# Patient Record
Sex: Female | Born: 1950 | Race: White | Hispanic: No | Smoking: Former smoker
Health system: Southern US, Community
[De-identification: ages and names within clinical notes are randomized; demographics above are authoritative.]

## PROBLEM LIST (undated history)

## (undated) DIAGNOSIS — M1990 Unspecified osteoarthritis, unspecified site: Secondary | ICD-10-CM

## (undated) DIAGNOSIS — E119 Type 2 diabetes mellitus without complications: Secondary | ICD-10-CM

## (undated) DIAGNOSIS — R011 Cardiac murmur, unspecified: Secondary | ICD-10-CM

## (undated) DIAGNOSIS — F419 Anxiety disorder, unspecified: Secondary | ICD-10-CM

## (undated) DIAGNOSIS — E079 Disorder of thyroid, unspecified: Secondary | ICD-10-CM

## (undated) DIAGNOSIS — K5900 Constipation, unspecified: Secondary | ICD-10-CM

## (undated) DIAGNOSIS — R7303 Prediabetes: Secondary | ICD-10-CM

## (undated) DIAGNOSIS — G4730 Sleep apnea, unspecified: Secondary | ICD-10-CM

## (undated) DIAGNOSIS — T63301A Toxic effect of unspecified spider venom, accidental (unintentional), initial encounter: Secondary | ICD-10-CM

## (undated) DIAGNOSIS — F32A Depression, unspecified: Secondary | ICD-10-CM

## (undated) DIAGNOSIS — I10 Essential (primary) hypertension: Secondary | ICD-10-CM

## (undated) DIAGNOSIS — M549 Dorsalgia, unspecified: Secondary | ICD-10-CM

## (undated) DIAGNOSIS — R1310 Dysphagia, unspecified: Secondary | ICD-10-CM

## (undated) DIAGNOSIS — R609 Edema, unspecified: Secondary | ICD-10-CM

## (undated) DIAGNOSIS — K829 Disease of gallbladder, unspecified: Secondary | ICD-10-CM

## (undated) DIAGNOSIS — E785 Hyperlipidemia, unspecified: Secondary | ICD-10-CM

## (undated) DIAGNOSIS — R933 Abnormal findings on diagnostic imaging of other parts of digestive tract: Secondary | ICD-10-CM

## (undated) DIAGNOSIS — F329 Major depressive disorder, single episode, unspecified: Secondary | ICD-10-CM

## (undated) HISTORY — DX: Major depressive disorder, single episode, unspecified: F32.9

## (undated) HISTORY — DX: Edema, unspecified: R60.9

## (undated) HISTORY — DX: Dorsalgia, unspecified: M54.9

## (undated) HISTORY — DX: Sleep apnea, unspecified: G47.30

## (undated) HISTORY — DX: Prediabetes: R73.03

## (undated) HISTORY — DX: Toxic effect of unspecified spider venom, accidental (unintentional), initial encounter: T63.301A

## (undated) HISTORY — PX: OTHER SURGICAL HISTORY: SHX169

## (undated) HISTORY — DX: Hyperlipidemia, unspecified: E78.5

## (undated) HISTORY — DX: Anxiety disorder, unspecified: F41.9

## (undated) HISTORY — DX: Unspecified osteoarthritis, unspecified site: M19.90

## (undated) HISTORY — DX: Type 2 diabetes mellitus without complications: E11.9

## (undated) HISTORY — DX: Disorder of thyroid, unspecified: E07.9

## (undated) HISTORY — DX: Dysphagia, unspecified: R13.10

## (undated) HISTORY — DX: Disease of gallbladder, unspecified: K82.9

## (undated) HISTORY — PX: TONSILLECTOMY: SUR1361

## (undated) HISTORY — DX: Constipation, unspecified: K59.00

## (undated) HISTORY — DX: Essential (primary) hypertension: I10

## (undated) HISTORY — DX: Depression, unspecified: F32.A

## (undated) HISTORY — DX: Cardiac murmur, unspecified: R01.1

## (undated) HISTORY — DX: Abnormal findings on diagnostic imaging of other parts of digestive tract: R93.3

## (undated) HISTORY — PX: DILATION AND CURETTAGE OF UTERUS: SHX78

## (undated) HISTORY — PX: CHOLECYSTECTOMY: SHX55

---

## 1998-04-28 ENCOUNTER — Other Ambulatory Visit: Admission: RE | Admit: 1998-04-28 | Discharge: 1998-04-28 | Payer: Self-pay | Admitting: *Deleted

## 1999-02-10 ENCOUNTER — Encounter: Admission: RE | Admit: 1999-02-10 | Discharge: 1999-02-10 | Payer: Self-pay | Admitting: *Deleted

## 1999-06-29 ENCOUNTER — Other Ambulatory Visit: Admission: RE | Admit: 1999-06-29 | Discharge: 1999-06-29 | Payer: Self-pay | Admitting: *Deleted

## 2000-07-03 ENCOUNTER — Other Ambulatory Visit: Admission: RE | Admit: 2000-07-03 | Discharge: 2000-07-03 | Payer: Self-pay | Admitting: *Deleted

## 2000-07-30 ENCOUNTER — Encounter: Admission: RE | Admit: 2000-07-30 | Discharge: 2000-07-30 | Payer: Self-pay | Admitting: *Deleted

## 2001-02-27 ENCOUNTER — Ambulatory Visit (HOSPITAL_COMMUNITY): Admission: RE | Admit: 2001-02-27 | Discharge: 2001-02-27 | Payer: Self-pay | Admitting: *Deleted

## 2001-02-27 ENCOUNTER — Encounter (INDEPENDENT_AMBULATORY_CARE_PROVIDER_SITE_OTHER): Payer: Self-pay | Admitting: Specialist

## 2001-07-18 ENCOUNTER — Other Ambulatory Visit: Admission: RE | Admit: 2001-07-18 | Discharge: 2001-07-18 | Payer: Self-pay | Admitting: *Deleted

## 2001-08-06 ENCOUNTER — Encounter: Admission: RE | Admit: 2001-08-06 | Discharge: 2001-08-06 | Payer: Self-pay | Admitting: *Deleted

## 2002-09-03 ENCOUNTER — Encounter: Admission: RE | Admit: 2002-09-03 | Discharge: 2002-09-03 | Payer: Self-pay | Admitting: Internal Medicine

## 2002-09-03 ENCOUNTER — Encounter: Payer: Self-pay | Admitting: Internal Medicine

## 2004-10-20 ENCOUNTER — Other Ambulatory Visit: Admission: RE | Admit: 2004-10-20 | Discharge: 2004-10-20 | Payer: Self-pay | Admitting: Family Medicine

## 2004-10-20 ENCOUNTER — Ambulatory Visit: Payer: Self-pay | Admitting: Family Medicine

## 2004-11-15 ENCOUNTER — Ambulatory Visit: Payer: Self-pay | Admitting: Internal Medicine

## 2005-01-23 ENCOUNTER — Ambulatory Visit: Payer: Self-pay | Admitting: Family Medicine

## 2005-07-17 ENCOUNTER — Ambulatory Visit: Payer: Self-pay | Admitting: Family Medicine

## 2005-07-23 ENCOUNTER — Ambulatory Visit: Payer: Self-pay | Admitting: Family Medicine

## 2005-10-03 ENCOUNTER — Other Ambulatory Visit: Admission: RE | Admit: 2005-10-03 | Discharge: 2005-10-03 | Payer: Self-pay | Admitting: Family Medicine

## 2005-10-03 ENCOUNTER — Encounter: Payer: Self-pay | Admitting: Family Medicine

## 2005-10-03 ENCOUNTER — Ambulatory Visit: Payer: Self-pay | Admitting: Family Medicine

## 2006-04-17 ENCOUNTER — Ambulatory Visit: Payer: Self-pay | Admitting: Family Medicine

## 2006-05-14 ENCOUNTER — Ambulatory Visit: Payer: Self-pay | Admitting: Family Medicine

## 2006-05-14 LAB — CONVERTED CEMR LAB
Basophils Absolute: 0.1 10*3/uL (ref 0.0–0.1)
Basophils Relative: 1.1 % — ABNORMAL HIGH (ref 0.0–1.0)
CO2: 32 meq/L (ref 19–32)
Chloride: 103 meq/L (ref 96–112)
Creatinine, Ser: 0.7 mg/dL (ref 0.4–1.2)
Eosinophils Relative: 4.8 % (ref 0.0–5.0)
HCT: 40.7 % (ref 36.0–46.0)
Hemoglobin: 14 g/dL (ref 12.0–15.0)
MCHC: 34.5 g/dL (ref 30.0–36.0)
Monocytes Absolute: 0.3 10*3/uL (ref 0.2–0.7)
Neutrophils Relative %: 50.8 % (ref 43.0–77.0)
Potassium: 4.4 meq/L (ref 3.5–5.1)
RBC: 4.55 M/uL (ref 3.87–5.11)
RDW: 12.5 % (ref 11.5–14.6)
Sodium: 141 meq/L (ref 135–145)
TSH: 8.84 microintl units/mL — ABNORMAL HIGH (ref 0.35–5.50)
WBC: 5.6 10*3/uL (ref 4.5–10.5)

## 2006-05-23 ENCOUNTER — Encounter: Payer: Self-pay | Admitting: Family Medicine

## 2006-05-23 ENCOUNTER — Ambulatory Visit: Payer: Self-pay | Admitting: Family Medicine

## 2006-05-23 DIAGNOSIS — R42 Dizziness and giddiness: Secondary | ICD-10-CM | POA: Insufficient documentation

## 2006-05-23 DIAGNOSIS — E039 Hypothyroidism, unspecified: Secondary | ICD-10-CM

## 2006-05-23 DIAGNOSIS — G4733 Obstructive sleep apnea (adult) (pediatric): Secondary | ICD-10-CM

## 2006-05-23 HISTORY — DX: Dizziness and giddiness: R42

## 2006-05-23 HISTORY — DX: Hypothyroidism, unspecified: E03.9

## 2006-05-23 HISTORY — DX: Obstructive sleep apnea (adult) (pediatric): G47.33

## 2006-05-29 ENCOUNTER — Encounter: Admission: RE | Admit: 2006-05-29 | Discharge: 2006-05-29 | Payer: Self-pay | Admitting: Family Medicine

## 2006-06-12 ENCOUNTER — Encounter: Admission: RE | Admit: 2006-06-12 | Discharge: 2006-07-30 | Payer: Self-pay | Admitting: Orthopedic Surgery

## 2006-09-05 ENCOUNTER — Ambulatory Visit: Payer: Self-pay | Admitting: Family Medicine

## 2006-09-05 DIAGNOSIS — F329 Major depressive disorder, single episode, unspecified: Secondary | ICD-10-CM | POA: Insufficient documentation

## 2006-09-05 DIAGNOSIS — F3289 Other specified depressive episodes: Secondary | ICD-10-CM | POA: Insufficient documentation

## 2006-09-05 HISTORY — DX: Other specified depressive episodes: F32.89

## 2006-09-30 ENCOUNTER — Ambulatory Visit: Payer: Self-pay | Admitting: Gastroenterology

## 2007-04-03 ENCOUNTER — Ambulatory Visit: Payer: Self-pay | Admitting: Family Medicine

## 2007-04-03 ENCOUNTER — Other Ambulatory Visit: Admission: RE | Admit: 2007-04-03 | Discharge: 2007-04-03 | Payer: Self-pay | Admitting: Family Medicine

## 2007-04-03 ENCOUNTER — Encounter: Payer: Self-pay | Admitting: Family Medicine

## 2007-04-04 ENCOUNTER — Encounter: Payer: Self-pay | Admitting: Family Medicine

## 2007-04-14 ENCOUNTER — Encounter (INDEPENDENT_AMBULATORY_CARE_PROVIDER_SITE_OTHER): Payer: Self-pay | Admitting: *Deleted

## 2007-05-16 ENCOUNTER — Emergency Department (HOSPITAL_COMMUNITY): Admission: EM | Admit: 2007-05-16 | Discharge: 2007-05-16 | Payer: Self-pay | Admitting: Family Medicine

## 2007-05-27 ENCOUNTER — Encounter: Payer: Self-pay | Admitting: Family Medicine

## 2007-06-02 ENCOUNTER — Telehealth (INDEPENDENT_AMBULATORY_CARE_PROVIDER_SITE_OTHER): Payer: Self-pay | Admitting: *Deleted

## 2007-07-18 ENCOUNTER — Ambulatory Visit: Payer: Self-pay | Admitting: Family Medicine

## 2007-07-27 LAB — CONVERTED CEMR LAB
ALT: 25 U/L
AST: 31 U/L
Albumin: 3.8 g/dL
Alkaline Phosphatase: 70 U/L
BUN: 11 mg/dL
Bilirubin, Direct: 0.1 mg/dL
CO2: 32 meq/L
Calcium: 9 mg/dL
Chloride: 99 meq/L
Cholesterol: 249 mg/dL
Creatinine, Ser: 0.8 mg/dL
Direct LDL: 173.9 mg/dL
GFR calc Af Amer: 95 mL/min
GFR calc non Af Amer: 79 mL/min
Glucose, Bld: 122 mg/dL — ABNORMAL HIGH
HDL: 27.4 mg/dL — ABNORMAL LOW
Hgb A1c MFr Bld: 6.3 % — ABNORMAL HIGH
Potassium: 3.3 meq/L — ABNORMAL LOW
Sodium: 138 meq/L
Total Bilirubin: 0.7 mg/dL
Total CHOL/HDL Ratio: 9.1
Total Protein: 6.9 g/dL
Triglycerides: 199 mg/dL — ABNORMAL HIGH
VLDL: 40 mg/dL

## 2007-07-28 ENCOUNTER — Encounter (INDEPENDENT_AMBULATORY_CARE_PROVIDER_SITE_OTHER): Payer: Self-pay | Admitting: *Deleted

## 2007-09-03 ENCOUNTER — Encounter: Payer: Self-pay | Admitting: Family Medicine

## 2007-10-14 ENCOUNTER — Telehealth (INDEPENDENT_AMBULATORY_CARE_PROVIDER_SITE_OTHER): Payer: Self-pay | Admitting: *Deleted

## 2008-01-05 ENCOUNTER — Telehealth (INDEPENDENT_AMBULATORY_CARE_PROVIDER_SITE_OTHER): Payer: Self-pay | Admitting: *Deleted

## 2008-03-30 ENCOUNTER — Ambulatory Visit: Payer: Self-pay | Admitting: Family Medicine

## 2008-03-30 HISTORY — DX: Morbid (severe) obesity due to excess calories: E66.01

## 2008-04-07 ENCOUNTER — Telehealth (INDEPENDENT_AMBULATORY_CARE_PROVIDER_SITE_OTHER): Payer: Self-pay | Admitting: *Deleted

## 2008-04-12 ENCOUNTER — Encounter (INDEPENDENT_AMBULATORY_CARE_PROVIDER_SITE_OTHER): Payer: Self-pay | Admitting: *Deleted

## 2008-04-27 ENCOUNTER — Other Ambulatory Visit: Admission: RE | Admit: 2008-04-27 | Discharge: 2008-04-27 | Payer: Self-pay | Admitting: Family Medicine

## 2008-04-27 ENCOUNTER — Ambulatory Visit: Payer: Self-pay | Admitting: Family Medicine

## 2008-04-27 ENCOUNTER — Encounter: Payer: Self-pay | Admitting: Family Medicine

## 2008-04-29 ENCOUNTER — Encounter (INDEPENDENT_AMBULATORY_CARE_PROVIDER_SITE_OTHER): Payer: Self-pay | Admitting: *Deleted

## 2008-05-01 ENCOUNTER — Encounter: Payer: Self-pay | Admitting: Family Medicine

## 2008-05-03 ENCOUNTER — Ambulatory Visit: Payer: Self-pay | Admitting: Gastroenterology

## 2008-05-04 ENCOUNTER — Telehealth (INDEPENDENT_AMBULATORY_CARE_PROVIDER_SITE_OTHER): Payer: Self-pay | Admitting: *Deleted

## 2008-05-04 ENCOUNTER — Encounter (INDEPENDENT_AMBULATORY_CARE_PROVIDER_SITE_OTHER): Payer: Self-pay | Admitting: *Deleted

## 2008-06-16 ENCOUNTER — Encounter: Payer: Self-pay | Admitting: Family Medicine

## 2008-06-25 ENCOUNTER — Ambulatory Visit (HOSPITAL_COMMUNITY): Admission: RE | Admit: 2008-06-25 | Discharge: 2008-06-25 | Payer: Self-pay | Admitting: Surgery

## 2008-07-07 ENCOUNTER — Encounter: Admission: RE | Admit: 2008-07-07 | Discharge: 2008-09-30 | Payer: Self-pay | Admitting: Surgery

## 2008-07-08 ENCOUNTER — Encounter: Payer: Self-pay | Admitting: Family Medicine

## 2008-07-28 ENCOUNTER — Encounter: Payer: Self-pay | Admitting: Family Medicine

## 2008-10-05 ENCOUNTER — Ambulatory Visit: Payer: Self-pay | Admitting: Family Medicine

## 2008-11-01 ENCOUNTER — Ambulatory Visit (HOSPITAL_COMMUNITY): Admission: RE | Admit: 2008-11-01 | Discharge: 2008-11-02 | Payer: Self-pay | Admitting: Surgery

## 2008-11-15 ENCOUNTER — Encounter: Admission: RE | Admit: 2008-11-15 | Discharge: 2009-02-13 | Payer: Self-pay | Admitting: Surgery

## 2009-03-08 ENCOUNTER — Encounter: Payer: Self-pay | Admitting: Family Medicine

## 2009-04-01 ENCOUNTER — Encounter: Payer: Self-pay | Admitting: Family Medicine

## 2009-04-26 ENCOUNTER — Encounter: Admission: RE | Admit: 2009-04-26 | Discharge: 2009-04-26 | Payer: Self-pay | Admitting: Surgery

## 2009-05-17 ENCOUNTER — Ambulatory Visit: Payer: Self-pay | Admitting: Family Medicine

## 2009-05-25 ENCOUNTER — Telehealth: Payer: Self-pay | Admitting: Family Medicine

## 2009-06-01 ENCOUNTER — Ambulatory Visit: Payer: Self-pay | Admitting: Family Medicine

## 2009-06-01 DIAGNOSIS — R5383 Other fatigue: Secondary | ICD-10-CM

## 2009-06-01 DIAGNOSIS — R5381 Other malaise: Secondary | ICD-10-CM

## 2009-06-01 HISTORY — DX: Other malaise: R53.81

## 2009-06-02 ENCOUNTER — Encounter: Payer: Self-pay | Admitting: Family Medicine

## 2009-06-06 LAB — CONVERTED CEMR LAB
BUN: 13 mg/dL (ref 6–23)
Basophils Absolute: 0 10*3/uL (ref 0.0–0.1)
Chloride: 103 meq/L (ref 96–112)
Creatinine, Ser: 0.6 mg/dL (ref 0.4–1.2)
Eosinophils Absolute: 0.1 10*3/uL (ref 0.0–0.7)
Eosinophils Relative: 1.8 % (ref 0.0–5.0)
Glucose, Bld: 101 mg/dL — ABNORMAL HIGH (ref 70–99)
MCV: 89.2 fL (ref 78.0–100.0)
Monocytes Absolute: 0.4 10*3/uL (ref 0.1–1.0)
Neutrophils Relative %: 72.1 % (ref 43.0–77.0)
Platelets: 240 10*3/uL (ref 150.0–400.0)
RDW: 14.1 % (ref 11.5–14.6)
T3, Free: 2.5 pg/mL (ref 2.3–4.2)
TSH: 1.29 microintl units/mL (ref 0.35–5.50)
Vitamin B-12: 839 pg/mL (ref 211–911)
WBC: 7.8 10*3/uL (ref 4.5–10.5)

## 2009-07-27 ENCOUNTER — Encounter: Admission: RE | Admit: 2009-07-27 | Discharge: 2009-07-27 | Payer: Self-pay | Admitting: Surgery

## 2009-10-26 ENCOUNTER — Encounter: Admission: RE | Admit: 2009-10-26 | Discharge: 2009-11-18 | Payer: Self-pay | Admitting: Surgery

## 2009-11-10 ENCOUNTER — Encounter: Payer: Self-pay | Admitting: Family Medicine

## 2010-01-22 ENCOUNTER — Emergency Department (HOSPITAL_BASED_OUTPATIENT_CLINIC_OR_DEPARTMENT_OTHER): Admission: EM | Admit: 2010-01-22 | Discharge: 2010-01-22 | Payer: Self-pay | Admitting: Emergency Medicine

## 2010-01-24 ENCOUNTER — Encounter
Admission: RE | Admit: 2010-01-24 | Discharge: 2010-03-21 | Payer: Self-pay | Source: Home / Self Care | Attending: Surgery | Admitting: Surgery

## 2010-03-12 ENCOUNTER — Encounter: Payer: Self-pay | Admitting: Internal Medicine

## 2010-03-17 ENCOUNTER — Telehealth (INDEPENDENT_AMBULATORY_CARE_PROVIDER_SITE_OTHER): Payer: Self-pay | Admitting: *Deleted

## 2010-03-19 LAB — CONVERTED CEMR LAB
ALT: 23 units/L (ref 0–35)
Albumin: 4.1 g/dL (ref 3.5–5.2)
Albumin: 4.1 g/dL (ref 3.5–5.2)
Alkaline Phosphatase: 75 units/L (ref 39–117)
Alkaline Phosphatase: 75 units/L (ref 39–117)
BUN: 13 mg/dL (ref 6–23)
BUN: 13 mg/dL (ref 6–23)
Basophils Relative: 0.5 % (ref 0.0–3.0)
Bilirubin Urine: NEGATIVE
Bilirubin Urine: NEGATIVE
Bilirubin, Direct: 0.1 mg/dL (ref 0.0–0.3)
Blood in Urine, dipstick: NEGATIVE
Calcium: 9 mg/dL (ref 8.4–10.5)
Calcium: 9.1 mg/dL (ref 8.4–10.5)
Chloride: 103 meq/L (ref 96–112)
Cholesterol: 250 mg/dL (ref 0–200)
Cholesterol: 280 mg/dL (ref 0–200)
Eosinophils Absolute: 0.3 10*3/uL (ref 0.0–0.6)
Eosinophils Relative: 4.5 % (ref 0.0–5.0)
GFR calc Af Amer: 83 mL/min
GFR calc Af Amer: 95 mL/min
GFR calc non Af Amer: 69 mL/min
Glucose, Bld: 117 mg/dL — ABNORMAL HIGH (ref 70–99)
HCT: 43.8 % (ref 36.0–46.0)
HDL: 33.2 mg/dL — ABNORMAL LOW (ref >39.0)
Hemoglobin: 14.9 g/dL (ref 12.0–15.0)
Ketones, urine, test strip: NEGATIVE
Lymphocytes Relative: 38.1 % (ref 12.0–46.0)
MCV: 90.9 fL (ref 78.0–100.0)
Monocytes Absolute: 0.4 10*3/uL (ref 0.1–1.0)
Monocytes Relative: 6.5 % (ref 3.0–12.0)
Monocytes Relative: 6.7 % (ref 3.0–11.0)
Neutro Abs: 3.2 10*3/uL (ref 1.4–7.7)
Neutro Abs: 3.3 10*3/uL (ref 1.4–7.7)
Nitrite: NEGATIVE
Platelets: 240 10*3/uL (ref 150–400)
RBC: 4.66 M/uL (ref 3.87–5.11)
Specific Gravity, Urine: <1.005
TSH: 4.1 microintl units/mL (ref 0.35–5.50)
Total CHOL/HDL Ratio: 8.3
Total Protein: 7.2 g/dL (ref 6.0–8.3)
Triglycerides: 204 mg/dL (ref 0–149)
Urobilinogen, UA: NEGATIVE
Urobilinogen, UA: NEGATIVE
VLDL: 41 mg/dL — ABNORMAL HIGH (ref 0–40)
WBC: 6.5 10*3/uL (ref 4.5–10.5)

## 2010-03-23 NOTE — Letter (Signed)
Summary: [facility]   Imported By: Lanelle Bal 06/27/2009 13:18:27  _____________________________________________________________________  External Attachment:    Type:   Image     Comment:   External Document

## 2010-03-23 NOTE — Assessment & Plan Note (Signed)
Summary: fu on meds/kdc   Vital Signs:  Patient profile:   60 year old female Height:      65.50 inches Weight:      242 pounds BMI:     39.80 Pulse rate:   88 / minute Pulse rhythm:   regular BP sitting:   126 / 82  (left arm) Cuff size:   large  Vitals Entered By: Army Fossa CMA (May 17, 2009 12:46 PM) CC: Pt here to follow up on meds, would like to restart effexor , Depressive symptoms   History of Present Illness: Pt here to discuss restarting effexor.  Pt weaned off of it prior to lap band surgery but is crying a lot for no reason.  No specific trigger.    Depressive symptoms      This is a 60 year old woman who presents with Depressive symptoms.  The patient reports depressed mood, but denies loss of interest/pleasure, significant weight loss, significant weight gain, insomnia, hypersomnia, psychomotor agitation, and psychomotor retardation.  The patient also reports fatigue or loss of energy.  The patient denies feelings of worthlessness, diminished concentration, indecisiveness, thoughts of death, thoughts of suicide, suicidal intent, and suicidal plans.  The patient reports the following psychosocial stressors: major life changes.  Patient's past history includes depression.  The patient denies abnormally elevated mood, abnormally irritable mood, decreased need for sleep, increased talkativeness, distractibility, flight of ideas, increased goal-directed activity, and inflated self-esteem/ grandiosity.    Current Medications (verified): 1)  Effexor Xr 37.5 Mg Xr24h-Cap (Venlafaxine Hcl) .... 2 By Mouth Once Daily For 2 Weeks Then 1 By Mouth Once Daily Until Gone 2)  Synthroid 137 Mcg  Tabs (Levothyroxine Sodium) .[name].. 1 Once Daily. Needs Labwork. 3)  Mvi .[name].. 1 Once Daily 4)  Calcium Plus D .... 500mg  Two Times A Day 5)  Crestor 10 Mg Tabs (Rosuvastatin Calcium) .... Take One Tablet Daily. 6)  Effexor Xr 37.5 Mg Xr24h-Cap (Venlafaxine Hcl) .[name].. 1 By Mouth Once Daily For 1  Week Then 2 By Mouth Once Daily For 1 Week Then 3 By Mouth Once Daily For 2 Weeks  Allergies (verified): No Known Drug Allergies  Past History:  Past medical, surgical, family and social histories (including risk factors) reviewed for relevance to current acute and chronic problems.  Past Medical History: Reviewed history from 04/03/2007 and no changes required. Hypothyroidism Depression  Past Surgical History: Reviewed history from 05/23/2006 and no changes required. Cholecystectomy Hysterectomy Tonsillectomy  Family History: Reviewed history from 04/03/2007 and no changes required. Family History Depression Family History High cholesterol Family History of Arthritis M aunt--DM FGM--Breast CA Maunt--lung CA nonsmoker Maunt--Breast CA Family History Psychiatric care----Maunt----electroshock treatments  Social History: Reviewed history from 04/03/2007 and no changes required. Occupation: Biochemist, clinical. Divorced Never Smoked Alcohol use-yes Drug use-no Regular exercise-yes  Review of Systems      See HPI  Physical Exam  General:  Well-developed,well-nourished,in no acute distress; alert,appropriate and cooperative throughout examination Psych:  Oriented X3, memory intact for recent and remote, normally interactive, and tearful.     Impression & Recommendations:  Problem # 1:  DEPRESSION (ICD-311)  The following medications were removed from the medication list:    Effexor Xr 37.5 Mg Xr24h-cap (Venlafaxine hcl) .[name]... 2 by mouth once daily for 2 weeks then 1 by mouth once daily until gone Her updated medication list for this problem includes:    Effexor Xr 37.5 Mg Xr24h-cap (Venlafaxine hcl) .[name]... 1 by mouth once  daily for 1 week then 2 by mouth once daily for 1 week then 3 by mouth once daily for 2 weeks  Discussed treatment options, including trial of antidpressant medication. Will refer to behavioral health. Follow-up call in in  24-48 hours and recheck in 2 weeks, sooner as needed. Patient agrees to call if any worsening of symptoms or thoughts of doing harm arise. Verified that the patient has no suicidal ideation at this time.   Complete Medication List: 1)  Synthroid 137 Mcg Tabs (Levothyroxine sodium) .[name].. 1 once daily. needs labwork. 2)  Mvi  .[name].. 1 once daily 3)  Calcium Plus D  .... 500mg  two times a day 4)  Crestor 10 Mg Tabs (Rosuvastatin calcium) .... Take one tablet daily. 5)  Effexor Xr 37.5 Mg Xr24h-cap (Venlafaxine hcl) .[name].. 1 by mouth once daily for 1 week then 2 by mouth once daily for 1 week then 3 by mouth once daily for 2 weeks Prescriptions: SYNTHROID 137 MCG  TABS (LEVOTHYROXINE SODIUM) 1 once daily. NEEDS LABWORK.  #90 x 3   Entered and Authorized by:   Loreen Freud DO   Signed by:   Loreen Freud DO on 05/17/2009   Method used:   Print then Give to Patient   RxID:   [id number] CRESTOR 10 MG TABS (ROSUVASTATIN CALCIUM) Take one tablet daily.  #90 x 3   Entered and Authorized by:   Loreen Freud DO   Signed by:   Loreen Freud DO on 05/17/2009   Method used:   Print then Give to Patient   RxID:   [id number] SYNTHROID 137 MCG  TABS (LEVOTHYROXINE SODIUM) 1 once daily. NEEDS LABWORK.  #90 x 3   Entered and Authorized by:   Loreen Freud DO   Signed by:   Loreen Freud DO on 05/17/2009   Method used:   [facility] to        [facility] [phone][facility] (retail)       [address]       [place], [place]  [address]       Ph: [phone]       Fax: [phone]   RxID:   [id number] EFFEXOR XR 37.5 MG XR24H-CAP (VENLAFAXINE HCL) 1 by mouth once daily for 1 week then 2 by mouth once daily for 1 week then 3 by mouth once daily for 2 weeks  #1 month x 0   Entered and Authorized by:   Loreen Freud DO   Signed by:   Loreen Freud DO on 05/17/2009   Method used:   [facility]ronically to        [facility] [phone]* (retail)       [address]        [place], [place]  [address]       Ph: [phone]       Fax: [phone]   RxID:   [id number]

## 2010-03-23 NOTE — Progress Notes (Signed)
Summary: rx for effexor  Phone Note Call from Patient Call back at [phone]   Caller: Patient- msg on voicemail Summary of Call: patient left msg on voicemail say she is tolerating medication well and would like to get prescription sent to pharmacy for medication .Marland KitchenMarland KitchenDoristine Devoid  May 25, 2009 4:50 PM   [facility]  Follow-up for Phone Call        rx sent Follow-up by: Loreen Freud DO,  May 26, 2009 10:55 AM  Additional Follow-up for Phone Call Additional follow up Details #1::        Pt is aware. Army Fossa CMA  May 26, 2009 11:07 AM     New/Updated Medications: EFFEXOR XR 37.5 MG XR24H-CAP (VENLAFAXINE HCL) 3 by mouth once daily Prescriptions: EFFEXOR XR 37.5 MG XR24H-CAP (VENLAFAXINE HCL) 3 by mouth once daily  #90 x 5   Entered and Authorized by:   Loreen Freud DO   Signed by:   Loreen Freud DO on 05/26/2009   Method used:   [facility]ronically to        [facility] [phone]* (retail)       [address]       [place], [place]  [address]       Ph: [phone]       Fax: [phone]   RxID:   [id number]

## 2010-03-23 NOTE — Progress Notes (Signed)
Summary: refill  Phone Note Refill Request Call back at Home Phone [phone] Message from:  Patient  Refills Requested: Medication #1:  SYNTHROID 137 MCG  TABS 1 once daily. NEEDS LABWORK.   Supply Requested: 1 month Initial call taken by: Jeremy Johann CMA,  March 17, 2010 3:27 PM    Prescriptions: SYNTHROID 137 MCG  TABS (LEVOTHYROXINE SODIUM) 1 once daily. NEEDS LABWORK.  #30 x 0   Entered by:   Jeremy Johann CMA   Authorized by:   Loreen Freud DO   Signed by:   Jeremy Johann CMA on 03/17/2010   Method used:   Faxed to ...       [facility] [phone]* (retail)       [address]       [place], [place]  [address]       Ph: [phone]       Fax: [phone]   RxID:   [id number]

## 2010-03-23 NOTE — Letter (Signed)
Summary: [facility]   Imported By: Lanelle Bal 12/01/2009 07:57:48  _____________________________________________________________________  External Attachment:    Type:   Image     Comment:   External Document

## 2010-03-23 NOTE — Assessment & Plan Note (Signed)
Summary: fatigue, loss appetite//fd   Vital Signs:  Patient profile:   60 year old female Height:      65.5 inches Weight:      231.6 pounds BMI:     38.09 Temp:     98 degrees F BP sitting:   130 / 80  Vitals Entered By: R.Peeler CC: fatigue/ loss appetite, Depressive symptoms Comments since started effexor last wk-no appetite, tired   History of Present Illness:  Depressive symptoms      This is a 60 year old woman who presents with Depressive symptoms.  The symptoms began 3 weeks ago.  Pt started back on effexor 2 weeks ago.  The patient reports depressed mood, loss of interest/pleasure, and hypersomnia.  The patient also reports fatigue or loss of energy and diminished concentration.  The patient denies feelings of worthlessness, indecisiveness, thoughts of death, thoughts of suicide, suicidal intent, and suicidal plans.  The patient reports the following psychosocial stressors: major life changes.  Patient's past history includes depression.  The patient denies abnormally elevated mood, abnormally irritable mood, decreased need for sleep, increased talkativeness, distractibility, flight of ideas, increased goal-directed activity, and inflated self-esteem/ grandiosity.    Allergies: No Known Drug Allergies  Past History:  Past Medical History: Last updated: 04/03/2007 Hypothyroidism Depression  Past Surgical History: Last updated: 05/23/2006 Cholecystectomy Hysterectomy Tonsillectomy  Family History: Last updated: 04/03/2007 Family History Depression Family History High cholesterol Family History of Arthritis M aunt--DM FGM--Breast CA Maunt--lung CA nonsmoker Maunt--Breast CA Family History Psychiatric care----Maunt----electroshock treatments  Social History: Last updated: 04/03/2007 Occupation: Employee benefits--underwriting and acc exect. Divorced Never Smoked Alcohol use-yes Drug use-no Regular exercise-yes  Risk Factors: Alcohol Use: <1  (04/27/2008) >5 drinks/d w/in last 3 months: no (04/27/2008) Caffeine Use: 2 (04/27/2008) Diet: pt dieting by eating inc protein and low carb (04/27/2008) Exercise: yes (04/27/2008)  Risk Factors: Smoking Status: never (04/27/2008) Passive Smoke Exposure: no (04/27/2008)  Physical Exam  General:  Well-developed,well-nourished,in no acute distress; alert,appropriate and cooperative throughout examination Psych:  Oriented X3, depressed affect, subdued, and tearful.     Impression & Recommendations:  Problem # 1:  DEPRESSION (ICD-311)  Her updated medication list for this problem includes:    Effexor Xr 150 Mg Xr24h-cap (Venlafaxine hcl) .[name]... 1 by mouth once daily    abilify 2 mg once daily   Orders: Venipuncture (04540) TLB-B12 + Folate Pnl (98119_14782-N56/OZH) TLB-BMP (Basic Metabolic Panel-BMET) (80048-METABOL) TLB-CBC Platelet - w/Differential (85025-CBCD) TLB-TSH (Thyroid Stimulating Hormone) (84443-TSH) TLB-T3, Free (Triiodothyronine) (84481-T3FREE) TLB-T4 (Thyrox), Free [phone])  Complete Medication List: 1)  Synthroid 137 Mcg Tabs (Levothyroxine sodium) .[name].. 1 once daily. needs labwork. 2)  Mvi  .[name].. 1 once daily 3)  Calcium Plus D  .... 500mg  two times a day 4)  Crestor 10 Mg Tabs (Rosuvastatin calcium) .... Take one tablet daily. 5)  Effexor Xr 150 Mg Xr24h-cap (Venlafaxine hcl) .[name].. 1 by mouth once daily 6)  Abilify 2 Mg Tabs (Aripiprazole) .[name].. 1 by mouth once daily  Patient Instructions: 1)  rto 3-4 weeks Prescriptions: EFFEXOR XR 150 MG XR24H-CAP (VENLAFAXINE HCL) 1 by mouth once daily  #30 x 5   Entered and Authorized by:   Loreen Freud DO   Signed by:   Loreen Freud DO on 06/01/2009   Method used:   Print then Give to Patient   RxID:   [id number] ABILIFY 2 MG TABS (ARIPIPRAZOLE) 1 by mouth once daily  #30 x 0   Entered and Authorized by:   Myrene Buddy  Lowne DO   Signed by:   Loreen Freud DO on 06/01/2009   Method used:   Print then Give to  Patient   RxID:   [id number]

## 2010-05-26 LAB — CBC
HCT: 35.9 % — ABNORMAL LOW (ref 36.0–46.0)
Hemoglobin: 12.3 g/dL (ref 12.0–15.0)
MCHC: 33.9 g/dL (ref 30.0–36.0)
MCHC: 34.4 g/dL (ref 30.0–36.0)
MCV: 91.2 fL (ref 78.0–100.0)
Platelets: 205 10*3/uL (ref 150–400)
RBC: 3.93 MIL/uL (ref 3.87–5.11)
RBC: 4.53 MIL/uL (ref 3.87–5.11)
RDW: 13.1 % (ref 11.5–15.5)
RDW: 13.2 % (ref 11.5–15.5)

## 2010-05-26 LAB — DIFFERENTIAL
Basophils Absolute: 0 10*3/uL (ref 0.0–0.1)
Eosinophils Absolute: 0.2 10*3/uL (ref 0.0–0.7)
Eosinophils Relative: 1 % (ref 0–5)
Eosinophils Relative: 4 % (ref 0–5)
Lymphocytes Relative: 26 % (ref 12–46)
Lymphocytes Relative: 32 % (ref 12–46)
Lymphs Abs: 1.8 10*3/uL (ref 0.7–4.0)
Monocytes Absolute: 0.5 10*3/uL (ref 0.1–1.0)
Monocytes Relative: 6 % (ref 3–12)
Monocytes Relative: 6 % (ref 3–12)
Neutro Abs: 5.4 10*3/uL (ref 1.7–7.7)

## 2010-05-26 LAB — COMPREHENSIVE METABOLIC PANEL
ALT: 26 U/L (ref 0–35)
AST: 29 U/L (ref 0–37)
Albumin: 4 g/dL (ref 3.5–5.2)
Calcium: 9.5 mg/dL (ref 8.4–10.5)
Creatinine, Ser: 0.75 mg/dL (ref 0.4–1.2)
GFR calc Af Amer: >60 mL/min (ref >60)
Sodium: 139 mEq/L (ref 135–145)
Total Protein: 6.7 g/dL (ref 6.0–8.3)

## 2010-05-30 ENCOUNTER — Other Ambulatory Visit: Payer: Self-pay | Admitting: *Deleted

## 2010-05-30 NOTE — Telephone Encounter (Signed)
Pt is changing job so insurance coverage is changing and she needs a 3 month supply to mail in to her new mail order. Call Pt when Rx ready for pick-up,

## 2010-06-01 NOTE — Telephone Encounter (Signed)
Pt has pending lab appt 06-05-10 will await result before refilling med.

## 2010-06-05 ENCOUNTER — Other Ambulatory Visit (INDEPENDENT_AMBULATORY_CARE_PROVIDER_SITE_OTHER): Payer: BC Managed Care – PPO

## 2010-06-05 DIAGNOSIS — E039 Hypothyroidism, unspecified: Secondary | ICD-10-CM

## 2010-06-05 LAB — T4, FREE: Free T4: 1.4 ng/dL (ref 0.60–1.60)

## 2010-06-05 MED ORDER — LEVOTHYROXINE SODIUM 125 MCG PO TABS: 125.0000 ug | ORAL_TABLET | Freq: Every day | ORAL | Status: DC

## 2010-06-05 NOTE — Telephone Encounter (Signed)
Spoke with patient and made her aware of her results, Adv we will decrease Synthroid to 125 mcg she voiced understanding and would [facility] and also a [facility] supply [facility], she is aware to repeat labs in 2 months      KP

## 2010-06-09 ENCOUNTER — Other Ambulatory Visit: Payer: Self-pay | Admitting: Family Medicine

## 2010-07-04 NOTE — Letter (Signed)
May 03, 2008    Molli Hazard B. Daphine Deutscher, MD  [address], [address]  [place], [place] [address]   RE:  Amy Mahoney, Amy Mahoney  MRN:  [id number]  /  DOB:  [birth year]   Amy Mahoney is a 60 year old white female who presented to my office,  interested in Lap-Band surgery.  She has struggled with weight most of  her life and has researched both Lap-Band and gastric bypass surgery.  She understands the risks involved with the surgery as well as dietary  and lifestyle changes that will be necessary both immediately before and  after surgery.  In 2006, the patient weighed 298.5 pounds with BMI of  47.78.  In 2007, she was 263.4 pounds at a BMI of 43.32.  In 2008, she  was 256.6 pounds with BMI of 43.85.  In 2009, she was 289 pounds with  BMI 47.53.  In 2010, her weight was 285.56 with a BMI of 47.27.  The  patient struggles with depression and hypothyroidism in addition to  morbid obesity.  She has tried Weight Watchers 5 or 6 times over the  last 15 years and has lost 40 pounds but regained all of it back.  She  has tried Sugar Busters in the fall of 2009 and lost 2 pounds, regained  over 4 pounds back.  She has tried low-cal and low-fat diets over the  past several years and has lost 20 to 30 pounds, regaining all this  weight back.  I believe Amy Mahoney is the right candidate for the Lap-  Band surgery.  Please feel free to call to our office with any further  questions.    Sincerely,      Lelon Perla, DO  Electronically Signed    Shawnie Dapper  DD: 05/03/2008  DT: 05/04/2008  Job #: [id number]

## 2010-07-07 NOTE — Op Note (Signed)
[facility]  Patient:    Amy Mahoney, Amy Mahoney Visit Number: [id number] MRN: [id number]          Service Type: END Location: ENDO Attending Physician:  Sabino Gasser Dictated by:   Sabino Gasser, M.D. Proc. Date: 02/27/01 Admit Date:  02/27/2001                             Operative Report  PROCEDURE:  Colonoscopy.  INDICATIONS:  Colon polyp.  ANESTHESIA:  Demerol 70 mg, Versed 5 mg.  DESCRIPTION OF PROCEDURE:  With the patient mildly sedated in the left lateral decubitus position, the Olympus videoscopic colonoscope was inserted in the rectum and passed under direct vision to the cecum, identified by the ileocecal valve and appendiceal orifice. From this point, the colonoscope was slowly withdrawn taking circumferential views of the entire colonic mucosa stopping at approximately 15 cm from the anal verge at which point a small polyp was seen, photographed, and removed using hot biopsy forceps technique. We then withdrew to the rectum, which appeared normal in direct view and showed hemorrhoids in retroflex view. The endoscope was straightened and withdrawn. The patients vital signs and pulse oximeter remained stable. The patient tolerated the procedure well without apparent complications.  FINDINGS:  Polyp at 15 cm, internal hemorrhoids; otherwise unremarkable exam.  PLAN:   The patient will call Dr. Virginia Rochester for results of biopsy and follow up with Dr. Virginia Rochester as an outpatient as needed. Dictated by:   Sabino Gasser, M.D. Attending Physician:  Sabino Gasser DD:  02/27/01 TD:  02/27/01 Job: [id number]

## 2010-07-07 NOTE — Procedures (Signed)
[facility]  Patient:    Amy Mahoney, Amy Mahoney Visit Number: [id number] MRN: [id number]          Service Type: END Location: ENDO Attending Physician:  Sabino Gasser Dictated by:   Sabino Gasser, M.D. Admit Date:  02/27/2001                             Procedure Report  PROCEDURE:  Colonoscopy.  SURGEON:  Sabino Gasser, M.D.  INDICATIONS:  Colon polyp.  ANESTHESIA:  Demerol 70 and Versed 5 mg.  DESCRIPTION OF PROCEDURE:  With the patient mildly sedated in the left lateral decubitus position, the Olympus videoscopic colonoscope was inserted in the rectum, and passed under direct vision to the cecum, identified by the ileocecal valve and appendiceal orifice.  From this point, the colonoscope was slowly withdrawn, taking circumferential views of the entire colonic mucosa, stopping to approximately 15 cm from the anal verge, at which point, a small polyp was seen, photographed, and removed using hot biopsy forceps technique. We then withdrew to the rectum which appeared normal on direct view and showed hemorrhoids on retroflexed view.  The endoscope was straightened and withdrawn.  The patients vital signs and pulse oximeter remained stable.  The patient tolerated the procedure well and without apparent complication.  FINDINGS:  Polyp at 15 cm and internal hemorrhoids, otherwise unremarkable examination.  PLAN:  The patient will call me for results of biopsy and follow up with me as an outpatient as needed. Dictated by:   Sabino Gasser, M.D. Attending Physician:  Sabino Gasser DD:  02/27/01 TD:  02/27/01 Job: [id number]

## 2010-07-10 ENCOUNTER — Other Ambulatory Visit: Payer: Self-pay

## 2010-07-10 MED ORDER — VENLAFAXINE HCL ER 150 MG PO CP24: 150.0000 mg | ORAL_CAPSULE | Freq: Every day | ORAL | Status: DC

## 2010-07-10 NOTE — Telephone Encounter (Signed)
Pt overdue for appointment

## 2010-07-10 NOTE — Telephone Encounter (Signed)
Fill 1 month--pt needs ov

## 2010-07-10 NOTE — Telephone Encounter (Signed)
Last filled 06/09/10 and seen 06/01/09 please advise     KP

## 2010-07-29 ENCOUNTER — Encounter: Payer: Self-pay | Admitting: Family Medicine

## 2010-08-01 ENCOUNTER — Ambulatory Visit (INDEPENDENT_AMBULATORY_CARE_PROVIDER_SITE_OTHER): Payer: BC Managed Care – PPO | Admitting: Family Medicine

## 2010-08-01 ENCOUNTER — Encounter: Payer: Self-pay | Admitting: Family Medicine

## 2010-08-01 ENCOUNTER — Other Ambulatory Visit (HOSPITAL_COMMUNITY)
Admission: RE | Admit: 2010-08-01 | Discharge: 2010-08-01 | Disposition: A | Discharge status: Home / Self Care | Payer: BC Managed Care – PPO | Source: Ambulatory Visit | Attending: Family Medicine | Admitting: Family Medicine

## 2010-08-01 VITALS — BP 120/72 | HR 79 | Temp 98.7°F | Ht 65.5 in | Wt 239.0 lb

## 2010-08-01 DIAGNOSIS — Z01419 Encounter for gynecological examination (general) (routine) without abnormal findings: Secondary | ICD-10-CM | POA: Insufficient documentation

## 2010-08-01 DIAGNOSIS — R319 Hematuria, unspecified: Secondary | ICD-10-CM

## 2010-08-01 DIAGNOSIS — F329 Major depressive disorder, single episode, unspecified: Secondary | ICD-10-CM

## 2010-08-01 DIAGNOSIS — Z0000 Encounter for general adult medical examination without abnormal findings: Secondary | ICD-10-CM

## 2010-08-01 LAB — HEPATIC FUNCTION PANEL
ALT: 21 U/L (ref 0–35)
AST: 24 U/L (ref 0–37)
Albumin: 4.2 g/dL (ref 3.5–5.2)
Alkaline Phosphatase: 77 U/L (ref 39–117)
Bilirubin, Direct: 0.1 mg/dL (ref 0.0–0.3)
Total Bilirubin: 0.5 mg/dL (ref 0.3–1.2)
Total Protein: 6.8 g/dL (ref 6.0–8.3)

## 2010-08-01 LAB — CBC WITH DIFFERENTIAL/PLATELET
Basophils Absolute: 0 10*3/uL (ref 0.0–0.1)
Eosinophils Relative: 7.3 % — ABNORMAL HIGH (ref 0.0–5.0)
Lymphocytes Relative: 33.7 % (ref 12.0–46.0)
Monocytes Relative: 7.4 % (ref 3.0–12.0)
Platelets: 213 10*3/uL (ref 150.0–400.0)
RDW: 13.4 % (ref 11.5–14.6)
WBC: 4.6 10*3/uL (ref 4.5–10.5)

## 2010-08-01 LAB — BASIC METABOLIC PANEL
BUN: 15 mg/dL (ref 6–23)
CO2: 28 mEq/L (ref 19–32)
Calcium: 9 mg/dL (ref 8.4–10.5)
Chloride: 106 mEq/L (ref 96–112)
Creatinine, Ser: 0.7 mg/dL (ref 0.4–1.2)
GFR: 95.17 mL/min (ref >60.00)
Glucose, Bld: 105 mg/dL — ABNORMAL HIGH (ref 70–99)
Potassium: 4.7 mEq/L (ref 3.5–5.1)
Sodium: 139 mEq/L (ref 135–145)

## 2010-08-01 LAB — POCT URINALYSIS DIPSTICK
Bilirubin, UA: NEGATIVE
Glucose, UA: NEGATIVE
Nitrite, UA: NEGATIVE
Urobilinogen, UA: 0.2

## 2010-08-01 LAB — TSH: TSH: 0.05 u[IU]/mL — ABNORMAL LOW (ref 0.35–5.50)

## 2010-08-01 LAB — LIPID PANEL
Cholesterol: 279 mg/dL — ABNORMAL HIGH (ref 0–200)
HDL: 41.1 mg/dL (ref >39.00)
Total CHOL/HDL Ratio: 7
Triglycerides: 178 mg/dL — ABNORMAL HIGH (ref 0.0–149.0)
VLDL: 35.6 mg/dL (ref 0.0–40.0)

## 2010-08-01 MED ORDER — VENLAFAXINE HCL ER 150 MG PO CP24: 150.0000 mg | ORAL_CAPSULE | Freq: Every day | ORAL | Status: DC

## 2010-08-01 MED ORDER — TETANUS-DIPHTH-ACELL PERTUSSIS 5-2.5-18.5 LF-MCG/0.5 IM SUSP: 0.5000 mL | Freq: Once | INTRAMUSCULAR | Status: DC

## 2010-08-01 NOTE — Progress Notes (Signed)
Subjective:     Amy Mahoney is a 60 y.o. female and is here for a comprehensive physical exam. The patient reports no problems.  History   Social History  . Marital Status: Single    Spouse Name: N/A    Number of Children: N/A  . Years of Education: N/A   Occupational History  . [employer]    Social History Main Topics  . Smoking status: Never Smoker   . Smokeless tobacco: Never Used  . Alcohol Use: Yes  . Drug Use: No  . Sexually Active: Not Currently -- Female partner(s)   Other Topics Concern  . Not on file   Social History Narrative  . No narrative on file   Health Maintenance  Topic Date Due  . Pap Smear  10/21/1967  . Tetanus/tdap  10/20/1968  . Mammogram  10/21/1999  . Colonoscopy  10/21/1999  . Zostavax  10/20/2009  . Influenza Vaccine  11/20/2010    The following portions of the patient's history were reviewed and updated as appropriate: allergies, current medications, past family history, past medical history, past social history, past surgical history and problem list.  Review of Systems Review of Systems  Constitutional: Negative for activity change, appetite change and fatigue.  HENT: Negative for hearing loss, congestion, tinnitus and ear discharge.  dentist q18m Eyes: Negative for visual disturbance (see optho q1y -- vision corrected to 20/20 with glasses).  Respiratory: Negative for cough, chest tightness and shortness of breath.   Cardiovascular: Negative for chest pain, palpitations and leg swelling.  Gastrointestinal: Negative for abdominal pain, diarrhea, constipation and abdominal distention.  Genitourinary: Negative for urgency, frequency, decreased urine volume and difficulty urinating.  Musculoskeletal: Negative for back pain, arthralgias and gait problem.  Skin: Negative for color change, pallor and rash.  Neurological: Negative for dizziness, light-headedness, numbness and headaches.  Hematological: Negative for adenopathy. Does not bruise/bleed  easily.  Psychiatric/Behavioral: Negative for suicidal ideas, confusion, sleep disturbance, self-injury, dysphoric mood, decreased concentration and agitation.      Objective:    BP 120/72  Pulse 79  Temp(Src) 98.7 F (37.1 C) (Oral)  Ht 5' 5.5" (1.664 m)  Wt 239 lb (108.41 kg)  BMI 39.17 kg/m2  SpO2 97% General appearance: alert, cooperative, appears stated age, no distress and morbidly obese Head: Normocephalic, without obvious abnormality, atraumatic Eyes: conjunctivae/corneas clear. PERRL, EOM's intact. Fundi benign. Ears: normal TM's and external ear canals both ears Nose: Nares normal. Septum midline. Mucosa normal. No drainage or sinus tenderness. Throat: lips, mucosa, and tongue normal; teeth and gums normal Neck: no adenopathy, no carotid bruit, no JVD, supple, symmetrical, trachea midline and thyroid not enlarged, symmetric, no tenderness/mass/nodules Lungs: clear to auscultation bilaterally Breasts: normal appearance, no masses or tenderness Heart: regular rate and rhythm, S1, S2 normal, no murmur, click, rub or gallop Abdomen: soft, non-tender; bowel sounds normal; no masses,  no organomegaly Pelvic: cervix normal in appearance, external genitalia normal, no adnexal masses or tenderness, no cervical motion tenderness, rectovaginal septum normal, uterus normal size, shape, and consistency and vagina normal without discharge Extremities: extremities normal, atraumatic, no cyanosis or edema Pulses: 2+ and symmetric Skin: Skin color, texture, turgor normal. No rashes or lesions Lymph nodes: Cervical, supraclavicular, and axillary nodes normal. Neurologic: Grossly normal psych--AAOx3,  not suicidal, not depressed    Assessment:    Healthy female exam.   hypothyroidism Sleep apnea depression Plan:  con't meds Check labs Tetanus and zostavax ghm utd    See After Visit  Summary for Counseling Recommendations

## 2010-08-01 NOTE — Patient Instructions (Signed)

## 2010-08-05 ENCOUNTER — Encounter: Payer: Self-pay | Admitting: *Deleted

## 2010-08-07 ENCOUNTER — Telehealth: Payer: Self-pay

## 2010-08-07 MED ORDER — EZETIMIBE 10 MG PO TABS: 10.0000 mg | ORAL_TABLET | Freq: Every day | ORAL | Status: DC

## 2010-08-07 NOTE — Telephone Encounter (Signed)
Discussed results with patient---She stated she was recently decreased to the 125 mcg of synthroid 3 weeks ago, per Dr. Laury Axon  Continue that dose and we will recheck labs in 2 months.... Rx Zetia samples left at check in and Rx [facility].    KP

## 2010-08-07 NOTE — Telephone Encounter (Signed)
Message copied by [name] Norris on Mon Aug 07, 2010  9:08 AM ------      Message from: Lelon Perla      Created: Sat Aug 05, 2010  3:32 PM       Hyperthyroid----dec synthroid to  1 po qd  #30  2 refills---recheck 3 months---244.9  TSH      Cholesterol--- LDL goal < 100,  HDL >40,  TG < 150.  Diet and exercise will increase HDL and decrease LDL and TG.  Fish,  Fish Oil, Flaxseed oil will also help increase the HDL and decrease Triglycerides.   Recheck labs in 3 months.   con't Crestor 20 mg  And add zetia 10 mg  #30  1 po qd ,  2 refills--------272.4  [facility]. Bmp, tsh, lipid, hep----ov 1 month after labs drawn      Pap normal      Urine culture negative

## 2010-08-10 ENCOUNTER — Encounter: Payer: Self-pay | Admitting: Internal Medicine

## 2010-08-10 ENCOUNTER — Ambulatory Visit (INDEPENDENT_AMBULATORY_CARE_PROVIDER_SITE_OTHER): Payer: BC Managed Care – PPO | Admitting: Internal Medicine

## 2010-08-10 VITALS — BP 118/66 | HR 78 | Ht 66.0 in | Wt 240.2 lb

## 2010-08-10 DIAGNOSIS — G4730 Sleep apnea, unspecified: Secondary | ICD-10-CM

## 2010-08-10 NOTE — Patient Instructions (Signed)
Script to carry for replacement CPAP machine, 7 cwp, heated humidifier     Dx OSA

## 2010-08-10 NOTE — Progress Notes (Signed)
  Subjective:    Patient ID: Amy Mahoney, female    DOB: [birth year], 60 y.o.   MRN: [id number]  HPI 08/10/10- 51 yoF former smoker last here in 2006 and returning now to re-establish for sleep apnea. Diagnosed 08/03/94 with NPSG RDI/ AHI 49/hr. In the last year she has lost 50 lbs with diet and exercise. CPAP at 11( [facility]) is working very well, using nasal mask. With this she denies snore. Not sleepy in daytime, except habit of naps on Sunday.  Slee questionnaire reviewed. No sleep meds.  Tonsils out at age 67. Treated hypothyroid.   Review of Systems Constitutional:   No weight loss, night sweats,  Fevers, chills, fatigue, lassitude. HEENT:   No headaches,  Difficulty swallowing,  Tooth/dental problems,  Sore throat,                No sneezing, itching, ear ache, nasal congestion, post nasal drip,   CV:  No chest pain,  Orthopnea, PND, swelling in lower extremities, anasarca, dizziness, palpitations  GI  No heartburn, indigestion, abdominal pain, nausea, vomiting, diarrhea, change in bowel habits, loss of appetite  Resp: No shortness of breath with exertion or at rest.  No excess mucus, no productive cough,  No non-productive cough,  No coughing up of blood.  No change in color of mucus.  No wheezing.   Skin: no rash or lesions.  GU: no dysuria, change in color of urine, no urgency or frequency.  No flank pain.  MS:  No joint pain or swelling.  No decreased range of motion.  No back pain.  Psych:  No change in mood or affect. No depression or anxiety.  No memory loss.      Objective:   Physical Exam General- Alert, Oriented, Affect-appropriate, Distress- none acute. Overweight Skin- rash-none, lesions- none, excoriation- none  Lymphadenopathy- none  Head- atraumatic  Eyes- Gross vision intact, PERRLA, conjunctivae clear secretions  Ears- Hearing, canals, Tm- normal  Nose- Clear, No-Septal dev, mucus, polyps, erosion, perforation   Throat- Mallampati III , mucosa clear ,  drainage- none, tonsils- atrophic  Neck- flexible , trachea midline, no stridor , thyroid nl, carotid no bruit  Chest - symmetrical excursion , unlabored     Heart/CV- RRR , no murmur , no gallop  , no rub, nl s1 s2                     - JVD- none , edema- none, stasis changes- none, varices- none     Lung- clear to P&A, wheeze- none, cough- none , dullness-none, rub- none     Chest wall-   Abd- tender-no, distended-no, bowel sounds-present, HSM- no  Br/ Gen/ Rectal- Not done, not indicated  Extrem- cyanosis- none, clubbing, none, atrophy- none, strength- nl  Neuro- grossly intact to observation         Assessment & Plan:

## 2010-08-10 NOTE — Assessment & Plan Note (Signed)
Excellent compliance and control with CPAP 11 through [facility]. Her machine is quite old and it is time to replace without need for pressure change identified. We discussed CPAP pressure and machine issues.

## 2010-08-16 NOTE — Assessment & Plan Note (Signed)
Actively working down her weight at this time.

## 2010-08-28 ENCOUNTER — Encounter: Payer: Self-pay | Admitting: Internal Medicine

## 2010-09-29 ENCOUNTER — Other Ambulatory Visit: Payer: Self-pay | Admitting: Family Medicine

## 2010-09-29 DIAGNOSIS — E039 Hypothyroidism, unspecified: Secondary | ICD-10-CM

## 2010-09-29 DIAGNOSIS — E785 Hyperlipidemia, unspecified: Secondary | ICD-10-CM

## 2010-10-02 ENCOUNTER — Other Ambulatory Visit: Payer: BC Managed Care – PPO

## 2010-10-02 DIAGNOSIS — Z0289 Encounter for other administrative examinations: Secondary | ICD-10-CM

## 2010-10-02 NOTE — Progress Notes (Signed)
Labs only

## 2010-11-13 ENCOUNTER — Other Ambulatory Visit: Payer: Self-pay | Admitting: Family Medicine

## 2010-11-14 ENCOUNTER — Other Ambulatory Visit: Payer: Self-pay | Admitting: Family Medicine

## 2010-11-14 DIAGNOSIS — E039 Hypothyroidism, unspecified: Secondary | ICD-10-CM

## 2010-11-14 DIAGNOSIS — E785 Hyperlipidemia, unspecified: Secondary | ICD-10-CM

## 2010-11-14 MED ORDER — LEVOTHYROXINE SODIUM 125 MCG PO TABS: 125.0000 ug | ORAL_TABLET | Freq: Every day | ORAL | Status: DC

## 2010-11-14 NOTE — Telephone Encounter (Signed)
Scheduled appt for tomorrow     KP

## 2010-11-14 NOTE — Progress Notes (Signed)
[facility]

## 2010-11-15 ENCOUNTER — Other Ambulatory Visit (INDEPENDENT_AMBULATORY_CARE_PROVIDER_SITE_OTHER): Payer: BC Managed Care – PPO

## 2010-11-15 DIAGNOSIS — E039 Hypothyroidism, unspecified: Secondary | ICD-10-CM

## 2010-11-15 DIAGNOSIS — E785 Hyperlipidemia, unspecified: Secondary | ICD-10-CM

## 2010-11-15 LAB — BASIC METABOLIC PANEL
BUN: 16 mg/dL (ref 6–23)
CO2: 31 mEq/L (ref 19–32)
Glucose, Bld: 119 mg/dL — ABNORMAL HIGH (ref 70–99)
Potassium: 4.7 mEq/L (ref 3.5–5.1)
Sodium: 143 mEq/L (ref 135–145)

## 2010-11-15 LAB — HEPATIC FUNCTION PANEL
ALT: 20 U/L (ref 0–35)
Albumin: 3.7 g/dL (ref 3.5–5.2)
Alkaline Phosphatase: 64 U/L (ref 39–117)
Bilirubin, Direct: 0 mg/dL (ref 0.0–0.3)
Total Protein: 6.4 g/dL (ref 6.0–8.3)

## 2010-11-15 NOTE — Progress Notes (Signed)
Labs only

## 2010-11-28 ENCOUNTER — Telehealth: Payer: Self-pay

## 2010-11-28 MED ORDER — LEVOTHYROXINE SODIUM 112 MCG PO TABS: 112.0000 ug | ORAL_TABLET | Freq: Every day | ORAL | Status: DC

## 2010-11-28 NOTE — Telephone Encounter (Signed)
After several attempts to contact patient a mailed a copy of labs with an Rx      KP

## 2010-11-28 NOTE — Telephone Encounter (Signed)
Message copied by [name] Norris on Tue Nov 28, 2010 11:38 AM ------      Message from: Lelon Perla      Created: Wed Nov 15, 2010  2:09 PM       Hyperthyroid---- decrease synthroid to 112 mcg qd  #30  2 refills---recheck 2 months---244.9  TSH      Glucose elevated----watch simple sugars and starches----790.6  Bmp, hgba1c---- recheck 2 months

## 2010-12-11 ENCOUNTER — Encounter: Payer: Self-pay | Admitting: Family Medicine

## 2010-12-13 ENCOUNTER — Encounter: Payer: Self-pay | Admitting: Family Medicine

## 2010-12-13 ENCOUNTER — Ambulatory Visit (INDEPENDENT_AMBULATORY_CARE_PROVIDER_SITE_OTHER): Payer: BC Managed Care – PPO | Admitting: Family Medicine

## 2010-12-13 VITALS — BP 118/72 | HR 80 | Temp 98.6°F | Wt 204.0 lb

## 2010-12-13 DIAGNOSIS — E785 Hyperlipidemia, unspecified: Secondary | ICD-10-CM

## 2010-12-13 DIAGNOSIS — E039 Hypothyroidism, unspecified: Secondary | ICD-10-CM

## 2010-12-13 MED ORDER — ROSUVASTATIN CALCIUM 20 MG PO TABS: 20.0000 mg | ORAL_TABLET | Freq: Every day | ORAL | Status: DC

## 2010-12-13 NOTE — Patient Instructions (Signed)

## 2010-12-13 NOTE — Progress Notes (Signed)
  Subjective:    Patient ID: Amy Mahoney, female    DOB: 10-26-1950, 60 y.o.   MRN: [id number]  HPI  Pt here to review [facility].  No complaints.  Review of Systems    as above Objective:   Physical Exam  Constitutional: She is oriented to person, place, and time. She appears well-developed and well-nourished.  Neurological: She is alert and oriented to person, place, and time.  Psychiatric: She has a normal mood and affect. Her behavior is normal.          Assessment & Plan:  Hyperlipidemia---  Inc crestor to 20 mg , con't zetia and repeat labs 2 months

## 2010-12-18 ENCOUNTER — Encounter: Payer: Self-pay | Admitting: Family Medicine

## 2011-02-08 ENCOUNTER — Other Ambulatory Visit: Payer: Self-pay | Admitting: Family Medicine

## 2011-02-08 DIAGNOSIS — E785 Hyperlipidemia, unspecified: Secondary | ICD-10-CM

## 2011-02-09 ENCOUNTER — Other Ambulatory Visit (INDEPENDENT_AMBULATORY_CARE_PROVIDER_SITE_OTHER): Payer: BC Managed Care – PPO

## 2011-02-09 DIAGNOSIS — E785 Hyperlipidemia, unspecified: Secondary | ICD-10-CM

## 2011-02-09 LAB — BASIC METABOLIC PANEL
Chloride: 102 mEq/L (ref 96–112)
GFR: 80.92 mL/min (ref >60.00)
Glucose, Bld: 120 mg/dL — ABNORMAL HIGH (ref 70–99)
Potassium: 4 mEq/L (ref 3.5–5.1)
Sodium: 141 mEq/L (ref 135–145)

## 2011-02-09 LAB — HEPATIC FUNCTION PANEL
ALT: 27 U/L (ref 0–35)
AST: 32 U/L (ref 0–37)
Albumin: 4.2 g/dL (ref 3.5–5.2)
Alkaline Phosphatase: 65 U/L (ref 39–117)

## 2011-02-09 LAB — LIPID PANEL
LDL Cholesterol: 41 mg/dL (ref 0–99)
VLDL: 17.6 mg/dL (ref 0.0–40.0)

## 2011-02-12 ENCOUNTER — Other Ambulatory Visit: Payer: Self-pay | Admitting: Family Medicine

## 2011-03-04 ENCOUNTER — Other Ambulatory Visit: Payer: Self-pay | Admitting: Family Medicine

## 2011-03-16 ENCOUNTER — Other Ambulatory Visit: Payer: Self-pay | Admitting: Family Medicine

## 2011-03-16 DIAGNOSIS — E785 Hyperlipidemia, unspecified: Secondary | ICD-10-CM

## 2011-03-16 MED ORDER — ROSUVASTATIN CALCIUM 20 MG PO TABS: 20.0000 mg | ORAL_TABLET | Freq: Every day | ORAL | Status: DC

## 2011-03-16 NOTE — Telephone Encounter (Signed)
Faxed.   KP 

## 2011-04-03 ENCOUNTER — Telehealth: Payer: Self-pay | Admitting: Family Medicine

## 2011-04-03 MED ORDER — LEVOTHYROXINE SODIUM 112 MCG PO TABS: 112.0000 ug | ORAL_TABLET | Freq: Every day | ORAL | Status: DC

## 2011-04-03 NOTE — Telephone Encounter (Signed)
Refill: Levothyroxine 112 mcg tablet. Take 1 tablet by mouth daily. Qty 30. Last fill 03-05-11

## 2011-05-22 ENCOUNTER — Other Ambulatory Visit: Payer: Self-pay | Admitting: Family Medicine

## 2011-06-03 ENCOUNTER — Other Ambulatory Visit: Payer: Self-pay | Admitting: Family Medicine

## 2011-06-04 NOTE — Telephone Encounter (Signed)
Refill done.  

## 2011-06-26 ENCOUNTER — Other Ambulatory Visit: Payer: Self-pay | Admitting: Family Medicine

## 2011-06-26 DIAGNOSIS — F329 Major depressive disorder, single episode, unspecified: Secondary | ICD-10-CM

## 2011-06-26 MED ORDER — VENLAFAXINE HCL ER 150 MG PO CP24: 150.0000 mg | ORAL_CAPSULE | Freq: Every day | ORAL | Status: DC

## 2011-06-26 NOTE — Telephone Encounter (Signed)
refill for effexor xr cap 150mg  Take one capsule daily Qty 90  Last written 6.12.12 (notes: ov due now) Last OV 10.24.12

## 2011-07-24 ENCOUNTER — Other Ambulatory Visit: Payer: Self-pay | Admitting: Family Medicine

## 2011-08-04 ENCOUNTER — Other Ambulatory Visit: Payer: Self-pay | Admitting: Family Medicine

## 2011-08-21 ENCOUNTER — Other Ambulatory Visit: Payer: Self-pay | Admitting: Family Medicine

## 2011-08-24 ENCOUNTER — Other Ambulatory Visit: Payer: Self-pay | Admitting: Family Medicine

## 2011-08-24 NOTE — Telephone Encounter (Signed)
Rx not on med list. Left msg for pt to return call the office.

## 2011-08-27 NOTE — Telephone Encounter (Signed)
Rx last filled in 2009 and removed from med list 04/03/07 due to gout. Please advise      KP

## 2011-08-27 NOTE — Telephone Encounter (Signed)
Wait for pt to call back-- we haven't filled since 2009

## 2011-08-29 ENCOUNTER — Other Ambulatory Visit (INDEPENDENT_AMBULATORY_CARE_PROVIDER_SITE_OTHER): Payer: BC Managed Care – PPO

## 2011-08-29 DIAGNOSIS — R7989 Other specified abnormal findings of blood chemistry: Secondary | ICD-10-CM

## 2011-08-29 DIAGNOSIS — E785 Hyperlipidemia, unspecified: Secondary | ICD-10-CM

## 2011-08-29 LAB — LIPID PANEL
HDL: 44 mg/dL (ref >39.00)
LDL Cholesterol: 42 mg/dL (ref 0–99)
Total CHOL/HDL Ratio: 2
VLDL: 16.6 mg/dL (ref 0.0–40.0)

## 2011-08-29 LAB — HEPATIC FUNCTION PANEL
AST: 27 U/L (ref 0–37)
Total Bilirubin: 0.4 mg/dL (ref 0.3–1.2)

## 2011-08-29 LAB — BASIC METABOLIC PANEL
BUN: 15 mg/dL (ref 6–23)
Calcium: 8.7 mg/dL (ref 8.4–10.5)
Creatinine, Ser: 0.8 mg/dL (ref 0.4–1.2)
GFR: 78.42 mL/min (ref >60.00)

## 2011-08-29 LAB — HEMOGLOBIN A1C: Hgb A1c MFr Bld: 6 % (ref 4.6–6.5)

## 2011-08-29 NOTE — Progress Notes (Signed)
Lab only 

## 2011-09-11 ENCOUNTER — Telehealth: Payer: Self-pay | Admitting: Family Medicine

## 2011-09-11 MED ORDER — LEVOTHYROXINE SODIUM 112 MCG PO TABS: 112.0000 ug | ORAL_TABLET | Freq: Every day | ORAL | Status: DC

## 2011-09-11 NOTE — Telephone Encounter (Signed)
Refill: Levothyroxine tablet. Take 1 tablet by mouth daily. Qty 30. Last fill 08-06-11

## 2011-09-11 NOTE — Telephone Encounter (Signed)
The patient's TSH was not complete with the rest of the labs.  Please advise if you want me to put refills on this script, she is due for labs in 6 mos. Please advise   KP

## 2011-09-11 NOTE — Telephone Encounter (Signed)
It was abnormal the last time it was checked and was supposed to be rechecked in 2 months-----244.9  TSH--- if we have sample of the dose she has we can give that to her or call in 1 month

## 2011-09-13 ENCOUNTER — Telehealth: Payer: Self-pay | Admitting: Family Medicine

## 2011-09-13 DIAGNOSIS — F329 Major depressive disorder, single episode, unspecified: Secondary | ICD-10-CM

## 2011-09-13 MED ORDER — VENLAFAXINE HCL ER 150 MG PO CP24: 150.0000 mg | ORAL_CAPSULE | Freq: Every day | ORAL | Status: DC

## 2011-09-13 NOTE — Telephone Encounter (Signed)
Refill: Effexor xr cap 150mg . Take 1 capsule daily, Office visit due now.

## 2011-09-18 ENCOUNTER — Other Ambulatory Visit: Payer: Self-pay

## 2011-09-18 DIAGNOSIS — F329 Major depressive disorder, single episode, unspecified: Secondary | ICD-10-CM

## 2011-09-18 MED ORDER — VENLAFAXINE HCL ER 150 MG PO CP24: 150.0000 mg | ORAL_CAPSULE | Freq: Every day | ORAL | Status: DC

## 2011-10-13 ENCOUNTER — Other Ambulatory Visit: Payer: Self-pay | Admitting: Family Medicine

## 2011-10-15 MED ORDER — LEVOTHYROXINE SODIUM 112 MCG PO TABS: ORAL_TABLET | ORAL | Status: DC

## 2011-10-15 NOTE — Telephone Encounter (Signed)
Refill: Levothyroxine 112 mcg tablet. Take 1 tablet by mouth daily. Qty 30. Last fill 09-11-11

## 2011-10-16 ENCOUNTER — Telehealth: Payer: Self-pay | Admitting: Internal Medicine

## 2011-10-16 DIAGNOSIS — G4730 Sleep apnea, unspecified: Secondary | ICD-10-CM

## 2011-10-16 NOTE — Telephone Encounter (Signed)
Order has been placed for replacement CPAP.  However, pt has not been seen in the office since 07/2010 and was to follow up in 1 year.  Have left message with pt's husband to have her call to schedule this.

## 2011-10-18 NOTE — Telephone Encounter (Signed)
I spoke with pt husband again and he states the pt is at work. He states he advised her yesterday she needed to call and set an appt. Will await pt call. Carron Curie, CMA

## 2011-11-09 ENCOUNTER — Other Ambulatory Visit: Payer: Self-pay | Admitting: Family Medicine

## 2011-11-09 NOTE — Telephone Encounter (Signed)
Must have lab/ov prior to further refills.

## 2011-12-10 ENCOUNTER — Other Ambulatory Visit: Payer: Self-pay

## 2011-12-10 MED ORDER — LEVOTHYROXINE SODIUM 112 MCG PO TABS: 112.0000 ug | ORAL_TABLET | Freq: Every day | ORAL | Status: DC

## 2011-12-10 NOTE — Telephone Encounter (Signed)
Pt called concerning med refill synthroid. I scheduled her for lab TSH 12/11/11 9:00am so she can get her refill. Rx sent to pharmacy.      MW

## 2011-12-11 ENCOUNTER — Other Ambulatory Visit (INDEPENDENT_AMBULATORY_CARE_PROVIDER_SITE_OTHER): Payer: BC Managed Care – PPO

## 2011-12-11 DIAGNOSIS — E0590 Thyrotoxicosis, unspecified without thyrotoxic crisis or storm: Secondary | ICD-10-CM

## 2011-12-13 ENCOUNTER — Telehealth: Payer: Self-pay

## 2011-12-13 LAB — TSH: TSH: 0.29 u[IU]/mL — ABNORMAL LOW (ref 0.35–5.50)

## 2011-12-13 MED ORDER — LEVOTHYROXINE SODIUM 100 MCG PO TABS: 100.0000 ug | ORAL_TABLET | Freq: Every day | ORAL | Status: DC

## 2011-12-13 NOTE — Telephone Encounter (Signed)
Msg left with Romelle Starcher) advising Rx sent to the [facility] in [place]

## 2011-12-13 NOTE — Telephone Encounter (Signed)
Message copied by [name] Norris on Thu Dec 13, 2011  5:07 PM ------      Message from: Sheliah Hatch      Created: Thu Dec 13, 2011  4:27 PM       TSH remains low.  Would decrease synthroid to daily

## 2012-01-05 ENCOUNTER — Other Ambulatory Visit: Payer: Self-pay | Admitting: Family Medicine

## 2012-03-17 ENCOUNTER — Telehealth: Payer: Self-pay | Admitting: Family Medicine

## 2012-03-17 MED ORDER — LEVOTHYROXINE SODIUM 100 MCG PO TABS: ORAL_TABLET | ORAL | Status: DC

## 2012-03-17 NOTE — Telephone Encounter (Signed)
Refill: Levothyroxine 100 mcg tablet. Take 1 tablet by mouth daily. Qty 30. Last fill 02-09-12

## 2012-04-20 ENCOUNTER — Other Ambulatory Visit: Payer: Self-pay | Admitting: Family Medicine

## 2012-05-14 ENCOUNTER — Other Ambulatory Visit (INDEPENDENT_AMBULATORY_CARE_PROVIDER_SITE_OTHER): Payer: Commercial Managed Care - PPO

## 2012-05-14 DIAGNOSIS — E785 Hyperlipidemia, unspecified: Secondary | ICD-10-CM

## 2012-05-14 DIAGNOSIS — E0590 Thyrotoxicosis, unspecified without thyrotoxic crisis or storm: Secondary | ICD-10-CM

## 2012-05-14 DIAGNOSIS — R7989 Other specified abnormal findings of blood chemistry: Secondary | ICD-10-CM

## 2012-05-14 LAB — HEPATIC FUNCTION PANEL
ALT: 24 U/L (ref 0–35)
AST: 27 U/L (ref 0–37)
Alkaline Phosphatase: 64 U/L (ref 39–117)
Bilirubin, Direct: 0 mg/dL (ref 0.0–0.3)
Total Bilirubin: 0.4 mg/dL (ref 0.3–1.2)

## 2012-05-14 LAB — LIPID PANEL
Cholesterol: 158 mg/dL (ref 0–200)
HDL: 36.4 mg/dL — ABNORMAL LOW (ref >39.00)
VLDL: 22.6 mg/dL (ref 0.0–40.0)

## 2012-05-14 LAB — BASIC METABOLIC PANEL
BUN: 14 mg/dL (ref 6–23)
CO2: 27 mEq/L (ref 19–32)
Calcium: 8.9 mg/dL (ref 8.4–10.5)
Creatinine, Ser: 0.7 mg/dL (ref 0.4–1.2)
GFR: 88.49 mL/min (ref >60.00)
Glucose, Bld: 139 mg/dL — ABNORMAL HIGH (ref 70–99)

## 2012-05-19 ENCOUNTER — Encounter: Payer: Self-pay | Admitting: *Deleted

## 2012-05-24 ENCOUNTER — Other Ambulatory Visit: Payer: Self-pay | Admitting: Family Medicine

## 2012-05-30 ENCOUNTER — Telehealth (INDEPENDENT_AMBULATORY_CARE_PROVIDER_SITE_OTHER): Payer: Self-pay | Admitting: Surgery

## 2012-05-30 NOTE — Telephone Encounter (Signed)
05/30/12 lm and mailed recall letter for pt to schedule a bariatric follow-up appt. Dr. Daphine Deutscher did lap band surgery 11/01/08. (lss)

## 2012-07-07 ENCOUNTER — Telehealth: Payer: Self-pay | Admitting: General Practice

## 2012-07-07 NOTE — Telephone Encounter (Signed)
Refill request for Venlafaxine received. Pt has not been seen in office since 12/13/2010. Pt had labs completed in 04/2012. Pt needs an OV for med refill.

## 2012-07-08 MED ORDER — VENLAFAXINE HCL ER 150 MG PO CP24: ORAL_CAPSULE | ORAL | Status: DC

## 2012-07-08 NOTE — Telephone Encounter (Signed)
Med filled. Please schedule pt.

## 2012-07-10 NOTE — Telephone Encounter (Signed)
Spoke w/pts mother. Pt will call to schdule appt.

## 2012-07-21 ENCOUNTER — Encounter: Payer: Self-pay | Admitting: Family Medicine

## 2012-09-29 ENCOUNTER — Encounter: Payer: Commercial Managed Care - PPO | Admitting: Family Medicine

## 2012-10-31 ENCOUNTER — Other Ambulatory Visit: Payer: Self-pay | Admitting: Family Medicine

## 2012-11-03 NOTE — Telephone Encounter (Signed)
Patient has not been in the last encounter she stated she would call and schedule. No apt scheduled. Rx denied per protocol.     KP

## 2012-11-06 ENCOUNTER — Ambulatory Visit (INDEPENDENT_AMBULATORY_CARE_PROVIDER_SITE_OTHER): Payer: Commercial Managed Care - PPO | Admitting: Family Medicine

## 2012-11-06 ENCOUNTER — Encounter: Payer: Self-pay | Admitting: Family Medicine

## 2012-11-06 VITALS — BP 122/78 | HR 87 | Temp 98.7°F | Resp 12 | Ht 65.0 in | Wt 267.0 lb

## 2012-11-06 DIAGNOSIS — F3289 Other specified depressive episodes: Secondary | ICD-10-CM

## 2012-11-06 DIAGNOSIS — N390 Urinary tract infection, site not specified: Secondary | ICD-10-CM

## 2012-11-06 DIAGNOSIS — E039 Hypothyroidism, unspecified: Secondary | ICD-10-CM

## 2012-11-06 DIAGNOSIS — E669 Obesity, unspecified: Secondary | ICD-10-CM | POA: Insufficient documentation

## 2012-11-06 DIAGNOSIS — F329 Major depressive disorder, single episode, unspecified: Secondary | ICD-10-CM

## 2012-11-06 DIAGNOSIS — E785 Hyperlipidemia, unspecified: Secondary | ICD-10-CM | POA: Insufficient documentation

## 2012-11-06 DIAGNOSIS — Z0000 Encounter for general adult medical examination without abnormal findings: Secondary | ICD-10-CM

## 2012-11-06 HISTORY — DX: Hyperlipidemia, unspecified: E78.5

## 2012-11-06 LAB — HEPATIC FUNCTION PANEL
ALT: 20 U/L (ref 0–35)
AST: 28 U/L (ref 0–37)
Albumin: 4.1 g/dL (ref 3.5–5.2)

## 2012-11-06 LAB — CBC WITH DIFFERENTIAL/PLATELET
Eosinophils Relative: 4 % (ref 0.0–5.0)
HCT: 38.2 % (ref 36.0–46.0)
Hemoglobin: 12.9 g/dL (ref 12.0–15.0)
Lymphs Abs: 1.8 10*3/uL (ref 0.7–4.0)
MCV: 88.6 fl (ref 78.0–100.0)
Monocytes Absolute: 0.4 10*3/uL (ref 0.1–1.0)
Monocytes Relative: 6.9 % (ref 3.0–12.0)
Neutro Abs: 3.3 10*3/uL (ref 1.4–7.7)
RDW: 13.9 % (ref 11.5–14.6)

## 2012-11-06 LAB — BASIC METABOLIC PANEL
Chloride: 107 mEq/L (ref 96–112)
GFR: 68.97 mL/min (ref >60.00)
Glucose, Bld: 110 mg/dL — ABNORMAL HIGH (ref 70–99)
Potassium: 4.2 mEq/L (ref 3.5–5.1)
Sodium: 138 mEq/L (ref 135–145)

## 2012-11-06 LAB — LIPID PANEL: Cholesterol: 261 mg/dL — ABNORMAL HIGH (ref 0–200)

## 2012-11-06 LAB — LDL CHOLESTEROL, DIRECT: Direct LDL: 193.3 mg/dL

## 2012-11-06 MED ORDER — EZETIMIBE 10 MG PO TABS: ORAL_TABLET | ORAL | Status: DC

## 2012-11-06 MED ORDER — ATORVASTATIN CALCIUM 20 MG PO TABS: 20.0000 mg | ORAL_TABLET | Freq: Every day | ORAL | Status: DC

## 2012-11-06 MED ORDER — VENLAFAXINE HCL ER 150 MG PO CP24: 150.0000 mg | ORAL_CAPSULE | Freq: Every day | ORAL | Status: DC

## 2012-11-06 NOTE — Patient Instructions (Addendum)
Preventive Care for Adults, Female A healthy lifestyle and preventive care can promote health and wellness. Preventive health guidelines for women include the following key practices.  A routine yearly physical is a good way to check with your caregiver about your health and preventive screening. It is a chance to share any concerns and updates on your health, and to receive a thorough exam.  Visit your dentist for a routine exam and preventive care every 6 months. Brush your teeth twice a day and floss once a day. Good oral hygiene prevents tooth decay and gum disease.  The frequency of eye exams is based on your age, health, family medical history, use of contact lenses, and other factors. Follow your caregiver's recommendations for frequency of eye exams.  Eat a healthy diet. Foods like vegetables, fruits, whole grains, low-fat dairy products, and lean protein foods contain the nutrients you need without too many calories. Decrease your intake of foods high in solid fats, added sugars, and salt. Eat the right amount of calories for you.Get information about a proper diet from your caregiver, if necessary.  Regular physical exercise is one of the most important things you can do for your health. Most adults should get at least 150 minutes of moderate-intensity exercise (any activity that increases your heart rate and causes you to sweat) each week. In addition, most adults need muscle-strengthening exercises on 2 or more days a week.  Maintain a healthy weight. The body mass index (BMI) is a screening tool to identify possible weight problems. It provides an estimate of body fat based on height and weight. Your caregiver can help determine your BMI, and can help you achieve or maintain a healthy weight.For adults 20 years and older:  A BMI below 18.5 is considered underweight.  A BMI of 18.5 to 24.9 is normal.  A BMI of 25 to 29.9 is considered overweight.  A BMI of 30 and above is  considered obese.  Maintain normal blood lipids and cholesterol levels by exercising and minimizing your intake of saturated fat. Eat a balanced diet with plenty of fruit and vegetables. Blood tests for lipids and cholesterol should begin at age 20 and be repeated every 5 years. If your lipid or cholesterol levels are high, you are over 50, or you are at high risk for heart disease, you may need your cholesterol levels checked more frequently.Ongoing high lipid and cholesterol levels should be treated with medicines if diet and exercise are not effective.  If you smoke, find out from your caregiver how to quit. If you do not use tobacco, do not start.  If you are pregnant, do not drink alcohol. If you are breastfeeding, be very cautious about drinking alcohol. If you are not pregnant and choose to drink alcohol, do not exceed 1 drink per day. One drink is considered to be 12 ounces (355 mL) of beer, 5 ounces (148 mL) of wine, or 1.5 ounces (44 mL) of liquor.  Avoid use of street drugs. Do not share needles with anyone. Ask for help if you need support or instructions about stopping the use of drugs.  High blood pressure causes heart disease and increases the risk of stroke. Your blood pressure should be checked at least every 1 to 2 years. Ongoing high blood pressure should be treated with medicines if weight loss and exercise are not effective.  If you are 55 to 63 years old, ask your caregiver if you should take aspirin to prevent strokes.  Diabetes   screening involves taking a blood sample to check your fasting blood sugar level. This should be done once every 3 years, after age 45, if you are within normal weight and without risk factors for diabetes. Testing should be considered at a younger age or be carried out more frequently if you are overweight and have at least 1 risk factor for diabetes.  Breast cancer screening is essential preventive care for women. You should practice "breast  self-awareness." This means understanding the normal appearance and feel of your breasts and may include breast self-examination. Any changes detected, no matter how small, should be reported to a caregiver. Women in their 20s and 30s should have a clinical breast exam (CBE) by a caregiver as part of a regular health exam every 1 to 3 years. After age 40, women should have a CBE every year. Starting at age 40, women should consider having a mammography (breast X-ray test) every year. Women who have a family history of breast cancer should talk to their caregiver about genetic screening. Women at a high risk of breast cancer should talk to their caregivers about having magnetic resonance imaging (MRI) and a mammography every year.  The Pap test is a screening test for cervical cancer. A Pap test can show cell changes on the cervix that might become cervical cancer if left untreated. A Pap test is a procedure in which cells are obtained and examined from the lower end of the uterus (cervix).  Women should have a Pap test starting at age 21.  Between ages 21 and 29, Pap tests should be repeated every 2 years.  Beginning at age 30, you should have a Pap test every 3 years as long as the past 3 Pap tests have been normal.  Some women have medical problems that increase the chance of getting cervical cancer. Talk to your caregiver about these problems. It is especially important to talk to your caregiver if a new problem develops soon after your last Pap test. In these cases, your caregiver may recommend more frequent screening and Pap tests.  The above recommendations are the same for women who have or have not gotten the vaccine for human papillomavirus (HPV).  If you had a hysterectomy for a problem that was not cancer or a condition that could lead to cancer, then you no longer need Pap tests. Even if you no longer need a Pap test, a regular exam is a good idea to make sure no other problems are  starting.  If you are between ages 65 and 70, and you have had normal Pap tests going back 10 years, you no longer need Pap tests. Even if you no longer need a Pap test, a regular exam is a good idea to make sure no other problems are starting.  If you have had past treatment for cervical cancer or a condition that could lead to cancer, you need Pap tests and screening for cancer for at least 20 years after your treatment.  If Pap tests have been discontinued, risk factors (such as a new sexual partner) need to be reassessed to determine if screening should be resumed.  The HPV test is an additional test that may be used for cervical cancer screening. The HPV test looks for the virus that can cause the cell changes on the cervix. The cells collected during the Pap test can be tested for HPV. The HPV test could be used to screen women aged 30 years and older, and should   be used in women of any age who have unclear Pap test results. After the age of 30, women should have HPV testing at the same frequency as a Pap test.  Colorectal cancer can be detected and often prevented. Most routine colorectal cancer screening begins at the age of 50 and continues through age 75. However, your caregiver may recommend screening at an earlier age if you have risk factors for colon cancer. On a yearly basis, your caregiver may provide home test kits to check for hidden blood in the stool. Use of a small camera at the end of a tube, to directly examine the colon (sigmoidoscopy or colonoscopy), can detect the earliest forms of colorectal cancer. Talk to your caregiver about this at age 50, when routine screening begins. Direct examination of the colon should be repeated every 5 to 10 years through age 75, unless early forms of pre-cancerous polyps or small growths are found.  Hepatitis C blood testing is recommended for all people born from 1945 through 1965 and any individual with known risks for hepatitis C.  Practice  safe sex. Use condoms and avoid high-risk sexual practices to reduce the spread of sexually transmitted infections (STIs). STIs include gonorrhea, chlamydia, syphilis, trichomonas, herpes, HPV, and human immunodeficiency virus (HIV). Herpes, HIV, and HPV are viral illnesses that have no cure. They can result in disability, cancer, and death. Sexually active women aged 25 and younger should be checked for chlamydia. Older women with new or multiple partners should also be tested for chlamydia. Testing for other STIs is recommended if you are sexually active and at increased risk.  Osteoporosis is a disease in which the bones lose minerals and strength with aging. This can result in serious bone fractures. The risk of osteoporosis can be identified using a bone density scan. Women ages 65 and over and women at risk for fractures or osteoporosis should discuss screening with their caregivers. Ask your caregiver whether you should take a calcium supplement or vitamin D to reduce the rate of osteoporosis.  Menopause can be associated with physical symptoms and risks. Hormone replacement therapy is available to decrease symptoms and risks. You should talk to your caregiver about whether hormone replacement therapy is right for you.  Use sunscreen with sun protection factor (SPF) of 30 or more. Apply sunscreen liberally and repeatedly throughout the day. You should seek shade when your shadow is shorter than you. Protect yourself by wearing long sleeves, pants, a wide-brimmed hat, and sunglasses year round, whenever you are outdoors.  Once a month, do a whole body skin exam, using a mirror to look at the skin on your back. Notify your caregiver of new moles, moles that have irregular borders, moles that are larger than a pencil eraser, or moles that have changed in shape or color.  Stay current with required immunizations.  Influenza. You need a dose every fall (or winter). The composition of the flu vaccine  changes each year, so being vaccinated once is not enough.  Pneumococcal polysaccharide. You need 1 to 2 doses if you smoke cigarettes or if you have certain chronic medical conditions. You need 1 dose at age 65 (or older) if you have never been vaccinated.  Tetanus, diphtheria, pertussis (Tdap, Td). Get 1 dose of Tdap vaccine if you are younger than age 65, are over 65 and have contact with an infant, are a healthcare worker, are pregnant, or simply want to be protected from whooping cough. After that, you need a Td   booster dose every 10 years. Consult your caregiver if you have not had at least 3 tetanus and diphtheria-containing shots sometime in your life or have a deep or dirty wound.  HPV. You need this vaccine if you are a woman age 26 or younger. The vaccine is given in 3 doses over 6 months.  Measles, mumps, rubella (MMR). You need at least 1 dose of MMR if you were born in 1957 or later. You may also need a second dose.  Meningococcal. If you are age 19 to 21 and a first-year college student living in a residence hall, or have one of several medical conditions, you need to get vaccinated against meningococcal disease. You may also need additional booster doses.  Zoster (shingles). If you are age 60 or older, you should get this vaccine.  Varicella (chickenpox). If you have never had chickenpox or you were vaccinated but received only 1 dose, talk to your caregiver to find out if you need this vaccine.  Hepatitis A. You need this vaccine if you have a specific risk factor for hepatitis A virus infection or you simply wish to be protected from this disease. The vaccine is usually given as 2 doses, 6 to 18 months apart.  Hepatitis B. You need this vaccine if you have a specific risk factor for hepatitis B virus infection or you simply wish to be protected from this disease. The vaccine is given in 3 doses, usually over 6 months. Preventive Services / Frequency Ages 19 to 39  Blood  pressure check.** / Every 1 to 2 years.  Lipid and cholesterol check.** / Every 5 years beginning at age 20.  Clinical breast exam.** / Every 3 years for women in their 20s and 30s.  Pap test.** / Every 2 years from ages 21 through 29. Every 3 years starting at age 30 through age 65 or 70 with a history of 3 consecutive normal Pap tests.  HPV screening.** / Every 3 years from ages 30 through ages 65 to 70 with a history of 3 consecutive normal Pap tests.  Hepatitis C blood test.** / For any individual with known risks for hepatitis C.  Skin self-exam. / Monthly.  Influenza immunization.** / Every year.  Pneumococcal polysaccharide immunization.** / 1 to 2 doses if you smoke cigarettes or if you have certain chronic medical conditions.  Tetanus, diphtheria, pertussis (Tdap, Td) immunization. / A one-time dose of Tdap vaccine. After that, you need a Td booster dose every 10 years.  HPV immunization. / 3 doses over 6 months, if you are 26 and younger.  Measles, mumps, rubella (MMR) immunization. / You need at least 1 dose of MMR if you were born in 1957 or later. You may also need a second dose.  Meningococcal immunization. / 1 dose if you are age 19 to 21 and a first-year college student living in a residence hall, or have one of several medical conditions, you need to get vaccinated against meningococcal disease. You may also need additional booster doses.  Varicella immunization.** / Consult your caregiver.  Hepatitis A immunization.** / Consult your caregiver. 2 doses, 6 to 18 months apart.  Hepatitis B immunization.** / Consult your caregiver. 3 doses usually over 6 months. Ages 40 to 64  Blood pressure check.** / Every 1 to 2 years.  Lipid and cholesterol check.** / Every 5 years beginning at age 20.  Clinical breast exam.** / Every year after age 40.  Mammogram.** / Every year beginning at age 40   and continuing for as long as you are in good health. Consult with your  caregiver.  Pap test.** / Every 3 years starting at age 30 through age 65 or 70 with a history of 3 consecutive normal Pap tests.  HPV screening.** / Every 3 years from ages 30 through ages 65 to 70 with a history of 3 consecutive normal Pap tests.  Fecal occult blood test (FOBT) of stool. / Every year beginning at age 50 and continuing until age 75. You may not need to do this test if you get a colonoscopy every 10 years.  Flexible sigmoidoscopy or colonoscopy.** / Every 5 years for a flexible sigmoidoscopy or every 10 years for a colonoscopy beginning at age 50 and continuing until age 75.  Hepatitis C blood test.** / For all people born from 1945 through 1965 and any individual with known risks for hepatitis C.  Skin self-exam. / Monthly.  Influenza immunization.** / Every year.  Pneumococcal polysaccharide immunization.** / 1 to 2 doses if you smoke cigarettes or if you have certain chronic medical conditions.  Tetanus, diphtheria, pertussis (Tdap, Td) immunization.** / A one-time dose of Tdap vaccine. After that, you need a Td booster dose every 10 years.  Measles, mumps, rubella (MMR) immunization. / You need at least 1 dose of MMR if you were born in 1957 or later. You may also need a second dose.  Varicella immunization.** / Consult your caregiver.  Meningococcal immunization.** / Consult your caregiver.  Hepatitis A immunization.** / Consult your caregiver. 2 doses, 6 to 18 months apart.  Hepatitis B immunization.** / Consult your caregiver. 3 doses, usually over 6 months. Ages 65 and over  Blood pressure check.** / Every 1 to 2 years.  Lipid and cholesterol check.** / Every 5 years beginning at age 20.  Clinical breast exam.** / Every year after age 40.  Mammogram.** / Every year beginning at age 40 and continuing for as long as you are in good health. Consult with your caregiver.  Pap test.** / Every 3 years starting at age 30 through age 65 or 70 with a 3  consecutive normal Pap tests. Testing can be stopped between 65 and 70 with 3 consecutive normal Pap tests and no abnormal Pap or HPV tests in the past 10 years.  HPV screening.** / Every 3 years from ages 30 through ages 65 or 70 with a history of 3 consecutive normal Pap tests. Testing can be stopped between 65 and 70 with 3 consecutive normal Pap tests and no abnormal Pap or HPV tests in the past 10 years.  Fecal occult blood test (FOBT) of stool. / Every year beginning at age 50 and continuing until age 75. You may not need to do this test if you get a colonoscopy every 10 years.  Flexible sigmoidoscopy or colonoscopy.** / Every 5 years for a flexible sigmoidoscopy or every 10 years for a colonoscopy beginning at age 50 and continuing until age 75.  Hepatitis C blood test.** / For all people born from 1945 through 1965 and any individual with known risks for hepatitis C.  Osteoporosis screening.** / A one-time screening for women ages 65 and over and women at risk for fractures or osteoporosis.  Skin self-exam. / Monthly.  Influenza immunization.** / Every year.  Pneumococcal polysaccharide immunization.** / 1 dose at age 65 (or older) if you have never been vaccinated.  Tetanus, diphtheria, pertussis (Tdap, Td) immunization. / A one-time dose of Tdap vaccine if you are over   65 and have contact with an infant, are a healthcare worker, or simply want to be protected from whooping cough. After that, you need a Td booster dose every 10 years.  Varicella immunization.** / Consult your caregiver.  Meningococcal immunization.** / Consult your caregiver.  Hepatitis A immunization.** / Consult your caregiver. 2 doses, 6 to 18 months apart.  Hepatitis B immunization.** / Check with your caregiver. 3 doses, usually over 6 months. ** Family history and personal history of risk and conditions may change your caregiver's recommendations. Document Released: 04/03/2001 Document Revised: 04/30/2011  Document Reviewed: 07/03/2010 [employer] Patient Information 2014 [employer].  

## 2012-11-06 NOTE — Assessment & Plan Note (Signed)
Check labs Cont' meds 

## 2012-11-06 NOTE — Progress Notes (Signed)
Subjective:     Amy Mahoney is a 62 y.o. female and is here for a comprehensive physical exam. The patient reports no problems.  History   Social History  . Marital Status: Single    Spouse Name: N/A    Number of Children: N/A  . 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     Social History Main Topics  . Smoking status: Former Smoker -- 0.50 packs/day for 3 years    Types: Cigarettes    Quit date: 08/10/1990  . Smokeless tobacco: Never Used  . Alcohol Use: Yes  . Drug Use: No  . Sexual Activity: Not Currently    Partners: Male   Other Topics Concern  . Not on file   Social History Narrative   Regular exercise - yes   Caffeine use: 2 cups of coffee daily   Health Maintenance  Topic Date Due  . Colonoscopy  10/21/1999  . Influenza Vaccine  09/19/2012  . Mammogram  06/13/2014  . Pap Smear  05/01/2015  . Tetanus/tdap  07/31/2020  . Zostavax  Completed    The following portions of the patient's history were reviewed and updated as appropriate:  She  has a past medical history of Thyroid disease and Depression. She  does not have any pertinent problems on file. She  has past surgical history that includes Cholecystectomy; Abdominal hysterectomy; and Tonsillectomy. Her family history includes Arthritis in an other family member; Breast cancer in her maternal aunt and paternal grandmother; Cancer in her maternal aunt and maternal aunt; Depression in an other family member; Diabetes in her maternal aunt; Hyperlipidemia in an other family member; Lung cancer in her maternal aunt. She  reports that she quit smoking about 22 years ago. Her smoking use included Cigarettes. She has a 1.5 pack-year smoking history. She has never used smokeless tobacco. She reports that  drinks alcohol. She reports that she does not use illicit drugs. She has a current medication list which includes the following prescription(s): calcium carbonate-vitamin d,  ezetimibe, levothyroxine, multivitamin, venlafaxine xr, and atorvastatin, and the following Facility-Administered Medications: tdap. Current Outpatient Prescriptions on File Prior to Visit  Medication Sig Dispense Refill  . Calcium Carbonate-Vitamin D (CALCIUM 500 + D PO) Take 1 tablet by mouth 2 (two) times daily.        [name] levothyroxine (SYNTHROID, LEVOTHROID) 100 MCG tablet Take 1 tablet (100 mcg total) by mouth daily before breakfast.  30 tablet  11  . Multiple Vitamin (MULTIVITAMIN) tablet Take 1 tablet by mouth daily.         Current Facility-Administered Medications on File Prior to Visit  Medication Dose Route Frequency Provider Last Rate Last Dose  . TDaP (BOOSTRIX) injection 0.5 mL  0.5 mL Intramuscular Once Lelon Perla, DO       She has No Known Allergies..  Review of Systems Review of Systems  Constitutional: Negative for activity change, appetite change and fatigue.  HENT: Negative for hearing loss, congestion, tinnitus and ear discharge.  dentist q37m Eyes: Negative for visual disturbance (see optho q1y -- vision corrected to 20/20 with glasses).  Respiratory: Negative for cough, chest tightness and shortness of breath.   Cardiovascular: Negative for chest pain, palpitations and leg swelling.  Gastrointestinal: Negative for abdominal pain, diarrhea, constipation and abdominal distention.  Genitourinary: Negative for urgency, frequency, decreased urine volume and difficulty urinating.  Musculoskeletal: Negative for back pain, arthralgias and gait problem.  Skin: Negative  for color change, pallor and rash.  Neurological: Negative for dizziness, light-headedness, numbness and headaches.  Hematological: Negative for adenopathy. Does not bruise/bleed easily.  Psychiatric/Behavioral: Negative for suicidal ideas, confusion, sleep disturbance, self-injury, dysphoric mood, decreased concentration and agitation.       Objective:    BP 122/78  Pulse 87  Temp(Src) 98.7 F (37.1  C) (Oral)  Resp 12  Ht 5\' 5"  (1.651 m)  Wt 267 lb (121.11 kg)  BMI 44.43 kg/m2  SpO2 96% General appearance: alert, cooperative, appears stated age and no distress Head: Normocephalic, without obvious abnormality, atraumatic Eyes: conjunctivae/corneas clear. PERRL, EOM's intact. Fundi benign. Ears: normal TM's and external ear canals both ears Nose: Nares normal. Septum midline. Mucosa normal. No drainage or sinus tenderness. Throat: lips, mucosa, and tongue normal; teeth and gums normal Neck: no adenopathy, no carotid bruit, no JVD, supple, symmetrical, trachea midline and thyroid not enlarged, symmetric, no tenderness/mass/nodules Back: symmetric, no curvature. ROM normal. No CVA tenderness. Lungs: clear to auscultation bilaterally Breasts: gyn Heart: regular rate and rhythm, S1, S2 normal, no murmur, click, rub or gallop Abdomen: soft, non-tender; bowel sounds normal; no masses,  no organomegaly Pelvic: deferred--gyn Extremities: extremities normal, atraumatic, no cyanosis or edema Pulses: 2+ and symmetric Skin: Skin color, texture, turgor normal. No rashes or lesions Lymph nodes: Cervical, supraclavicular, and axillary nodes normal. Neurologic: Alert and oriented X 3, normal strength and tone. Normal symmetric reflexes. Normal coordination and gait Psych--no depression, no anxiety      Assessment:    Healthy female exam.      Plan:    check fasting labs Ghm utd See After Visit Summary for Counseling Recommendations

## 2012-11-06 NOTE — Assessment & Plan Note (Signed)
Check labs con't meds 

## 2012-11-07 LAB — POCT URINALYSIS DIPSTICK
Bilirubin, UA: NEGATIVE
Blood, UA: NEGATIVE
Glucose, UA: NEGATIVE
Ketones, UA: NEGATIVE
Spec Grav, UA: 1.01

## 2012-11-07 NOTE — Addendum Note (Signed)
Addended by: Silvio Pate D on: 11/07/2012 02:04 PM   Modules accepted: Orders

## 2012-11-10 LAB — URINE CULTURE: Colony Count: >=100000

## 2012-11-13 ENCOUNTER — Other Ambulatory Visit: Payer: Self-pay

## 2012-11-13 MED ORDER — CIPROFLOXACIN HCL 500 MG PO TABS: 500.0000 mg | ORAL_TABLET | Freq: Two times a day (BID) | ORAL | Status: DC

## 2012-11-13 MED ORDER — ANTARA 90 MG PO CAPS: 1.0000 | ORAL_CAPSULE | Freq: Every day | ORAL | Status: DC

## 2012-12-01 ENCOUNTER — Encounter: Payer: Self-pay | Admitting: Internal Medicine

## 2012-12-22 ENCOUNTER — Ambulatory Visit (AMBULATORY_SURGERY_CENTER): Payer: Self-pay

## 2012-12-22 VITALS — Ht 66.0 in | Wt 266.0 lb

## 2012-12-22 DIAGNOSIS — Z1211 Encounter for screening for malignant neoplasm of colon: Secondary | ICD-10-CM

## 2012-12-22 MED ORDER — MOVIPREP 100 G PO SOLR: 1.0000 | Freq: Once | ORAL | Status: DC

## 2012-12-25 ENCOUNTER — Other Ambulatory Visit: Payer: Self-pay

## 2012-12-25 ENCOUNTER — Encounter: Payer: Self-pay | Admitting: Family Medicine

## 2012-12-26 ENCOUNTER — Encounter: Payer: Self-pay | Admitting: Internal Medicine

## 2013-01-01 ENCOUNTER — Encounter: Payer: Self-pay | Admitting: Internal Medicine

## 2013-01-01 ENCOUNTER — Ambulatory Visit (AMBULATORY_SURGERY_CENTER): Payer: Commercial Managed Care - PPO | Admitting: Internal Medicine

## 2013-01-01 VITALS — BP 123/70 | HR 62 | Temp 97.0°F | Resp 13 | Ht 66.0 in | Wt 266.0 lb

## 2013-01-01 DIAGNOSIS — D126 Benign neoplasm of colon, unspecified: Secondary | ICD-10-CM

## 2013-01-01 DIAGNOSIS — Z1211 Encounter for screening for malignant neoplasm of colon: Secondary | ICD-10-CM

## 2013-01-01 MED ORDER — SODIUM CHLORIDE 0.9 % IV SOLN: 500.0000 mL | INTRAVENOUS | Status: DC

## 2013-01-01 NOTE — Progress Notes (Signed)
Lidocaine-40mg IV prior to Propofol InductionPropofol given over incremental dosages 

## 2013-01-01 NOTE — Progress Notes (Signed)
Called to room to assist during endoscopic procedure.  Patient ID and intended procedure confirmed with present staff. Received instructions for my participation in the procedure from the performing physician.  

## 2013-01-01 NOTE — Op Note (Signed)
[facility] [address] [place], [address]   COLONOSCOPY PROCEDURE REPORT  PATIENT: Amy, Mahoney  MR#: [id number] BIRTHDATE: 16-Sep-1950 , 62  yrs. old GENDER: Female ENDOSCOPIST: Beverley Fiedler, MD REFERRED VO:[id number] Lowne, DO PROCEDURE DATE:  01/01/2013 PROCEDURE:   Colonoscopy with snare polypectomy First Screening Colonoscopy - Avg.  risk and is 50 yrs.  old or older - No.  Prior Negative Screening - Now for repeat screening. 10 or more years since last screening  History of Adenoma - Now for follow-up colonoscopy & has been > or = to 3 yrs.  N/A  Polyps Removed Today? Yes. ASA CLASS:   Class II INDICATIONS:average risk screening and Last colonoscopy performed 10 years ago. MEDICATIONS: MAC sedation, administered by CRNA and propofol (Diprivan) 350mg  IV  DESCRIPTION OF PROCEDURE:   After the risks benefits and alternatives of the procedure were thoroughly explained, informed consent was obtained.  A digital rectal exam revealed no rectal mass.   The LB [id number]  endoscope was introduced through the anus and advanced to the cecum, which was identified by both the appendix and ileocecal valve. No adverse events experienced. The quality of the prep was good, using MoviPrep  The instrument was then slowly withdrawn as the colon was fully examined.   COLON FINDINGS: Two sessile polyps measuring 5 mm in size were found in the ascending colon.  Polypectomy was performed using cold snare.  All resections were complete and all polyp tissue was completely retrieved.   The colon mucosa was otherwise normal. Retroflexed views revealed external hemorrhoids. The time to cecum=5 minutes 54 seconds.  Withdrawal time=9 minutes 49 seconds. The scope was withdrawn and the procedure completed. COMPLICATIONS: There were no complications.  ENDOSCOPIC IMPRESSION: 1.   Two sessile polyps measuring 5 mm in size were found in the ascending colon; Polypectomy was  performed using cold snare 2.   The colon mucosa was otherwise normal  RECOMMENDATIONS: 1.  Await pathology results 2.  If the polyps removed today are proven to be adenomatous (pre-cancerous) polyps, you will need a repeat colonoscopy in 5 years.  Otherwise you should continue to follow colorectal cancer screening guidelines for "routine risk" patients with colonoscopy in 10 years.  You will receive a letter within 1-2 weeks with the results of your biopsy as well as final recommendations.  Please call my office if you have not received a letter after 3 weeks.   eSigned:  Beverley Fiedler, MD 01/01/2013 2:06 PM cc: The Patient and Lelon Perla, DO

## 2013-01-01 NOTE — Patient Instructions (Signed)
Discharge instructions given with verbal understanding. Handout on polyps. Resume previous medications. YOU HAD AN ENDOSCOPIC PROCEDURE TODAY AT THE [facility]: Refer to the procedure report that was given to you for any specific questions about what was found during the examination.  If the procedure report does not answer your questions, please call your gastroenterologist to clarify.  If you requested that your care partner not be given the details of your procedure findings, then the procedure report has been included in a sealed envelope for you to review at your convenience later.  YOU SHOULD EXPECT: Some feelings of bloating in the abdomen. Passage of more gas than usual.  Walking can help get rid of the air that was put into your GI tract during the procedure and reduce the bloating. If you had a lower endoscopy (such as a colonoscopy or flexible sigmoidoscopy) you may notice spotting of blood in your stool or on the toilet paper. If you underwent a bowel prep for your procedure, then you may not have a normal bowel movement for a few days.  DIET: Your first meal following the procedure should be a light meal and then it is ok to progress to your normal diet.  A half-sandwich or bowl of soup is an example of a good first meal.  Heavy or fried foods are harder to digest and may make you feel nauseous or bloated.  Likewise meals heavy in dairy and vegetables can cause extra gas to form and this can also increase the bloating.  Drink plenty of fluids but you should avoid alcoholic beverages for 24 hours.  ACTIVITY: Your care partner should take you home directly after the procedure.  You should plan to take it easy, moving slowly for the rest of the day.  You can resume normal activity the day after the procedure however you should NOT DRIVE or use heavy machinery for 24 hours (because of the sedation medicines used during the test).    SYMPTOMS TO REPORT IMMEDIATELY: A  gastroenterologist can be reached at any hour.  During normal business hours, 8:30 AM to 5:00 PM Monday through Friday, call [phone].  After hours and on weekends, please call the GI answering service at [phone] who will take a message and have the physician on call contact you.   Following lower endoscopy (colonoscopy or flexible sigmoidoscopy):  Excessive amounts of blood in the stool  Significant tenderness or worsening of abdominal pains  Swelling of the abdomen that is new, acute  Fever of 100F or higher  FOLLOW UP: If any biopsies were taken you will be contacted by phone or by letter within the next 1-3 weeks.  Call your gastroenterologist if you have not heard about the biopsies in 3 weeks.  Our staff will call the home number listed on your records the next business day following your procedure to check on you and address any questions or concerns that you may have at that time regarding the information given to you following your procedure. This is a courtesy call and so if there is no answer at the home number and we have not heard from you through the emergency physician on call, we will assume that you have returned to your regular daily activities without incident.  SIGNATURES/CONFIDENTIALITY: You and/or your care partner have signed paperwork which will be entered into your electronic medical record.  These signatures attest to the fact that that the information above on your After Visit Summary has been   reviewed and is understood.  Full responsibility of the confidentiality of this discharge information lies with you and/or your care-partner. 

## 2013-01-01 NOTE — Progress Notes (Signed)
Patient did not experience any of the following events: a burn prior to discharge; a fall within the facility; wrong site/side/patient/procedure/implant event; or a hospital transfer or hospital admission upon discharge from the facility. (G8907) Patient did not have preoperative order for IV antibiotic SSI prophylaxis. (G8918)  

## 2013-01-02 ENCOUNTER — Encounter: Payer: Self-pay | Admitting: *Deleted

## 2013-01-02 NOTE — Telephone Encounter (Deleted)
  Follow up Call-  Call back number 01/01/2013  Post procedure Call Back phone  # [phone]  Permission to leave phone message Yes     Patient questions:  Do you have a fever, pain , or abdominal swelling? {yes no:314532} Pain Score  {NUMBERS; 0-10:5044} *  Have you tolerated food without any problems? {yes no:314532}  Have you been able to return to your normal activities? {yes no:314532}  Do you have any questions about your discharge instructions: Diet   {yes no:314532} Medications  {yes no:314532} Follow up visit  {yes no:314532}  Do you have questions or concerns about your Care? {yes no:314532}  Actions: * If pain score is 4 or above: {ACTION; [facility] ENDO PAIN >4:21563::"No action needed, pain <4."}

## 2013-01-02 NOTE — Telephone Encounter (Signed)
error 

## 2013-01-06 ENCOUNTER — Encounter: Payer: Self-pay | Admitting: Internal Medicine

## 2013-01-30 ENCOUNTER — Encounter: Payer: Self-pay | Admitting: Family Medicine

## 2013-02-06 ENCOUNTER — Other Ambulatory Visit (INDEPENDENT_AMBULATORY_CARE_PROVIDER_SITE_OTHER): Payer: Commercial Managed Care - PPO

## 2013-02-06 DIAGNOSIS — E785 Hyperlipidemia, unspecified: Secondary | ICD-10-CM

## 2013-02-06 LAB — HEPATIC FUNCTION PANEL
ALT: 69 U/L — ABNORMAL HIGH (ref 0–35)
Alkaline Phosphatase: 53 U/L (ref 39–117)
Bilirubin, Direct: 0 mg/dL (ref 0.0–0.3)
Total Protein: 6.7 g/dL (ref 6.0–8.3)

## 2013-02-06 LAB — LIPID PANEL
Cholesterol: 145 mg/dL (ref 0–200)
HDL: 38.5 mg/dL — ABNORMAL LOW (ref >39.00)
LDL Cholesterol: 90 mg/dL (ref 0–99)
Total CHOL/HDL Ratio: 4
VLDL: 17 mg/dL (ref 0.0–40.0)

## 2013-03-28 ENCOUNTER — Encounter (HOSPITAL_BASED_OUTPATIENT_CLINIC_OR_DEPARTMENT_OTHER): Payer: Self-pay | Admitting: Emergency Medicine

## 2013-03-28 ENCOUNTER — Emergency Department (HOSPITAL_BASED_OUTPATIENT_CLINIC_OR_DEPARTMENT_OTHER)
Admission: EM | Admit: 2013-03-28 | Discharge: 2013-03-28 | Disposition: A | Discharge status: Home / Self Care | Payer: Commercial Managed Care - PPO | Attending: Emergency Medicine | Admitting: Emergency Medicine

## 2013-03-28 ENCOUNTER — Emergency Department (HOSPITAL_BASED_OUTPATIENT_CLINIC_OR_DEPARTMENT_OTHER): Payer: Commercial Managed Care - PPO

## 2013-03-28 DIAGNOSIS — F329 Major depressive disorder, single episode, unspecified: Secondary | ICD-10-CM | POA: Insufficient documentation

## 2013-03-28 DIAGNOSIS — F3289 Other specified depressive episodes: Secondary | ICD-10-CM | POA: Insufficient documentation

## 2013-03-28 DIAGNOSIS — J069 Acute upper respiratory infection, unspecified: Secondary | ICD-10-CM | POA: Insufficient documentation

## 2013-03-28 DIAGNOSIS — E039 Hypothyroidism, unspecified: Secondary | ICD-10-CM | POA: Insufficient documentation

## 2013-03-28 DIAGNOSIS — Z79899 Other long term (current) drug therapy: Secondary | ICD-10-CM | POA: Insufficient documentation

## 2013-03-28 DIAGNOSIS — Z792 Long term (current) use of antibiotics: Secondary | ICD-10-CM | POA: Insufficient documentation

## 2013-03-28 DIAGNOSIS — Z87891 Personal history of nicotine dependence: Secondary | ICD-10-CM | POA: Insufficient documentation

## 2013-03-28 DIAGNOSIS — R5381 Other malaise: Secondary | ICD-10-CM | POA: Insufficient documentation

## 2013-03-28 DIAGNOSIS — R5383 Other fatigue: Secondary | ICD-10-CM

## 2013-03-28 MED ORDER — GUAIFENESIN-CODEINE 100-10 MG/5ML PO SOLN: 10.0000 mL | Freq: Four times a day (QID) | ORAL | Status: DC | PRN

## 2013-03-28 NOTE — ED Provider Notes (Signed)
CSN: [id number]     Arrival date & time 03/28/13  0740 History   First MD Initiated Contact with Patient 03/28/13 [phone]     Chief Complaint  Patient presents with  . URI   (Consider location/radiation/quality/duration/timing/severity/associated sxs/prior Treatment) Patient is a 63 y.o. female presenting with URI. The history is provided by the patient.  URI Presenting symptoms: congestion, cough and fatigue   Severity:  Moderate Onset quality:  Gradual Duration:  3 weeks Timing:  Constant Progression:  Unchanged Chronicity:  New Relieved by:  Nothing Worsened by:  Nothing tried Ineffective treatments: Augmentin and an inhaler. Associated symptoms: no arthralgias and no neck pain     Past Medical History  Diagnosis Date  . Thyroid disease     hypothyroidism  . Depression    Past Surgical History  Procedure Laterality Date  . Cholecystectomy    . Dilation and curettage of uterus    . Tonsillectomy    . Fibroids scrapping     Family History  Problem Relation Age of Onset  . Depression    . Hyperlipidemia    . Arthritis    . Diabetes Maternal Aunt   . Breast cancer Paternal Grandmother   . Lung cancer Maternal Aunt     Nonsmoker  . Cancer Maternal Aunt     breast  . Breast cancer Maternal Aunt   . Cancer Maternal Aunt     lung(nonsmoker)  . Colon cancer Neg Hx   . Pancreatic cancer Neg Hx   . Stomach cancer Neg Hx    History  Substance Use Topics  . Smoking status: Former Smoker -- 0.50 packs/day for 3 years    Types: Cigarettes    Quit date: 08/10/1990  . Smokeless tobacco: Never Used  . Alcohol Use: Yes   OB History   Grav Para Term Preterm Abortions TAB SAB Ect Mult Living                 Review of Systems  Constitutional: Positive for fatigue.  HENT: Positive for congestion.   Respiratory: Positive for cough.   Musculoskeletal: Negative for arthralgias and neck pain.  All other systems reviewed and are negative.    Allergies  Review of  patient's allergies indicates no known allergies.  Home Medications   Current Outpatient Rx  Name  Route  Sig  Dispense  Refill  . amoxicillin-clavulanate (AUGMENTIN) 875-125 MG per tablet   Oral   Take 1 tablet by mouth 2 (two) times daily.         Wendi Maya 90 MG CAPS   Oral   Take 1 capsule by mouth daily.   30 capsule   5     Dispense as written.    Discount code: [id number] Group: [id number] ID:[id number] ...   . atorvastatin (LIPITOR) 20 MG tablet   Oral   Take 1 tablet (20 mg total) by mouth daily.   90 tablet   3   . Calcium Carbonate-Vitamin D (CALCIUM 500 + D PO)   Oral   Take 1 tablet by mouth 2 (two) times daily.           [name] levothyroxine (SYNTHROID, LEVOTHROID) 100 MCG tablet   Oral   Take 1 tablet (100 mcg total) by mouth daily before breakfast.   30 tablet   11   . Multiple Vitamin (MULTIVITAMIN) tablet   Oral   Take 1 tablet by mouth daily.           [name]  venlafaxine XR (EFFEXOR-XR) 150 MG 24 hr capsule   Oral   Take 1 capsule (150 mg total) by mouth daily.   90 capsule   3    BP 103/51  Pulse 70  Temp(Src) 97.5 F (36.4 C) (Oral)  Resp 18  Ht 5\' 6"  (1.676 m)  SpO2 93% Physical Exam  Nursing note and vitals reviewed. Constitutional: She is oriented to person, place, and time. She appears well-developed and well-nourished. No distress.  HENT:  Head: Normocephalic and atraumatic.  Mouth/Throat: Oropharynx is clear and moist.  TMs are clear bilaterally.  Neck: Normal range of motion. Neck supple.  Cardiovascular: Normal rate and regular rhythm.  Exam reveals no gallop and no friction rub.   No murmur heard. Pulmonary/Chest: Effort normal and breath sounds normal. No respiratory distress. She has no wheezes. She has no rales. She exhibits no tenderness.  Abdominal: Soft. Bowel sounds are normal. She exhibits no distension. There is no tenderness.  Musculoskeletal: Normal range of motion. She exhibits no edema.  Neurological: She is alert and  oriented to person, place, and time.  Skin: Skin is warm and dry. She is not diaphoretic.    ED Course  Procedures (including critical care time) Labs Review Labs Reviewed - No data to display Imaging Review No results found.    MDM  No diagnosis found. Patient is a 63 year old female with history of hypothyroidism. She presents with a three-week history of chest congestion and cough. She was given an antibiotic earlier this week however is not improving. She denies any fevers or chills. She denies any shortness of breath. Chest x-ray is clear and lungs are without wheezes or rales. I suspect a viral etiology as she has had no improvement with antibiotic. I will prescribe a cough syrup and recommend she followup if not improving in the next several days.    Veryl Speak, MD 03/28/13 [phone]

## 2013-03-28 NOTE — Discharge Instructions (Signed)
Continue your Augmentin as prescribed.  Robitussin with codeine as prescribed as needed for cough.  Return to the emergency department if you develop chest pain, difficulty breathing, or other new or concerning symptoms.   Upper Respiratory Infection, Adult An upper respiratory infection (URI) is also sometimes known as the common cold. The upper respiratory tract includes the nose, sinuses, throat, trachea, and bronchi. Bronchi are the airways leading to the lungs. Most people improve within 1 week, but symptoms can last up to 2 weeks. A residual cough may last even longer.  CAUSES Many different viruses can infect the tissues lining the upper respiratory tract. The tissues become irritated and inflamed and often become very moist. Mucus production is also common. A cold is contagious. You can easily spread the virus to others by oral contact. This includes kissing, sharing a glass, coughing, or sneezing. Touching your mouth or nose and then touching a surface, which is then touched by another person, can also spread the virus. SYMPTOMS  Symptoms typically develop 1 to 3 days after you come in contact with a cold virus. Symptoms vary from person to person. They may include:  Runny nose.  Sneezing.  Nasal congestion.  Sinus irritation.  Sore throat.  Loss of voice (laryngitis).  Cough.  Fatigue.  Muscle aches.  Loss of appetite.  Headache.  Low-grade fever. DIAGNOSIS  You might diagnose your own cold based on familiar symptoms, since most people get a cold 2 to 3 times a year. Your caregiver can confirm this based on your exam. Most importantly, your caregiver can check that your symptoms are not due to another disease such as strep throat, sinusitis, pneumonia, asthma, or epiglottitis. Blood tests, throat tests, and X-rays are not necessary to diagnose a common cold, but they may sometimes be helpful in excluding other more serious diseases. Your caregiver will decide if any  further tests are required. RISKS AND COMPLICATIONS  You may be at risk for a more severe case of the common cold if you smoke cigarettes, have chronic heart disease (such as heart failure) or lung disease (such as asthma), or if you have a weakened immune system. The very young and very old are also at risk for more serious infections. Bacterial sinusitis, middle ear infections, and bacterial pneumonia can complicate the common cold. The common cold can worsen asthma and chronic obstructive pulmonary disease (COPD). Sometimes, these complications can require emergency medical care and may be life-threatening. PREVENTION  The best way to protect against getting a cold is to practice good hygiene. Avoid oral or hand contact with people with cold symptoms. Wash your hands often if contact occurs. There is no clear evidence that vitamin C, vitamin E, echinacea, or exercise reduces the chance of developing a cold. However, it is always recommended to get plenty of rest and practice good nutrition. TREATMENT  Treatment is directed at relieving symptoms. There is no cure. Antibiotics are not effective, because the infection is caused by a virus, not by bacteria. Treatment may include:  Increased fluid intake. Sports drinks offer valuable electrolytes, sugars, and fluids.  Breathing heated mist or steam (vaporizer or shower).  Eating chicken soup or other clear broths, and maintaining good nutrition.  Getting plenty of rest.  Using gargles or lozenges for comfort.  Controlling fevers with ibuprofen or acetaminophen as directed by your caregiver.  Increasing usage of your inhaler if you have asthma. Zinc gel and zinc lozenges, taken in the first 24 hours of the common cold,  can shorten the duration and lessen the severity of symptoms. Pain medicines may help with fever, muscle aches, and throat pain. A variety of non-prescription medicines are available to treat congestion and runny nose. Your caregiver  can make recommendations and may suggest nasal or lung inhalers for other symptoms.  HOME CARE INSTRUCTIONS   Only take over-the-counter or prescription medicines for pain, discomfort, or fever as directed by your caregiver.  Use a warm mist humidifier or inhale steam from a shower to increase air moisture. This may keep secretions moist and make it easier to breathe.  Drink enough water and fluids to keep your urine clear or pale yellow.  Rest as needed.  Return to work when your temperature has returned to normal or as your caregiver advises. You may need to stay home longer to avoid infecting others. You can also use a face mask and careful hand washing to prevent spread of the virus. SEEK MEDICAL CARE IF:   After the first few days, you feel you are getting worse rather than better.  You need your caregiver's advice about medicines to control symptoms.  You develop chills, worsening shortness of breath, or brown or red sputum. These may be signs of pneumonia.  You develop yellow or brown nasal discharge or pain in the face, especially when you bend forward. These may be signs of sinusitis.  You develop a fever, swollen neck glands, pain with swallowing, or white areas in the back of your throat. These may be signs of strep throat. SEEK IMMEDIATE MEDICAL CARE IF:   You have a fever.  You develop severe or persistent headache, ear pain, sinus pain, or chest pain.  You develop wheezing, a prolonged cough, cough up blood, or have a change in your usual mucus (if you have chronic lung disease).  You develop sore muscles or a stiff neck. Document Released: 08/01/2000 Document Revised: 04/30/2011 Document Reviewed: 06/09/2010 [facility] Patient Information 2014 [place], [place].

## 2013-03-28 NOTE — ED Notes (Signed)
Pt reports URI x 3 weeks.  Pt dx with bronchitis.  Reports that she is not getting any better.  Denies pain, denies fever.

## 2013-04-01 ENCOUNTER — Encounter: Payer: Self-pay | Admitting: Family Medicine

## 2013-04-13 ENCOUNTER — Encounter: Payer: Self-pay | Admitting: Physician Assistant

## 2013-04-13 ENCOUNTER — Other Ambulatory Visit: Payer: Self-pay | Admitting: Physician Assistant

## 2013-04-13 ENCOUNTER — Ambulatory Visit (INDEPENDENT_AMBULATORY_CARE_PROVIDER_SITE_OTHER): Payer: Commercial Managed Care - PPO | Admitting: Physician Assistant

## 2013-04-13 ENCOUNTER — Ambulatory Visit (HOSPITAL_BASED_OUTPATIENT_CLINIC_OR_DEPARTMENT_OTHER)
Admission: RE | Admit: 2013-04-13 | Discharge: 2013-04-13 | Disposition: A | Discharge status: Home / Self Care | Payer: Commercial Managed Care - PPO | Source: Ambulatory Visit | Attending: Physician Assistant | Admitting: Physician Assistant

## 2013-04-13 VITALS — BP 139/84 | HR 99 | Temp 98.1°F | Resp 18 | Wt 267.0 lb

## 2013-04-13 DIAGNOSIS — R0989 Other specified symptoms and signs involving the circulatory and respiratory systems: Secondary | ICD-10-CM | POA: Insufficient documentation

## 2013-04-13 DIAGNOSIS — R05 Cough: Secondary | ICD-10-CM

## 2013-04-13 DIAGNOSIS — J209 Acute bronchitis, unspecified: Secondary | ICD-10-CM

## 2013-04-13 DIAGNOSIS — R059 Cough, unspecified: Secondary | ICD-10-CM | POA: Insufficient documentation

## 2013-04-13 DIAGNOSIS — M47814 Spondylosis without myelopathy or radiculopathy, thoracic region: Secondary | ICD-10-CM | POA: Insufficient documentation

## 2013-04-13 MED ORDER — METHYLPREDNISOLONE (PAK) 4 MG PO TABS: ORAL_TABLET | ORAL | Status: DC

## 2013-04-13 MED ORDER — HYDROCODONE-HOMATROPINE 5-1.5 MG/5ML PO SYRP: 5.0000 mL | ORAL_SOLUTION | Freq: Four times a day (QID) | ORAL | Status: DC | PRN

## 2013-04-13 MED ORDER — LEVOFLOXACIN 500 MG PO TABS: 500.0000 mg | ORAL_TABLET | Freq: Every day | ORAL | Status: DC

## 2013-04-13 NOTE — Progress Notes (Signed)
Pre-visit discussion using our clinic review tool. No additional management support is needed unless otherwise documented below in the visit note.  

## 2013-04-13 NOTE — Patient Instructions (Signed)
Please go to [facility] for chest x-ray.  I will call you with your results.  Please take medications as prescribed.  Increase fluid intake.  Rest.  Use saline nasal spray.  Take Mucinex for congestion.  Place a humidifier in the bedroom.  Call or return to clinic if symptoms not improving.

## 2013-04-13 NOTE — Progress Notes (Signed)
Patient presents to clinic today c/o 2 weeks of chest congestion, productive cough, sinus pressure, sinus pain, ear pain and fatigue.  Patient states her cough has acutally been present for about 6 weeks.  Patient was seen on 03/05/13 by a PA at her work and was diagnosed with bronchitis.  Patient endorses being given a prescription for augmentin and a steroid shot.  Patient states her symptoms improved slightly but never complete;ly left.  Patient was seen in the ER on 03/28/13 for similar symptoms.  CXR was obtained and found to be within normal limits.  Patient diagnosed with Viral URI and given cough syrup with codeine.  Patient states symptoms have persisted. Endorses mild wheezing.  Denies pleuritic chest pain, fever, or history of asthma or allergy.  Patient does endorse history of acid reflux and occasional heartburn symptoms.  Patient not currently on medication for acid reflux.  Past Medical History  Diagnosis Date  . Thyroid disease     hypothyroidism  . Depression     Current Outpatient Prescriptions on File Prior to Visit  Medication Sig Dispense Refill  . ANTARA 90 MG CAPS Take 1 capsule by mouth daily.  30 capsule  5  . atorvastatin (LIPITOR) 20 MG tablet Take 1 tablet (20 mg total) by mouth daily.  90 tablet  3  . Calcium Carbonate-Vitamin D (CALCIUM 500 + D PO) Take 1 tablet by mouth 2 (two) times daily.        [name] levothyroxine (SYNTHROID, LEVOTHROID) 100 MCG tablet Take 1 tablet (100 mcg total) by mouth daily before breakfast.  30 tablet  11  . Multiple Vitamin (MULTIVITAMIN) tablet Take 1 tablet by mouth daily.        [name] venlafaxine XR (EFFEXOR-XR) 150 MG 24 hr capsule Take 1 capsule (150 mg total) by mouth daily.  90 capsule  3  . guaiFENesin-codeine 100-10 MG/5ML syrup Take 10 mLs by mouth every 6 (six) hours as needed for cough.  120 mL  0   Current Facility-Administered Medications on File Prior to Visit  Medication Dose Route Frequency Provider Last Rate Last Dose  . TDaP  (BOOSTRIX) injection 0.5 mL  0.5 mL Intramuscular Once Rosalita Chessman, DO        No Known Allergies  Family History  Problem Relation Age of Onset  . Depression    . Hyperlipidemia    . Arthritis    . Diabetes Maternal Aunt   . Breast cancer Paternal Grandmother   . Lung cancer Maternal Aunt     Nonsmoker  . Cancer Maternal Aunt     breast  . Breast cancer Maternal Aunt   . Cancer Maternal Aunt     lung(nonsmoker)  . Colon cancer Neg Hx   . Pancreatic cancer Neg Hx   . Stomach cancer Neg Hx     History   Social History  . Marital Status: Single    Spouse Name: N/A    Number of Children: N/A  . 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     Social History Main Topics  . Smoking status: Former Smoker -- 0.50 packs/day for 3 years    Types: Cigarettes    Quit date: 08/10/1990  . Smokeless tobacco: Never Used  . Alcohol Use: Yes  . Drug Use: No  . Sexual Activity: Not Currently    Partners: Male   Other Topics Concern  . None   Social History Narrative  Regular exercise - yes   Caffeine use: 2 cups of coffee daily    Review of Systems - See HPI.  All other ROS are negative.  BP 139/84  Pulse 99  Temp(Src) 98.1 F (36.7 C) (Oral)  Resp 18  Wt 267 lb (121.11 kg)  SpO2 97%  Physical Exam  Vitals reviewed. Constitutional: She is oriented to person, place, and time and well-developed, well-nourished, and in no distress.  HENT:  Head: Normocephalic and atraumatic.  Right Ear: External ear normal.  Left Ear: External ear normal.  Nose: Nose normal.  Mouth/Throat: Oropharynx is clear and moist. No oropharyngeal exudate.  TM within normal limits bilaterally.  TTP of sinuses noted on examination.  Eyes: Conjunctivae are normal. Pupils are equal, round, and reactive to light.  Neck: Neck supple.  Cardiovascular: Normal rate, regular rhythm, normal heart sounds and intact distal pulses.   Pulmonary/Chest: Effort  normal. No respiratory distress. She has wheezes. She has no rales. She exhibits no tenderness.  Crackles auscultated of LUL  Lymphadenopathy:    She has no cervical adenopathy.  Neurological: She is alert and oriented to person, place, and time. No cranial nerve deficit.  Skin: Skin is warm and dry.  Psychiatric: Affect normal.    Recent Results (from the past 2160 hour(s))  LIPID PANEL     Status: Abnormal   Collection Time    02/06/13 11:19 AM      Result Value Ref Range   Cholesterol 145  0 - 200 mg/dL   Comment: ATP III Classification       Desirable:  < 200 mg/dL               Borderline High:  200 - 239 mg/dL          High:  > = 240 mg/dL   Triglycerides 85.0  0.0 - 149.0 mg/dL   Comment: Normal:  <150 mg/dLBorderline High:  150 - 199 mg/dL   HDL 38.50 (*) >39.00 mg/dL   VLDL 17.0  0.0 - 40.0 mg/dL   LDL Cholesterol 90  0 - 99 mg/dL   Total CHOL/HDL Ratio 4     Comment:                Men          Women1/2 Average Risk     3.4          3.3Average Risk          5.0          4.42X Average Risk          9.6          7.13X Average Risk          15.0          11.0                      HEPATIC FUNCTION PANEL     Status: Abnormal   Collection Time    02/06/13 11:19 AM      Result Value Ref Range   Total Bilirubin 0.4  0.3 - 1.2 mg/dL   Bilirubin, Direct 0.0  0.0 - 0.3 mg/dL   Alkaline Phosphatase 53  39 - 117 U/L   AST 85 (*) 0 - 37 U/L   ALT 69 (*) 0 - 35 U/L   Total Protein 6.7  6.0 - 8.3 g/dL   Albumin 4.1  3.5 - 5.2 g/dL    Assessment/Plan:  Bronchitis with bronchospasm Will obtain CXR due to crackles on lung exam.  Rx Levaquin, Medrol dose pack.  Patient to continue Albuterol inhaler as prescribed. Rx Hycodan.  Increase fluid intake.  Rest.  Saline nasal spray.  Plain Mucinex.  Place a humidifier in the bedroom.  Discussed possible causes of chronic cough, if cough persists despite resolution of other URI symptoms.  Encouraged patient to monitor acid reflux symptoms.  Zantac  or prevacid if needed. F/U if symptoms are not improving.   I spent 20-25 minutes with patient including history, exam and medical decision making.

## 2013-04-14 DIAGNOSIS — J209 Acute bronchitis, unspecified: Secondary | ICD-10-CM

## 2013-04-14 HISTORY — DX: Acute bronchitis, unspecified: J20.9

## 2013-04-14 NOTE — Assessment & Plan Note (Signed)
Will obtain CXR due to crackles on lung exam.  Rx Levaquin, Medrol dose pack.  Patient to continue Albuterol inhaler as prescribed. Rx Hycodan.  Increase fluid intake.  Rest.  Saline nasal spray.  Plain Mucinex.  Place a humidifier in the bedroom.  Discussed possible causes of chronic cough, if cough persists despite resolution of other URI symptoms.  Encouraged patient to monitor acid reflux symptoms.  Zantac or prevacid if needed. F/U if symptoms are not improving.

## 2013-04-22 ENCOUNTER — Encounter: Payer: Self-pay | Admitting: Family Medicine

## 2013-04-22 ENCOUNTER — Ambulatory Visit (INDEPENDENT_AMBULATORY_CARE_PROVIDER_SITE_OTHER): Payer: Commercial Managed Care - PPO | Admitting: Family Medicine

## 2013-04-22 VITALS — BP 126/68 | HR 74 | Temp 98.1°F | Wt 269.0 lb

## 2013-04-22 DIAGNOSIS — E039 Hypothyroidism, unspecified: Secondary | ICD-10-CM

## 2013-04-22 DIAGNOSIS — J209 Acute bronchitis, unspecified: Secondary | ICD-10-CM

## 2013-04-22 DIAGNOSIS — N390 Urinary tract infection, site not specified: Secondary | ICD-10-CM

## 2013-04-22 DIAGNOSIS — J019 Acute sinusitis, unspecified: Secondary | ICD-10-CM

## 2013-04-22 DIAGNOSIS — E785 Hyperlipidemia, unspecified: Secondary | ICD-10-CM

## 2013-04-22 LAB — HEPATIC FUNCTION PANEL
ALBUMIN: 3.8 g/dL (ref 3.5–5.2)
ALT: 37 U/L — ABNORMAL HIGH (ref 0–35)
AST: 46 U/L — ABNORMAL HIGH (ref 0–37)
Alkaline Phosphatase: 50 U/L (ref 39–117)
BILIRUBIN TOTAL: 0.9 mg/dL (ref 0.3–1.2)
Bilirubin, Direct: 0.1 mg/dL (ref 0.0–0.3)
Total Protein: 6.7 g/dL (ref 6.0–8.3)

## 2013-04-22 LAB — BASIC METABOLIC PANEL
BUN: 14 mg/dL (ref 6–23)
CALCIUM: 8.9 mg/dL (ref 8.4–10.5)
CO2: 28 mEq/L (ref 19–32)
CREATININE: 0.8 mg/dL (ref 0.4–1.2)
Chloride: 104 mEq/L (ref 96–112)
GFR: 73.68 mL/min (ref >60.00)
GLUCOSE: 106 mg/dL — AB (ref 70–99)
Potassium: 4 mEq/L (ref 3.5–5.1)
Sodium: 138 mEq/L (ref 135–145)

## 2013-04-22 LAB — LIPID PANEL
CHOLESTEROL: 136 mg/dL (ref 0–200)
HDL: 44.5 mg/dL (ref >39.00)
LDL CALC: 75 mg/dL (ref 0–99)
TRIGLYCERIDES: 83 mg/dL (ref 0.0–149.0)
Total CHOL/HDL Ratio: 3
VLDL: 16.6 mg/dL (ref 0.0–40.0)

## 2013-04-22 LAB — POCT URINALYSIS DIPSTICK
Bilirubin, UA: NEGATIVE
Blood, UA: NEGATIVE
Glucose, UA: NEGATIVE
KETONES UA: NEGATIVE
Nitrite, UA: NEGATIVE
PH UA: 7.5
PROTEIN UA: NEGATIVE
SPEC GRAV UA: 1.01
Urobilinogen, UA: 0.2

## 2013-04-22 LAB — T3, FREE: T3, Free: 2.2 pg/mL — ABNORMAL LOW (ref 2.3–4.2)

## 2013-04-22 LAB — T4, FREE: FREE T4: 1.27 ng/dL (ref 0.60–1.60)

## 2013-04-22 LAB — TSH: TSH: 0.99 u[IU]/mL (ref 0.35–5.50)

## 2013-04-22 MED ORDER — AMOXICILLIN-POT CLAVULANATE 875-125 MG PO TABS: 1.0000 | ORAL_TABLET | Freq: Two times a day (BID) | ORAL | Status: DC

## 2013-04-22 MED ORDER — METHYLPREDNISOLONE ACETATE 40 MG/ML IJ SUSP
80.0000 mg | Freq: Once | INTRAMUSCULAR | Status: AC
  Administered 2013-04-22: 80 mg via INTRAMUSCULAR

## 2013-04-22 MED ORDER — PREDNISONE 10 MG PO TABS: ORAL_TABLET | ORAL | Status: DC

## 2013-04-22 NOTE — Patient Instructions (Signed)

## 2013-04-22 NOTE — Progress Notes (Signed)
Subjective:     Amy Mahoney is a 63 y.o. female who presents for evaluation of symptoms of a URI. Symptoms include bilateral ear pressure/pain, congestion, cough described as productive, nasal congestion and sinus pressure. Onset of symptoms was 6 weeks ago, and has been gradually worsening since that time. Treatment to date: antibiotics, cough suppressants and decongestants.  The following portions of the patient's history were reviewed and updated as appropriate:  She  has a past medical history of Thyroid disease and Depression. She  does not have any pertinent problems on file. She  has past surgical history that includes Cholecystectomy; Dilation and curettage of uterus; Tonsillectomy; and fibroids scrapping. Her family history includes Arthritis in an other family member; Breast cancer in her maternal aunt and paternal grandmother; Cancer in her maternal aunt and maternal aunt; Depression in an other family member; Diabetes in her maternal aunt; Hyperlipidemia in an other family member; Lung cancer in her maternal aunt. There is no history of Colon cancer, Pancreatic cancer, or Stomach cancer. She  reports that she quit smoking about 22 years ago. Her smoking use included Cigarettes. She has a 1.5 pack-year smoking history. She has never used smokeless tobacco. She reports that she drinks alcohol. She reports that she does not use illicit drugs. She has a current medication list which includes the following prescription(s): antara, atorvastatin, calcium carbonate-vitamin d, guaifenesin-codeine, hydrocodone-homatropine, levofloxacin, levothyroxine, methylprednisolone, multivitamin, and venlafaxine xr, and the following Facility-Administered Medications: tdap. Current Outpatient Prescriptions on File Prior to Visit  Medication Sig Dispense Refill  . ANTARA 90 MG CAPS Take 1 capsule by mouth daily.  30 capsule  5  . atorvastatin (LIPITOR) 20 MG tablet Take 1 tablet (20 mg total) by mouth daily.   90 tablet  3  . Calcium Carbonate-Vitamin D (CALCIUM 500 + D PO) Take 1 tablet by mouth 2 (two) times daily.        [name] guaiFENesin-codeine 100-10 MG/5ML syrup Take 10 mLs by mouth every 6 (six) hours as needed for cough.  120 mL  0  . HYDROcodone-homatropine (HYCODAN) 5-1.5 MG/5ML syrup Take 5 mLs by mouth every 6 (six) hours as needed for cough.  120 mL  0  . levofloxacin (LEVAQUIN) 500 MG tablet Take 1 tablet (500 mg total) by mouth daily.  7 tablet  0  . levothyroxine (SYNTHROID, LEVOTHROID) 100 MCG tablet Take 1 tablet (100 mcg total) by mouth daily before breakfast.  30 tablet  11  . methylPREDNIsolone (MEDROL DOSPACK) 4 MG tablet follow package directions  21 tablet  0  . Multiple Vitamin (MULTIVITAMIN) tablet Take 1 tablet by mouth daily.        [name] venlafaxine XR (EFFEXOR-XR) 150 MG 24 hr capsule Take 1 capsule (150 mg total) by mouth daily.  90 capsule  3   Current Facility-Administered Medications on File Prior to Visit  Medication Dose Route Frequency Provider Last Rate Last Dose  . TDaP (BOOSTRIX) injection 0.5 mL  0.5 mL Intramuscular Once Rosalita Chessman, DO       She has No Known Allergies..  Review of Systems Pertinent items are noted in HPI.   Objective:    BP 126/68  Pulse 74  Temp(Src) 98.1 F (36.7 C) (Oral)  Wt 269 lb (122.018 kg)  SpO2 97% General appearance: alert, cooperative, appears stated age and no distress Ears: fluid b/l Nose: green discharge, moderate congestion, turbinates red, swollen, sinus tenderness bilateral Throat: abnormal findings: mild oropharyngeal erythema Neck: mild anterior cervical adenopathy,  supple, symmetrical, trachea midline and thyroid not enlarged, symmetric, no tenderness/mass/nodules Lungs: wheezes bilaterally Heart: S1, S2 normal Extremities: extremities normal, atraumatic, no cyanosis or edema   Assessment:    bronchitis and sinusitis   Plan:    Discussed the diagnosis and treatment of sinusitis. Suggested symptomatic OTC  remedies. Augmentin per orders. Nasal steroids per orders. Follow up as needed.   1. Unspecified hypothyroidism con't meds - TSH - T4, free - T3, free  2. Other and unspecified hyperlipidemia con't med - Basic metabolic panel - Hepatic function panel - Lipid panel - POCT urinalysis dipstick  3. Sinusitis, acute See above  4. Acute bronchitis See above - predniSONE (DELTASONE) 10 MG tablet; 3 po qd for 3 days then 2 po qd for 3 days the 1 po qd for 3 days  Dispense: 18 tablet; Refill: 0 - amoxicillin-clavulanate (AUGMENTIN) 875-125 MG per tablet; Take 1 tablet by mouth 2 (two) times daily.  Dispense: 20 tablet; Refill: 0

## 2013-04-22 NOTE — Progress Notes (Signed)
Pre-visit discussion using our clinic review tool. No additional management support is needed unless otherwise documented below in the visit note.  

## 2013-04-22 NOTE — Addendum Note (Signed)
Addended by: Modena Morrow D on: 04/22/2013 04:29 PM   Modules accepted: Orders

## 2013-04-23 LAB — URINE CULTURE
Colony Count: NO GROWTH
Organism ID, Bacteria: NO GROWTH

## 2013-05-06 ENCOUNTER — Encounter: Payer: Self-pay | Admitting: Family Medicine

## 2013-05-06 ENCOUNTER — Other Ambulatory Visit: Payer: Self-pay | Admitting: Family Medicine

## 2013-05-06 DIAGNOSIS — R059 Cough, unspecified: Secondary | ICD-10-CM

## 2013-05-06 DIAGNOSIS — R05 Cough: Secondary | ICD-10-CM

## 2013-05-06 MED ORDER — GUAIFENESIN-CODEINE 100-10 MG/5ML PO SYRP
ORAL_SOLUTION | ORAL | Status: DC
Start: 2013-05-06 — End: 2013-06-25

## 2013-05-11 MED ORDER — LEVOTHYROXINE SODIUM 100 MCG PO TABS: 100.0000 ug | ORAL_TABLET | Freq: Every day | ORAL | Status: DC

## 2013-05-11 NOTE — Addendum Note (Signed)
Addended by: Kris Hartmann on: 05/11/2013 10:15 AM   Modules accepted: Orders

## 2013-05-11 NOTE — Telephone Encounter (Signed)
Pt would like a 90 day supple of the antara. Please advise.

## 2013-05-14 ENCOUNTER — Other Ambulatory Visit: Payer: Self-pay | Admitting: Family Medicine

## 2013-05-14 MED ORDER — ANTARA 90 MG PO CAPS: 1.0000 | ORAL_CAPSULE | Freq: Every day | ORAL | Status: DC

## 2013-05-14 MED ORDER — LEVOTHYROXINE SODIUM 100 MCG PO TABS: 100.0000 ug | ORAL_TABLET | Freq: Every day | ORAL | Status: DC

## 2013-05-14 NOTE — Addendum Note (Signed)
Addended by: Kris Hartmann on: 05/14/2013 08:51 AM   Modules accepted: Orders

## 2013-05-14 NOTE — Telephone Encounter (Signed)
Med filled.  

## 2013-05-23 ENCOUNTER — Other Ambulatory Visit: Payer: Self-pay | Admitting: Family Medicine

## 2013-06-05 LAB — HM MAMMOGRAPHY

## 2013-06-06 ENCOUNTER — Other Ambulatory Visit: Payer: Self-pay | Admitting: Family Medicine

## 2013-06-25 ENCOUNTER — Ambulatory Visit (INDEPENDENT_AMBULATORY_CARE_PROVIDER_SITE_OTHER): Payer: Commercial Managed Care - PPO | Admitting: Family Medicine

## 2013-06-25 ENCOUNTER — Encounter: Payer: Self-pay | Admitting: Family Medicine

## 2013-06-25 VITALS — BP 120/82 | HR 90 | Temp 98.5°F | Wt 255.2 lb

## 2013-06-25 DIAGNOSIS — W57XXXA Bitten or stung by nonvenomous insect and other nonvenomous arthropods, initial encounter: Secondary | ICD-10-CM

## 2013-06-25 DIAGNOSIS — IMO0001 Reserved for inherently not codable concepts without codable children: Secondary | ICD-10-CM

## 2013-06-25 MED ORDER — CEFTRIAXONE SODIUM 1 G IJ SOLR
1.0000 g | Freq: Once | INTRAMUSCULAR | Status: AC
  Administered 2013-06-25: 1 g via INTRAMUSCULAR

## 2013-06-25 MED ORDER — CEPHALEXIN 500 MG PO CAPS: 500.0000 mg | ORAL_CAPSULE | Freq: Two times a day (BID) | ORAL | Status: DC

## 2013-06-25 NOTE — Patient Instructions (Signed)
Insect Bite  Mosquitoes, flies, fleas, bedbugs, and many other insects can bite. Insect bites are different from insect stings. A sting is when venom is injected into the skin. Some insect bites can transmit infectious diseases.  SYMPTOMS   Insect bites usually turn red, swell, and itch for 2 to 4 days. They often go away on their own.  TREATMENT   Your caregiver may prescribe antibiotic medicines if a bacterial infection develops in the bite.  HOME CARE INSTRUCTIONS   Do not scratch the bite area.   Keep the bite area clean and dry. Wash the bite area thoroughly with soap and water.   Put ice or cool compresses on the bite area.   Put ice in a plastic bag.   Place a towel between your skin and the bag.   Leave the ice on for 20 minutes, 4 times a day for the first 2 to 3 days, or as directed.   You may apply a baking soda paste, cortisone cream, or calamine lotion to the bite area as directed by your caregiver. This can help reduce itching and swelling.   Only take over-the-counter or prescription medicines as directed by your caregiver.   If you are given antibiotics, take them as directed. Finish them even if you start to feel better.  You may need a tetanus shot if:   You cannot remember when you had your last tetanus shot.   You have never had a tetanus shot.   The injury broke your skin.  If you get a tetanus shot, your arm may swell, get red, and feel warm to the touch. This is common and not a problem. If you need a tetanus shot and you choose not to have one, there is a rare chance of getting tetanus. Sickness from tetanus can be serious.  SEEK IMMEDIATE MEDICAL CARE IF:    You have increased pain, redness, or swelling in the bite area.   You see a red line on the skin coming from the bite.   You have a fever.   You have joint pain.   You have a headache or neck pain.   You have unusual weakness.   You have a rash.   You have chest pain or shortness of breath.    You have abdominal pain, nausea, or vomiting.   You feel unusually tired or sleepy.  MAKE SURE YOU:    Understand these instructions.   Will watch your condition.   Will get help right away if you are not doing well or get worse.  Document Released: 03/15/2004 Document Revised: 04/30/2011 Document Reviewed: 09/06/2010  [employer] Patient Information 2014 [employer]

## 2013-06-25 NOTE — Progress Notes (Signed)
Pre visit review using our clinic review tool, if applicable. No additional management support is needed unless otherwise documented below in the visit note. 

## 2013-06-25 NOTE — Progress Notes (Signed)
   Subjective:    Patient ID: Amy Mahoney, female    DOB: 08-16-1950, 63 y.o.   MRN: [id number]  HPI Pt here with ? Bug bite lat R low leg.  + itchy.  reddness is spreading.   Review of Systems As above    Objective:   Physical Exam BP 120/82  Pulse 90  Temp(Src) 98.5 F (36.9 C) (Oral)  Wt 255 lb 3.2 oz (115.758 kg)  SpO2 95% General appearance: alert, cooperative, appears stated age and no distress Skin: erythema - lower leg(s) right and papular - lower leg(s) right        Assessment & Plan:  1. Bug bite with infection  - cephALEXin (KEFLEX) 500 MG capsule; Take 1 capsule (500 mg total) by mouth 2 (two) times daily.  Dispense: 20 capsule; Refill: 0 - cefTRIAXone (ROCEPHIN) injection 1 g; Inject 1 g into the muscle once.

## 2013-06-29 ENCOUNTER — Telehealth: Payer: Self-pay

## 2013-06-29 NOTE — Telephone Encounter (Signed)
Medication and allergies:  Reviewed and updated  90 day supply/mail order: n/a Local pharmacy:  [id number] - [place], [place]   Immunizations due:  UTD   A/P: No changes to personal, family history or past surgical hx PAP- 04/30/12- normal per patient, no results in chart; would like a pap with this upcoming appointment [facility]- 01/01/13- adenomatous polyps- repeat in 5 years (12/2017) [facility]- 06/05/13- normal per patient Flu- did not receive Tdap- 08/01/10 Shingles-08/01/10  To Discuss with Provider: Still has a cough since February.

## 2013-06-30 ENCOUNTER — Encounter: Payer: Self-pay | Admitting: Family Medicine

## 2013-06-30 ENCOUNTER — Other Ambulatory Visit (HOSPITAL_COMMUNITY)
Admission: RE | Admit: 2013-06-30 | Discharge: 2013-06-30 | Disposition: A | Discharge status: Home / Self Care | Payer: Commercial Managed Care - PPO | Source: Ambulatory Visit | Attending: Family Medicine | Admitting: Family Medicine

## 2013-06-30 ENCOUNTER — Ambulatory Visit (INDEPENDENT_AMBULATORY_CARE_PROVIDER_SITE_OTHER): Payer: Commercial Managed Care - PPO | Admitting: Family Medicine

## 2013-06-30 VITALS — BP 120/78 | HR 70 | Temp 98.1°F | Ht 66.0 in | Wt 257.0 lb

## 2013-06-30 DIAGNOSIS — K219 Gastro-esophageal reflux disease without esophagitis: Secondary | ICD-10-CM

## 2013-06-30 DIAGNOSIS — Z1151 Encounter for screening for human papillomavirus (HPV): Secondary | ICD-10-CM | POA: Insufficient documentation

## 2013-06-30 DIAGNOSIS — R05 Cough: Secondary | ICD-10-CM

## 2013-06-30 DIAGNOSIS — Z0000 Encounter for general adult medical examination without abnormal findings: Secondary | ICD-10-CM

## 2013-06-30 DIAGNOSIS — R739 Hyperglycemia, unspecified: Secondary | ICD-10-CM

## 2013-06-30 DIAGNOSIS — R059 Cough, unspecified: Secondary | ICD-10-CM

## 2013-06-30 DIAGNOSIS — E785 Hyperlipidemia, unspecified: Secondary | ICD-10-CM

## 2013-06-30 DIAGNOSIS — Z01419 Encounter for gynecological examination (general) (routine) without abnormal findings: Secondary | ICD-10-CM | POA: Insufficient documentation

## 2013-06-30 DIAGNOSIS — R053 Chronic cough: Secondary | ICD-10-CM

## 2013-06-30 DIAGNOSIS — E039 Hypothyroidism, unspecified: Secondary | ICD-10-CM

## 2013-06-30 DIAGNOSIS — R8290 Unspecified abnormal findings in urine: Secondary | ICD-10-CM

## 2013-06-30 DIAGNOSIS — R82998 Other abnormal findings in urine: Secondary | ICD-10-CM

## 2013-06-30 DIAGNOSIS — Z124 Encounter for screening for malignant neoplasm of cervix: Secondary | ICD-10-CM

## 2013-06-30 DIAGNOSIS — R7309 Other abnormal glucose: Secondary | ICD-10-CM

## 2013-06-30 LAB — CBC WITH DIFFERENTIAL/PLATELET
BASOS ABS: 0 10*3/uL (ref 0.0–0.1)
Basophils Relative: 0.6 % (ref 0.0–3.0)
EOS ABS: 0.2 10*3/uL (ref 0.0–0.7)
Eosinophils Relative: 4.5 % (ref 0.0–5.0)
HEMATOCRIT: 41.1 % (ref 36.0–46.0)
HEMOGLOBIN: 13.8 g/dL (ref 12.0–15.0)
LYMPHS ABS: 1.7 10*3/uL (ref 0.7–4.0)
LYMPHS PCT: 32.1 % (ref 12.0–46.0)
MCHC: 33.6 g/dL (ref 30.0–36.0)
MCV: 90.7 fl (ref 78.0–100.0)
MONOS PCT: 7.4 % (ref 3.0–12.0)
Monocytes Absolute: 0.4 10*3/uL (ref 0.1–1.0)
Neutro Abs: 2.9 10*3/uL (ref 1.4–7.7)
Neutrophils Relative %: 55.4 % (ref 43.0–77.0)
PLATELETS: 276 10*3/uL (ref 150.0–400.0)
RBC: 4.53 Mil/uL (ref 3.87–5.11)
RDW: 14 % (ref 11.5–15.5)
WBC: 5.2 10*3/uL (ref 4.0–10.5)

## 2013-06-30 LAB — POCT URINALYSIS DIPSTICK
Bilirubin, UA: NEGATIVE
Glucose, UA: NEGATIVE
KETONES UA: NEGATIVE
Nitrite, UA: NEGATIVE
PROTEIN UA: NEGATIVE
RBC UA: NEGATIVE
Spec Grav, UA: <=1.005
UROBILINOGEN UA: 0.2
pH, UA: 7.5

## 2013-06-30 LAB — LIPID PANEL
Cholesterol: 145 mg/dL (ref 0–200)
HDL: 37.6 mg/dL — ABNORMAL LOW (ref >39.00)
LDL CALC: 87 mg/dL (ref 0–99)
Total CHOL/HDL Ratio: 4
Triglycerides: 102 mg/dL (ref 0.0–149.0)
VLDL: 20.4 mg/dL (ref 0.0–40.0)

## 2013-06-30 LAB — BASIC METABOLIC PANEL
BUN: 11 mg/dL (ref 6–23)
CALCIUM: 9.4 mg/dL (ref 8.4–10.5)
CO2: 28 meq/L (ref 19–32)
Chloride: 104 mEq/L (ref 96–112)
Creatinine, Ser: 0.8 mg/dL (ref 0.4–1.2)
GFR: 73.63 mL/min (ref >60.00)
GLUCOSE: 104 mg/dL — AB (ref 70–99)
Potassium: 3.9 mEq/L (ref 3.5–5.1)
Sodium: 139 mEq/L (ref 135–145)

## 2013-06-30 LAB — HEPATIC FUNCTION PANEL
ALT: 37 U/L — ABNORMAL HIGH (ref 0–35)
AST: 47 U/L — AB (ref 0–37)
Albumin: 4 g/dL (ref 3.5–5.2)
Alkaline Phosphatase: 50 U/L (ref 39–117)
BILIRUBIN TOTAL: 0.6 mg/dL (ref 0.2–1.2)
Bilirubin, Direct: 0 mg/dL (ref 0.0–0.3)
Total Protein: 6.9 g/dL (ref 6.0–8.3)

## 2013-06-30 LAB — TSH: TSH: 0.09 u[IU]/mL — ABNORMAL LOW (ref 0.35–4.50)

## 2013-06-30 LAB — HEMOGLOBIN A1C: Hgb A1c MFr Bld: 6 % (ref 4.6–6.5)

## 2013-06-30 MED ORDER — BECLOMETHASONE DIPROPIONATE 40 MCG/ACT IN AERS: 2.0000 | INHALATION_SPRAY | Freq: Two times a day (BID) | RESPIRATORY_TRACT | Status: DC

## 2013-06-30 MED ORDER — OMEPRAZOLE 20 MG PO CPDR: 20.0000 mg | DELAYED_RELEASE_CAPSULE | Freq: Every day | ORAL | Status: DC

## 2013-06-30 MED ORDER — ANTARA 90 MG PO CAPS: ORAL_CAPSULE | ORAL | Status: DC

## 2013-06-30 MED ORDER — LEVOTHYROXINE SODIUM 100 MCG PO TABS: ORAL_TABLET | ORAL | Status: DC

## 2013-06-30 NOTE — Addendum Note (Signed)
Addended by: Ewing Schlein on: 06/30/2013 10:17 AM   Modules accepted: Orders

## 2013-06-30 NOTE — Progress Notes (Signed)
Subjective:     Amy Mahoney is a 63 y.o. female and is here for a comprehensive physical exam. The patient reports problems - cough, productive.   No fever , chills.  No chest pain/ tightness.    History   Social History  . Marital Status: Single    Spouse Name: N/A    Number of Children: N/A  . 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     Social History Main Topics  . Smoking status: Former Smoker -- 0.50 packs/day for 3 years    Types: Cigarettes    Quit date: 08/10/1990  . Smokeless tobacco: Never Used  . Alcohol Use: Yes  . Drug Use: No  . Sexual Activity: Not Currently    Partners: Male   Other Topics Concern  . Not on file   Social History Narrative   Regular exercise - yes   Caffeine use: 2 cups of coffee daily   Health Maintenance  Topic Date Due  . Influenza Vaccine  09/19/2013  . Pap Smear  05/01/2015  . Mammogram  06/06/2015  . Colonoscopy  01/01/2018  . Tetanus/tdap  07/31/2020  . Zostavax  Completed    The following portions of the patient's history were reviewed and updated as appropriate:  She  has a past medical history of Thyroid disease; Depression; and Spider bite. She  does not have any pertinent problems on file. She  has past surgical history that includes Cholecystectomy; Dilation and curettage of uterus; Tonsillectomy; and fibroids scrapping. Her family history includes Arthritis in an other family member; Breast cancer in her maternal aunt and paternal grandmother; Cancer in her maternal aunt and maternal aunt; Depression in an other family member; Diabetes in her maternal aunt; Hyperlipidemia in an other family member; Lung cancer in her maternal aunt. There is no history of Colon cancer, Pancreatic cancer, or Stomach cancer. She  reports that she quit smoking about 22 years ago. Her smoking use included Cigarettes. She has a 1.5 pack-year smoking history. She has never used smokeless tobacco. She  reports that she drinks alcohol. She reports that she does not use illicit drugs. She has a current medication list which includes the following prescription(s): antara, atorvastatin, calcium carbonate-vitamin d, levothyroxine, multivitamin, and venlafaxine xr. Current Outpatient Prescriptions on File Prior to Visit  Medication Sig Dispense Refill  . atorvastatin (LIPITOR) 20 MG tablet Take 1 tablet (20 mg total) by mouth daily.  90 tablet  3  . Calcium Carbonate-Vitamin D (CALCIUM 500 + D PO) Take 1 tablet by mouth 2 (two) times daily.        . Multiple Vitamin (MULTIVITAMIN) tablet Take 1 tablet by mouth daily.        [name] venlafaxine XR (EFFEXOR-XR) 150 MG 24 hr capsule Take 1 capsule (150 mg total) by mouth daily.  90 capsule  3   No current facility-administered medications on file prior to visit.   She has No Known Allergies..  Review of Systems   Objective:    BP 120/78  Pulse 70  Temp(Src) 98.1 F (36.7 C) (Oral)  Ht 5\' 6"  (1.676 m)  Wt 257 lb (116.574 kg)  BMI 41.50 kg/m2  SpO2 98% General appearance: alert, cooperative, appears stated age and no distress Head: Normocephalic, without obvious abnormality, atraumatic Eyes: conjunctivae/corneas clear. PERRL, EOM's intact. Fundi benign. Ears: normal TM's and external ear canals both ears Nose: Nares normal. Septum midline. Mucosa normal. No drainage or  sinus tenderness. Throat: lips, mucosa, and tongue normal; teeth and gums normal Neck: no adenopathy, no carotid bruit, no JVD, supple, symmetrical, trachea midline and thyroid not enlarged, symmetric, no tenderness/mass/nodules Back: symmetric, no curvature. ROM normal. No CVA tenderness. Lungs: clear to auscultation bilaterally Breasts: normal appearance, no masses or tenderness Heart: regular rate and rhythm, S1, S2 normal, no murmur, click, rub or gallop Abdomen: soft, non-tender; bowel sounds normal; no masses,  no organomegaly Pelvic: cervix normal in appearance, external  genitalia normal, no adnexal masses or tenderness, no cervical motion tenderness, rectovaginal septum normal, uterus normal size, shape, and consistency, vagina normal without discharge and pap done Extremities: extremities normal, atraumatic, no cyanosis or edema Pulses: 2+ and symmetric Skin: Skin color, texture, turgor normal. No rashes or lesions Lymph nodes: Cervical, supraclavicular, and axillary nodes normal. Neurologic: Alert and oriented X 3, normal strength and tone. Normal symmetric reflexes. Normal coordination and gait Psych- no depression, no anxiety      Assessment:    Healthy female exam.      Plan:    ghm utd Check labs See After Visit Summary for Counseling Recommendations \  1. Other and unspecified hyperlipidemia Check labs, con't meds - ANTARA 90 MG CAPS; TAKE 1 CAPSULE BY MOUTH DAILY.  Dispense: 90 capsule; Refill: 3 - Hepatic function panel - Lipid panel  2. Unspecified hypothyroidism Check labs, con't meds  - levothyroxine (SYNTHROID, LEVOTHROID) 100 MCG tablet; TAKE 1 TABLET (100 MCG TOTAL) BY MOUTH DAILY BEFORE BREAKFAST.  Dispense: 90 tablet; Refill: 3 - TSH  3. Preventative health care  - Basic metabolic panel - CBC with Differential - POCT urinalysis dipstick - Hemoglobin A1c  4. Hyperglycemia  - Hemoglobin A1c  5. Chronic cough gerd vs allergies  con't claritin Consider xray / pulm if no better - beclomethasone (QVAR) 40 MCG/ACT inhaler; Inhale 2 puffs into the lungs 2 (two) times daily.  Dispense: 1 Inhaler; Refill: 12  6. GERD (gastroesophageal reflux disease)  - omeprazole (PRILOSEC) 20 MG capsule; Take 1 capsule (20 mg total) by mouth daily.  Dispense: 30 capsule; Refill: 3

## 2013-06-30 NOTE — Patient Instructions (Signed)

## 2013-06-30 NOTE — Progress Notes (Signed)
Pre visit review using our clinic review tool, if applicable. No additional management support is needed unless otherwise documented below in the visit note. 

## 2013-06-30 NOTE — Addendum Note (Signed)
Addended by: Modena Morrow D on: 06/30/2013 03:14 PM   Modules accepted: Orders

## 2013-07-02 ENCOUNTER — Other Ambulatory Visit: Payer: Self-pay

## 2013-07-02 LAB — URINE CULTURE
COLONY COUNT: NO GROWTH
ORGANISM ID, BACTERIA: NO GROWTH

## 2013-07-02 MED ORDER — LEVOTHYROXINE SODIUM 88 MCG PO TABS: 88.0000 ug | ORAL_TABLET | Freq: Every day | ORAL | Status: DC

## 2013-09-17 ENCOUNTER — Ambulatory Visit (INDEPENDENT_AMBULATORY_CARE_PROVIDER_SITE_OTHER): Payer: Commercial Managed Care - PPO | Admitting: Medical

## 2013-09-17 VITALS — BP 103/70 | HR 85 | Temp 98.3°F | Wt 254.0 lb

## 2013-09-17 DIAGNOSIS — R6883 Chills (without fever): Secondary | ICD-10-CM

## 2013-09-17 DIAGNOSIS — R5381 Other malaise: Secondary | ICD-10-CM

## 2013-09-17 DIAGNOSIS — R059 Cough, unspecified: Secondary | ICD-10-CM

## 2013-09-17 DIAGNOSIS — R05 Cough: Secondary | ICD-10-CM

## 2013-09-17 DIAGNOSIS — R5383 Other fatigue: Secondary | ICD-10-CM

## 2013-09-17 HISTORY — DX: Cough, unspecified: R05.9

## 2013-09-17 LAB — CBC WITH DIFFERENTIAL/PLATELET
BASOS ABS: 0 10*3/uL (ref 0.0–0.1)
Basophils Relative: 0.3 % (ref 0.0–3.0)
Eosinophils Absolute: 0.3 10*3/uL (ref 0.0–0.7)
Eosinophils Relative: 2.9 % (ref 0.0–5.0)
HEMATOCRIT: 38.5 % (ref 36.0–46.0)
Hemoglobin: 12.8 g/dL (ref 12.0–15.0)
LYMPHS ABS: 2.4 10*3/uL (ref 0.7–4.0)
LYMPHS PCT: 27.7 % (ref 12.0–46.0)
MCHC: 33.3 g/dL (ref 30.0–36.0)
MCV: 90.1 fl (ref 78.0–100.0)
Monocytes Absolute: 0.4 10*3/uL (ref 0.1–1.0)
Monocytes Relative: 4.7 % (ref 3.0–12.0)
Neutro Abs: 5.6 10*3/uL (ref 1.4–7.7)
Neutrophils Relative %: 64.4 % (ref 43.0–77.0)
Platelets: 300 10*3/uL (ref 150.0–400.0)
RBC: 4.28 Mil/uL (ref 3.87–5.11)
RDW: 13.7 % (ref 11.5–15.5)
WBC: 8.8 10*3/uL (ref 4.0–10.5)

## 2013-09-17 MED ORDER — ALBUTEROL SULFATE HFA 108 (90 BASE) MCG/ACT IN AERS: 2.0000 | INHALATION_SPRAY | Freq: Four times a day (QID) | RESPIRATORY_TRACT | Status: DC | PRN

## 2013-09-17 MED ORDER — PREDNISONE 20 MG PO TABS: ORAL_TABLET | ORAL | Status: DC

## 2013-09-17 MED ORDER — BENZONATATE 100 MG PO CAPS: 100.0000 mg | ORAL_CAPSULE | Freq: Three times a day (TID) | ORAL | Status: DC | PRN

## 2013-09-17 NOTE — Patient Instructions (Signed)
Please take prednisone prescription as written for possible asthma vs copd. Continue qvar inhaler and making proair available. Note over next 4 days if cough stops while on prednisone. Also am writing benzonatate for cough. Would recommend use benzonatate as advised after course of prednisone if cough  were to return. Please get lab work today(CBC). And I am placing cxr order to be done at [facility] Follow up 2wks or as needed. We will call you on results when they are in.

## 2013-09-17 NOTE — Progress Notes (Signed)
Pre visit review using our clinic review tool, if applicable. No additional management support is needed unless otherwise documented below in the visit note. 

## 2013-09-17 NOTE — Assessment & Plan Note (Signed)
Some recently so ordered cbc today. She attributes fatigue to sleep apnea and not sleeping well. She will see specialist soon.

## 2013-09-17 NOTE — Assessment & Plan Note (Signed)
Hx of cough on and off per pt. Considering asthma vs copd. Continue qvar and add proair. Will give brief 4 day course of prednisone to see if cough improved while on. Also get cxr to see possible walking pneumonia. She reported very brief occasional chill and some rib pain from excess cough. So will follow. Even if xray neg but if clinically worsening then would add antibiotic.

## 2013-09-17 NOTE — Progress Notes (Signed)
   Subjective:    Patient ID: Amy Mahoney, female    DOB: [birth year], 63 y.o.   MRN: [id number]  HPI  Pt has very dry cough for about 3 wks. A lot of cough at night. States since January or February on and off cough but never completely stopping. Former smoker stopped 2 years . Former heavy smoker years ago. Pt reports no wheezing with the cough. Reports recent mild chills(rare and brief.No fever, no sweats)  Pt states just recently some posterior rib region pain lt side after coughing one night aggressivley.  Pt is on qvar 40 mcg 2 inh po bid. Also pt was given prilosec to see if cough maybe reflux related. She thinks prilosec did not help. She denies allergic rhintiis symptoms.    Review of Systems  Constitutional: Positive for chills and fatigue. Negative for fever.       Rare mild transient chill. Fatigue she thinks from sleeping poorly. Apnea related.  Respiratory: Positive for cough. Negative for choking, chest tightness, shortness of breath, wheezing and stridor.        Dry and worse at night.  Cardiovascular: Negative for chest pain and palpitations.  Genitourinary: Negative.   Musculoskeletal:       Lt posterior lower rib region pain that she attributes  To coughing a lot.  Neurological: Negative.   Hematological: Negative for adenopathy. Does not bruise/bleed easily.       Objective:   Physical Exam General  Mental Status - Alert. General Appearance - Well groomed. Not in acute distress.  Skin Rashes- No Rashes.  HEENT Head- Normal. Ear Auditory Canal - Left- Normal. Right - Normal.Tympanic Membrane- Left- Normal. Right- Normal. Eye Sclera/Conjunctiva- Left- Normal. Right- Normal. Nose & Sinuses Nasal Mucosa- Left- Not Boggy or Congested. Right- Not Boggy or Congested. Mouth & Throat Lips: Upper Lip- Normal: no dryness, cracking, pallor, cyanosis, or vesicular eruption. Lower Lip-Normal: no dryness, cracking, pallor, cyanosis or vesicular eruption. Buccal Mucosa-  Bilateral- No Aphthous ulcers. Oropharynx- No Discharge or Erythema. Tonsils: Characteristics- Bilateral- No Erythema or Congestion. Size/Enlargement- Bilateral- No enlargement. Discharge- bilateral-None.  Neck Neck- Supple. No Masses.   Chest and Lung Exam Auscultation: Breath Sounds:-Even, unlabored, clear but mild shallow.  Cardiovascular Auscultation:Rythm- Regular.  Murmurs & Other Heart Sounds:Ausculatation of the heart reveal- No Murmurs.  Lymphatic Head & Neck General Head & Neck Lymphatics: Bilateral: Description- No Localized lymphadenopathy.  Thorax- no obvious pain on palpation posterior, lt side lower ribs.        Assessment & Plan:

## 2013-09-18 ENCOUNTER — Ambulatory Visit (HOSPITAL_BASED_OUTPATIENT_CLINIC_OR_DEPARTMENT_OTHER)
Admission: RE | Admit: 2013-09-18 | Discharge: 2013-09-18 | Disposition: A | Discharge status: Home / Self Care | Payer: Commercial Managed Care - PPO | Source: Ambulatory Visit | Attending: Medical | Admitting: Medical

## 2013-09-18 ENCOUNTER — Encounter: Payer: Self-pay | Admitting: Family Medicine

## 2013-09-18 DIAGNOSIS — R059 Cough, unspecified: Secondary | ICD-10-CM | POA: Diagnosis not present

## 2013-09-18 DIAGNOSIS — R05 Cough: Secondary | ICD-10-CM | POA: Insufficient documentation

## 2013-09-28 ENCOUNTER — Encounter: Payer: Self-pay | Admitting: *Deleted

## 2013-09-29 ENCOUNTER — Ambulatory Visit (INDEPENDENT_AMBULATORY_CARE_PROVIDER_SITE_OTHER): Payer: Commercial Managed Care - PPO | Admitting: Internal Medicine

## 2013-09-29 ENCOUNTER — Encounter: Payer: Self-pay | Admitting: Internal Medicine

## 2013-09-29 VITALS — BP 110/64 | HR 84 | Ht 66.0 in | Wt 257.0 lb

## 2013-09-29 DIAGNOSIS — R05 Cough: Secondary | ICD-10-CM

## 2013-09-29 DIAGNOSIS — G4733 Obstructive sleep apnea (adult) (pediatric): Secondary | ICD-10-CM

## 2013-09-29 DIAGNOSIS — R059 Cough, unspecified: Secondary | ICD-10-CM

## 2013-09-29 MED ORDER — BENZONATATE 200 MG PO CAPS: 200.0000 mg | ORAL_CAPSULE | Freq: Three times a day (TID) | ORAL | Status: DC | PRN

## 2013-09-29 NOTE — Progress Notes (Signed)
09/29/13- 66 yoF Former smoker followed for OSA complicated by  obesity, hypothyroid,  Pt last seen on 08/10/2010. Pt would like to re-establish care for new problem of cough. CPAP therapy continues to do well With CPAP 11/ advanced, used all night every night. New problem Cough-denies prior lung disease. Onset of cough in January without recognized acute event or infection. She had thought she had a bronchitis at first but she did not respond to 3 prednisone tapers, Qvar, several rounds of antibiotics. She has had only clear mucus or dry cough with no fever significant postnasal drip or constitutional symptoms. Not acutely short of breath. No wheezing. Cough has been worse around bedtime and early in the morning. No recognized effect of location including her home or work place, indoors or out. Symptoms persisted from January through the spring and summer without much change and without a lot of difference remembered while she was on prednisone. She has some history of reflux , sleeps on a Sleep Number  bed with head slightly elevated, avoids eating after 6 PM and is taking Prilosec once daily. She does not choke or strangle easily while eating and has not been waking in the middle of the night with coughing or recognized reflux.  Prior to Admission medications   Medication Sig Start Date End Date Taking? Authorizing Provider  albuterol (PROVENTIL HFA;VENTOLIN HFA) 108 (90 BASE) MCG/ACT inhaler Inhale 2 puffs into the lungs every 6 (six) hours as needed for wheezing or shortness of breath. 09/17/13  Yes Edward Saguier, PA-C  ANTARA 90 MG CAPS TAKE 1 CAPSULE BY MOUTH DAILY. 06/30/13  Yes Yvonne R Lowne, DO  atorvastatin (LIPITOR) 20 MG tablet Take 1 tablet (20 mg total) by mouth daily. 11/06/12  Yes Rosalita Chessman, DO  Calcium Carbonate-Vitamin D (CALCIUM 500 + D PO) Take 1 tablet by mouth 2 (two) times daily.     Yes Historical Provider, MD  levothyroxine (SYNTHROID, LEVOTHROID) 88 MCG tablet Take 1 tablet  (88 mcg total) by mouth daily. 07/02/13  Yes Rosalita Chessman, DO  Multiple Vitamin (MULTIVITAMIN) tablet Take 1 tablet by mouth daily.     Yes Historical Provider, MD  omeprazole (PRILOSEC) 20 MG capsule Take 1 capsule (20 mg total) by mouth daily. 06/30/13  Yes Yvonne R Lowne, DO  venlafaxine XR (EFFEXOR-XR) 150 MG 24 hr capsule Take 1 capsule (150 mg total) by mouth daily. 11/06/12  Yes Yvonne R Lowne, DO  beclomethasone (QVAR) 40 MCG/ACT inhaler Inhale 2 puffs into the lungs 2 (two) times daily. 06/30/13   Rosalita Chessman, DO  benzonatate (TESSALON) 200 MG capsule Take 1 capsule (200 mg total) by mouth 3 (three) times daily as needed for cough. 09/29/13   Deneise Lever, MD   Past Medical History  Diagnosis Date  . Thyroid disease     hypothyroidism  . Depression   . Spider bite   . Sleep apnea    Past Surgical History  Procedure Laterality Date  . Cholecystectomy    . Dilation and curettage of uterus    . Tonsillectomy    . Fibroids scrapping     Family History  Problem Relation Age of Onset  . Depression    . Hyperlipidemia    . Arthritis    . Diabetes Maternal Aunt   . Breast cancer Paternal Grandmother   . Lung cancer Maternal Aunt     Nonsmoker  . Cancer Maternal Aunt     breast  . Breast cancer Maternal  Aunt   . Cancer Maternal Aunt     lung(nonsmoker)  . Colon cancer Neg Hx   . Pancreatic cancer Neg Hx   . Stomach cancer Neg Hx    History   Social History  . Marital Status: Single    Spouse Name: N/A    Number of Children: N/A  . 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    [name]     Social History Main Topics  . Smoking status: Former Smoker -- 0.50 packs/day for 3 years    Types: Cigarettes    Quit date: 08/10/1990  . Smokeless tobacco: Never Used  . Alcohol Use: Yes     Comment: rarely  . Drug Use: No  . Sexual Activity: Not Currently    Partners: Male   Other Topics Concern  . Not on file   Social History  Narrative   Regular exercise - yes   Caffeine use: 2 cups of coffee daily   ROS-see HPI Constitutional:   No-   weight loss, night sweats, fevers, chills, fatigue, lassitude. HEENT:   No-  headaches, difficulty swallowing, tooth/dental problems, sore throat,       No-  sneezing, itching, ear ache, nasal congestion, post nasal drip,  CV:  No-   chest pain, orthopnea, PND, swelling in lower extremities, anasarca,                                                     dizziness, palpitations Resp: No-   shortness of breath with exertion or at rest.             +  productive cough,  N+ non-productive cough,  No- coughing up of blood.              No-   change in color of mucus.  No- wheezing.   Skin: No-   rash or lesions. GI:  No-   heartburn, indigestion, abdominal pain, nausea, vomiting, diarrhea,                 change in bowel habits, loss of appetite GU: No-   dysuria, change in color of urine, no urgency or frequency.  No- flank pain. MS:  No-   joint pain or swelling.  No- decreased range of motion.  No- back pain. Neuro-     nothing unusual Psych:  No- change in mood or affect. No depression or anxiety.  No memory loss.  OBJ- Physical Exam General- Alert, Oriented, Affect-appropriate, Distress- none acute Skin- rash-none, lesions- none, excoriation- none Lymphadenopathy- none Head- atraumatic            Eyes- Gross vision intact, PERRLA, conjunctivae and secretions clear            Ears- Hearing, canals-normal            Nose- Clear, no-Septal dev, mucus, polyps, erosion, perforation             Throat- Mallampati II , mucosa clear , drainage- none, tonsils- atrophic Neck- flexible , trachea midline, no stridor , thyroid nl, carotid no bruit Chest - symmetrical excursion , unlabored           Heart/CV- RRR , no murmur , no gallop  , no rub, nl s1 s2                           -  JVD- none , edema- none, stasis changes- none, varices- none           Lung- clear to P&A, wheeze- none,  cough- none , dullness-none, rub- none           Chest wall-  Abd- tender-no, distended-no, bowel sounds-present, HSM- no Br/ Gen/ Rectal- Not done, not indicated Extrem- cyanosis- none, clubbing, none, atrophy- none, strength- nl Neuro- grossly intact to observation   .

## 2013-09-29 NOTE — Patient Instructions (Signed)
Keep head of bed elevated a few inches  Don't eat for at least 2 hours before lying down  Take omeprazole/ Prilosec twice daily- before breakfast and before supper  Script for stronger benzonatate perles for cough as needed- sent  Continue your inhalers to use them up, then see how you do without them

## 2013-09-29 NOTE — Assessment & Plan Note (Signed)
She is satisfied that life is better with CPAP which she uses all night every night. She is not aware of snore or significant daytime tiredness. The pressure is comfortable at 11 and her machine is working well

## 2013-09-29 NOTE — Assessment & Plan Note (Signed)
Counseled weight loss would help sleep apnea

## 2013-09-29 NOTE — Assessment & Plan Note (Signed)
Her description of cough as new without trigger in January of 2015, is not specific enough yet to be helpful. Have to suspect she may have cyclical cough with more reflux than she realizes and we will focus on this first. She has not responded to prednisone. Plan-emphasized reflux precautions, elevate head of bed, increased Prilosec to twice daily before meals. Benzonatate 200 mg for trial

## 2013-10-05 ENCOUNTER — Other Ambulatory Visit: Payer: Self-pay | Admitting: Family Medicine

## 2013-10-10 ENCOUNTER — Other Ambulatory Visit: Payer: Self-pay | Admitting: Family Medicine

## 2013-10-13 ENCOUNTER — Encounter: Payer: Self-pay | Admitting: Family Medicine

## 2013-10-28 ENCOUNTER — Other Ambulatory Visit (INDEPENDENT_AMBULATORY_CARE_PROVIDER_SITE_OTHER): Payer: Commercial Managed Care - PPO

## 2013-10-28 DIAGNOSIS — E039 Hypothyroidism, unspecified: Secondary | ICD-10-CM

## 2013-10-28 LAB — TSH: TSH: 1.35 u[IU]/mL (ref 0.35–4.50)

## 2013-10-29 ENCOUNTER — Other Ambulatory Visit: Payer: Commercial Managed Care - PPO

## 2013-10-30 ENCOUNTER — Encounter: Payer: Self-pay | Admitting: Internal Medicine

## 2013-10-30 ENCOUNTER — Ambulatory Visit (INDEPENDENT_AMBULATORY_CARE_PROVIDER_SITE_OTHER): Payer: Commercial Managed Care - PPO | Admitting: Internal Medicine

## 2013-10-30 VITALS — BP 132/70 | HR 75 | Ht 66.0 in | Wt 260.0 lb

## 2013-10-30 DIAGNOSIS — J209 Acute bronchitis, unspecified: Secondary | ICD-10-CM

## 2013-10-30 DIAGNOSIS — Z23 Encounter for immunization: Secondary | ICD-10-CM

## 2013-10-30 DIAGNOSIS — G4733 Obstructive sleep apnea (adult) (pediatric): Secondary | ICD-10-CM

## 2013-10-30 MED ORDER — FLUTICASONE FUROATE-VILANTEROL 200-25 MCG/INH IN AEPB: 1.0000 | INHALATION_SPRAY | Freq: Every day | RESPIRATORY_TRACT | Status: DC

## 2013-10-30 NOTE — Patient Instructions (Signed)
We can continue CPAP 11/ Advanced  Pneumovax-23 pneumococcal vaccine  Get your flu shot at work when you go back  Stop Qvar  Sample x 2 Breo Ellipta 200 inhaler   1 puff then rinse mouth, one time daily, every day

## 2013-10-30 NOTE — Progress Notes (Signed)
09/29/13- 64 yoF Former smoker followed for OSA complicated by  obesity, hypothyroid,  Pt last seen on 08/10/2010. Pt would like to re-establish care for new problem of cough. CPAP therapy continues to do well With CPAP 11/ advanced, used all night every night. New problem Cough-denies prior lung disease. Onset of cough in January without recognized acute event or infection. She had thought she had a bronchitis at first but she did not respond to 3 prednisone tapers, Qvar, several rounds of antibiotics. She has had only clear mucus or dry cough with no fever significant postnasal drip or constitutional symptoms. Not acutely short of breath. No wheezing. Cough has been worse around bedtime and early in the morning. No recognized effect of location including her home or work place, indoors or out. Symptoms persisted from January through the spring and summer without much change and without a lot of difference remembered while she was on prednisone. She has some history of reflux , sleeps on a Sleep Number  bed with head slightly elevated, avoids eating after 6 PM and is taking Prilosec once daily. She does not choke or strangle easily while eating and has not been waking in the middle of the night with coughing or recognized reflux.  10/30/13- 69 yoF Former smoker followed for OSA, Cough, complicated by  obesity, hypothyroid, GERD CPAP 11/ Advanced FOLLOWS FOR: pt states she is still coughing, prod cough with clear to sometimes green mucus.  States her cough has improved some since stopping the prilosec BID.  Cough worse in morning.  Using Qvar 40, benzonatate. No significant reflux or postnasal drip noted. Cough prod white, no wheeze.  CXR 09/18/13 FINDINGS:  There is no edema or consolidation. The heart size and pulmonary  vascularity are normal. No adenopathy. No bone lesions.  IMPRESSION:  No edema or consolidation.  Electronically Signed  By: Lowella Grip M.D.  On: 09/18/2013 14:55     ROS-see HPI Constitutional:   No-   weight loss, night sweats, fevers, chills, fatigue, lassitude. HEENT:   No-  headaches, difficulty swallowing, tooth/dental problems, sore throat,       No-  sneezing, itching, ear ache, nasal congestion, post nasal drip,  CV:  No-   chest pain, orthopnea, PND, swelling in lower extremities, anasarca,                                                     dizziness, palpitations Resp: No-   shortness of breath with exertion or at rest.             +  productive cough,  + non-productive cough,  No- coughing up of blood.              No-   change in color of mucus.  No- wheezing.   Skin: No-   rash or lesions. GI:  No-   heartburn, indigestion, abdominal pain, nausea, vomiting, GU: . MS:  No-   joint pain or swelling.   Neuro-     nothing unusual Psych:  No- change in mood or affect. No depression or anxiety.  No memory loss.  OBJ- Physical Exam General- Alert, Oriented, Affect-appropriate, Distress- none acute, obese Skin- rash-none, lesions- none, excoriation- none Lymphadenopathy- none Head- atraumatic            Eyes- Gross vision intact, PERRLA,  conjunctivae and secretions clear            Ears- Hearing, canals-normal            Nose- Clear, no-Septal dev, mucus, polyps, erosion, perforation             Throat- Mallampati III-IV , mucosa clear , drainage- none, tonsils- atrophic Neck- flexible , trachea midline, no stridor , thyroid nl, carotid no bruit Chest - symmetrical excursion , unlabored           Heart/CV- RRR , no murmur , no gallop  , no rub, nl s1 s2                           - JVD- none , edema- none, stasis changes- none, varices- none           Lung- + mild bilateral rhonchi upper zones, wheeze- none, cough- none , dullness-none,                              rub- none           Chest wall-  Abd-  Br/ Gen/ Rectal- Not done, not indicated Extrem- cyanosis- none, clubbing, none, atrophy- none, strength- nl Neuro- grossly intact to  observation   .

## 2013-10-30 NOTE — Assessment & Plan Note (Signed)
Good compliance and control with CPAP 11. Uses every night and sleeps better.

## 2013-10-30 NOTE — Assessment & Plan Note (Signed)
Rhonchi on exam today clarifies cause of cough as persistent bronchitis/ bronchiolitis, probably post-viral originally. Plan- Replace Qvar 40 with 2 samples of Breo 200- discussed. Flu shot at work, Pneumovax-23 today as discussed.

## 2013-11-07 ENCOUNTER — Other Ambulatory Visit: Payer: Self-pay | Admitting: Family Medicine

## 2013-11-09 ENCOUNTER — Telehealth: Payer: Self-pay | Admitting: Internal Medicine

## 2013-11-09 MED ORDER — FLUTICASONE FUROATE-VILANTEROL 200-25 MCG/INH IN AEPB: 1.0000 | INHALATION_SPRAY | Freq: Every day | RESPIRATORY_TRACT | Status: DC

## 2013-11-09 NOTE — Telephone Encounter (Signed)
Called pt. Aware RX called in. Nothing further needed

## 2013-11-17 ENCOUNTER — Encounter: Payer: Self-pay | Admitting: Family Medicine

## 2013-11-17 MED ORDER — NONFORMULARY OR COMPOUNDED ITEM: Status: DC

## 2013-12-05 LAB — HEPATIC FUNCTION PANEL
ALT: 19 U/L (ref 7–35)
AST: 28 U/L (ref 13–35)
Alkaline Phosphatase: 57 U/L (ref 25–125)
Bilirubin, Direct: 0.1 mg/dL (ref 0.01–0.4)
Bilirubin, Total: 0.5 mg/dL

## 2013-12-05 LAB — BASIC METABOLIC PANEL
BUN: 15 mg/dL (ref 4–21)
Creatinine: 0.9 mg/dL (ref 0.5–1.1)
Glucose: 138 mg/dL
Potassium: 4.7 mmol/L (ref 3.4–5.3)
Sodium: 141 mmol/L (ref 137–147)

## 2013-12-05 LAB — LIPID PANEL
Cholesterol: 148 mg/dL (ref 0–200)
HDL: 48 mg/dL (ref 35–70)
LDL CALC: 87 mg/dL
LDL/HDL RATIO: 3.1
TRIGLYCERIDES: 67 mg/dL (ref 40–160)

## 2013-12-07 ENCOUNTER — Other Ambulatory Visit: Payer: Self-pay

## 2013-12-07 MED ORDER — LEVOTHYROXINE SODIUM 88 MCG PO TABS: 88.0000 ug | ORAL_TABLET | Freq: Every day | ORAL | Status: DC

## 2013-12-17 ENCOUNTER — Other Ambulatory Visit: Payer: Self-pay

## 2013-12-22 ENCOUNTER — Telehealth: Payer: Self-pay | Admitting: Internal Medicine

## 2013-12-22 NOTE — Telephone Encounter (Signed)
Patient c/o increased fatigue, cough w/ green phlegm and chest soreness from cough. Denies SOB.  Requests sooner appt with Dr Annamaria Boots than 12/31/13 Pt rescheduled to 12/24/13 at 11  Please advise Dr Annamaria Boots of any rec's for patient in meantime. Thanks.  No Known Allergies Current Outpatient Prescriptions on File Prior to Visit  Medication Sig Dispense Refill  . albuterol (PROVENTIL HFA;VENTOLIN HFA) 108 (90 BASE) MCG/ACT inhaler Inhale 2 puffs into the lungs every 6 (six) hours as needed for wheezing or shortness of breath. 1 Inhaler 0  . ANTARA 90 MG CAPS TAKE 1 CAPSULE BY MOUTH DAILY. 90 capsule 3  . atorvastatin (LIPITOR) 20 MG tablet TAKE 1 TABLET EVERY DAY 90 tablet 1  . beclomethasone (QVAR) 40 MCG/ACT inhaler Inhale 2 puffs into the lungs 2 (two) times daily. 1 Inhaler 12  . benzonatate (TESSALON) 200 MG capsule Take 1 capsule (200 mg total) by mouth 3 (three) times daily as needed for cough. 30 capsule 2  . Calcium Carbonate-Vitamin D (CALCIUM 500 + D PO) Take 1 tablet by mouth 2 (two) times daily.      . Fluticasone Furoate-Vilanterol (BREO ELLIPTA) 200-25 MCG/INH AEPB Inhale 1 puff into the lungs daily. 60 each 5  . levothyroxine (SYNTHROID, LEVOTHROID) 88 MCG tablet Take 1 tablet (88 mcg total) by mouth daily before breakfast. 30 tablet 11  . Multiple Vitamin (MULTIVITAMIN) tablet Take 1 tablet by mouth daily.      . NONFORMULARY OR COMPOUNDED ITEM Lab please draw a 1) Lipid panel 2) Hepatic Panel 3) BMP. Dx code: 272.4 1 each 0  . venlafaxine XR (EFFEXOR-XR) 150 MG 24 hr capsule TAKE ONE CAPSULE EVERY DAY 90 capsule 3   No current facility-administered medications on file prior to visit.

## 2013-12-22 NOTE — Telephone Encounter (Signed)
Offer Zpak and suggest otc delsym if needed for cough

## 2013-12-22 NOTE — Telephone Encounter (Signed)
lmomtcb x1 

## 2013-12-23 MED ORDER — AZITHROMYCIN 250 MG PO TABS
ORAL_TABLET | ORAL | Status: DC
Start: 2013-12-23 — End: 2013-12-26

## 2013-12-23 NOTE — Telephone Encounter (Signed)
Called, spoke with pt - Discussed below recs per Dr. Annamaria Boots with her.  She verbalized understanding, is aware zpak sent to [facility], and is to call back or seek emergency care if symptoms do not improve or worsen.  Amy Mahoney, pt originally had 2 month follow up scheduled with Dr. Annamaria Boots for next week on Nov 12 at 10:15.  Because of being sick, she moved appt up for tomorrow.  She would like to know if she can cancel tomorrow's appt and keep the original appt on Nov 12 at 10:15 given rx was sent in.  Nov 12 slot is now held.  Please advise.  Thank you.

## 2013-12-23 NOTE — Telephone Encounter (Signed)
Called pt. appt scheduled. Nothing  Further needed

## 2013-12-23 NOTE — Telephone Encounter (Signed)
Yes okay to use the held spot for patient on 12-31-13. Thanks.

## 2013-12-24 ENCOUNTER — Ambulatory Visit: Payer: Commercial Managed Care - PPO | Admitting: Internal Medicine

## 2013-12-26 ENCOUNTER — Emergency Department (HOSPITAL_BASED_OUTPATIENT_CLINIC_OR_DEPARTMENT_OTHER): Payer: Commercial Managed Care - PPO

## 2013-12-26 ENCOUNTER — Emergency Department (HOSPITAL_BASED_OUTPATIENT_CLINIC_OR_DEPARTMENT_OTHER)
Admission: EM | Admit: 2013-12-26 | Discharge: 2013-12-26 | Disposition: A | Discharge status: Home / Self Care | Payer: Commercial Managed Care - PPO | Attending: Emergency Medicine | Admitting: Emergency Medicine

## 2013-12-26 ENCOUNTER — Encounter (HOSPITAL_BASED_OUTPATIENT_CLINIC_OR_DEPARTMENT_OTHER): Payer: Self-pay | Admitting: *Deleted

## 2013-12-26 DIAGNOSIS — Z792 Long term (current) use of antibiotics: Secondary | ICD-10-CM | POA: Diagnosis not present

## 2013-12-26 DIAGNOSIS — E039 Hypothyroidism, unspecified: Secondary | ICD-10-CM | POA: Insufficient documentation

## 2013-12-26 DIAGNOSIS — Z79899 Other long term (current) drug therapy: Secondary | ICD-10-CM | POA: Insufficient documentation

## 2013-12-26 DIAGNOSIS — G4730 Sleep apnea, unspecified: Secondary | ICD-10-CM | POA: Diagnosis not present

## 2013-12-26 DIAGNOSIS — R05 Cough: Secondary | ICD-10-CM | POA: Diagnosis not present

## 2013-12-26 DIAGNOSIS — Z87891 Personal history of nicotine dependence: Secondary | ICD-10-CM | POA: Insufficient documentation

## 2013-12-26 DIAGNOSIS — R053 Chronic cough: Secondary | ICD-10-CM

## 2013-12-26 DIAGNOSIS — F329 Major depressive disorder, single episode, unspecified: Secondary | ICD-10-CM | POA: Diagnosis not present

## 2013-12-26 DIAGNOSIS — R062 Wheezing: Secondary | ICD-10-CM | POA: Diagnosis not present

## 2013-12-26 DIAGNOSIS — R059 Cough, unspecified: Secondary | ICD-10-CM

## 2013-12-26 DIAGNOSIS — Z87828 Personal history of other (healed) physical injury and trauma: Secondary | ICD-10-CM | POA: Insufficient documentation

## 2013-12-26 DIAGNOSIS — Z7951 Long term (current) use of inhaled steroids: Secondary | ICD-10-CM | POA: Insufficient documentation

## 2013-12-26 LAB — CBC WITH DIFFERENTIAL/PLATELET
Basophils Absolute: 0.1 10*3/uL (ref 0.0–0.1)
Basophils Relative: 1 % (ref 0–1)
EOS ABS: 0.1 10*3/uL (ref 0.0–0.7)
EOS PCT: 1 % (ref 0–5)
HEMATOCRIT: 39.3 % (ref 36.0–46.0)
HEMOGLOBIN: 13 g/dL (ref 12.0–15.0)
LYMPHS ABS: 2.3 10*3/uL (ref 0.7–4.0)
Lymphocytes Relative: 21 % (ref 12–46)
MCH: 30.6 pg (ref 26.0–34.0)
MCHC: 33.1 g/dL (ref 30.0–36.0)
MCV: 92.5 fL (ref 78.0–100.0)
MONOS PCT: 8 % (ref 3–12)
Monocytes Absolute: 0.9 10*3/uL (ref 0.1–1.0)
Neutro Abs: 7.5 10*3/uL (ref 1.7–7.7)
Neutrophils Relative %: 69 % (ref 43–77)
Platelets: 321 10*3/uL (ref 150–400)
RBC: 4.25 MIL/uL (ref 3.87–5.11)
RDW: 12.9 % (ref 11.5–15.5)
WBC: 10.8 10*3/uL — ABNORMAL HIGH (ref 4.0–10.5)

## 2013-12-26 MED ORDER — LEVOFLOXACIN 500 MG PO TABS: 500.0000 mg | ORAL_TABLET | Freq: Every day | ORAL | Status: DC

## 2013-12-26 NOTE — Discharge Instructions (Signed)
Please follow up with your pulmonologist in the next 2 weeks.   Stop taking Azithromycin. Start taking Levaquin 500 mg once daily for the next 14 days.

## 2013-12-26 NOTE — ED Notes (Signed)
Patient c/o productive cough/fever/green sputum, placed on zpack Wednesday,& given an antibiotic shot, but no relief, able to eat & drink, but decreased appetite

## 2013-12-26 NOTE — ED Provider Notes (Signed)
CSN: [id number]     Arrival date & time 12/26/13  1253 History   First MD Initiated Contact with Patient 12/26/13 1516     Chief Complaint  Patient presents with  . URI     HPI Amy Mahoney is a 63yo woman w/ PMHx of hypothyroidism and sleep apnea who presents to the ED with fever and worsening cough. She reports she spiked a 101.2 temperature this morning. She  states she has had the cough for 9 months which is productive of green, yellow sputum. She reports she has tried several trials of antibiotics with no relief. Per notes, she has also been tried on multiple prednisone tapers with no relief. She is currently taking Azithromycin. She denies currently smoking, but has smoked the in the past. She quit smoking approximately 1 year ago. She reports mild wheezing and sinus congestion, but denies chest pain, abdominal pain, nausea, vomiting, joint pains.   Past Medical History  Diagnosis Date  . Depression   . Spider bite   . Sleep apnea   . Thyroid disease     hypothyroidism   Past Surgical History  Procedure Laterality Date  . Cholecystectomy    . Dilation and curettage of uterus    . Tonsillectomy    . Fibroids scrapping     Family History  Problem Relation Age of Onset  . Depression    . Hyperlipidemia    . Arthritis    . Diabetes Maternal Aunt   . Breast cancer Paternal Grandmother   . Lung cancer Maternal Aunt     Nonsmoker  . Cancer Maternal Aunt     breast  . Breast cancer Maternal Aunt   . Cancer Maternal Aunt     lung(nonsmoker)  . Colon cancer Neg Hx   . Pancreatic cancer Neg Hx   . Stomach cancer Neg Hx    History  Substance Use Topics  . Smoking status: Former Smoker -- 0.50 packs/day for 3 years    Types: Cigarettes    Quit date: 08/10/1990  . Smokeless tobacco: Never Used  . Alcohol Use: Yes     Comment: rarely   OB History    No data available     Review of Systems General: Denies night sweats, changes in weight, changes in appetite HEENT:  Denies headaches, ear pain, changes in vision, sore throat CV: Denies palpitations, orthopnea Pulm: See HPI GI: Denies diarrhea, constipation, melena, hematochezia GU: Denies dysuria, hematuria, frequency Msk: Denies muscle cramps Neuro: Denies weakness, numbness, tingling Skin: Denies rashes, bruising   Allergies  Review of patient's allergies indicates no known allergies.  Home Medications   Prior to Admission medications   Medication Sig Start Date End Date Taking? Authorizing Provider  albuterol (PROVENTIL HFA;VENTOLIN HFA) 108 (90 BASE) MCG/ACT inhaler Inhale 2 puffs into the lungs every 6 (six) hours as needed for wheezing or shortness of breath. 09/17/13   Zebulon, PA-C  ANTARA 90 MG CAPS TAKE 1 CAPSULE BY MOUTH DAILY. 06/30/13   Rosalita Chessman, DO  atorvastatin (LIPITOR) 20 MG tablet TAKE 1 TABLET EVERY DAY 11/09/13   Rosalita Chessman, DO  azithromycin (ZITHROMAX Z-PAK) 250 MG tablet Take as directed 12/23/13   Deneise Lever, MD  beclomethasone (QVAR) 40 MCG/ACT inhaler Inhale 2 puffs into the lungs 2 (two) times daily. 06/30/13   Rosalita Chessman, DO  benzonatate (TESSALON) 200 MG capsule Take 1 capsule (200 mg total) by mouth 3 (three) times daily as needed for  cough. 09/29/13   Deneise Lever, MD  Calcium Carbonate-Vitamin D (CALCIUM 500 + D PO) Take 1 tablet by mouth 2 (two) times daily.      Historical Provider, MD  Fluticasone Furoate-Vilanterol (BREO ELLIPTA) 200-25 MCG/INH AEPB Inhale 1 puff into the lungs daily. 11/09/13   Deneise Lever, MD  levothyroxine (SYNTHROID, LEVOTHROID) 88 MCG tablet Take 1 tablet (88 mcg total) by mouth daily before breakfast. 12/07/13   Rosalita Chessman, DO  Multiple Vitamin (MULTIVITAMIN) tablet Take 1 tablet by mouth daily.      Historical Provider, MD  NONFORMULARY OR COMPOUNDED ITEM Lab please draw a 1) Lipid panel 2) Hepatic Panel 3) BMP. Dx code: 272.4 11/17/13   Rosalita Chessman, DO  venlafaxine XR (EFFEXOR-XR) 150 MG 24 hr capsule TAKE  ONE CAPSULE EVERY DAY 11/09/13   Alferd Apa Lowne, DO   BP 165/80 mmHg  Pulse 88  Temp(Src) 100.1 F (37.8 C) (Oral)  Resp 22  Ht 5\' 6"  (1.676 m)  Wt 260 lb (117.935 kg)  BMI 41.99 kg/m2  SpO2 97% Physical Exam General: alert, sitting up in bed, NAD HEENT: [place]/AT, EOMI, PERRL, sclera anicteric, mucus membranes moist CV: RRR, normal S1/S2, no m/g/r Pulm: CTA bilaterally, coughing on exam Abd: BS+, soft, non-tender Ext: warm, trace-1+ pitting edema in lower extremities bilaterally, moves all Neuro: alert and oriented x 3, CNs II-XII intact, strength 5/5 in upper and lower extremities bilaterally  ED Course  Procedures (including critical care time) Labs Review Labs Reviewed  CBC WITH DIFFERENTIAL - Abnormal; Notable for the following:    WBC 10.8 (*)    All other components within normal limits    Imaging Review Dg Chest 2 View  12/26/2013   CLINICAL DATA:  63 year old female with intermittent productive cough and shortness of breath over the past 9 months.  EXAM: CHEST  2 VIEW  COMPARISON:  Prior chest x-ray 09/18/2013  FINDINGS: Cardiac and mediastinal contours remain within normal limits. Linear atelectasis versus scarring in the left lower lobe. No focal airspace consolidation. Mild central bronchitic changes similar compared to prior. Thoracic aorta is tortuous. No edema, pleural effusion or pneumothorax. No acute osseous abnormality.  IMPRESSION: Linear left lower lobe opacity favored to reflect atelectasis.  Otherwise, stable chest x-ray without evidence of acute cardiopulmonary process.   Electronically Signed   By: Jacqulynn Cadet M.D.   On: 12/26/2013 15:54     EKG Interpretation None      MDM   Final diagnoses:  Cough  Persistent cough for 3 weeks or longer    Patient presenting with fever and persistent productive cough for several months. She has failed multiple trials of antibiotics, including Levaquin, Augmentin, and Azithromycin, and 3 prednisone tapers. Per  records, her pulmonologist Dr. Annamaria Boots has diagnosed her cough as persistent bronchitis/bronchiolitis.   Pulmonology was consulted and recommend CT Chest for further evaluation given patient's smoking history and persistent cough. Also recommend Levaquin for 14 day course.   CT Chest showed peribronchial interstitial thickening in both lower lobes, left greater than right, and minimally in the left upper lobe lingula. There is associated patchy airspace opacity in the lower lobe, left greater than right. Findings consistent with bibasal bronchitis or possible pneumonia. Will discharge patient with Levaquin 500 mg daily for 14 days. Patient recommended to follow up with pulmonologist in next 2 weeks.   Albin Felling, MD 12/26/13 1816  Dot Lanes, MD 12/27/13 [phone]

## 2013-12-31 ENCOUNTER — Encounter: Payer: Self-pay | Admitting: Internal Medicine

## 2013-12-31 ENCOUNTER — Ambulatory Visit (INDEPENDENT_AMBULATORY_CARE_PROVIDER_SITE_OTHER): Payer: Commercial Managed Care - PPO | Admitting: Internal Medicine

## 2013-12-31 ENCOUNTER — Ambulatory Visit: Payer: Commercial Managed Care - PPO | Admitting: Internal Medicine

## 2013-12-31 VITALS — BP 122/86 | HR 79 | Ht 66.0 in | Wt 267.8 lb

## 2013-12-31 DIAGNOSIS — J180 Bronchopneumonia, unspecified organism: Secondary | ICD-10-CM

## 2013-12-31 DIAGNOSIS — G4733 Obstructive sleep apnea (adult) (pediatric): Secondary | ICD-10-CM

## 2013-12-31 MED ORDER — ALBUTEROL SULFATE HFA 108 (90 BASE) MCG/ACT IN AERS
2.0000 | INHALATION_SPRAY | Freq: Four times a day (QID) | RESPIRATORY_TRACT | Status: DC | PRN
Start: 2013-12-31 — End: 2014-05-04

## 2013-12-31 MED ORDER — FLUTICASONE FUROATE-VILANTEROL 200-25 MCG/INH IN AEPB: 1.0000 | INHALATION_SPRAY | Freq: Every day | RESPIRATORY_TRACT | Status: DC

## 2013-12-31 NOTE — Patient Instructions (Signed)
Order- D[facility]  Change CPAP pressure to 9 cwp  Dx OSA              Download for pressure compliance in 1-2 weeks   Ok to finish the Levaquin antibiotic as planned  You can resume the [place], and use the rescue inhaler if needed  Please call if we can help

## 2013-12-31 NOTE — Progress Notes (Signed)
09/29/13- 67 yoF Former smoker followed for OSA complicated by  obesity, hypothyroid,  Pt last seen on 08/10/2010. Pt would like to re-establish care for new problem of cough. CPAP therapy continues to do well With CPAP 11/ advanced, used all night every night. New problem Cough-denies prior lung disease. Onset of cough in January without recognized acute event or infection. She had thought she had a bronchitis at first but she did not respond to 3 prednisone tapers, Qvar, several rounds of antibiotics. She has had only clear mucus or dry cough with no fever significant postnasal drip or constitutional symptoms. Not acutely short of breath. No wheezing. Cough has been worse around bedtime and early in the morning. No recognized effect of location including her home or work place, indoors or out. Symptoms persisted from January through the spring and summer without much change and without a lot of difference remembered while she was on prednisone. She has some history of reflux , sleeps on a Sleep Number  bed with head slightly elevated, avoids eating after 6 PM and is taking Prilosec once daily. She does not choke or strangle easily while eating and has not been waking in the middle of the night with coughing or recognized reflux.  10/30/13- 69 yoF Former smoker followed for OSA, Cough, complicated by  obesity, hypothyroid, GERD CPAP 11/ [facility] FOLLOWS FOR: pt states she is still coughing, prod cough with clear to sometimes green mucus.  States her cough has improved some since stopping the prilosec BID.  Cough worse in morning.  Using Qvar 40, benzonatate. No significant reflux or postnasal drip noted. Cough prod white, no wheeze.  CXR 09/18/13 FINDINGS:  There is no edema or consolidation. The heart size and pulmonary  vascularity are normal. No adenopathy. No bone lesions.  IMPRESSION:  No edema or consolidation.  Electronically Signed  By: Lowella Grip M.D.  On: 09/18/2013  14:55  12/31/13- 19 yoF Former smoker followed for OSA, Cough, complicated by  obesity, hypothyroid, GERD CPAP 7/ [facility] FOLLOW UP: cough, pt was in the ER on 12/26/2013 for bronchopneumonia; CT/CXR done at Hospital.  coughing still but not as bad.  Apnea: received card for download showing good compliance but inadequate control onset pressure of 7. Our records had indicated pressure of 11 which apparently is incorrect. On Levaquin 14 days for bronchopneumonia after ER visit. Feeling much better. Has had flu vaccine . We discussed resumption Breo  CT chest 12/26/13 IMPRESSION: 1. Peribronchial interstitial thickening in both lower lobes, left greater than right, and minimally in the left upper lobe lingula. There is associated patchy airspace opacity in the lower lobe,, left greater than right, which may be atelectasis, infiltrate or a combination. Findings are consistent with bibasal bronchitis, possibly bronchopneumonia. iElectronically Signed  By: Lajean Manes M.D.  On: 12/26/2013 17:30  ROS-see HPI Constitutional:   No-   weight loss, night sweats, fevers, chills, fatigue, lassitude. HEENT:   No-  headaches, difficulty swallowing, tooth/dental problems, sore throat,       No-  sneezing, itching, ear ache, nasal congestion, post nasal drip,  CV:  No-   chest pain, orthopnea, PND, swelling in lower extremities, anasarca,                                                     dizziness, palpitations Resp: No-  shortness of breath with exertion or at rest.             +  productive cough,  + non-productive cough,  No- coughing up of blood.              No-   change in color of mucus.  No- wheezing.   Skin: No-   rash or lesions. GI:  No-   heartburn, indigestion, abdominal pain, nausea, vomiting, GU: . MS:  No-   joint pain or swelling.   Neuro-     nothing unusual Psych:  No- change in mood or affect. No depression or anxiety.  No memory loss.  OBJ- Physical Exam General- Alert,  Oriented, Affect-appropriate, Distress- none acute, obese Skin- rash-none, lesions- none, excoriation- none Lymphadenopathy- none Head- atraumatic            Eyes- Gross vision intact, PERRLA, conjunctivae and secretions clear            Ears- Hearing, canals-normal            Nose- Clear, no-Septal dev, mucus, polyps, erosion, perforation             Throat- Mallampati III-IV , mucosa clear , drainage- none, tonsils- atrophic Neck- flexible , trachea midline, no stridor , thyroid nl, carotid no bruit Chest - symmetrical excursion , unlabored           Heart/CV- RRR , no murmur , no gallop  , no rub, nl s1 s2                           - JVD- none , edema- none, stasis changes- none, varices- none           Lung- + mild bilateral rhonchi upper zones, wheeze- none, cough+harsh ,                     dullness-none, rub- none           Chest wall-  Abd-  Br/ Gen/ Rectal- Not done, not indicated Extrem- cyanosis- none, clubbing, none, atrophy- none, strength- nl Neuro- grossly intact to observation   .

## 2014-01-07 ENCOUNTER — Encounter: Payer: Self-pay | Admitting: Family Medicine

## 2014-01-07 ENCOUNTER — Ambulatory Visit (INDEPENDENT_AMBULATORY_CARE_PROVIDER_SITE_OTHER): Payer: Commercial Managed Care - PPO | Admitting: Family Medicine

## 2014-01-07 VITALS — BP 118/78 | HR 72 | Temp 98.2°F | Wt 267.4 lb

## 2014-01-07 DIAGNOSIS — E039 Hypothyroidism, unspecified: Secondary | ICD-10-CM

## 2014-01-07 DIAGNOSIS — J189 Pneumonia, unspecified organism: Secondary | ICD-10-CM

## 2014-01-07 DIAGNOSIS — E785 Hyperlipidemia, unspecified: Secondary | ICD-10-CM

## 2014-01-07 DIAGNOSIS — R739 Hyperglycemia, unspecified: Secondary | ICD-10-CM

## 2014-01-07 LAB — T3, FREE: T3 FREE: 2.6 pg/mL (ref 2.3–4.2)

## 2014-01-07 LAB — LIPID PANEL
Cholesterol: 130 mg/dL (ref 0–200)
HDL: 40.3 mg/dL (ref >39.00)
LDL Cholesterol: 73 mg/dL (ref 0–99)
NONHDL: 89.7
Total CHOL/HDL Ratio: 3
Triglycerides: 83 mg/dL (ref 0.0–149.0)
VLDL: 16.6 mg/dL (ref 0.0–40.0)

## 2014-01-07 LAB — HEPATIC FUNCTION PANEL
ALT: 17 U/L (ref 0–35)
AST: 27 U/L (ref 0–37)
Albumin: 3.9 g/dL (ref 3.5–5.2)
Alkaline Phosphatase: 54 U/L (ref 39–117)
Bilirubin, Direct: 0 mg/dL (ref 0.0–0.3)
Total Bilirubin: 0.5 mg/dL (ref 0.2–1.2)
Total Protein: 6.8 g/dL (ref 6.0–8.3)

## 2014-01-07 LAB — TSH: TSH: 2.88 u[IU]/mL (ref 0.35–4.50)

## 2014-01-07 LAB — BASIC METABOLIC PANEL WITH GFR
BUN: 12 mg/dL (ref 6–23)
CO2: 25 meq/L (ref 19–32)
Calcium: 8.9 mg/dL (ref 8.4–10.5)
Chloride: 106 meq/L (ref 96–112)
Creatinine, Ser: 0.9 mg/dL (ref 0.4–1.2)
GFR: 66.95 mL/min
Glucose, Bld: 113 mg/dL — ABNORMAL HIGH (ref 70–99)
Potassium: 4.2 meq/L (ref 3.5–5.1)
Sodium: 139 meq/L (ref 135–145)

## 2014-01-07 LAB — HEMOGLOBIN A1C: Hgb A1c MFr Bld: 6.2 % (ref 4.6–6.5)

## 2014-01-07 LAB — T4, FREE: FREE T4: 1.08 ng/dL (ref 0.60–1.60)

## 2014-01-07 NOTE — Patient Instructions (Signed)

## 2014-01-07 NOTE — Progress Notes (Signed)
   Subjective:    Patient ID: Amy Mahoney, female    DOB: 05-03-1950, 63 y.o.   MRN: [id number]  HPI Pt here f/u pneumonia and for labs and refills.  Pt is feeling much better since being on levaquin.  No other complaints.   ER and previous ov reviewed.     Review of Systems As above    Objective:   Physical Exam BP 118/78 mmHg  Pulse 72  Temp(Src) 98.2 F (36.8 C) (Oral)  Wt 267 lb 6.4 oz (121.292 kg)  SpO2 97% General appearance: alert, cooperative, appears stated age and no distress Ears: normal TM's and external ear canals both ears Nose: Nares normal. Septum midline. Mucosa normal. No drainage or sinus tenderness. Throat: lips, mucosa, and tongue normal; teeth and gums normal Neck: no adenopathy, supple, symmetrical, trachea midline and thyroid not enlarged, symmetric, no tenderness/mass/nodules Lungs: rales bibasilar Heart: S1, S2 normal Extremities: extremities normal, atraumatic, no cyanosis or edema       Assessment & Plan:  1. Hyperlipemia Check labs - Hepatic function panel - Lipid panel  2. Hyperglycemia Check labs - Basic metabolic panel - Hemoglobin A1c - Hepatic function panel  3. Hypothyroidism, unspecified hypothyroidism type con't meds, check labs - TSH - T3, free - T4, free  4. CAP (community acquired pneumonia) Repeat cxr 2 weeks-- finish abx - DG Chest 2 View; Future

## 2014-01-07 NOTE — Progress Notes (Signed)
Pre visit review using our clinic review tool, if applicable. No additional management support is needed unless otherwise documented below in the visit note. 

## 2014-01-18 ENCOUNTER — Encounter: Payer: Self-pay | Admitting: Family Medicine

## 2014-01-18 ENCOUNTER — Other Ambulatory Visit: Payer: Self-pay | Admitting: Family Medicine

## 2014-01-18 DIAGNOSIS — J189 Pneumonia, unspecified organism: Secondary | ICD-10-CM

## 2014-01-21 ENCOUNTER — Ambulatory Visit (HOSPITAL_BASED_OUTPATIENT_CLINIC_OR_DEPARTMENT_OTHER)
Admission: RE | Admit: 2014-01-21 | Discharge: 2014-01-21 | Disposition: A | Discharge status: Home / Self Care | Payer: Commercial Managed Care - PPO | Source: Ambulatory Visit | Attending: Family Medicine | Admitting: Family Medicine

## 2014-01-21 ENCOUNTER — Encounter: Payer: Self-pay | Admitting: Family Medicine

## 2014-01-21 DIAGNOSIS — J189 Pneumonia, unspecified organism: Secondary | ICD-10-CM | POA: Diagnosis present

## 2014-01-21 DIAGNOSIS — R05 Cough: Secondary | ICD-10-CM | POA: Insufficient documentation

## 2014-01-21 DIAGNOSIS — R918 Other nonspecific abnormal finding of lung field: Secondary | ICD-10-CM | POA: Diagnosis not present

## 2014-01-28 ENCOUNTER — Other Ambulatory Visit: Payer: Self-pay | Admitting: Family Medicine

## 2014-02-04 ENCOUNTER — Encounter: Payer: Self-pay | Admitting: Family Medicine

## 2014-02-04 ENCOUNTER — Ambulatory Visit (INDEPENDENT_AMBULATORY_CARE_PROVIDER_SITE_OTHER): Payer: Commercial Managed Care - PPO | Admitting: Family Medicine

## 2014-02-04 VITALS — BP 120/76 | HR 89 | Temp 98.6°F | Wt 275.0 lb

## 2014-02-04 DIAGNOSIS — R05 Cough: Secondary | ICD-10-CM

## 2014-02-04 DIAGNOSIS — R059 Cough, unspecified: Secondary | ICD-10-CM

## 2014-02-04 DIAGNOSIS — R609 Edema, unspecified: Secondary | ICD-10-CM

## 2014-02-04 MED ORDER — HYDROCHLOROTHIAZIDE 25 MG PO TABS: 25.0000 mg | ORAL_TABLET | Freq: Every day | ORAL | Status: DC

## 2014-02-04 NOTE — Progress Notes (Signed)
Pre visit review using our clinic review tool, if applicable. No additional management support is needed unless otherwise documented below in the visit note. 

## 2014-02-04 NOTE — Progress Notes (Signed)
   Subjective:    Patient ID: STEPHONIE WILCOXEN, female    DOB: [birth year], 63 y.o.   MRN: [id number]  HPI Pt here c/o cough with clear/ white sputum.   Review of Systems     Objective:   Physical Exam BP 120/76 mmHg  Pulse 89  Temp(Src) 98.6 F (37 C) (Oral)  Wt 275 lb (124.739 kg)  SpO2 97% General appearance: alert, cooperative, appears stated age and no distress Ears: normal TM's and external ear canals both ears Nose: Nares normal. Septum midline. Mucosa normal. No drainage or sinus tenderness. Throat: lips, mucosa, and tongue normal; teeth and gums normal Neck: no adenopathy, no carotid bruit, no JVD, supple, symmetrical, trachea midline and thyroid not enlarged, symmetric, no tenderness/mass/nodules Lungs: clear to auscultation bilaterally Heart: regular rate and rhythm, S1, S2 normal, no murmur, click, rub or gallop       Assessment & Plan:  1. Edema Elevated legs, dec salt Eat K rich foods daily - hydrochlorothiazide (HYDRODIURIL) 25 MG tablet; Take 1 tablet (25 mg total) by mouth daily.  Dispense: 100 tablet; Refill: 0  2. Cough Post infectious / atelectasis Blow up balloons 10 x a day mucinex or delsym

## 2014-02-04 NOTE — Patient Instructions (Signed)
Atelectasis Atelectasis is a collapse of the small air sacs in the lungs (alveoli). When this occurs, all or part of a lung collapses and becomes airless. It can be caused by various things and is a common problem after surgery. The severity of atelectasis will vary depending on the size of the area involved and the underlying cause of the condition. CAUSES  There are multiple causes for atelectasis:   Shallow breathing, particularly if there is an injury to your chest wall or abdomen that makes it painful to take a deep breath. This commonly occurs after surgery.  Obstruction of your airways (bronchi or bronchioles). This may be caused by a buildup of mucus (mucus plug), tumors, blood clots (pulmonary embolus), or inhaled foreign bodies. Mucus plugs occur when the lungs do not expand enough to get rid of mucus.  Outside pressure on the lung. This may be caused by tumors, fluid (pleural effusion), or a leakage of air between the lung and rib cage (pneumothorax).   Infections such as pneumonia.  Scarring in lung tissue left over from previous infection or injury.  Some diseases such as cystic fibrosis. SIGNS AND SYMPTOMS  Often, atelectasis will have no symptoms. When symptoms occur, they include:  Shortness of breath.   Bluish color to your nails, lips, or mouth (cyanosis). DIAGNOSIS  Your health care provider may suspect atelectasis based on symptoms and physical findings. A chest X-ray may be done to confirm the diagnosis. More specialized X-ray exams are sometimes required.  TREATMENT  Treatment will depend on the cause of the atelectasis. Treatment may include:  Purposeful coughing to loosen mucus plugs in the lungs.  Chest physiotherapy. This consists of clapping or percussion on the chest over the lungs to further loosen mucus plugs.  Postural drainage techniques. This involves positioning your body so your head is lower than your chest. [place]  relaxed deep breathing whenever you are sitting down. A good technique is to take a few relaxed deep breaths each time a commercial comes on if you are watching television.  If you were given a deep breathing device (such as an incentive spirometer) or a mucus clearance device, use this regularly as directed by your health care provider.  Try to cough several times a day as directed by your health care provider.  Perform any chest physiotherapy or postural drainage techniques as directed by your health care provider. If necessary, have someone (such as a family member) assist you with these techniques.  When lying down, lie on the unaffected side to encourage mucus drainage.  Stay physically active as much as possible. SEEK IMMEDIATE MEDICAL CARE IF:   You develop increasing problems with your breathing.   You develop severe chest pain.   You develop severe coughing, or you cough up blood.   You have a fever or persistent symptoms for more than 2-3 days.   You have a fever and your symptoms suddenly get worse.  MAKE SURE YOU:  Understand these instructions.  Will watch your condition.  Will get help right away if you are not doing well or get worse. Document Released: 02/05/2005 Document Revised: 02/10/2013 Document Reviewed: 08/13/2012 [facility] Patient Information 2015 [place], [place]. This information is not intended to replace advice given to you by your health care provider. Make sure you discuss any questions you have with your health care provider.

## 2014-03-15 ENCOUNTER — Telehealth: Payer: Self-pay | Admitting: Family Medicine

## 2014-03-15 NOTE — Telephone Encounter (Signed)
error 

## 2014-04-06 ENCOUNTER — Other Ambulatory Visit: Payer: Self-pay | Admitting: Family Medicine

## 2014-04-19 ENCOUNTER — Other Ambulatory Visit: Payer: Self-pay

## 2014-04-19 DIAGNOSIS — E785 Hyperlipidemia, unspecified: Secondary | ICD-10-CM

## 2014-04-19 MED ORDER — ANTARA 90 MG PO CAPS: ORAL_CAPSULE | ORAL | Status: DC

## 2014-05-04 ENCOUNTER — Ambulatory Visit (INDEPENDENT_AMBULATORY_CARE_PROVIDER_SITE_OTHER): Payer: Commercial Managed Care - PPO | Admitting: Family Medicine

## 2014-05-04 ENCOUNTER — Encounter: Payer: Self-pay | Admitting: Family Medicine

## 2014-05-04 VITALS — BP 122/78 | HR 73 | Temp 98.3°F | Wt 272.2 lb

## 2014-05-04 DIAGNOSIS — J40 Bronchitis, not specified as acute or chronic: Secondary | ICD-10-CM

## 2014-05-04 DIAGNOSIS — R05 Cough: Secondary | ICD-10-CM

## 2014-05-04 DIAGNOSIS — J209 Acute bronchitis, unspecified: Secondary | ICD-10-CM

## 2014-05-04 DIAGNOSIS — R059 Cough, unspecified: Secondary | ICD-10-CM

## 2014-05-04 MED ORDER — ALBUTEROL SULFATE (2.5 MG/3ML) 0.083% IN NEBU
2.5000 mg | INHALATION_SOLUTION | Freq: Once | RESPIRATORY_TRACT | Status: AC
  Administered 2014-05-04: 2.5 mg via RESPIRATORY_TRACT

## 2014-05-04 MED ORDER — LEVOFLOXACIN 500 MG PO TABS: 500.0000 mg | ORAL_TABLET | Freq: Every day | ORAL | Status: DC

## 2014-05-04 MED ORDER — METHYLPREDNISOLONE ACETATE 40 MG/ML IJ SUSP
80.0000 mg | Freq: Once | INTRAMUSCULAR | Status: AC
  Administered 2014-05-04: 80 mg via INTRAMUSCULAR

## 2014-05-04 MED ORDER — PREDNISONE 10 MG PO TABS: ORAL_TABLET | ORAL | Status: DC

## 2014-05-04 NOTE — Progress Notes (Signed)
Pre visit review using our clinic review tool, if applicable. No additional management support is needed unless otherwise documented below in the visit note. 

## 2014-05-04 NOTE — Progress Notes (Signed)
  Subjective:     Amy Mahoney is a 64 y.o. female here for evaluation of a cough. Onset of symptoms was 2 weeks ago. Symptoms have been gradually worsening since that time. The cough is productive and is aggravated by exercise, infection and reclining position. Associated symptoms include: shortness of breath, sputum production and wheezing. Patient does have a history of asthma. Patient does have a history of environmental allergens. Patient has not traveled recently. Patient does not have a history of smoking. Patient has not had a previous chest x-ray. Patient has not had a PPD done.  The following portions of the patient's history were reviewed and updated as appropriate: allergies, current medications, past family history, past medical history, past social history, past surgical history and problem list.  Review of Systems Pertinent items are noted in HPI.    Objective:    Oxygen saturation 98% on room air BP 122/78 mmHg  Pulse 73  Temp(Src) 98.3 F (36.8 C) (Oral)  Wt 272 lb 3.2 oz (123.469 kg)  SpO2 98% General appearance: alert, cooperative, appears stated age and no distress Ears: normal TM's and external ear canals both ears Nose: green discharge, moderate congestion Throat: lips, mucosa, and tongue normal; teeth and gums normal Neck: moderate anterior cervical adenopathy, supple, symmetrical, trachea midline and thyroid not enlarged, symmetric, no tenderness/mass/nodules Lungs: wheezes bilaterally Heart: S1, S2 normal Extremities: extremities normal, atraumatic, no cyanosis or edema    Assessment:    Acute Bronchitis    Plan:    Antibiotics per medication orders. Antitussives per medication orders. Avoid exposure to tobacco smoke and fumes. B-agonist inhaler. Call if shortness of breath worsens, blood in sputum, change in character of cough, development of fever or chills, inability to maintain nutrition and hydration. Avoid exposure to tobacco smoke and fumes. depo  medrol

## 2014-05-04 NOTE — Patient Instructions (Signed)

## 2014-05-07 ENCOUNTER — Ambulatory Visit: Payer: Commercial Managed Care - PPO | Admitting: Internal Medicine

## 2014-06-03 ENCOUNTER — Other Ambulatory Visit: Payer: Self-pay | Admitting: Family Medicine

## 2014-06-24 ENCOUNTER — Other Ambulatory Visit: Payer: Self-pay | Admitting: Family Medicine

## 2014-06-24 ENCOUNTER — Encounter: Payer: Self-pay | Admitting: Family Medicine

## 2014-06-24 DIAGNOSIS — E039 Hypothyroidism, unspecified: Secondary | ICD-10-CM

## 2014-06-24 DIAGNOSIS — I10 Essential (primary) hypertension: Secondary | ICD-10-CM

## 2014-06-24 DIAGNOSIS — E785 Hyperlipidemia, unspecified: Secondary | ICD-10-CM

## 2014-06-30 ENCOUNTER — Other Ambulatory Visit: Payer: Self-pay | Admitting: Internal Medicine

## 2014-06-30 DIAGNOSIS — G4733 Obstructive sleep apnea (adult) (pediatric): Secondary | ICD-10-CM

## 2014-07-01 ENCOUNTER — Ambulatory Visit (INDEPENDENT_AMBULATORY_CARE_PROVIDER_SITE_OTHER): Payer: Commercial Managed Care - PPO | Admitting: Internal Medicine

## 2014-07-01 ENCOUNTER — Encounter: Payer: Self-pay | Admitting: Internal Medicine

## 2014-07-01 VITALS — BP 132/88 | HR 72 | Ht 66.0 in | Wt 274.0 lb

## 2014-07-01 DIAGNOSIS — G4733 Obstructive sleep apnea (adult) (pediatric): Secondary | ICD-10-CM

## 2014-07-01 DIAGNOSIS — J180 Bronchopneumonia, unspecified organism: Secondary | ICD-10-CM

## 2014-07-01 NOTE — Patient Instructions (Signed)
We can continue CPAP 7/ [facility]  Please call if we can help you

## 2014-07-01 NOTE — Progress Notes (Signed)
09/29/13- 32 yoF Former smoker followed for OSA complicated by  obesity, hypothyroid,  Pt last seen on 08/10/2010. Pt would like to re-establish care for new problem of cough. CPAP therapy continues to do well With CPAP 11/ advanced, used all night every night. New problem Cough-denies prior lung disease. Onset of cough in January without recognized acute event or infection. She had thought she had a bronchitis at first but she did not respond to 3 prednisone tapers, Qvar, several rounds of antibiotics. She has had only clear mucus or dry cough with no fever significant postnasal drip or constitutional symptoms. Not acutely short of breath. No wheezing. Cough has been worse around bedtime and early in the morning. No recognized effect of location including her home or work place, indoors or out. Symptoms persisted from January through the spring and summer without much change and without a lot of difference remembered while she was on prednisone. She has some history of reflux , sleeps on a Sleep Number  bed with head slightly elevated, avoids eating after 6 PM and is taking Prilosec once daily. She does not choke or strangle easily while eating and has not been waking in the middle of the night with coughing or recognized reflux.  10/30/13- 3 yoF Former smoker followed for OSA, Cough, complicated by  obesity, hypothyroid, GERD CPAP 11/ Advanced FOLLOWS FOR: pt states she is still coughing, prod cough with clear to sometimes green mucus.  States her cough has improved some since stopping the prilosec BID.  Cough worse in morning.  Using Qvar 40, benzonatate. No significant reflux or postnasal drip noted. Cough prod white, no wheeze.  CXR 09/18/13 FINDINGS:  There is no edema or consolidation. The heart size and pulmonary  vascularity are normal. No adenopathy. No bone lesions.  IMPRESSION:  No edema or consolidation.  Electronically Signed  By: Lowella Grip M.D.  On: 09/18/2013  14:55  12/31/13- 70 yoF Former smoker followed for OSA, Cough, complicated by  obesity, hypothyroid, GERD CPAP 7/ Advanced FOLLOW UP: cough, pt was in the ER on 12/26/2013 for bronchopneumonia; CT/CXR done at Hospital.  coughing still but not as bad.  Apnea: received card for download showing good compliance but inadequate control onset pressure of 7. Our records had indicated pressure of 11 which apparently is incorrect. On Levaquin 14 days for bronchopneumonia after ER visit. Feeling much better. Has had flu vaccine . We discussed resumption Breo  CT chest 12/26/13 IMPRESSION: 1. Peribronchial interstitial thickening in both lower lobes, left greater than right, and minimally in the left upper lobe lingula. There is associated patchy airspace opacity in the lower lobe,, left greater than right, which may be atelectasis, infiltrate or a combination. Findings are consistent with bibasal bronchitis, possibly bronchopneumonia. iElectronically Signed  By: Lajean Manes M.D.  On: 12/26/2013 17:30  07/01/14- 72 yoF Former smoker followed for OSA, Cough, complicated by  obesity, hypothyroid, GERD CPAP 7/ Advanced Follows For: Pt states breathing has improved. Uses CPAP 7-8 hours nightly , mask fits fine. No new complaints.  "Life much better" on CPAP. Bronchopneumonia resolved on f/u CXR. CXR 01/21/14- image reviewed IMPRESSION: 1. Significant improvement in the appearance the chest with complete resolution of airspace disease in the lung bases bilaterally, with minimal residual atelectasis or scarring in the left lower lobe. Electronically Signed  By: Vinnie Langton M.D.  On: 01/21/2014 14:21  ROS-see HPI Constitutional:   No-   weight loss, night sweats, fevers, chills, fatigue, lassitude. HEENT:   No-  headaches, difficulty swallowing, tooth/dental problems, sore throat,       No-  sneezing, itching, ear ache, nasal congestion, post nasal drip,  CV:  No-   chest pain, orthopnea,  PND, swelling in lower extremities, anasarca,  dizziness, palpitations Resp: No-   shortness of breath with exertion or at rest.               productive cough,   non-productive cough,  No- coughing up of blood.              No-   change in color of mucus.  No- wheezing.   Skin: No-   rash or lesions. GI:  No-   heartburn, indigestion, abdominal pain, nausea, vomiting, GU: . MS:  No-   joint pain or swelling.   Neuro-     nothing unusual Psych:  No- change in mood or affect. No depression or anxiety.  No memory loss.  OBJ- Physical Exam General- Alert, Oriented, Affect-appropriate, Distress- none acute, obese Skin- rash-none, lesions- none, excoriation- none Lymphadenopathy- none Head- atraumatic            Eyes- Gross vision intact, PERRLA, conjunctivae and secretions clear            Ears- Hearing, canals-normal            Nose- Clear, no-Septal dev, mucus, polyps, erosion, perforation             Throat- Mallampati III-IV , mucosa clear , drainage- none, tonsils- atrophic Neck- flexible , trachea midline, no stridor , thyroid nl, carotid no bruit Chest - symmetrical excursion , unlabored           Heart/CV- RRR , no murmur , no gallop  , no rub, nl s1 s2                           - JVD- none , edema- none, stasis changes- none, varices- none           Lung- + mild bilateral rhonchi upper zones, wheeze- none, cough+harsh , dullness-none, rub- none           Chest wall-  Abd-  Br/ Gen/ Rectal- Not done, not indicated Extrem- cyanosis- none, clubbing, none, atrophy- none, strength- nl Neuro- grossly intact to observation   .

## 2014-07-20 NOTE — Assessment & Plan Note (Signed)
Good compliance and control on CPAP 7/ Advanced Plan- discussed goals. No change

## 2014-07-20 NOTE — Assessment & Plan Note (Signed)
This problem resolved with therapy

## 2014-08-06 ENCOUNTER — Telehealth: Payer: Self-pay | Admitting: *Deleted

## 2014-08-06 ENCOUNTER — Encounter: Payer: Self-pay | Admitting: *Deleted

## 2014-08-06 NOTE — Telephone Encounter (Signed)
Pre-Visit Call completed with patient and chart updated.   Pre-Visit Info documented in Specialty Comments under [employer].    

## 2014-08-09 ENCOUNTER — Encounter: Payer: Self-pay | Admitting: Family Medicine

## 2014-08-09 ENCOUNTER — Other Ambulatory Visit (HOSPITAL_COMMUNITY)
Admission: RE | Admit: 2014-08-09 | Discharge: 2014-08-09 | Disposition: A | Discharge status: Home / Self Care | Payer: Commercial Managed Care - PPO | Source: Ambulatory Visit | Attending: Family Medicine | Admitting: Family Medicine

## 2014-08-09 ENCOUNTER — Ambulatory Visit (INDEPENDENT_AMBULATORY_CARE_PROVIDER_SITE_OTHER): Payer: Commercial Managed Care - PPO | Admitting: Family Medicine

## 2014-08-09 VITALS — BP 116/72 | HR 70 | Temp 98.3°F | Ht 66.0 in | Wt 277.4 lb

## 2014-08-09 DIAGNOSIS — E2839 Other primary ovarian failure: Secondary | ICD-10-CM | POA: Diagnosis not present

## 2014-08-09 DIAGNOSIS — E785 Hyperlipidemia, unspecified: Secondary | ICD-10-CM

## 2014-08-09 DIAGNOSIS — Z124 Encounter for screening for malignant neoplasm of cervix: Secondary | ICD-10-CM | POA: Diagnosis not present

## 2014-08-09 DIAGNOSIS — A499 Bacterial infection, unspecified: Secondary | ICD-10-CM

## 2014-08-09 DIAGNOSIS — R82998 Other abnormal findings in urine: Secondary | ICD-10-CM

## 2014-08-09 DIAGNOSIS — Z01419 Encounter for gynecological examination (general) (routine) without abnormal findings: Secondary | ICD-10-CM | POA: Diagnosis not present

## 2014-08-09 DIAGNOSIS — N760 Acute vaginitis: Secondary | ICD-10-CM | POA: Diagnosis present

## 2014-08-09 DIAGNOSIS — Z0000 Encounter for general adult medical examination without abnormal findings: Secondary | ICD-10-CM

## 2014-08-09 DIAGNOSIS — Z1151 Encounter for screening for human papillomavirus (HPV): Secondary | ICD-10-CM | POA: Insufficient documentation

## 2014-08-09 DIAGNOSIS — B9689 Other specified bacterial agents as the cause of diseases classified elsewhere: Secondary | ICD-10-CM

## 2014-08-09 DIAGNOSIS — Z1231 Encounter for screening mammogram for malignant neoplasm of breast: Secondary | ICD-10-CM

## 2014-08-09 DIAGNOSIS — N390 Urinary tract infection, site not specified: Secondary | ICD-10-CM

## 2014-08-09 DIAGNOSIS — Z113 Encounter for screening for infections with a predominantly sexual mode of transmission: Secondary | ICD-10-CM | POA: Diagnosis present

## 2014-08-09 LAB — POCT URINALYSIS DIPSTICK
Bilirubin, UA: NEGATIVE
Glucose, UA: NEGATIVE
Ketones, UA: NEGATIVE
Nitrite, UA: NEGATIVE
Protein, UA: NEGATIVE
RBC UA: NEGATIVE
SPEC GRAV UA: 1.025
UROBILINOGEN UA: 4
pH, UA: 6.5

## 2014-08-09 MED ORDER — METRONIDAZOLE 500 MG PO TABS: 500.0000 mg | ORAL_TABLET | Freq: Two times a day (BID) | ORAL | Status: DC

## 2014-08-09 NOTE — Patient Instructions (Signed)
Preventive Care for Adults A healthy lifestyle and preventive care can promote health and wellness. Preventive health guidelines for women include the following key practices.  A routine yearly physical is a good way to check with your health care provider about your health and preventive screening. It is a chance to share any concerns and updates on your health and to receive a thorough exam.  Visit your dentist for a routine exam and preventive care every 6 months. Brush your teeth twice a day and floss once a day. Good oral hygiene prevents tooth decay and gum disease.  The frequency of eye exams is based on your age, health, family medical history, use of contact lenses, and other factors. Follow your health care provider's recommendations for frequency of eye exams.  Eat a healthy diet. Foods like vegetables, fruits, whole grains, low-fat dairy products, and lean protein foods contain the nutrients you need without too many calories. Decrease your intake of foods high in solid fats, added sugars, and salt. Eat the right amount of calories for you.Get information about a proper diet from your health care provider, if necessary.  Regular physical exercise is one of the most important things you can do for your health. Most adults should get at least 150 minutes of moderate-intensity exercise (any activity that increases your heart rate and causes you to sweat) each week. In addition, most adults need muscle-strengthening exercises on 2 or more days a week.  Maintain a healthy weight. The body mass index (BMI) is a screening tool to identify possible weight problems. It provides an estimate of body fat based on height and weight. Your health care provider can find your BMI and can help you achieve or maintain a healthy weight.For adults 20 years and older:  A BMI below 18.5 is considered underweight.  A BMI of 18.5 to 24.9 is normal.  A BMI of 25 to 29.9 is considered overweight.  A BMI of  30 and above is considered obese.  Maintain normal blood lipids and cholesterol levels by exercising and minimizing your intake of saturated fat. Eat a balanced diet with plenty of fruit and vegetables. Blood tests for lipids and cholesterol should begin at age 76 and be repeated every 5 years. If your lipid or cholesterol levels are high, you are over 50, or you are at high risk for heart disease, you may need your cholesterol levels checked more frequently.Ongoing high lipid and cholesterol levels should be treated with medicines if diet and exercise are not working.  If you smoke, find out from your health care provider how to quit. If you do not use tobacco, do not start.  Lung cancer screening is recommended for adults aged 22-80 years who are at high risk for developing lung cancer because of a history of smoking. A yearly low-dose CT scan of the lungs is recommended for people who have at least a 30-pack-year history of smoking and are a current smoker or have quit within the past 15 years. A pack year of smoking is smoking an average of 1 pack of cigarettes a day for 1 year (for example: 1 pack a day for 30 years or 2 packs a day for 15 years). Yearly screening should continue until the smoker has stopped smoking for at least 15 years. Yearly screening should be stopped for people who develop a health problem that would prevent them from having lung cancer treatment.  If you are pregnant, do not drink alcohol. If you are breastfeeding,  be very cautious about drinking alcohol. If you are not pregnant and choose to drink alcohol, do not have more than 1 drink per day. One drink is considered to be 12 ounces (355 mL) of beer, 5 ounces (148 mL) of wine, or 1.5 ounces (44 mL) of liquor.  Avoid use of street drugs. Do not share needles with anyone. Ask for help if you need support or instructions about stopping the use of drugs.  High blood pressure causes heart disease and increases the risk of  stroke. Your blood pressure should be checked at least every 1 to 2 years. Ongoing high blood pressure should be treated with medicines if weight loss and exercise do not work.  If you are 75-52 years old, ask your health care provider if you should take aspirin to prevent strokes.  Diabetes screening involves taking a blood sample to check your fasting blood sugar level. This should be done once every 3 years, after age 15, if you are within normal weight and without risk factors for diabetes. Testing should be considered at a younger age or be carried out more frequently if you are overweight and have at least 1 risk factor for diabetes.  Breast cancer screening is essential preventive care for women. You should practice "breast self-awareness." This means understanding the normal appearance and feel of your breasts and may include breast self-examination. Any changes detected, no matter how small, should be reported to a health care provider. Women in their 58s and 30s should have a clinical breast exam (CBE) by a health care provider as part of a regular health exam every 1 to 3 years. After age 16, women should have a CBE every year. Starting at age 53, women should consider having a mammogram (breast X-ray test) every year. Women who have a family history of breast cancer should talk to their health care provider about genetic screening. Women at a high risk of breast cancer should talk to their health care providers about having an MRI and a mammogram every year.  Breast cancer gene (BRCA)-related cancer risk assessment is recommended for women who have family members with BRCA-related cancers. BRCA-related cancers include breast, ovarian, tubal, and peritoneal cancers. Having family members with these cancers may be associated with an increased risk for harmful changes (mutations) in the breast cancer genes BRCA1 and BRCA2. Results of the assessment will determine the need for genetic counseling and  BRCA1 and BRCA2 testing.  Routine pelvic exams to screen for cancer are no longer recommended for nonpregnant women who are considered low risk for cancer of the pelvic organs (ovaries, uterus, and vagina) and who do not have symptoms. Ask your health care provider if a screening pelvic exam is right for you.  If you have had past treatment for cervical cancer or a condition that could lead to cancer, you need Pap tests and screening for cancer for at least 20 years after your treatment. If Pap tests have been discontinued, your risk factors (such as having a new sexual partner) need to be reassessed to determine if screening should be resumed. Some women have medical problems that increase the chance of getting cervical cancer. In these cases, your health care provider may recommend more frequent screening and Pap tests.  The HPV test is an additional test that may be used for cervical cancer screening. The HPV test looks for the virus that can cause the cell changes on the cervix. The cells collected during the Pap test can be  tested for HPV. The HPV test could be used to screen women aged 30 years and older, and should be used in women of any age who have unclear Pap test results. After the age of 30, women should have HPV testing at the same frequency as a Pap test.  Colorectal cancer can be detected and often prevented. Most routine colorectal cancer screening begins at the age of 50 years and continues through age 75 years. However, your health care provider may recommend screening at an earlier age if you have risk factors for colon cancer. On a yearly basis, your health care provider may provide home test kits to check for hidden blood in the stool. Use of a small camera at the end of a tube, to directly examine the colon (sigmoidoscopy or colonoscopy), can detect the earliest forms of colorectal cancer. Talk to your health care provider about this at age 50, when routine screening begins. Direct  exam of the colon should be repeated every 5-10 years through age 75 years, unless early forms of pre-cancerous polyps or small growths are found.  People who are at an increased risk for hepatitis B should be screened for this virus. You are considered at high risk for hepatitis B if:  You were born in a country where hepatitis B occurs often. Talk with your health care provider about which countries are considered high risk.  Your parents were born in a high-risk country and you have not received a shot to protect against hepatitis B (hepatitis B vaccine).  You have HIV or AIDS.  You use needles to inject street drugs.  You live with, or have sex with, someone who has hepatitis B.  You get hemodialysis treatment.  You take certain medicines for conditions like cancer, organ transplantation, and autoimmune conditions.  Hepatitis C blood testing is recommended for all people born from 1945 through 1965 and any individual with known risks for hepatitis C.  Practice safe sex. Use condoms and avoid high-risk sexual practices to reduce the spread of sexually transmitted infections (STIs). STIs include gonorrhea, chlamydia, syphilis, trichomonas, herpes, HPV, and human immunodeficiency virus (HIV). Herpes, HIV, and HPV are viral illnesses that have no cure. They can result in disability, cancer, and death.  You should be screened for sexually transmitted illnesses (STIs) including gonorrhea and chlamydia if:  You are sexually active and are younger than 24 years.  You are older than 24 years and your health care provider tells you that you are at risk for this type of infection.  Your sexual activity has changed since you were last screened and you are at an increased risk for chlamydia or gonorrhea. Ask your health care provider if you are at risk.  If you are at risk of being infected with HIV, it is recommended that you take a prescription medicine daily to prevent HIV infection. This is  called preexposure prophylaxis (PrEP). You are considered at risk if:  You are a heterosexual woman, are sexually active, and are at increased risk for HIV infection.  You take drugs by injection.  You are sexually active with a partner who has HIV.  Talk with your health care provider about whether you are at high risk of being infected with HIV. If you choose to begin PrEP, you should first be tested for HIV. You should then be tested every 3 months for as long as you are taking PrEP.  Osteoporosis is a disease in which the bones lose minerals and strength   with aging. This can result in serious bone fractures or breaks. The risk of osteoporosis can be identified using a bone density scan. Women ages 65 years and over and women at risk for fractures or osteoporosis should discuss screening with their health care providers. Ask your health care provider whether you should take a calcium supplement or vitamin D to reduce the rate of osteoporosis.  Menopause can be associated with physical symptoms and risks. Hormone replacement therapy is available to decrease symptoms and risks. You should talk to your health care provider about whether hormone replacement therapy is right for you.  Use sunscreen. Apply sunscreen liberally and repeatedly throughout the day. You should seek shade when your shadow is shorter than you. Protect yourself by wearing long sleeves, pants, a wide-brimmed hat, and sunglasses year round, whenever you are outdoors.  Once a month, do a whole body skin exam, using a mirror to look at the skin on your back. Tell your health care provider of new moles, moles that have irregular borders, moles that are larger than a pencil eraser, or moles that have changed in shape or color.  Stay current with required vaccines (immunizations).  Influenza vaccine. All adults should be immunized every year.  Tetanus, diphtheria, and acellular pertussis (Td, Tdap) vaccine. Pregnant women should  receive 1 dose of Tdap vaccine during each pregnancy. The dose should be obtained regardless of the length of time since the last dose. Immunization is preferred during the 27th-36th week of gestation. An adult who has not previously received Tdap or who does not know her vaccine status should receive 1 dose of Tdap. This initial dose should be followed by tetanus and diphtheria toxoids (Td) booster doses every 10 years. Adults with an unknown or incomplete history of completing a 3-dose immunization series with Td-containing vaccines should begin or complete a primary immunization series including a Tdap dose. Adults should receive a Td booster every 10 years.  Varicella vaccine. An adult without evidence of immunity to varicella should receive 2 doses or a second dose if she has previously received 1 dose. Pregnant females who do not have evidence of immunity should receive the first dose after pregnancy. This first dose should be obtained before leaving the health care facility. The second dose should be obtained 4-8 weeks after the first dose.  Human papillomavirus (HPV) vaccine. Females aged 13-26 years who have not received the vaccine previously should obtain the 3-dose series. The vaccine is not recommended for use in pregnant females. However, pregnancy testing is not needed before receiving a dose. If a female is found to be pregnant after receiving a dose, no treatment is needed. In that case, the remaining doses should be delayed until after the pregnancy. Immunization is recommended for any person with an immunocompromised condition through the age of 26 years if she did not get any or all doses earlier. During the 3-dose series, the second dose should be obtained 4-8 weeks after the first dose. The third dose should be obtained 24 weeks after the first dose and 16 weeks after the second dose.  Zoster vaccine. One dose is recommended for adults aged 60 years or older unless certain conditions are  present.  Measles, mumps, and rubella (MMR) vaccine. Adults born before 1957 generally are considered immune to measles and mumps. Adults born in 1957 or later should have 1 or more doses of MMR vaccine unless there is a contraindication to the vaccine or there is laboratory evidence of immunity to   each of the three diseases. A routine second dose of MMR vaccine should be obtained at least 28 days after the first dose for students attending postsecondary schools, health care workers, or international travelers. People who received inactivated measles vaccine or an unknown type of measles vaccine during 1963-1967 should receive 2 doses of MMR vaccine. People who received inactivated mumps vaccine or an unknown type of mumps vaccine before 1979 and are at high risk for mumps infection should consider immunization with 2 doses of MMR vaccine. For females of childbearing age, rubella immunity should be determined. If there is no evidence of immunity, females who are not pregnant should be vaccinated. If there is no evidence of immunity, females who are pregnant should delay immunization until after pregnancy. Unvaccinated health care workers born before 1957 who lack laboratory evidence of measles, mumps, or rubella immunity or laboratory confirmation of disease should consider measles and mumps immunization with 2 doses of MMR vaccine or rubella immunization with 1 dose of MMR vaccine.  Pneumococcal 13-valent conjugate (PCV13) vaccine. When indicated, a person who is uncertain of her immunization history and has no record of immunization should receive the PCV13 vaccine. An adult aged 19 years or older who has certain medical conditions and has not been previously immunized should receive 1 dose of PCV13 vaccine. This PCV13 should be followed with a dose of pneumococcal polysaccharide (PPSV23) vaccine. The PPSV23 vaccine dose should be obtained at least 8 weeks after the dose of PCV13 vaccine. An adult aged 19  years or older who has certain medical conditions and previously received 1 or more doses of PPSV23 vaccine should receive 1 dose of PCV13. The PCV13 vaccine dose should be obtained 1 or more years after the last PPSV23 vaccine dose.  Pneumococcal polysaccharide (PPSV23) vaccine. When PCV13 is also indicated, PCV13 should be obtained first. All adults aged 65 years and older should be immunized. An adult younger than age 65 years who has certain medical conditions should be immunized. Any person who resides in a nursing home or long-term care facility should be immunized. An adult smoker should be immunized. People with an immunocompromised condition and certain other conditions should receive both PCV13 and PPSV23 vaccines. People with human immunodeficiency virus (HIV) infection should be immunized as soon as possible after diagnosis. Immunization during chemotherapy or radiation therapy should be avoided. Routine use of PPSV23 vaccine is not recommended for American Indians, [place] Natives, or people younger than 65 years unless there are medical conditions that require PPSV23 vaccine. When indicated, people who have unknown immunization and have no record of immunization should receive PPSV23 vaccine. One-time revaccination 5 years after the first dose of PPSV23 is recommended for people aged 19-64 years who have chronic kidney failure, nephrotic syndrome, asplenia, or immunocompromised conditions. People who received 1-2 doses of PPSV23 before age 65 years should receive another dose of PPSV23 vaccine at age 65 years or later if at least 5 years have passed since the previous dose. Doses of PPSV23 are not needed for people immunized with PPSV23 at or after age 65 years.  Meningococcal vaccine. Adults with asplenia or persistent complement component deficiencies should receive 2 doses of quadrivalent meningococcal conjugate (MenACWY-D) vaccine. The doses should be obtained at least 2 months apart.  Microbiologists working with certain meningococcal bacteria, military recruits, people at risk during an outbreak, and people who travel to or live in countries with a high rate of meningitis should be immunized. A first-year college student up through age   21 years who is living in a residence hall should receive a dose if she did not receive a dose on or after her 16th birthday. Adults who have certain high-risk conditions should receive one or more doses of vaccine.  Hepatitis A vaccine. Adults who wish to be protected from this disease, have certain high-risk conditions, work with hepatitis A-infected animals, work in hepatitis A research labs, or travel to or work in countries with a high rate of hepatitis A should be immunized. Adults who were previously unvaccinated and who anticipate close contact with an international adoptee during the first 60 days after arrival in the [place] from a country with a high rate of hepatitis A should be immunized.  Hepatitis B vaccine. Adults who wish to be protected from this disease, have certain high-risk conditions, may be exposed to blood or other infectious body fluids, are household contacts or sex partners of hepatitis B positive people, are clients or workers in certain care facilities, or travel to or work in countries with a high rate of hepatitis B should be immunized.  Haemophilus influenzae type b (Hib) vaccine. A previously unvaccinated person with asplenia or sickle cell disease or having a scheduled splenectomy should receive 1 dose of Hib vaccine. Regardless of previous immunization, a recipient of a hematopoietic stem cell transplant should receive a 3-dose series 6-12 months after her successful transplant. Hib vaccine is not recommended for adults with HIV infection. Preventive Services / Frequency Ages 64 to 68 years  Blood pressure check.** / Every 1 to 2 years.  Lipid and cholesterol check.** / Every 5 years beginning at age  22.  Clinical breast exam.** / Every 3 years for women in their 88s and 53s.  BRCA-related cancer risk assessment.** / For women who have family members with a BRCA-related cancer (breast, ovarian, tubal, or peritoneal cancers).  Pap test.** / Every 2 years from ages 90 through 51. Every 3 years starting at age 21 through age 56 or 3 with a history of 3 consecutive normal Pap tests.  HPV screening.** / Every 3 years from ages 24 through ages 1 to 46 with a history of 3 consecutive normal Pap tests.  Hepatitis C blood test.** / For any individual with known risks for hepatitis C.  Skin self-exam. / Monthly.  Influenza vaccine. / Every year.  Tetanus, diphtheria, and acellular pertussis (Tdap, Td) vaccine.** / Consult your health care provider. Pregnant women should receive 1 dose of Tdap vaccine during each pregnancy. 1 dose of Td every 10 years.  Varicella vaccine.** / Consult your health care provider. Pregnant females who do not have evidence of immunity should receive the first dose after pregnancy.  HPV vaccine. / 3 doses over 6 months, if 72 and younger. The vaccine is not recommended for use in pregnant females. However, pregnancy testing is not needed before receiving a dose.  Measles, mumps, rubella (MMR) vaccine.** / You need at least 1 dose of MMR if you were born in 1957 or later. You may also need a 2nd dose. For females of childbearing age, rubella immunity should be determined. If there is no evidence of immunity, females who are not pregnant should be vaccinated. If there is no evidence of immunity, females who are pregnant should delay immunization until after pregnancy.  Pneumococcal 13-valent conjugate (PCV13) vaccine.** / Consult your health care provider.  Pneumococcal polysaccharide (PPSV23) vaccine.** / 1 to 2 doses if you smoke cigarettes or if you have certain conditions.  Meningococcal vaccine.** /  1 dose if you are age 19 to 21 years and a first-year college  student living in a residence hall, or have one of several medical conditions, you need to get vaccinated against meningococcal disease. You may also need additional booster doses.  Hepatitis A vaccine.** / Consult your health care provider.  Hepatitis B vaccine.** / Consult your health care provider.  Haemophilus influenzae type b (Hib) vaccine.** / Consult your health care provider. Ages 40 to 64 years  Blood pressure check.** / Every 1 to 2 years.  Lipid and cholesterol check.** / Every 5 years beginning at age 20 years.  Lung cancer screening. / Every year if you are aged 55-80 years and have a 30-pack-year history of smoking and currently smoke or have quit within the past 15 years. Yearly screening is stopped once you have quit smoking for at least 15 years or develop a health problem that would prevent you from having lung cancer treatment.  Clinical breast exam.** / Every year after age 40 years.  BRCA-related cancer risk assessment.** / For women who have family members with a BRCA-related cancer (breast, ovarian, tubal, or peritoneal cancers).  Mammogram.** / Every year beginning at age 40 years and continuing for as long as you are in good health. Consult with your health care provider.  Pap test.** / Every 3 years starting at age 30 years through age 65 or 70 years with a history of 3 consecutive normal Pap tests.  HPV screening.** / Every 3 years from ages 30 years through ages 65 to 70 years with a history of 3 consecutive normal Pap tests.  Fecal occult blood test (FOBT) of stool. / Every year beginning at age 50 years and continuing until age 75 years. You may not need to do this test if you get a colonoscopy every 10 years.  Flexible sigmoidoscopy or colonoscopy.** / Every 5 years for a flexible sigmoidoscopy or every 10 years for a colonoscopy beginning at age 50 years and continuing until age 75 years.  Hepatitis C blood test.** / For all people born from 1945 through  1965 and any individual with known risks for hepatitis C.  Skin self-exam. / Monthly.  Influenza vaccine. / Every year.  Tetanus, diphtheria, and acellular pertussis (Tdap/Td) vaccine.** / Consult your health care provider. Pregnant women should receive 1 dose of Tdap vaccine during each pregnancy. 1 dose of Td every 10 years.  Varicella vaccine.** / Consult your health care provider. Pregnant females who do not have evidence of immunity should receive the first dose after pregnancy.  Zoster vaccine.** / 1 dose for adults aged 60 years or older.  Measles, mumps, rubella (MMR) vaccine.** / You need at least 1 dose of MMR if you were born in 1957 or later. You may also need a 2nd dose. For females of childbearing age, rubella immunity should be determined. If there is no evidence of immunity, females who are not pregnant should be vaccinated. If there is no evidence of immunity, females who are pregnant should delay immunization until after pregnancy.  Pneumococcal 13-valent conjugate (PCV13) vaccine.** / Consult your health care provider.  Pneumococcal polysaccharide (PPSV23) vaccine.** / 1 to 2 doses if you smoke cigarettes or if you have certain conditions.  Meningococcal vaccine.** / Consult your health care provider.  Hepatitis A vaccine.** / Consult your health care provider.  Hepatitis B vaccine.** / Consult your health care provider.  Haemophilus influenzae type b (Hib) vaccine.** / Consult your health care provider. Ages 65   years and over  Blood pressure check.** / Every 1 to 2 years.  Lipid and cholesterol check.** / Every 5 years beginning at age 22 years.  Lung cancer screening. / Every year if you are aged 73-80 years and have a 30-pack-year history of smoking and currently smoke or have quit within the past 15 years. Yearly screening is stopped once you have quit smoking for at least 15 years or develop a health problem that would prevent you from having lung cancer  treatment.  Clinical breast exam.** / Every year after age 4 years.  BRCA-related cancer risk assessment.** / For women who have family members with a BRCA-related cancer (breast, ovarian, tubal, or peritoneal cancers).  Mammogram.** / Every year beginning at age 40 years and continuing for as long as you are in good health. Consult with your health care provider.  Pap test.** / Every 3 years starting at age 9 years through age 34 or 91 years with 3 consecutive normal Pap tests. Testing can be stopped between 65 and 70 years with 3 consecutive normal Pap tests and no abnormal Pap or HPV tests in the past 10 years.  HPV screening.** / Every 3 years from ages 57 years through ages 64 or 45 years with a history of 3 consecutive normal Pap tests. Testing can be stopped between 65 and 70 years with 3 consecutive normal Pap tests and no abnormal Pap or HPV tests in the past 10 years.  Fecal occult blood test (FOBT) of stool. / Every year beginning at age 15 years and continuing until age 17 years. You may not need to do this test if you get a colonoscopy every 10 years.  Flexible sigmoidoscopy or colonoscopy.** / Every 5 years for a flexible sigmoidoscopy or every 10 years for a colonoscopy beginning at age 86 years and continuing until age 71 years.  Hepatitis C blood test.** / For all people born from [birth year] through 1966 and any individual with known risks for hepatitis C.  Osteoporosis screening.** / A one-time screening for women ages 83 years and over and women at risk for fractures or osteoporosis.  Skin self-exam. / Monthly.  Influenza vaccine. / Every year.  Tetanus, diphtheria, and acellular pertussis (Tdap/Td) vaccine.** / 1 dose of Td every 10 years.  Varicella vaccine.** / Consult your health care provider.  Zoster vaccine.** / 1 dose for adults aged 61 years or older.  Pneumococcal 13-valent conjugate (PCV13) vaccine.** / Consult your health care provider.  Pneumococcal  polysaccharide (PPSV23) vaccine.** / 1 dose for all adults aged 28 years and older.  Meningococcal vaccine.** / Consult your health care provider.  Hepatitis A vaccine.** / Consult your health care provider.  Hepatitis B vaccine.** / Consult your health care provider.  Haemophilus influenzae type b (Hib) vaccine.** / Consult your health care provider. ** Family history and personal history of risk and conditions may change your health care provider's recommendations. Document Released: 04/03/2001 Document Revised: 06/22/2013 Document Reviewed: 07/03/2010 [facility] Patient Information 2015 [place], [place]. This information is not intended to replace advice given to you by your health care provider. Make sure you discuss any questions you have with your health care provider.

## 2014-08-09 NOTE — Progress Notes (Signed)
Subjective:     Amy Mahoney is a 64 y.o. female and is here for a comprehensive physical exam. The patient reports no problems.  History   Social History  . Marital Status: Single    Spouse Name: N/A  . Number of Children: N/A  . 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    [name]     Social History Main Topics  . Smoking status: Former Smoker -- 0.50 packs/day for 3 years    Types: Cigarettes    Quit date: 08/10/1990  . Smokeless tobacco: Never Used  . Alcohol Use: Yes     Comment: rarely  . Drug Use: No  . Sexual Activity:    Partners: Male   Other Topics Concern  . Not on file   Social History Narrative   Regular exercise - yes   Caffeine use: 2 cups of coffee daily   Health Maintenance  Topic Date Due  . MAMMOGRAM  06/06/2014  . HIV Screening  05/04/2015 (Originally 10/20/1965)  . INFLUENZA VACCINE  09/20/2014  . COLONOSCOPY  01/01/2018  . TETANUS/TDAP  07/31/2020  . ZOSTAVAX  Completed    The following portions of the patient's history were reviewed and updated as appropriate:  She  has a past medical history of Depression; Spider bite; Sleep apnea; and Thyroid disease. She  does not have any pertinent problems on file. She  has past surgical history that includes Cholecystectomy; Dilation and curettage of uterus; Tonsillectomy; and fibroids scrapping. Her family history includes Arthritis in an other family member; Breast cancer in her maternal aunt and paternal grandmother; Cancer in her maternal aunt and maternal aunt; Depression in an other family member; Diabetes in her maternal aunt and mother; Hyperlipidemia in an other family member; Lung cancer in her maternal aunt. There is no history of Colon cancer, Pancreatic cancer, or Stomach cancer. She  reports that she quit smoking about 24 years ago. Her smoking use included Cigarettes. She has a 1.5 pack-year smoking history. She has never used smokeless tobacco. She reports  that she drinks alcohol. She reports that she does not use illicit drugs. She has a current medication list which includes the following prescription(s): antara, atorvastatin, calcium carbonate-vitamin d, hydrochlorothiazide, levothyroxine, multivitamin, and venlafaxine xr. Current Outpatient Prescriptions on File Prior to Visit  Medication Sig Dispense Refill  . ANTARA 90 MG CAPS TAKE 1 CAPSULE BY MOUTH DAILY. 90 capsule 1  . atorvastatin (LIPITOR) 20 MG tablet TAKE 1 TABLET BY MOUTH EVERY DAY 90 tablet 1  . Calcium Carbonate-Vitamin D (CALCIUM 500 + D PO) Take 1 tablet by mouth 2 (two) times daily.      . hydrochlorothiazide (HYDRODIURIL) 25 MG tablet TAKE 1 TABLET (25 MG TOTAL) BY MOUTH DAILY. (Patient taking differently: TAKE 1 TABLET (25 MG TOTAL) BY MOUTH DAILY or as needed) 100 tablet 1  . levothyroxine (SYNTHROID, LEVOTHROID) 88 MCG tablet Take 1 tablet (88 mcg total) by mouth daily before breakfast. 30 tablet 11  . Multiple Vitamin (MULTIVITAMIN) tablet Take 1 tablet by mouth daily.      [name] venlafaxine XR (EFFEXOR-XR) 150 MG 24 hr capsule TAKE ONE CAPSULE BY MOUTH EVERY DAY 90 capsule 3   No current facility-administered medications on file prior to visit.   She has No Known Allergies..  Review of Systems Review of Systems  Constitutional: Negative for activity change, appetite change and fatigue.  HENT: Negative for hearing loss, congestion, tinnitus and  ear discharge.  dentist q17m Eyes: Negative for visual disturbance (see optho q1y -- vision corrected to 20/20 with glasses).  Respiratory: Negative for cough, chest tightness and shortness of breath.   Cardiovascular: Negative for chest pain, palpitations and leg swelling.  Gastrointestinal: Negative for abdominal pain, diarrhea, constipation and abdominal distention.  Genitourinary: Negative for urgency, frequency, decreased urine volume and difficulty urinating.  Musculoskeletal: Negative for back pain, arthralgias and gait  problem.  Skin: Negative for color change, pallor and rash.  Neurological: Negative for dizziness, light-headedness, numbness and headaches.  Hematological: Negative for adenopathy. Does not bruise/bleed easily.  Psychiatric/Behavioral: Negative for suicidal ideas, confusion, sleep disturbance, self-injury, dysphoric mood, decreased concentration and agitation.      Objective:    BP 116/72 mmHg  Pulse 70  Temp(Src) 98.3 F (36.8 C) (Oral)  Ht 5\' 6"  (1.676 m)  Wt 277 lb 6.4 oz (125.828 kg)  BMI 44.79 kg/m2  SpO2 99% General appearance: alert, cooperative, appears stated age and no distress Head: Normocephalic, without obvious abnormality, atraumatic Eyes: conjunctivae/corneas clear. PERRL, EOM's intact. Fundi benign. Ears: normal TM's and external ear canals both ears Nose: Nares normal. Septum midline. Mucosa normal. No drainage or sinus tenderness. Throat: lips, mucosa, and tongue normal; teeth and gums normal Neck: no adenopathy, no carotid bruit, no JVD, supple, symmetrical, trachea midline and thyroid not enlarged, symmetric, no tenderness/mass/nodules Back: symmetric, no curvature. ROM normal. No CVA tenderness. Lungs: clear to auscultation bilaterally Breasts: normal appearance, no masses or tenderness Heart: regular rate and rhythm, S1, S2 normal, no murmur, click, rub or gallop Abdomen: soft, non-tender; bowel sounds normal; no masses,  no organomegaly Pelvic: cervix normal in appearance, external genitalia normal, no adnexal masses or tenderness, no cervical motion tenderness, positive findings: vaginal discharge:  white, thin and malodorous, rectovaginal septum normal, uterus normal size, shape, and consistency and vagina normal without discharge Extremities: extremities normal, atraumatic, no cyanosis or edema Pulses: 2+ and symmetric Skin: Skin color, texture, turgor normal. No rashes or lesions Lymph nodes: Cervical, supraclavicular, and axillary nodes  normal. Neurologic: Alert and oriented X 3, normal strength and tone. Normal symmetric reflexes. Normal coordination and gait Psych--no depression, no anxiety      Assessment:    Healthy female exam.      Plan:     ghm utd Check labs See After Visit Summary for Counseling Recommendations    1. Estrogen deficiency    2. Encounter for screening mammogram for malignant neoplasm of breast   - MM Digital Screening; Future  3. Hyperlipidemia   - Basic metabolic panel - CBC with Differential/Platelet - Hepatic function panel - Lipid panel - POCT urinalysis dipstick - TSH  4. Preventative health care   - Basic metabolic panel - CBC with Differential/Platelet - Hepatic function panel - Lipid panel - POCT urinalysis dipstick - TSH

## 2014-08-09 NOTE — Progress Notes (Signed)
Pre visit review using our clinic review tool, if applicable. No additional management support is needed unless otherwise documented below in the visit note. 

## 2014-08-09 NOTE — Addendum Note (Signed)
Addended by: Rosalita Chessman on: 08/09/2014 06:07 PM   Modules accepted: Orders

## 2014-08-10 LAB — BASIC METABOLIC PANEL
BUN: 15 mg/dL (ref 6–23)
CALCIUM: 10.4 mg/dL (ref 8.4–10.5)
CO2: 32 meq/L (ref 19–32)
Chloride: 103 mEq/L (ref 96–112)
Creatinine, Ser: 0.92 mg/dL (ref 0.40–1.20)
GFR: 65.16 mL/min (ref >60.00)
Glucose, Bld: 106 mg/dL — ABNORMAL HIGH (ref 70–99)
Potassium: 4.5 mEq/L (ref 3.5–5.1)
SODIUM: 142 meq/L (ref 135–145)

## 2014-08-10 LAB — LIPID PANEL
CHOLESTEROL: 139 mg/dL (ref 0–200)
HDL: 45.3 mg/dL (ref >39.00)
LDL Cholesterol: 74 mg/dL (ref 0–99)
NONHDL: 93.7
Total CHOL/HDL Ratio: 3
Triglycerides: 98 mg/dL (ref 0.0–149.0)
VLDL: 19.6 mg/dL (ref 0.0–40.0)

## 2014-08-10 LAB — CBC WITH DIFFERENTIAL/PLATELET
Basophils Absolute: 0.2 10*3/uL — ABNORMAL HIGH (ref 0.0–0.1)
Basophils Relative: 2.2 % (ref 0.0–3.0)
Eosinophils Absolute: 0.3 10*3/uL (ref 0.0–0.7)
Eosinophils Relative: 4.5 % (ref 0.0–5.0)
HCT: 41.2 % (ref 36.0–46.0)
Hemoglobin: 13.8 g/dL (ref 12.0–15.0)
LYMPHS PCT: 35.6 % (ref 12.0–46.0)
Lymphs Abs: 2.6 10*3/uL (ref 0.7–4.0)
MCHC: 33.4 g/dL (ref 30.0–36.0)
MCV: 91.1 fl (ref 78.0–100.0)
Monocytes Absolute: 0.5 10*3/uL (ref 0.1–1.0)
Monocytes Relative: 6.5 % (ref 3.0–12.0)
NEUTROS ABS: 3.7 10*3/uL (ref 1.4–7.7)
Neutrophils Relative %: 51.2 % (ref 43.0–77.0)
PLATELETS: 292 10*3/uL (ref 150.0–400.0)
RBC: 4.53 Mil/uL (ref 3.87–5.11)
RDW: 13.8 % (ref 11.5–15.5)
WBC: 7.2 10*3/uL (ref 4.0–10.5)

## 2014-08-10 LAB — HEPATIC FUNCTION PANEL
ALBUMIN: 4.3 g/dL (ref 3.5–5.2)
ALT: 22 U/L (ref 0–35)
AST: 30 U/L (ref 0–37)
Alkaline Phosphatase: 53 U/L (ref 39–117)
Bilirubin, Direct: 0.1 mg/dL (ref 0.0–0.3)
Total Bilirubin: 0.4 mg/dL (ref 0.2–1.2)
Total Protein: 7.1 g/dL (ref 6.0–8.3)

## 2014-08-10 LAB — URINE CULTURE
Colony Count: NO GROWTH
Organism ID, Bacteria: NO GROWTH

## 2014-08-10 LAB — TSH: TSH: 3.44 u[IU]/mL (ref 0.35–4.50)

## 2014-08-11 LAB — CYTOLOGY - PAP

## 2014-08-12 ENCOUNTER — Telehealth: Payer: Self-pay | Admitting: Family Medicine

## 2014-08-12 LAB — CERVICOVAGINAL ANCILLARY ONLY: CANDIDA VAGINITIS: NEGATIVE

## 2014-08-12 NOTE — Telephone Encounter (Signed)
Relation to pt: self  Call back number: [phone]   Reason for call:  Pt returning your call regarding urine results patient will be at this # till 4:30pm work # [phone]

## 2014-08-13 ENCOUNTER — Telehealth: Payer: Self-pay | Admitting: Family Medicine

## 2014-08-13 ENCOUNTER — Ambulatory Visit (HOSPITAL_BASED_OUTPATIENT_CLINIC_OR_DEPARTMENT_OTHER)
Admission: RE | Admit: 2014-08-13 | Discharge: 2014-08-13 | Disposition: A | Discharge status: Home / Self Care | Payer: Commercial Managed Care - PPO | Source: Ambulatory Visit | Attending: Family Medicine | Admitting: Family Medicine

## 2014-08-13 DIAGNOSIS — Z1231 Encounter for screening mammogram for malignant neoplasm of breast: Secondary | ICD-10-CM | POA: Diagnosis present

## 2014-08-13 NOTE — Telephone Encounter (Signed)
Patient states she missed your calls and will have her cell phone on today. [phone]

## 2014-08-13 NOTE — Telephone Encounter (Signed)
Duplicate note.     KP

## 2014-09-14 ENCOUNTER — Encounter: Payer: Self-pay | Admitting: Family Medicine

## 2014-09-15 ENCOUNTER — Telehealth: Payer: Self-pay | Admitting: Family Medicine

## 2014-09-15 DIAGNOSIS — N898 Other specified noninflammatory disorders of vagina: Secondary | ICD-10-CM

## 2014-09-15 NOTE — Telephone Encounter (Signed)
Lab appt sched for 09/17/14 8:00am for urine test. Please enter order based on mychart msgs.

## 2014-09-16 NOTE — Telephone Encounter (Signed)
Order in      KP 

## 2014-09-17 ENCOUNTER — Other Ambulatory Visit (HOSPITAL_COMMUNITY)
Admission: RE | Admit: 2014-09-17 | Discharge: 2014-09-17 | Disposition: A | Discharge status: Home / Self Care | Payer: Commercial Managed Care - PPO | Source: Ambulatory Visit | Attending: Family Medicine | Admitting: Family Medicine

## 2014-09-17 ENCOUNTER — Other Ambulatory Visit (INDEPENDENT_AMBULATORY_CARE_PROVIDER_SITE_OTHER): Payer: Commercial Managed Care - PPO

## 2014-09-17 DIAGNOSIS — E785 Hyperlipidemia, unspecified: Secondary | ICD-10-CM | POA: Diagnosis not present

## 2014-09-17 DIAGNOSIS — E039 Hypothyroidism, unspecified: Secondary | ICD-10-CM | POA: Diagnosis not present

## 2014-09-17 DIAGNOSIS — I10 Essential (primary) hypertension: Secondary | ICD-10-CM

## 2014-09-17 DIAGNOSIS — N760 Acute vaginitis: Secondary | ICD-10-CM | POA: Insufficient documentation

## 2014-09-17 DIAGNOSIS — N898 Other specified noninflammatory disorders of vagina: Secondary | ICD-10-CM

## 2014-09-17 LAB — POCT URINALYSIS DIPSTICK
Bilirubin, UA: NEGATIVE
Blood, UA: NEGATIVE
GLUCOSE UA: NEGATIVE
KETONES UA: NEGATIVE
Nitrite, UA: NEGATIVE
Protein, UA: NEGATIVE
SPEC GRAV UA: 1.015
UROBILINOGEN UA: 0.2
pH, UA: 7.5

## 2014-09-17 NOTE — Addendum Note (Signed)
Addended by: Tasia Catchings on: 09/17/2014 08:24 AM   Modules accepted: Orders

## 2014-09-17 NOTE — Addendum Note (Signed)
Addended by: Tasia Catchings on: 09/17/2014 08:40 AM   Modules accepted: Orders

## 2014-09-18 LAB — URINE CULTURE

## 2014-09-20 LAB — URINE CYTOLOGY ANCILLARY ONLY
Bacterial vaginitis: POSITIVE — AB
CANDIDA VAGINITIS: NEGATIVE
Trichomonas: NEGATIVE

## 2014-09-29 ENCOUNTER — Other Ambulatory Visit: Payer: Self-pay | Admitting: Family Medicine

## 2014-10-26 ENCOUNTER — Other Ambulatory Visit: Payer: Self-pay | Admitting: Family Medicine

## 2014-10-29 ENCOUNTER — Encounter: Payer: Self-pay | Admitting: Family Medicine

## 2014-10-29 DIAGNOSIS — H9190 Unspecified hearing loss, unspecified ear: Secondary | ICD-10-CM

## 2014-10-29 NOTE — Telephone Encounter (Signed)
Refer to audiology.

## 2014-11-01 ENCOUNTER — Encounter: Payer: Self-pay | Admitting: Family Medicine

## 2014-11-19 ENCOUNTER — Other Ambulatory Visit (HOSPITAL_COMMUNITY)
Admission: RE | Admit: 2014-11-19 | Discharge: 2014-11-19 | Disposition: A | Discharge status: Home / Self Care | Payer: Commercial Managed Care - PPO | Source: Ambulatory Visit | Attending: Family Medicine | Admitting: Family Medicine

## 2014-11-19 ENCOUNTER — Ambulatory Visit (INDEPENDENT_AMBULATORY_CARE_PROVIDER_SITE_OTHER): Payer: Commercial Managed Care - PPO | Admitting: Family Medicine

## 2014-11-19 ENCOUNTER — Encounter: Payer: Self-pay | Admitting: Family Medicine

## 2014-11-19 VITALS — BP 118/76 | HR 76 | Temp 98.1°F | Wt 278.2 lb

## 2014-11-19 DIAGNOSIS — N760 Acute vaginitis: Secondary | ICD-10-CM | POA: Diagnosis not present

## 2014-11-19 DIAGNOSIS — N898 Other specified noninflammatory disorders of vagina: Secondary | ICD-10-CM

## 2014-11-19 DIAGNOSIS — Z23 Encounter for immunization: Secondary | ICD-10-CM | POA: Diagnosis not present

## 2014-11-19 DIAGNOSIS — E2839 Other primary ovarian failure: Secondary | ICD-10-CM

## 2014-11-19 DIAGNOSIS — M62830 Muscle spasm of back: Secondary | ICD-10-CM | POA: Diagnosis not present

## 2014-11-19 DIAGNOSIS — Z1159 Encounter for screening for other viral diseases: Secondary | ICD-10-CM | POA: Diagnosis not present

## 2014-11-19 HISTORY — DX: Muscle spasm of back: M62.830

## 2014-11-19 MED ORDER — FLUCONAZOLE 150 MG PO TABS: 150.0000 mg | ORAL_TABLET | Freq: Once | ORAL | Status: DC

## 2014-11-19 MED ORDER — CYCLOBENZAPRINE HCL 10 MG PO TABS: 10.0000 mg | ORAL_TABLET | Freq: Three times a day (TID) | ORAL | Status: DC | PRN

## 2014-11-19 NOTE — Addendum Note (Signed)
Addended by: Ewing Schlein on: 11/19/2014 03:29 PM   Modules accepted: Orders

## 2014-11-19 NOTE — Assessment & Plan Note (Signed)
Flexeril 10 mg 1 po tid  Massage accupressure with rolling pin at home rto prn

## 2014-11-19 NOTE — Patient Instructions (Signed)

## 2014-11-19 NOTE — Progress Notes (Signed)
Patient ID: Amy Mahoney, female    DOB: 1950-07-05  Age: 64 y.o. MRN: [id number]    Subjective:  Subjective HPI Amy Mahoney presents for vaginal irritation, no d/c .   No pelvic pain .   She also c/o dull headache in back of head and base of the skull x 3 weeks .   Pt has tried IB with some relief but heat helps the most.     Review of Systems  Constitutional: Negative for diaphoresis, appetite change, fatigue and unexpected weight change.  Eyes: Negative for pain, redness and visual disturbance.  Respiratory: Negative for cough, chest tightness, shortness of breath and wheezing.   Cardiovascular: Negative for chest pain, palpitations and leg swelling.  Endocrine: Negative for cold intolerance, heat intolerance, polydipsia, polyphagia and polyuria.  Genitourinary: Negative for dysuria, frequency and difficulty urinating.  Neurological: Negative for dizziness, light-headedness, numbness and headaches.    History Past Medical History  Diagnosis Date  . Depression   . Spider bite   . Sleep apnea   . Thyroid disease     hypothyroidism    She has past surgical history that includes Cholecystectomy; Dilation and curettage of uterus; Tonsillectomy; and fibroids scrapping.   Her family history includes Arthritis in an other family member; Breast cancer in her maternal aunt and paternal grandmother; Cancer in her maternal aunt and maternal aunt; Depression in an other family member; Diabetes in her maternal aunt and mother; Hyperlipidemia in an other family member; Lung cancer in her maternal aunt. There is no history of Colon cancer, Pancreatic cancer, or Stomach cancer.She reports that she quit smoking about 24 years ago. Her smoking use included Cigarettes. She has a 1.5 pack-year smoking history. She has never used smokeless tobacco. She reports that she drinks alcohol. She reports that she does not use illicit drugs.  Current Outpatient Prescriptions on File Prior to Visit  Medication  Sig Dispense Refill  . ANTARA 90 MG CAPS TAKE 1 CAPSULE BY MOUTH DAILY. 90 capsule 1  . atorvastatin (LIPITOR) 20 MG tablet TAKE 1 TABLET BY MOUTH EVERY DAY 90 tablet 1  . Calcium Carbonate-Vitamin D (CALCIUM 500 + D PO) Take 1 tablet by mouth 2 (two) times daily.      . hydrochlorothiazide (HYDRODIURIL) 25 MG tablet TAKE 1 TABLET (25 MG TOTAL) BY MOUTH DAILY. (Patient taking differently: TAKE 1 TABLET (25 MG TOTAL) BY MOUTH DAILY or as needed) 100 tablet 1  . levothyroxine (SYNTHROID, LEVOTHROID) 88 MCG tablet Take 1 tablet (88 mcg total) by mouth daily before breakfast. 30 tablet 11  . metroNIDAZOLE (FLAGYL) 500 MG tablet Take 1 tablet (500 mg total) by mouth 2 (two) times daily. 14 tablet 0  . Multiple Vitamin (MULTIVITAMIN) tablet Take 1 tablet by mouth daily.      [name] venlafaxine XR (EFFEXOR-XR) 150 MG 24 hr capsule TAKE ONE CAPSULE BY MOUTH EVERY DAY 90 capsule 3   No current facility-administered medications on file prior to visit.     Objective:  Objective Physical Exam  Constitutional: She is oriented to person, place, and time. She appears well-developed and well-nourished.  HENT:  Head: Normocephalic and atraumatic.  Eyes: Conjunctivae and EOM are normal.  Neck: Normal range of motion. Neck supple. No JVD present. Carotid bruit is not present. No thyromegaly present.  Cardiovascular: Normal rate, regular rhythm and normal heart sounds.   No murmur heard. Pulmonary/Chest: Effort normal and breath sounds normal. No respiratory distress. She has no wheezes. She has no  rales. She exhibits no tenderness.  Genitourinary: There is no rash, tenderness, lesion or injury on the right labia. There is no rash, tenderness, lesion or injury on the left labia. Cervix exhibits discharge. Cervix exhibits no motion tenderness and no friability. There is erythema in the vagina. No tenderness or bleeding in the vagina. No foreign body around the vagina. Vaginal discharge found.  + thick white d/c No  odor Labia slightly errythematous  Musculoskeletal: She exhibits no edema.       Cervical back: She exhibits tenderness, pain and spasm. She exhibits normal range of motion.       Back:  Neurological: She is alert and oriented to person, place, and time.  Psychiatric: She has a normal mood and affect. Her behavior is normal.   BP 118/76 mmHg  Pulse 76  Temp(Src) 98.1 F (36.7 C) (Oral)  Wt 278 lb 3.2 oz (126.191 kg)  SpO2 96% Wt Readings from Last 3 Encounters:  11/19/14 278 lb 3.2 oz (126.191 kg)  08/09/14 277 lb 6.4 oz (125.828 kg)  07/01/14 274 lb (124.286 kg)     Lab Results  Component Value Date   WBC 7.2 08/09/2014   HGB 13.8 08/09/2014   HCT 41.2 08/09/2014   PLT 292.0 08/09/2014   GLUCOSE 106* 08/09/2014   CHOL 139 08/09/2014   TRIG 98.0 08/09/2014   HDL 45.30 08/09/2014   LDLDIRECT 193.3 11/06/2012   LDLCALC 74 08/09/2014   ALT 22 08/09/2014   AST 30 08/09/2014   NA 142 08/09/2014   K 4.5 08/09/2014   CL 103 08/09/2014   CREATININE 0.92 08/09/2014   BUN 15 08/09/2014   CO2 32 08/09/2014   TSH 3.44 08/09/2014   HGBA1C 6.2 01/07/2014    No results found.   Assessment & Plan:  Plan I am having Amy Mahoney start on cyclobenzaprine and fluconazole. I am also having her maintain her Calcium Carbonate-Vitamin D (CALCIUM 500 + D PO), multivitamin, levothyroxine, venlafaxine XR, hydrochlorothiazide, metroNIDAZOLE, atorvastatin, and ANTARA.  Meds ordered this encounter  Medications  . cyclobenzaprine (FLEXERIL) 10 MG tablet    Sig: Take 1 tablet (10 mg total) by mouth 3 (three) times daily as needed for muscle spasms.    Dispense:  30 tablet    Refill:  0  . fluconazole (DIFLUCAN) 150 MG tablet    Sig: Take 1 tablet (150 mg total) by mouth once.    Dispense:  2 tablet    Refill:  0    Problem List Items Addressed This Visit    Muscle spasm of back - Primary    Flexeril 10 mg 1 po tid  Massage accupressure with rolling pin at home rto prn         Other Visit Diagnoses    Estrogen deficiency        Relevant Orders    DG Bone Density    Need for hepatitis C screening test        Relevant Orders    Hepatitis C Antibody    Vaginal discharge        Relevant Orders    KOH       Follow-up: No Follow-up on file.  Garnet Koyanagi, DO

## 2014-11-19 NOTE — Progress Notes (Signed)
Pre visit review using our clinic review tool, if applicable. No additional management support is needed unless otherwise documented below in the visit note. 

## 2014-11-20 LAB — HEPATITIS C ANTIBODY: HCV AB: NEGATIVE

## 2014-11-23 LAB — CERVICOVAGINAL ANCILLARY ONLY: Wet Prep (BD Affirm): POSITIVE — AB

## 2014-11-24 ENCOUNTER — Telehealth: Payer: Self-pay | Admitting: Family Medicine

## 2014-11-24 ENCOUNTER — Other Ambulatory Visit: Payer: Self-pay

## 2014-11-24 MED ORDER — METRONIDAZOLE 0.75 % VA GEL: 1.0000 | Freq: Every day | VAGINAL | Status: DC

## 2014-11-24 NOTE — Telephone Encounter (Signed)
Notified pt per 11/19/14 result note and she voices understanding.

## 2014-11-24 NOTE — Telephone Encounter (Signed)
Caller name:Alvina Audree Camel Relationship to patient:self Can be reached:[phone] Pharmacy:  Reason for call:returning call

## 2014-12-02 ENCOUNTER — Other Ambulatory Visit: Payer: Self-pay | Admitting: Family Medicine

## 2014-12-14 ENCOUNTER — Encounter: Payer: Self-pay | Admitting: Family Medicine

## 2014-12-17 ENCOUNTER — Encounter: Payer: Self-pay | Admitting: Family Medicine

## 2014-12-17 ENCOUNTER — Ambulatory Visit (INDEPENDENT_AMBULATORY_CARE_PROVIDER_SITE_OTHER): Payer: Commercial Managed Care - PPO | Admitting: Family Medicine

## 2014-12-17 ENCOUNTER — Other Ambulatory Visit (HOSPITAL_COMMUNITY)
Admission: RE | Admit: 2014-12-17 | Discharge: 2014-12-17 | Disposition: A | Discharge status: Home / Self Care | Payer: Commercial Managed Care - PPO | Source: Ambulatory Visit | Attending: Family Medicine | Admitting: Family Medicine

## 2014-12-17 VITALS — BP 126/84 | HR 87 | Temp 98.4°F | Wt 284.2 lb

## 2014-12-17 DIAGNOSIS — A499 Bacterial infection, unspecified: Secondary | ICD-10-CM | POA: Diagnosis not present

## 2014-12-17 DIAGNOSIS — B9689 Other specified bacterial agents as the cause of diseases classified elsewhere: Secondary | ICD-10-CM

## 2014-12-17 DIAGNOSIS — N760 Acute vaginitis: Secondary | ICD-10-CM | POA: Insufficient documentation

## 2014-12-17 HISTORY — DX: Other specified bacterial agents as the cause of diseases classified elsewhere: B96.89

## 2014-12-17 HISTORY — DX: Other specified bacterial agents as the cause of diseases classified elsewhere: N76.0

## 2014-12-17 MED ORDER — METRONIDAZOLE 500 MG PO TABS: 500.0000 mg | ORAL_TABLET | Freq: Two times a day (BID) | ORAL | Status: DC

## 2014-12-17 NOTE — Assessment & Plan Note (Signed)
Flagyl 500 mg bid x 7 days Take diflucan as well rto prn

## 2014-12-17 NOTE — Progress Notes (Signed)
Patient ID: Amy Mahoney, female    DOB: 1950-05-03  Age: 64 y.o. MRN: [id number]    Subjective:  Subjective HPI Amy Mahoney presents for f/u BV.  Pt still has d/c and now has vag itching.  No other complaints.    Review of Systems  Constitutional: Negative for diaphoresis, appetite change, fatigue and unexpected weight change.  Eyes: Negative for pain, redness and visual disturbance.  Respiratory: Negative for cough, chest tightness, shortness of breath and wheezing.   Cardiovascular: Negative for chest pain, palpitations and leg swelling.  Endocrine: Negative for cold intolerance, heat intolerance, polydipsia, polyphagia and polyuria.  Genitourinary: Positive for vaginal discharge. Negative for dysuria, urgency, frequency, hematuria, decreased urine volume, vaginal bleeding, difficulty urinating, genital sores, menstrual problem and pelvic pain.  Neurological: Negative for dizziness, light-headedness, numbness and headaches.    History Past Medical History  Diagnosis Date  . Depression   . Spider bite   . Sleep apnea   . Thyroid disease     hypothyroidism    She has past surgical history that includes Cholecystectomy; Dilation and curettage of uterus; Tonsillectomy; and fibroids scrapping.   Her family history includes Arthritis in an other family member; Breast cancer in her maternal aunt and paternal grandmother; Cancer in her maternal aunt and maternal aunt; Depression in an other family member; Diabetes in her maternal aunt and mother; Hyperlipidemia in an other family member; Lung cancer in her maternal aunt. There is no history of Colon cancer, Pancreatic cancer, or Stomach cancer.She reports that she quit smoking about 24 years ago. Her smoking use included Cigarettes. She has a 1.5 pack-year smoking history. She has never used smokeless tobacco. She reports that she drinks alcohol. She reports that she does not use illicit drugs.  Current Outpatient Prescriptions on File  Prior to Visit  Medication Sig Dispense Refill  . ANTARA 90 MG CAPS TAKE 1 CAPSULE BY MOUTH DAILY. 90 capsule 1  . atorvastatin (LIPITOR) 20 MG tablet TAKE 1 TABLET BY MOUTH EVERY DAY 90 tablet 1  . Calcium Carbonate-Vitamin D (CALCIUM 500 + D PO) Take 1 tablet by mouth 2 (two) times daily.      . cyclobenzaprine (FLEXERIL) 10 MG tablet Take 1 tablet (10 mg total) by mouth 3 (three) times daily as needed for muscle spasms. 30 tablet 0  . fluconazole (DIFLUCAN) 150 MG tablet Take 1 tablet (150 mg total) by mouth once. 2 tablet 0  . hydrochlorothiazide (HYDRODIURIL) 25 MG tablet TAKE 1 TABLET BY MOUTH EVERY DAY (MAX ON INS) 90 tablet 1  . levothyroxine (SYNTHROID, LEVOTHROID) 88 MCG tablet TAKE 1 TABLET (88 MCG TOTAL) BY MOUTH DAILY BEFORE BREAKFAST. 30 tablet 11  . metroNIDAZOLE (METROGEL VAGINAL) 0.75 % vaginal gel Place 1 Applicatorful vaginally at bedtime. x's 5 night 70 g 0  . Multiple Vitamin (MULTIVITAMIN) tablet Take 1 tablet by mouth daily.      [name] venlafaxine XR (EFFEXOR-XR) 150 MG 24 hr capsule TAKE ONE CAPSULE BY MOUTH EVERY DAY 90 capsule 3   No current facility-administered medications on file prior to visit.     Objective:  Objective Physical Exam  Constitutional: She appears well-developed and well-nourished.  Abdominal: Soft. She exhibits no distension. There is no tenderness. There is no rebound, no guarding and no CVA tenderness.  Genitourinary: Pelvic exam was performed with patient supine. There is no rash, tenderness, lesion or injury on the right labia. There is no rash, tenderness, lesion or injury on the left labia. Cervix  exhibits discharge. Cervix exhibits no motion tenderness and no friability. No erythema or tenderness in the vagina. Vaginal discharge found.   BP 126/84 mmHg  Pulse 87  Temp(Src) 98.4 F (36.9 C) (Oral)  Wt 284 lb 3.2 oz (128.912 kg)  SpO2 98% Wt Readings from Last 3 Encounters:  12/17/14 284 lb 3.2 oz (128.912 kg)  11/19/14 278 lb 3.2 oz  (126.191 kg)  08/09/14 277 lb 6.4 oz (125.828 kg)     Lab Results  Component Value Date   WBC 7.2 08/09/2014   HGB 13.8 08/09/2014   HCT 41.2 08/09/2014   PLT 292.0 08/09/2014   GLUCOSE 106* 08/09/2014   CHOL 139 08/09/2014   TRIG 98.0 08/09/2014   HDL 45.30 08/09/2014   LDLDIRECT 193.3 11/06/2012   LDLCALC 74 08/09/2014   ALT 22 08/09/2014   AST 30 08/09/2014   NA 142 08/09/2014   K 4.5 08/09/2014   CL 103 08/09/2014   CREATININE 0.92 08/09/2014   BUN 15 08/09/2014   CO2 32 08/09/2014   TSH 3.44 08/09/2014   HGBA1C 6.2 01/07/2014    No results found.   Assessment & Plan:  Plan I am having Ms. Prill start on metroNIDAZOLE. I am also having her maintain her Calcium Carbonate-Vitamin D (CALCIUM 500 + D PO), multivitamin, venlafaxine XR, atorvastatin, ANTARA, cyclobenzaprine, fluconazole, metroNIDAZOLE, levothyroxine, and hydrochlorothiazide.  Meds ordered this encounter  Medications  . metroNIDAZOLE (FLAGYL) 500 MG tablet    Sig: Take 1 tablet (500 mg total) by mouth 2 (two) times daily.    Dispense:  14 tablet    Refill:  0    Problem List Items Addressed This Visit    None    Visit Diagnoses    Bacterial vaginitis        Relevant Medications    metroNIDAZOLE (FLAGYL) 500 MG tablet    Other Relevant Orders    Cervicovaginal ancillary only      take the diflucan also  Follow-up: Return if symptoms worsen or fail to improve.  Garnet Koyanagi, DO

## 2014-12-17 NOTE — Progress Notes (Signed)
Pre visit review using our clinic review tool, if applicable. No additional management support is needed unless otherwise documented below in the visit note. 

## 2014-12-17 NOTE — Patient Instructions (Signed)

## 2014-12-20 ENCOUNTER — Ambulatory Visit (HOSPITAL_BASED_OUTPATIENT_CLINIC_OR_DEPARTMENT_OTHER)
Admission: RE | Admit: 2014-12-20 | Discharge: 2014-12-20 | Disposition: A | Discharge status: Home / Self Care | Payer: Commercial Managed Care - PPO | Source: Ambulatory Visit | Attending: Family Medicine | Admitting: Family Medicine

## 2014-12-20 DIAGNOSIS — Z79899 Other long term (current) drug therapy: Secondary | ICD-10-CM | POA: Insufficient documentation

## 2014-12-20 DIAGNOSIS — E2839 Other primary ovarian failure: Secondary | ICD-10-CM | POA: Diagnosis not present

## 2014-12-20 DIAGNOSIS — Z13820 Encounter for screening for osteoporosis: Secondary | ICD-10-CM | POA: Diagnosis present

## 2014-12-20 DIAGNOSIS — Z780 Asymptomatic menopausal state: Secondary | ICD-10-CM | POA: Diagnosis not present

## 2014-12-20 DIAGNOSIS — Z87891 Personal history of nicotine dependence: Secondary | ICD-10-CM | POA: Insufficient documentation

## 2014-12-21 LAB — CERVICOVAGINAL ANCILLARY ONLY: WET PREP (BD AFFIRM): POSITIVE — AB

## 2015-02-17 ENCOUNTER — Other Ambulatory Visit: Payer: Self-pay | Admitting: Family Medicine

## 2015-03-23 ENCOUNTER — Other Ambulatory Visit: Payer: Self-pay | Admitting: Family Medicine

## 2015-04-14 ENCOUNTER — Ambulatory Visit (INDEPENDENT_AMBULATORY_CARE_PROVIDER_SITE_OTHER): Payer: Commercial Managed Care - PPO | Admitting: Family Medicine

## 2015-04-14 ENCOUNTER — Other Ambulatory Visit (HOSPITAL_COMMUNITY)
Admission: RE | Admit: 2015-04-14 | Discharge: 2015-04-14 | Disposition: A | Discharge status: Home / Self Care | Payer: Commercial Managed Care - PPO | Source: Ambulatory Visit | Attending: Family Medicine | Admitting: Family Medicine

## 2015-04-14 ENCOUNTER — Encounter: Payer: Self-pay | Admitting: Family Medicine

## 2015-04-14 VITALS — BP 118/74 | HR 77 | Temp 98.5°F | Wt 278.0 lb

## 2015-04-14 DIAGNOSIS — L02818 Cutaneous abscess of other sites: Secondary | ICD-10-CM | POA: Diagnosis not present

## 2015-04-14 DIAGNOSIS — N760 Acute vaginitis: Secondary | ICD-10-CM

## 2015-04-14 DIAGNOSIS — L02214 Cutaneous abscess of groin: Secondary | ICD-10-CM

## 2015-04-14 DIAGNOSIS — A499 Bacterial infection, unspecified: Secondary | ICD-10-CM

## 2015-04-14 DIAGNOSIS — N898 Other specified noninflammatory disorders of vagina: Secondary | ICD-10-CM | POA: Diagnosis not present

## 2015-04-14 DIAGNOSIS — B9689 Other specified bacterial agents as the cause of diseases classified elsewhere: Secondary | ICD-10-CM

## 2015-04-14 MED ORDER — METRONIDAZOLE 500 MG PO TABS: 500.0000 mg | ORAL_TABLET | Freq: Two times a day (BID) | ORAL | Status: DC

## 2015-04-14 MED ORDER — DOXYCYCLINE HYCLATE 100 MG PO TABS: 100.0000 mg | ORAL_TABLET | Freq: Two times a day (BID) | ORAL | Status: DC

## 2015-04-14 NOTE — Progress Notes (Signed)
Pre visit review using our clinic review tool, if applicable. No additional management support is needed unless otherwise documented below in the visit note. 

## 2015-04-14 NOTE — Patient Instructions (Signed)

## 2015-04-15 DIAGNOSIS — L0291 Cutaneous abscess, unspecified: Secondary | ICD-10-CM | POA: Insufficient documentation

## 2015-04-15 HISTORY — DX: Cutaneous abscess, unspecified: L02.91

## 2015-04-15 NOTE — Assessment & Plan Note (Signed)
Warm compress or soak in tub with epson salt  abx per orders Call if no relief

## 2015-04-15 NOTE — Progress Notes (Signed)
Patient ID: SHENICA BLIVEN, female    DOB: 21-Aug-1950  Age: 65 y.o. MRN: [id number]    Subjective:  Subjective HPI KRUTHI BONDOC presents with c/o vaginal d/c and odor x few days.  No otc meds.  No abd pain.  No dysuria.    Review of Systems  Constitutional: Negative for diaphoresis, appetite change, fatigue and unexpected weight change.  Eyes: Negative for pain, redness and visual disturbance.  Respiratory: Negative for cough, chest tightness, shortness of breath and wheezing.   Cardiovascular: Negative for chest pain, palpitations and leg swelling.  Endocrine: Negative for cold intolerance, heat intolerance, polydipsia, polyphagia and polyuria.  Genitourinary: Positive for vaginal discharge. Negative for dysuria, urgency, frequency, decreased urine volume, vaginal bleeding, difficulty urinating, genital sores, vaginal pain and pelvic pain.  Neurological: Negative for dizziness, light-headedness, numbness and headaches.    History Past Medical History  Diagnosis Date  . Depression   . Spider bite   . Sleep apnea   . Thyroid disease     hypothyroidism    She has past surgical history that includes Cholecystectomy; Dilation and curettage of uterus; Tonsillectomy; and fibroids scrapping.   Her family history includes Breast cancer in her maternal aunt and paternal grandmother; Cancer in her maternal aunt and maternal aunt; Diabetes in her maternal aunt and mother; Lung cancer in her maternal aunt. There is no history of Colon cancer, Pancreatic cancer, or Stomach cancer.She reports that she quit smoking about 24 years ago. Her smoking use included Cigarettes. She has a 1.5 pack-year smoking history. She has never used smokeless tobacco. She reports that she drinks alcohol. She reports that she does not use illicit drugs.  Current Outpatient Prescriptions on File Prior to Visit  Medication Sig Dispense Refill  . ANTARA 90 MG CAPS TAKE 1 CAPSULE BY MOUTH DAILY. 90 capsule 1  .  atorvastatin (LIPITOR) 20 MG tablet Take 1 tablet (20 mg total) by mouth daily. Repeat labs are due now 90 tablet 0  . Calcium Carbonate-Vitamin D (CALCIUM 500 + D PO) Take 1 tablet by mouth 2 (two) times daily.      . cyclobenzaprine (FLEXERIL) 10 MG tablet Take 1 tablet (10 mg total) by mouth 3 (three) times daily as needed for muscle spasms. 30 tablet 0  . hydrochlorothiazide (HYDRODIURIL) 25 MG tablet TAKE 1 TABLET BY MOUTH EVERY DAY (MAX ON INS) 90 tablet 1  . levothyroxine (SYNTHROID, LEVOTHROID) 88 MCG tablet TAKE 1 TABLET (88 MCG TOTAL) BY MOUTH DAILY BEFORE BREAKFAST. 30 tablet 11  . Multiple Vitamin (MULTIVITAMIN) tablet Take 1 tablet by mouth daily.      [name] venlafaxine XR (EFFEXOR-XR) 150 MG 24 hr capsule TAKE ONE CAPSULE BY MOUTH EVERY DAY 90 capsule 1   No current facility-administered medications on file prior to visit.     Objective:  Objective Physical Exam  Constitutional: She is oriented to person, place, and time. She appears well-developed and well-nourished.  HENT:  Head: Normocephalic and atraumatic.  Eyes: Conjunctivae and EOM are normal.  Neck: Normal range of motion. Neck supple. No JVD present. Carotid bruit is not present. No thyromegaly present.  Cardiovascular: Normal rate, regular rhythm and normal heart sounds.   No murmur heard. Pulmonary/Chest: Effort normal and breath sounds normal. No respiratory distress. She has no wheezes. She has no rales. She exhibits no tenderness.  Genitourinary:    There is erythema in the vagina. No tenderness or bleeding in the vagina. Vaginal discharge found.  Musculoskeletal: She exhibits no  edema.  Neurological: She is alert and oriented to person, place, and time.  Psychiatric: She has a normal mood and affect.  Nursing note and vitals reviewed. wet prep sent BP 118/74 mmHg  Pulse 77  Temp(Src) 98.5 F (36.9 C) (Oral)  Wt 278 lb (126.1 kg)  SpO2 98% Wt Readings from Last 3 Encounters:  04/14/15 278 lb (126.1 kg)    12/17/14 284 lb 3.2 oz (128.912 kg)  11/19/14 278 lb 3.2 oz (126.191 kg)     Lab Results  Component Value Date   WBC 7.2 08/09/2014   HGB 13.8 08/09/2014   HCT 41.2 08/09/2014   PLT 292.0 08/09/2014   GLUCOSE 106* 08/09/2014   CHOL 139 08/09/2014   TRIG 98.0 08/09/2014   HDL 45.30 08/09/2014   LDLDIRECT 193.3 11/06/2012   LDLCALC 74 08/09/2014   ALT 22 08/09/2014   AST 30 08/09/2014   NA 142 08/09/2014   K 4.5 08/09/2014   CL 103 08/09/2014   CREATININE 0.92 08/09/2014   BUN 15 08/09/2014   CO2 32 08/09/2014   TSH 3.44 08/09/2014   HGBA1C 6.2 01/07/2014    No results found.   Assessment & Plan:  Plan I have discontinued Ms. Neece's fluconazole, metroNIDAZOLE, and metroNIDAZOLE. I am also having her start on metroNIDAZOLE and doxycycline. Additionally, I am having her maintain her Calcium Carbonate-Vitamin D (CALCIUM 500 + D PO), multivitamin, ANTARA, cyclobenzaprine, levothyroxine, hydrochlorothiazide, venlafaxine XR, and atorvastatin.  Meds ordered this encounter  Medications  . metroNIDAZOLE (FLAGYL) 500 MG tablet    Sig: Take 1 tablet (500 mg total) by mouth 2 (two) times daily.    Dispense:  14 tablet    Refill:  0  . doxycycline (VIBRA-TABS) 100 MG tablet    Sig: Take 1 tablet (100 mg total) by mouth 2 (two) times daily.    Dispense:  20 tablet    Refill:  0    Problem List Items Addressed This Visit      Unprioritized   Abscess of skin    Warm compress or soak in tub with epson salt  abx per orders Call if no relief       Other Visit Diagnoses    Vaginal discharge    -  Primary    Relevant Orders    Cervicovaginal ancillary only    Bacterial vaginosis        Relevant Medications    metroNIDAZOLE (FLAGYL) 500 MG tablet    Abscess of groin        Relevant Medications    doxycycline (VIBRA-TABS) 100 MG tablet       Follow-up: Return if symptoms worsen or fail to improve.  Garnet Koyanagi, DO

## 2015-04-18 LAB — CERVICOVAGINAL ANCILLARY ONLY: WET PREP (BD AFFIRM): POSITIVE — AB

## 2015-05-06 ENCOUNTER — Other Ambulatory Visit: Payer: Self-pay | Admitting: Family Medicine

## 2015-06-03 ENCOUNTER — Other Ambulatory Visit: Payer: Self-pay | Admitting: Family Medicine

## 2015-07-01 ENCOUNTER — Ambulatory Visit: Payer: Commercial Managed Care - PPO | Admitting: Internal Medicine

## 2015-07-12 ENCOUNTER — Encounter: Payer: Self-pay | Admitting: Internal Medicine

## 2015-07-12 ENCOUNTER — Ambulatory Visit: Payer: Commercial Managed Care - PPO | Admitting: Internal Medicine

## 2015-07-12 VITALS — BP 140/78 | HR 67 | Ht 66.0 in | Wt 279.2 lb

## 2015-07-12 DIAGNOSIS — G4733 Obstructive sleep apnea (adult) (pediatric): Secondary | ICD-10-CM

## 2015-07-12 NOTE — Patient Instructions (Signed)
Order- [facility]    Increase CPAP pressure to 10 cwp    Continue mask of choice, humidifier, supplies, AirView    Dx OSA  Please call as needed

## 2015-07-12 NOTE — Progress Notes (Signed)
09/29/13- 72 yoF Former smoker followed for OSA complicated by  obesity, hypothyroid,  Pt last seen on 08/10/2010. Pt would like to re-establish care for new problem of cough. CPAP therapy continues to do well With CPAP 11/ advanced, used all night every night. New problem Cough-denies prior lung disease. Onset of cough in January without recognized acute event or infection. She had thought she had a bronchitis at first but she did not respond to 3 prednisone tapers, Qvar, several rounds of antibiotics. She has had only clear mucus or dry cough with no fever significant postnasal drip or constitutional symptoms. Not acutely short of breath. No wheezing. Cough has been worse around bedtime and early in the morning. No recognized effect of location including her home or work place, indoors or out. Symptoms persisted from January through the spring and summer without much change and without a lot of difference remembered while she was on prednisone. She has some history of reflux , sleeps on a Sleep Number  bed with head slightly elevated, avoids eating after 6 PM and is taking Prilosec once daily. She does not choke or strangle easily while eating and has not been waking in the middle of the night with coughing or recognized reflux.  10/30/13- 61 yoF Former smoker followed for OSA, Cough, complicated by  obesity, hypothyroid, GERD CPAP 11/ [facility] FOLLOWS FOR: pt states she is still coughing, prod cough with clear to sometimes green mucus.  States her cough has improved some since stopping the prilosec BID.  Cough worse in morning.  Using Qvar 40, benzonatate. No significant reflux or postnasal drip noted. Cough prod white, no wheeze.  CXR 09/18/13 FINDINGS:  There is no edema or consolidation. The heart size and pulmonary  vascularity are normal. No adenopathy. No bone lesions.  IMPRESSION:  No edema or consolidation.  Electronically Signed  By: Lowella Grip M.D.  On: 09/18/2013  14:55  12/31/13- 49 yoF Former smoker followed for OSA, Cough, complicated by  obesity, hypothyroid, GERD CPAP 7/ [facility] FOLLOW UP: cough, pt was in the ER on 12/26/2013 for bronchopneumonia; CT/CXR done at Hospital.  coughing still but not as bad.  Apnea: received card for download showing good compliance but inadequate control onset pressure of 7. Our records had indicated pressure of 11 which apparently is incorrect. On Levaquin 14 days for bronchopneumonia after ER visit. Feeling much better. Has had flu vaccine . We discussed resumption Breo  CT chest 12/26/13 IMPRESSION: 1. Peribronchial interstitial thickening in both lower lobes, left greater than right, and minimally in the left upper lobe lingula. There is associated patchy airspace opacity in the lower lobe,, left greater than right, which may be atelectasis, infiltrate or a combination. Findings are consistent with bibasal bronchitis, possibly bronchopneumonia. iElectronically Signed  By: Lajean Manes M.D.  On: 12/26/2013 17:30  07/01/14- 66 yoF Former smoker followed for OSA, Cough, complicated by  obesity, hypothyroid, GERD CPAP 7/ [facility] Follows For: Pt states breathing has improved. Uses CPAP 7-8 hours nightly , mask fits fine. No new complaints.  "Life much better" on CPAP. Bronchopneumonia resolved on f/u CXR. CXR 01/21/14- image reviewed IMPRESSION: 1. Significant improvement in the appearance the chest with complete resolution of airspace disease in the lung bases bilaterally, with minimal residual atelectasis or scarring in the left lower lobe. Electronically Signed  By: Vinnie Langton M.D.  On: 01/21/2014 14:21  07/11/2015-65 year old female former smoker followed for OSA, Cough, complicated by obesity, hypothyroid, GERD CPAP 9/[facility]  FOLLOWS FOR: DME: [facility]. Pt  states she wears CPAP every night; DL attached.       ROS-see HPI Constitutional:   No-   weight loss, night sweats, fevers, chills,  fatigue, lassitude. HEENT:   No-  headaches, difficulty swallowing, tooth/dental problems, sore throat,       No-  sneezing, itching, ear ache, nasal congestion, post nasal drip,  CV:  No-   chest pain, orthopnea, PND, swelling in lower extremities, anasarca,  dizziness, palpitations Resp: No-   shortness of breath with exertion or at rest.               productive cough,   non-productive cough,  No- coughing up of blood.              No-   change in color of mucus.  No- wheezing.   Skin: No-   rash or lesions. GI:  No-   heartburn, indigestion, abdominal pain, nausea, vomiting, GU: . MS:  No-   joint pain or swelling.   Neuro-     nothing unusual Psych:  No- change in mood or affect. No depression or anxiety.  No memory loss.  OBJ- Physical Exam General- Alert, Oriented, Affect-appropriate, Distress- none acute, obese Skin- rash-none, lesions- none, excoriation- none Lymphadenopathy- none Head- atraumatic            Eyes- Gross vision intact, PERRLA, conjunctivae and secretions clear            Ears- Hearing, canals-normal            Nose- Clear, no-Septal dev, mucus, polyps, erosion, perforation             Throat- Mallampati III-IV , mucosa clear , drainage- none, tonsils- atrophic Neck- flexible , trachea midline, no stridor , thyroid nl, carotid no bruit Chest - symmetrical excursion , unlabored           Heart/CV- RRR , no murmur , no gallop  , no rub, nl s1 s2                           - JVD- none , edema- none, stasis changes- none, varices- none           Lung- + mild bilateral rhonchi upper zones, wheeze- none, cough+harsh , dullness-none, rub- none           Chest wall-  Abd-  Br/ Gen/ Rectal- Not done, not indicated Extrem- cyanosis- none, clubbing, none, atrophy- none, strength- nl Neuro- grossly intact to observation   .

## 2015-08-13 ENCOUNTER — Other Ambulatory Visit: Payer: Self-pay | Admitting: Family Medicine

## 2015-08-20 ENCOUNTER — Other Ambulatory Visit: Payer: Self-pay | Admitting: Family Medicine

## 2015-08-30 ENCOUNTER — Encounter: Payer: Self-pay | Admitting: Family Medicine

## 2015-08-30 NOTE — Telephone Encounter (Signed)
If only in chest -- she can use mucinex or mucinex dm If in head add flonase ( or other nose spray ) and antihistamine (zyrtec, claritin , etc) If she thinks an antibiotic is needed she would need to be seen

## 2015-09-07 ENCOUNTER — Other Ambulatory Visit: Payer: Self-pay | Admitting: Family Medicine

## 2015-09-07 DIAGNOSIS — Z1231 Encounter for screening mammogram for malignant neoplasm of breast: Secondary | ICD-10-CM

## 2015-09-12 ENCOUNTER — Ambulatory Visit (HOSPITAL_BASED_OUTPATIENT_CLINIC_OR_DEPARTMENT_OTHER)
Admission: RE | Admit: 2015-09-12 | Discharge: 2015-09-12 | Disposition: A | Discharge status: Home / Self Care | Payer: Commercial Managed Care - PPO | Source: Ambulatory Visit | Attending: Family Medicine | Admitting: Family Medicine

## 2015-09-12 ENCOUNTER — Ambulatory Visit (INDEPENDENT_AMBULATORY_CARE_PROVIDER_SITE_OTHER): Payer: Commercial Managed Care - PPO | Admitting: Family Medicine

## 2015-09-12 ENCOUNTER — Encounter: Payer: Self-pay | Admitting: Family Medicine

## 2015-09-12 VITALS — BP 128/80 | HR 83 | Temp 98.1°F | Ht 64.75 in | Wt 277.0 lb

## 2015-09-12 DIAGNOSIS — Z0000 Encounter for general adult medical examination without abnormal findings: Secondary | ICD-10-CM

## 2015-09-12 DIAGNOSIS — N390 Urinary tract infection, site not specified: Secondary | ICD-10-CM

## 2015-09-12 DIAGNOSIS — R82998 Other abnormal findings in urine: Secondary | ICD-10-CM

## 2015-09-12 DIAGNOSIS — E039 Hypothyroidism, unspecified: Secondary | ICD-10-CM | POA: Diagnosis not present

## 2015-09-12 DIAGNOSIS — Z1231 Encounter for screening mammogram for malignant neoplasm of breast: Secondary | ICD-10-CM | POA: Diagnosis present

## 2015-09-12 DIAGNOSIS — F419 Anxiety disorder, unspecified: Secondary | ICD-10-CM

## 2015-09-12 DIAGNOSIS — E785 Hyperlipidemia, unspecified: Secondary | ICD-10-CM | POA: Diagnosis not present

## 2015-09-12 DIAGNOSIS — I10 Essential (primary) hypertension: Secondary | ICD-10-CM | POA: Diagnosis not present

## 2015-09-12 LAB — POCT URINALYSIS DIPSTICK
BILIRUBIN UA: NEGATIVE
Blood, UA: NEGATIVE
Glucose, UA: NEGATIVE
KETONES UA: NEGATIVE
Nitrite, UA: NEGATIVE
PH UA: 6
Protein, UA: NEGATIVE
Urobilinogen, UA: 0.2

## 2015-09-12 MED ORDER — FENOFIBRATE MICRONIZED 90 MG PO CAPS: ORAL_CAPSULE | ORAL | 0 refills | Status: DC

## 2015-09-12 MED ORDER — ATORVASTATIN CALCIUM 20 MG PO TABS: 20.0000 mg | ORAL_TABLET | Freq: Every day | ORAL | 0 refills | Status: DC

## 2015-09-12 MED ORDER — VENLAFAXINE HCL ER 150 MG PO CP24: 150.0000 mg | ORAL_CAPSULE | Freq: Every day | ORAL | 1 refills | Status: DC

## 2015-09-12 MED ORDER — LEVOTHYROXINE SODIUM 88 MCG PO TABS: ORAL_TABLET | ORAL | 11 refills | Status: DC

## 2015-09-12 MED ORDER — HYDROCHLOROTHIAZIDE 25 MG PO TABS: ORAL_TABLET | ORAL | 1 refills | Status: DC

## 2015-09-12 NOTE — Progress Notes (Signed)
Subjective:     Amy Mahoney is a 65 y.o. female and is here for a comprehensive physical exam. The patient reports no problems.  Social History   Social History  . Marital status: Single    Spouse name: N/A  . Number of children: N/A  . Years of education: N/A   Occupational History  . [employer]--controller   .  [employer]   Social History Main Topics  . Smoking status: Former Smoker    Packs/day: 0.50    Years: 3.00    Types: Cigarettes    Quit date: 08/10/1990  . Smokeless tobacco: Never Used  . Alcohol use Yes     Comment: rarely  . Drug use: No  . Sexual activity: Not Currently    Partners: Male   Other Topics Concern  . Not on file   Social History Narrative   Regular exercise - yes-- 10,000 steps   Caffeine use: 2 cups of coffee daily   Health Maintenance  Topic Date Due  . MAMMOGRAM  08/13/2015  . HIV Screening  09/11/2016 (Originally 10/20/1965)  . INFLUENZA VACCINE  09/20/2015  . PAP SMEAR  08/08/2017  . COLONOSCOPY  01/01/2018  . PNA vac Low Risk Adult (2 of 2 - PPSV23) 10/31/2018  . TETANUS/TDAP  07/31/2020  . DEXA SCAN  Completed  . ZOSTAVAX  Completed  . Hepatitis C Screening  Completed    The following portions of the patient's history were reviewed and updated as appropriate: She  has a past medical history of Depression; Sleep apnea; Spider bite; and Thyroid disease. She  does not have any pertinent problems on file. She  has a past surgical history that includes Cholecystectomy; Dilation and curettage of uterus; Tonsillectomy; and fibroids scrapping. Her family history includes Breast cancer in her maternal aunt and paternal grandmother; Cancer in her maternal aunt and maternal aunt; Diabetes in her maternal aunt and mother; Lung cancer in her maternal aunt. She  reports that she quit smoking about 25 years ago. Her smoking use included Cigarettes. She has a 1.50 pack-year smoking history. She has never used smokeless  tobacco. She reports that she drinks alcohol. She reports that she does not use drugs. She has a current medication list which includes the following prescription(s): atorvastatin, calcium carbonate-vitamin d, fenofibrate micronized, hydrochlorothiazide, levothyroxine, multivitamin, and venlafaxine xr. Current Outpatient Prescriptions on File Prior to Visit  Medication Sig Dispense Refill  . Calcium Carbonate-Vitamin D (CALCIUM 500 + D PO) Take 1 tablet by mouth 2 (two) times daily.      . Multiple Vitamin (MULTIVITAMIN) tablet Take 1 tablet by mouth daily.       No current facility-administered medications on file prior to visit.    She has No Known Allergies..  Review of Systems Review of Systems  Constitutional: Negative for activity change, appetite change and fatigue.  HENT: Negative for hearing loss, congestion, tinnitus and ear discharge.  dentist q75m Eyes: Negative for visual disturbance (see optho q1y -- vision corrected to 20/20 with glasses).  Respiratory: Negative for cough, chest tightness and shortness of breath.   Cardiovascular: Negative for chest pain, palpitations and leg swelling.  Gastrointestinal: Negative for abdominal pain, diarrhea, constipation and abdominal distention.  Genitourinary: Negative for urgency, frequency, decreased urine volume and difficulty urinating.  Musculoskeletal: Negative for back pain, arthralgias and gait problem.  Skin: Negative for color change, pallor and rash.  Neurological: Negative for dizziness, light-headedness, numbness and headaches.  Hematological: Negative for adenopathy. Does not bruise/bleed easily.  Psychiatric/Behavioral: Negative for suicidal ideas, confusion, sleep disturbance, self-injury, dysphoric mood, decreased concentration and agitation.       Objective:    BP 128/80 (BP Location: Left Arm, Patient Position: Sitting, Cuff Size: Large)   Pulse 83   Temp 98.1 F (36.7 C) (Oral)   Ht 5' 4.75" (1.645 m)   Wt 277  lb (125.6 kg)   SpO2 97%   BMI 46.45 kg/m  General appearance: alert, cooperative, appears stated age and no distress Head: Normocephalic, without obvious abnormality, atraumatic Eyes: conjunctivae/corneas clear. PERRL, EOM's intact. Fundi benign. Ears: normal TM's and external ear canals both ears Nose: Nares normal. Septum midline. Mucosa normal. No drainage or sinus tenderness. Throat: lips, mucosa, and tongue normal; teeth and gums normal Neck: no adenopathy, no carotid bruit, no JVD, supple, symmetrical, trachea midline and thyroid not enlarged, symmetric, no tenderness/mass/nodules Back: symmetric, no curvature. ROM normal. No CVA tenderness. Lungs: clear to auscultation bilaterally Breasts: normal appearance, no masses or tenderness Heart: regular rate and rhythm, S1, S2 normal, no murmur, click, rub or gallop Abdomen: soft, non-tender; bowel sounds normal; no masses,  no organomegaly Pelvic: deferred Extremities: extremities normal, atraumatic, no cyanosis or edema Pulses: 2+ and symmetric Skin: Skin color, texture, turgor normal. No rashes or lesions Lymph nodes: Cervical, supraclavicular, and axillary nodes normal. Neurologic: Alert and oriented X 3, normal strength and tone. Normal symmetric reflexes. Normal coordination and gait    Assessment:    Healthy female exam.      Plan:    ghm utd Check labs See After Visit Summary for Counseling Recommendations    1. Hypothyroidism, adult Check labs - TSH - TSH - levothyroxine (SYNTHROID, LEVOTHROID) 88 MCG tablet; TAKE 1 TABLET (88 MCG TOTAL) BY MOUTH DAILY BEFORE BREAKFAST.  Dispense: 30 tablet; Refill: 11  2. Essential hypertension stable - Comprehensive metabolic panel - CBC with Differential/Platelet - Lipid panel - POCT urinalysis dipstick - Microalbumin / creatinine urine ratio - TSH - CBC - Comprehensive metabolic panel - hydrochlorothiazide (HYDRODIURIL) 25 MG tablet; TAKE 1 TABLET BY MOUTH EVERY DAY (MAX  ON INS)  Dispense: 90 tablet; Refill: 1  3. Hyperlipidemia Check labs - Comprehensive metabolic panel - Lipid panel - Lipid panel - CBC - Comprehensive metabolic panel - Fenofibrate Micronized (ANTARA) 90 MG CAPS; TAKE 1 CAPSULE BY MOUTH DAILY. REPEAT LABS ARE DUE NOW  Dispense: 90 capsule; Refill: 0 - atorvastatin (LIPITOR) 20 MG tablet; Take 1 tablet (20 mg total) by mouth daily. Repeat labs are due now  Dispense: 90 tablet; Refill: 0  4. Preventative health care See above  5. Anxiety disorder, unspecified anxiety disorder type   - venlafaxine XR (EFFEXOR-XR) 150 MG 24 hr capsule; Take 1 capsule (150 mg total) by mouth daily.  Dispense: 90 capsule; Refill: 1

## 2015-09-12 NOTE — Patient Instructions (Signed)
Preventive Care for Adults, Female A healthy lifestyle and preventive care can promote health and wellness. Preventive health guidelines for women include the following key practices.  A routine yearly physical is a good way to check with your health care provider about your health and preventive screening. It is a chance to share any concerns and updates on your health and to receive a thorough exam.  Visit your dentist for a routine exam and preventive care every 6 months. Brush your teeth twice a day and floss once a day. Good oral hygiene prevents tooth decay and gum disease.  The frequency of eye exams is based on your age, health, family medical history, use of contact lenses, and other factors. Follow your health care provider's recommendations for frequency of eye exams.  Eat a healthy diet. Foods like vegetables, fruits, whole grains, low-fat dairy products, and lean protein foods contain the nutrients you need without too many calories. Decrease your intake of foods high in solid fats, added sugars, and salt. Eat the right amount of calories for you.Get information about a proper diet from your health care provider, if necessary.  Regular physical exercise is one of the most important things you can do for your health. Most adults should get at least 150 minutes of moderate-intensity exercise (any activity that increases your heart rate and causes you to sweat) each week. In addition, most adults need muscle-strengthening exercises on 2 or more days a week.  Maintain a healthy weight. The body mass index (BMI) is a screening tool to identify possible weight problems. It provides an estimate of body fat based on height and weight. Your health care provider can find your BMI and can help you achieve or maintain a healthy weight.For adults 20 years and older:  A BMI below 18.5 is considered underweight.  A BMI of 18.5 to 24.9 is normal.  A BMI of 25 to 29.9 is considered overweight.  A  BMI of 30 and above is considered obese.  Maintain normal blood lipids and cholesterol levels by exercising and minimizing your intake of saturated fat. Eat a balanced diet with plenty of fruit and vegetables. Blood tests for lipids and cholesterol should begin at age 45 and be repeated every 5 years. If your lipid or cholesterol levels are high, you are over 50, or you are at high risk for heart disease, you may need your cholesterol levels checked more frequently.Ongoing high lipid and cholesterol levels should be treated with medicines if diet and exercise are not working.  If you smoke, find out from your health care provider how to quit. If you do not use tobacco, do not start.  Lung cancer screening is recommended for adults aged 45-80 years who are at high risk for developing lung cancer because of a history of smoking. A yearly low-dose CT scan of the lungs is recommended for people who have at least a 30-pack-year history of smoking and are a current smoker or have quit within the past 15 years. A pack year of smoking is smoking an average of 1 pack of cigarettes a day for 1 year (for example: 1 pack a day for 30 years or 2 packs a day for 15 years). Yearly screening should continue until the smoker has stopped smoking for at least 15 years. Yearly screening should be stopped for people who develop a health problem that would prevent them from having lung cancer treatment.  If you are pregnant, do not drink alcohol. If you are  breastfeeding, be very cautious about drinking alcohol. If you are not pregnant and choose to drink alcohol, do not have more than 1 drink per day. One drink is considered to be 12 ounces (355 mL) of beer, 5 ounces (148 mL) of wine, or 1.5 ounces (44 mL) of liquor.  Avoid use of street drugs. Do not share needles with anyone. Ask for help if you need support or instructions about stopping the use of drugs.  High blood pressure causes heart disease and increases the risk  of stroke. Your blood pressure should be checked at least every 1 to 2 years. Ongoing high blood pressure should be treated with medicines if weight loss and exercise do not work.  If you are 55-79 years old, ask your health care provider if you should take aspirin to prevent strokes.  Diabetes screening is done by taking a blood sample to check your blood glucose level after you have not eaten for a certain period of time (fasting). If you are not overweight and you do not have risk factors for diabetes, you should be screened once every 3 years starting at age 45. If you are overweight or obese and you are 40-70 years of age, you should be screened for diabetes every year as part of your cardiovascular risk assessment.  Breast cancer screening is essential preventive care for women. You should practice "breast self-awareness." This means understanding the normal appearance and feel of your breasts and may include breast self-examination. Any changes detected, no matter how small, should be reported to a health care provider. Women in their 20s and 30s should have a clinical breast exam (CBE) by a health care provider as part of a regular health exam every 1 to 3 years. After age 40, women should have a CBE every year. Starting at age 40, women should consider having a mammogram (breast X-ray test) every year. Women who have a family history of breast cancer should talk to their health care provider about genetic screening. Women at a high risk of breast cancer should talk to their health care providers about having an MRI and a mammogram every year.  Breast cancer gene (BRCA)-related cancer risk assessment is recommended for women who have family members with BRCA-related cancers. BRCA-related cancers include breast, ovarian, tubal, and peritoneal cancers. Having family members with these cancers may be associated with an increased risk for harmful changes (mutations) in the breast cancer genes BRCA1 and  BRCA2. Results of the assessment will determine the need for genetic counseling and BRCA1 and BRCA2 testing.  Your health care provider may recommend that you be screened regularly for cancer of the pelvic organs (ovaries, uterus, and vagina). This screening involves a pelvic examination, including checking for microscopic changes to the surface of your cervix (Pap test). You may be encouraged to have this screening done every 3 years, beginning at age 21.  For women ages 30-65, health care providers may recommend pelvic exams and Pap testing every 3 years, or they may recommend the Pap and pelvic exam, combined with testing for human papilloma virus (HPV), every 5 years. Some types of HPV increase your risk of cervical cancer. Testing for HPV may also be done on women of any age with unclear Pap test results.  Other health care providers may not recommend any screening for nonpregnant women who are considered low risk for pelvic cancer and who do not have symptoms. Ask your health care provider if a screening pelvic exam is right for   you.  If you have had past treatment for cervical cancer or a condition that could lead to cancer, you need Pap tests and screening for cancer for at least 20 years after your treatment. If Pap tests have been discontinued, your risk factors (such as having a new sexual partner) need to be reassessed to determine if screening should resume. Some women have medical problems that increase the chance of getting cervical cancer. In these cases, your health care provider may recommend more frequent screening and Pap tests.  Colorectal cancer can be detected and often prevented. Most routine colorectal cancer screening begins at the age of 50 years and continues through age 75 years. However, your health care provider may recommend screening at an earlier age if you have risk factors for colon cancer. On a yearly basis, your health care provider may provide home test kits to check  for hidden blood in the stool. Use of a small camera at the end of a tube, to directly examine the colon (sigmoidoscopy or colonoscopy), can detect the earliest forms of colorectal cancer. Talk to your health care provider about this at age 50, when routine screening begins. Direct exam of the colon should be repeated every 5-10 years through age 75 years, unless early forms of precancerous polyps or small growths are found.  People who are at an increased risk for hepatitis B should be screened for this virus. You are considered at high risk for hepatitis B if:  You were born in a country where hepatitis B occurs often. Talk with your health care provider about which countries are considered high risk.  Your parents were born in a high-risk country and you have not received a shot to protect against hepatitis B (hepatitis B vaccine).  You have HIV or AIDS.  You use needles to inject street drugs.  You live with, or have sex with, someone who has hepatitis B.  You get hemodialysis treatment.  You take certain medicines for conditions like cancer, organ transplantation, and autoimmune conditions.  Hepatitis C blood testing is recommended for all people born from 1945 through 1965 and any individual with known risks for hepatitis C.  Practice safe sex. Use condoms and avoid high-risk sexual practices to reduce the spread of sexually transmitted infections (STIs). STIs include gonorrhea, chlamydia, syphilis, trichomonas, herpes, HPV, and human immunodeficiency virus (HIV). Herpes, HIV, and HPV are viral illnesses that have no cure. They can result in disability, cancer, and death.  You should be screened for sexually transmitted illnesses (STIs) including gonorrhea and chlamydia if:  You are sexually active and are younger than 24 years.  You are older than 24 years and your health care provider tells you that you are at risk for this type of infection.  Your sexual activity has changed  since you were last screened and you are at an increased risk for chlamydia or gonorrhea. Ask your health care provider if you are at risk.  If you are at risk of being infected with HIV, it is recommended that you take a prescription medicine daily to prevent HIV infection. This is called preexposure prophylaxis (PrEP). You are considered at risk if:  You are sexually active and do not regularly use condoms or know the HIV status of your partner(s).  You take drugs by injection.  You are sexually active with a partner who has HIV.  Talk with your health care provider about whether you are at high risk of being infected with HIV. If   you choose to begin PrEP, you should first be tested for HIV. You should then be tested every 3 months for as long as you are taking PrEP.  Osteoporosis is a disease in which the bones lose minerals and strength with aging. This can result in serious bone fractures or breaks. The risk of osteoporosis can be identified using a bone density scan. Women ages 67 years and over and women at risk for fractures or osteoporosis should discuss screening with their health care providers. Ask your health care provider whether you should take a calcium supplement or vitamin D to reduce the rate of osteoporosis.  Menopause can be associated with physical symptoms and risks. Hormone replacement therapy is available to decrease symptoms and risks. You should talk to your health care provider about whether hormone replacement therapy is right for you.  Use sunscreen. Apply sunscreen liberally and repeatedly throughout the day. You should seek shade when your shadow is shorter than you. Protect yourself by wearing long sleeves, pants, a wide-brimmed hat, and sunglasses year round, whenever you are outdoors.  Once a month, do a whole body skin exam, using a mirror to look at the skin on your back. Tell your health care provider of new moles, moles that have irregular borders, moles that  are larger than a pencil eraser, or moles that have changed in shape or color.  Stay current with required vaccines (immunizations).  Influenza vaccine. All adults should be immunized every year.  Tetanus, diphtheria, and acellular pertussis (Td, Tdap) vaccine. Pregnant women should receive 1 dose of Tdap vaccine during each pregnancy. The dose should be obtained regardless of the length of time since the last dose. Immunization is preferred during the 27th-36th week of gestation. An adult who has not previously received Tdap or who does not know her vaccine status should receive 1 dose of Tdap. This initial dose should be followed by tetanus and diphtheria toxoids (Td) booster doses every 10 years. Adults with an unknown or incomplete history of completing a 3-dose immunization series with Td-containing vaccines should begin or complete a primary immunization series including a Tdap dose. Adults should receive a Td booster every 10 years.  Varicella vaccine. An adult without evidence of immunity to varicella should receive 2 doses or a second dose if she has previously received 1 dose. Pregnant females who do not have evidence of immunity should receive the first dose after pregnancy. This first dose should be obtained before leaving the health care facility. The second dose should be obtained 4-8 weeks after the first dose.  Human papillomavirus (HPV) vaccine. Females aged 13-26 years who have not received the vaccine previously should obtain the 3-dose series. The vaccine is not recommended for use in pregnant females. However, pregnancy testing is not needed before receiving a dose. If a female is found to be pregnant after receiving a dose, no treatment is needed. In that case, the remaining doses should be delayed until after the pregnancy. Immunization is recommended for any person with an immunocompromised condition through the age of 61 years if she did not get any or all doses earlier. During the  3-dose series, the second dose should be obtained 4-8 weeks after the first dose. The third dose should be obtained 24 weeks after the first dose and 16 weeks after the second dose.  Zoster vaccine. One dose is recommended for adults aged 30 years or older unless certain conditions are present.  Measles, mumps, and rubella (MMR) vaccine. Adults born  before 1957 generally are considered immune to measles and mumps. Adults born in 1957 or later should have 1 or more doses of MMR vaccine unless there is a contraindication to the vaccine or there is laboratory evidence of immunity to each of the three diseases. A routine second dose of MMR vaccine should be obtained at least 28 days after the first dose for students attending postsecondary schools, health care workers, or international travelers. People who received inactivated measles vaccine or an unknown type of measles vaccine during 1963-1967 should receive 2 doses of MMR vaccine. People who received inactivated mumps vaccine or an unknown type of mumps vaccine before 1979 and are at high risk for mumps infection should consider immunization with 2 doses of MMR vaccine. For females of childbearing age, rubella immunity should be determined. If there is no evidence of immunity, females who are not pregnant should be vaccinated. If there is no evidence of immunity, females who are pregnant should delay immunization until after pregnancy. Unvaccinated health care workers born before 1957 who lack laboratory evidence of measles, mumps, or rubella immunity or laboratory confirmation of disease should consider measles and mumps immunization with 2 doses of MMR vaccine or rubella immunization with 1 dose of MMR vaccine.  Pneumococcal 13-valent conjugate (PCV13) vaccine. When indicated, a person who is uncertain of his immunization history and has no record of immunization should receive the PCV13 vaccine. All adults 65 years of age and older should receive this  vaccine. An adult aged 19 years or older who has certain medical conditions and has not been previously immunized should receive 1 dose of PCV13 vaccine. This PCV13 should be followed with a dose of pneumococcal polysaccharide (PPSV23) vaccine. Adults who are at high risk for pneumococcal disease should obtain the PPSV23 vaccine at least 8 weeks after the dose of PCV13 vaccine. Adults older than 65 years of age who have normal immune system function should obtain the PPSV23 vaccine dose at least 1 year after the dose of PCV13 vaccine.  Pneumococcal polysaccharide (PPSV23) vaccine. When PCV13 is also indicated, PCV13 should be obtained first. All adults aged 65 years and older should be immunized. An adult younger than age 65 years who has certain medical conditions should be immunized. Any person who resides in a nursing home or long-term care facility should be immunized. An adult smoker should be immunized. People with an immunocompromised condition and certain other conditions should receive both PCV13 and PPSV23 vaccines. People with human immunodeficiency virus (HIV) infection should be immunized as soon as possible after diagnosis. Immunization during chemotherapy or radiation therapy should be avoided. Routine use of PPSV23 vaccine is not recommended for American Indians, [place] Natives, or people younger than 65 years unless there are medical conditions that require PPSV23 vaccine. When indicated, people who have unknown immunization and have no record of immunization should receive PPSV23 vaccine. One-time revaccination 5 years after the first dose of PPSV23 is recommended for people aged 19-64 years who have chronic kidney failure, nephrotic syndrome, asplenia, or immunocompromised conditions. People who received 1-2 doses of PPSV23 before age 65 years should receive another dose of PPSV23 vaccine at age 65 years or later if at least 5 years have passed since the previous dose. Doses of PPSV23 are not  needed for people immunized with PPSV23 at or after age 65 years.  Meningococcal vaccine. Adults with asplenia or persistent complement component deficiencies should receive 2 doses of quadrivalent meningococcal conjugate (MenACWY-D) vaccine. The doses should be obtained   at least 2 months apart. Microbiologists working with certain meningococcal bacteria, [place] recruits, people at risk during an outbreak, and people who travel to or live in countries with a high rate of meningitis should be immunized. A first-year college student up through age 34 years who is living in a residence hall should receive a dose if she did not receive a dose on or after her 16th birthday. Adults who have certain high-risk conditions should receive one or more doses of vaccine.  Hepatitis A vaccine. Adults who wish to be protected from this disease, have certain high-risk conditions, work with hepatitis A-infected animals, work in hepatitis A research labs, or travel to or work in countries with a high rate of hepatitis A should be immunized. Adults who were previously unvaccinated and who anticipate close contact with an international adoptee during the first 60 days after arrival in the [place] from a country with a high rate of hepatitis A should be immunized.  Hepatitis B vaccine. Adults who wish to be protected from this disease, have certain high-risk conditions, may be exposed to blood or other infectious body fluids, are household contacts or sex partners of hepatitis B positive people, are clients or workers in certain care facilities, or travel to or work in countries with a high rate of hepatitis B should be immunized.  Haemophilus influenzae type b (Hib) vaccine. A previously unvaccinated person with asplenia or sickle cell disease or having a scheduled splenectomy should receive 1 dose of Hib vaccine. Regardless of previous immunization, a recipient of a hematopoietic stem cell transplant should receive a  3-dose series 6-12 months after her successful transplant. Hib vaccine is not recommended for adults with HIV infection. Preventive Services / Frequency Ages 35 to 4 years  Blood pressure check.** / Every 3-5 years.  Lipid and cholesterol check.** / Every 5 years beginning at age 60.  Clinical breast exam.** / Every 3 years for women in their 71s and 10s.  BRCA-related cancer risk assessment.** / For women who have family members with a BRCA-related cancer (breast, ovarian, tubal, or peritoneal cancers).  Pap test.** / Every 2 years from ages 76 through 26. Every 3 years starting at age 61 through age 76 or 93 with a history of 3 consecutive normal Pap tests.  HPV screening.** / Every 3 years from ages 37 through ages 60 to 51 with a history of 3 consecutive normal Pap tests.  Hepatitis C blood test.** / For any individual with known risks for hepatitis C.  Skin self-exam. / Monthly.  Influenza vaccine. / Every year.  Tetanus, diphtheria, and acellular pertussis (Tdap, Td) vaccine.** / Consult your health care provider. Pregnant women should receive 1 dose of Tdap vaccine during each pregnancy. 1 dose of Td every 10 years.  Varicella vaccine.** / Consult your health care provider. Pregnant females who do not have evidence of immunity should receive the first dose after pregnancy.  HPV vaccine. / 3 doses over 6 months, if 93 and younger. The vaccine is not recommended for use in pregnant females. However, pregnancy testing is not needed before receiving a dose.  Measles, mumps, rubella (MMR) vaccine.** / You need at least 1 dose of MMR if you were born in 1957 or later. You may also need a 2nd dose. For females of childbearing age, rubella immunity should be determined. If there is no evidence of immunity, females who are not pregnant should be vaccinated. If there is no evidence of immunity, females who are  pregnant should delay immunization until after pregnancy.  Pneumococcal  13-valent conjugate (PCV13) vaccine.** / Consult your health care provider.  Pneumococcal polysaccharide (PPSV23) vaccine.** / 1 to 2 doses if you smoke cigarettes or if you have certain conditions.  Meningococcal vaccine.** / 1 dose if you are age 68 to 8 years and a Market researcher living in a residence hall, or have one of several medical conditions, you need to get vaccinated against meningococcal disease. You may also need additional booster doses.  Hepatitis A vaccine.** / Consult your health care provider.  Hepatitis B vaccine.** / Consult your health care provider.  Haemophilus influenzae type b (Hib) vaccine.** / Consult your health care provider. Ages 7 to 53 years  Blood pressure check.** / Every year.  Lipid and cholesterol check.** / Every 5 years beginning at age 25 years.  Lung cancer screening. / Every year if you are aged 11-80 years and have a 30-pack-year history of smoking and currently smoke or have quit within the past 15 years. Yearly screening is stopped once you have quit smoking for at least 15 years or develop a health problem that would prevent you from having lung cancer treatment.  Clinical breast exam.** / Every year after age 48 years.  BRCA-related cancer risk assessment.** / For women who have family members with a BRCA-related cancer (breast, ovarian, tubal, or peritoneal cancers).  Mammogram.** / Every year beginning at age 41 years and continuing for as long as you are in good health. Consult with your health care provider.  Pap test.** / Every 3 years starting at age 65 years through age 37 or 70 years with a history of 3 consecutive normal Pap tests.  HPV screening.** / Every 3 years from ages 72 years through ages 60 to 40 years with a history of 3 consecutive normal Pap tests.  Fecal occult blood test (FOBT) of stool. / Every year beginning at age 21 years and continuing until age 5 years. You may not need to do this test if you get  a colonoscopy every 10 years.  Flexible sigmoidoscopy or colonoscopy.** / Every 5 years for a flexible sigmoidoscopy or every 10 years for a colonoscopy beginning at age 35 years and continuing until age 48 years.  Hepatitis C blood test.** / For all people born from [birth year] through 1965 and any individual with known risks for hepatitis C.  Skin self-exam. / Monthly.  Influenza vaccine. / Every year.  Tetanus, diphtheria, and acellular pertussis (Tdap/Td) vaccine.** / Consult your health care provider. Pregnant women should receive 1 dose of Tdap vaccine during each pregnancy. 1 dose of Td every 10 years.  Varicella vaccine.** / Consult your health care provider. Pregnant females who do not have evidence of immunity should receive the first dose after pregnancy.  Zoster vaccine.** / 1 dose for adults aged 30 years or older.  Measles, mumps, rubella (MMR) vaccine.** / You need at least 1 dose of MMR if you were born in 1957 or later. You may also need a second dose. For females of childbearing age, rubella immunity should be determined. If there is no evidence of immunity, females who are not pregnant should be vaccinated. If there is no evidence of immunity, females who are pregnant should delay immunization until after pregnancy.  Pneumococcal 13-valent conjugate (PCV13) vaccine.** / Consult your health care provider.  Pneumococcal polysaccharide (PPSV23) vaccine.** / 1 to 2 doses if you smoke cigarettes or if you have certain conditions.  Meningococcal vaccine.** /  Consult your health care provider.  Hepatitis A vaccine.** / Consult your health care provider.  Hepatitis B vaccine.** / Consult your health care provider.  Haemophilus influenzae type b (Hib) vaccine.** / Consult your health care provider. Ages 64 years and over  Blood pressure check.** / Every year.  Lipid and cholesterol check.** / Every 5 years beginning at age 23 years.  Lung cancer screening. / Every year if you  are aged 16-80 years and have a 30-pack-year history of smoking and currently smoke or have quit within the past 15 years. Yearly screening is stopped once you have quit smoking for at least 15 years or develop a health problem that would prevent you from having lung cancer treatment.  Clinical breast exam.** / Every year after age 74 years.  BRCA-related cancer risk assessment.** / For women who have family members with a BRCA-related cancer (breast, ovarian, tubal, or peritoneal cancers).  Mammogram.** / Every year beginning at age 44 years and continuing for as long as you are in good health. Consult with your health care provider.  Pap test.** / Every 3 years starting at age 58 years through age 22 or 39 years with 3 consecutive normal Pap tests. Testing can be stopped between 65 and 70 years with 3 consecutive normal Pap tests and no abnormal Pap or HPV tests in the past 10 years.  HPV screening.** / Every 3 years from ages 64 years through ages 70 or 61 years with a history of 3 consecutive normal Pap tests. Testing can be stopped between 65 and 70 years with 3 consecutive normal Pap tests and no abnormal Pap or HPV tests in the past 10 years.  Fecal occult blood test (FOBT) of stool. / Every year beginning at age 40 years and continuing until age 27 years. You may not need to do this test if you get a colonoscopy every 10 years.  Flexible sigmoidoscopy or colonoscopy.** / Every 5 years for a flexible sigmoidoscopy or every 10 years for a colonoscopy beginning at age 7 years and continuing until age 32 years.  Hepatitis C blood test.** / For all people born from [birth year] through 1966 and any individual with known risks for hepatitis C.  Osteoporosis screening.** / A one-time screening for women ages 30 years and over and women at risk for fractures or osteoporosis.  Skin self-exam. / Monthly.  Influenza vaccine. / Every year.  Tetanus, diphtheria, and acellular pertussis (Tdap/Td)  vaccine.** / 1 dose of Td every 10 years.  Varicella vaccine.** / Consult your health care provider.  Zoster vaccine.** / 1 dose for adults aged 35 years or older.  Pneumococcal 13-valent conjugate (PCV13) vaccine.** / Consult your health care provider.  Pneumococcal polysaccharide (PPSV23) vaccine.** / 1 dose for all adults aged 46 years and older.  Meningococcal vaccine.** / Consult your health care provider.  Hepatitis A vaccine.** / Consult your health care provider.  Hepatitis B vaccine.** / Consult your health care provider.  Haemophilus influenzae type b (Hib) vaccine.** / Consult your health care provider. ** Family history and personal history of risk and conditions may change your health care provider's recommendations.   This information is not intended to replace advice given to you by your health care provider. Make sure you discuss any questions you have with your health care provider.   Document Released: 04/03/2001 Document Revised: 02/26/2014 Document Reviewed: 07/03/2010 Elsevier Interactive Patient Education [employer].

## 2015-09-13 LAB — COMPREHENSIVE METABOLIC PANEL
ALK PHOS: 44 U/L (ref 39–117)
ALT: 16 U/L (ref 0–35)
AST: 23 U/L (ref 0–37)
Albumin: 4.2 g/dL (ref 3.5–5.2)
BILIRUBIN TOTAL: 0.4 mg/dL (ref 0.2–1.2)
BUN: 19 mg/dL (ref 6–23)
CALCIUM: 9.8 mg/dL (ref 8.4–10.5)
CO2: 30 mEq/L (ref 19–32)
Chloride: 104 mEq/L (ref 96–112)
Creatinine, Ser: 0.95 mg/dL (ref 0.40–1.20)
GFR: 62.57 mL/min (ref >60.00)
Glucose, Bld: 122 mg/dL — ABNORMAL HIGH (ref 70–99)
POTASSIUM: 3.9 meq/L (ref 3.5–5.1)
Sodium: 140 mEq/L (ref 135–145)
TOTAL PROTEIN: 7 g/dL (ref 6.0–8.3)

## 2015-09-13 LAB — CBC WITH DIFFERENTIAL/PLATELET
BASOS ABS: 0.1 10*3/uL (ref 0.0–0.1)
Basophils Relative: 1.2 % (ref 0.0–3.0)
EOS PCT: 6.1 % — AB (ref 0.0–5.0)
Eosinophils Absolute: 0.4 10*3/uL (ref 0.0–0.7)
HEMATOCRIT: 39.3 % (ref 36.0–46.0)
HEMOGLOBIN: 12.9 g/dL (ref 12.0–15.0)
LYMPHS PCT: 35.1 % (ref 12.0–46.0)
Lymphs Abs: 2.5 10*3/uL (ref 0.7–4.0)
MCHC: 32.8 g/dL (ref 30.0–36.0)
MCV: 88.9 fl (ref 78.0–100.0)
MONOS PCT: 6.3 % (ref 3.0–12.0)
Monocytes Absolute: 0.5 10*3/uL (ref 0.1–1.0)
NEUTROS PCT: 51.3 % (ref 43.0–77.0)
Neutro Abs: 3.7 10*3/uL (ref 1.4–7.7)
Platelets: 310 10*3/uL (ref 150.0–400.0)
RBC: 4.41 Mil/uL (ref 3.87–5.11)
RDW: 14 % (ref 11.5–15.5)
WBC: 7.2 10*3/uL (ref 4.0–10.5)

## 2015-09-13 LAB — URINE CULTURE: Colony Count: 70000

## 2015-09-13 LAB — MICROALBUMIN / CREATININE URINE RATIO
Creatinine,U: 81.9 mg/dL
Microalb Creat Ratio: 1.7 mg/g (ref 0.0–30.0)
Microalb, Ur: 1.4 mg/dL (ref 0.0–1.9)

## 2015-09-13 LAB — TSH: TSH: 10.47 u[IU]/mL — AB (ref 0.35–4.50)

## 2015-09-13 LAB — LIPID PANEL
CHOL/HDL RATIO: 5
Cholesterol: 202 mg/dL — ABNORMAL HIGH (ref 0–200)
HDL: 38.2 mg/dL — AB (ref >39.00)
LDL CALC: 128 mg/dL — AB (ref 0–99)
NONHDL: 163.72
Triglycerides: 178 mg/dL — ABNORMAL HIGH (ref 0.0–149.0)
VLDL: 35.6 mg/dL (ref 0.0–40.0)

## 2015-09-16 ENCOUNTER — Other Ambulatory Visit: Payer: Self-pay

## 2015-09-16 DIAGNOSIS — E039 Hypothyroidism, unspecified: Secondary | ICD-10-CM

## 2015-09-16 DIAGNOSIS — E785 Hyperlipidemia, unspecified: Secondary | ICD-10-CM

## 2015-09-16 MED ORDER — LEVOTHYROXINE SODIUM 100 MCG PO TABS: ORAL_TABLET | ORAL | 2 refills | Status: DC

## 2015-09-16 MED ORDER — ATORVASTATIN CALCIUM 40 MG PO TABS: 40.0000 mg | ORAL_TABLET | Freq: Every day | ORAL | 2 refills | Status: DC

## 2015-10-15 ENCOUNTER — Other Ambulatory Visit: Payer: Self-pay | Admitting: Family Medicine

## 2015-10-15 DIAGNOSIS — E785 Hyperlipidemia, unspecified: Secondary | ICD-10-CM

## 2015-10-17 MED ORDER — FENOFIBRATE MICRONIZED 90 MG PO CAPS: ORAL_CAPSULE | ORAL | 0 refills | Status: DC

## 2015-11-17 ENCOUNTER — Encounter: Payer: Self-pay | Admitting: Family Medicine

## 2015-11-17 ENCOUNTER — Other Ambulatory Visit (HOSPITAL_COMMUNITY)
Admission: RE | Admit: 2015-11-17 | Discharge: 2015-11-17 | Disposition: A | Discharge status: Home / Self Care | Payer: Commercial Managed Care - PPO | Source: Ambulatory Visit | Attending: Family Medicine | Admitting: Family Medicine

## 2015-11-17 ENCOUNTER — Ambulatory Visit (INDEPENDENT_AMBULATORY_CARE_PROVIDER_SITE_OTHER): Payer: Commercial Managed Care - PPO | Admitting: Family Medicine

## 2015-11-17 VITALS — BP 162/81 | HR 70 | Temp 97.9°F | Resp 16 | Ht 64.0 in | Wt 274.8 lb

## 2015-11-17 DIAGNOSIS — N760 Acute vaginitis: Secondary | ICD-10-CM | POA: Diagnosis present

## 2015-11-17 MED ORDER — FLUCONAZOLE 150 MG PO TABS: 150.0000 mg | ORAL_TABLET | Freq: Once | ORAL | 1 refills | Status: AC

## 2015-11-17 NOTE — Patient Instructions (Signed)

## 2015-11-17 NOTE — Progress Notes (Signed)
Pre visit review using our clinic review tool, if applicable. No additional management support is needed unless otherwise documented below in the visit note. 

## 2015-11-17 NOTE — Progress Notes (Signed)
Patient ID: Amy Mahoney, female    DOB: 14-Dec-1950  Age: 65 y.o. MRN: [id number]    Subjective:  Subjective  HPI Amy Mahoney presents for vaginal itching -- she has used monistat and vagisil with no relief.      Review of Systems  Constitutional: Negative for appetite change, diaphoresis, fatigue and unexpected weight change.  Eyes: Negative for pain, redness and visual disturbance.  Respiratory: Negative for cough, chest tightness, shortness of breath and wheezing.   Cardiovascular: Negative for chest pain, palpitations and leg swelling.  Endocrine: Negative for cold intolerance, heat intolerance, polydipsia, polyphagia and polyuria.  Genitourinary: Positive for vaginal discharge. Negative for difficulty urinating, dysuria and frequency.  Neurological: Negative for dizziness, light-headedness, numbness and headaches.    History Past Medical History:  Diagnosis Date  . Depression   . Sleep apnea   . Spider bite   . Thyroid disease    hypothyroidism    She has a past surgical history that includes Cholecystectomy; Dilation and curettage of uterus; Tonsillectomy; and fibroids scrapping.   Her family history includes Breast cancer in her maternal aunt and paternal grandmother; Cancer in her maternal aunt and maternal aunt; Diabetes in her maternal aunt and mother; Lung cancer in her maternal aunt.She reports that she quit smoking about 25 years ago. Her smoking use included Cigarettes. She has a 1.50 pack-year smoking history. She has never used smokeless tobacco. She reports that she drinks alcohol. She reports that she does not use drugs.  Current Outpatient Prescriptions on File Prior to Visit  Medication Sig Dispense Refill  . atorvastatin (LIPITOR) 40 MG tablet Take 1 tablet (40 mg total) by mouth daily. Repeat labs are due now 30 tablet 2  . Calcium Carbonate-Vitamin D (CALCIUM 500 + D PO) Take 1 tablet by mouth 2 (two) times daily.      . Fenofibrate Micronized (ANTARA) 90  MG CAPS TAKE 1 CAPSULE BY MOUTH DAILY. REPEAT LABS ARE DUE NOW 90 capsule 0  . hydrochlorothiazide (HYDRODIURIL) 25 MG tablet TAKE 1 TABLET BY MOUTH EVERY DAY (MAX ON INS) 90 tablet 1  . levothyroxine (SYNTHROID, LEVOTHROID) 100 MCG tablet TAKE 1 TABLET BY MOUTH DAILY BEFORE BREAKFAST. 30 tablet 2  . Multiple Vitamin (MULTIVITAMIN) tablet Take 1 tablet by mouth daily.      [name] venlafaxine XR (EFFEXOR-XR) 150 MG 24 hr capsule Take 1 capsule (150 mg total) by mouth daily. 90 capsule 1   No current facility-administered medications on file prior to visit.      Objective:  Objective  Physical Exam  Constitutional: She is oriented to person, place, and time. She appears well-developed and well-nourished.  HENT:  Head: Normocephalic and atraumatic.  Eyes: Conjunctivae and EOM are normal.  Neck: Normal range of motion. Neck supple. No JVD present. Carotid bruit is not present. No thyromegaly present.  Cardiovascular: Normal rate, regular rhythm and normal heart sounds.   No murmur heard. Pulmonary/Chest: Effort normal and breath sounds normal. No respiratory distress. She has no wheezes. She has no rales. She exhibits no tenderness.  Genitourinary: Cervix exhibits discharge. Vaginal discharge found.    Musculoskeletal: She exhibits no edema.  Neurological: She is alert and oriented to person, place, and time.  Psychiatric: She has a normal mood and affect. Her behavior is normal. Judgment and thought content normal.  Nursing note and vitals reviewed.  BP (P) 116/79 (BP Location: Right Arm, Patient Position: Sitting, Cuff Size: Large)   Pulse 70   Temp 97.9  F (36.6 C) (Oral)   Resp 16   Ht 5\' 4"  (1.626 m)   Wt 274 lb 12.8 oz (124.6 kg)   SpO2 95%   BMI 47.17 kg/m  Wt Readings from Last 3 Encounters:  11/17/15 274 lb 12.8 oz (124.6 kg)  09/12/15 277 lb (125.6 kg)  07/12/15 279 lb 3.2 oz (126.6 kg)     Lab Results  Component Value Date   WBC 7.2 09/12/2015   HGB 12.9 09/12/2015     HCT 39.3 09/12/2015   PLT 310.0 09/12/2015   GLUCOSE 122 (H) 09/12/2015   CHOL 202 (H) 09/12/2015   TRIG 178.0 (H) 09/12/2015   HDL 38.20 (L) 09/12/2015   LDLDIRECT 193.3 11/06/2012   LDLCALC 128 (H) 09/12/2015   ALT 16 09/12/2015   AST 23 09/12/2015   NA 140 09/12/2015   K 3.9 09/12/2015   CL 104 09/12/2015   CREATININE 0.95 09/12/2015   BUN 19 09/12/2015   CO2 30 09/12/2015   TSH 10.47 (H) 09/12/2015   HGBA1C 6.2 01/07/2014   MICROALBUR 1.4 09/12/2015    Mm Digital Screening Bilateral  Result Date: 09/16/2015 CLINICAL DATA:  Screening. EXAM: DIGITAL SCREENING BILATERAL MAMMOGRAM WITH CAD COMPARISON:  Previous exam(s). ACR Breast Density Category b: There are scattered areas of fibroglandular density. FINDINGS: There are no findings suspicious for malignancy. Images were processed with CAD. IMPRESSION: No mammographic evidence of malignancy. A result letter of this screening mammogram will be mailed directly to the patient. RECOMMENDATION: Screening mammogram in one year. (Code:SM-B-01Y) BI-RADS CATEGORY  1: Negative. Electronically Signed   By: Dorise Bullion III M.D   On: 09/16/2015 17:18    Assessment & Plan:  Plan  I am having Ms. Husk start on fluconazole. I am also having her maintain her Calcium Carbonate-Vitamin D (CALCIUM 500 + D PO), multivitamin, hydrochlorothiazide, venlafaxine XR, levothyroxine, atorvastatin, and Fenofibrate Micronized.  Meds ordered this encounter  Medications  . fluconazole (DIFLUCAN) 150 MG tablet    Sig: Take 1 tablet (150 mg total) by mouth once. May repeat in 3 days prn    Dispense:  1 tablet    Refill:  1    Problem List Items Addressed This Visit    None    Visit Diagnoses    Vaginitis and vulvovaginitis    -  Primary   Relevant Medications   fluconazole (DIFLUCAN) 150 MG tablet   Other Relevant Orders   Cervicovaginal ancillary only      Follow-up: No Follow-up on file.  [name], DO

## 2015-11-18 ENCOUNTER — Other Ambulatory Visit: Payer: Self-pay

## 2015-11-18 LAB — CERVICOVAGINAL ANCILLARY ONLY: Wet Prep (BD Affirm): POSITIVE — AB

## 2015-11-18 MED ORDER — METRONIDAZOLE 500 MG PO TABS: 500.0000 mg | ORAL_TABLET | Freq: Two times a day (BID) | ORAL | 0 refills | Status: DC

## 2015-11-19 ENCOUNTER — Other Ambulatory Visit: Payer: Self-pay | Admitting: Family Medicine

## 2015-11-21 ENCOUNTER — Telehealth: Payer: Self-pay | Admitting: Family Medicine

## 2015-11-21 NOTE — Telephone Encounter (Signed)
Called patient and LVM on her cell phone to let her know that her orders are ready and she may schedule her lab work.

## 2015-11-21 NOTE — Telephone Encounter (Signed)
Patient sent a message stating that she needed to get her thyroid levels checked after being on her medication for 2 months. Can we get lab orders for her? Please advise.

## 2015-11-21 NOTE — Telephone Encounter (Signed)
The orders are already in.    [place]

## 2015-11-22 ENCOUNTER — Other Ambulatory Visit: Payer: Commercial Managed Care - PPO

## 2015-11-22 LAB — CBC
HEMATOCRIT: 39.4 % (ref 36.0–46.0)
Hemoglobin: 13.4 g/dL (ref 12.0–15.0)
MCHC: 34 g/dL (ref 30.0–36.0)
MCV: 89.2 fl (ref 78.0–100.0)
Platelets: 283 10*3/uL (ref 150.0–400.0)
RBC: 4.41 Mil/uL (ref 3.87–5.11)
RDW: 13.5 % (ref 11.5–15.5)
WBC: 5.9 10*3/uL (ref 4.0–10.5)

## 2015-11-22 LAB — COMPREHENSIVE METABOLIC PANEL
ALT: 20 U/L (ref 0–35)
AST: 30 U/L (ref 0–37)
Albumin: 3.9 g/dL (ref 3.5–5.2)
Alkaline Phosphatase: 41 U/L (ref 39–117)
BUN: 22 mg/dL (ref 6–23)
CHLORIDE: 103 meq/L (ref 96–112)
CO2: 33 meq/L — AB (ref 19–32)
Calcium: 9.5 mg/dL (ref 8.4–10.5)
Creatinine, Ser: 0.87 mg/dL (ref 0.40–1.20)
GFR: 69.22 mL/min (ref >60.00)
GLUCOSE: 146 mg/dL — AB (ref 70–99)
POTASSIUM: 4.2 meq/L (ref 3.5–5.1)
SODIUM: 141 meq/L (ref 135–145)
Total Bilirubin: 0.4 mg/dL (ref 0.2–1.2)
Total Protein: 6.9 g/dL (ref 6.0–8.3)

## 2015-11-22 LAB — LIPID PANEL
CHOL/HDL RATIO: 4
Cholesterol: 159 mg/dL (ref 0–200)
HDL: 41.7 mg/dL (ref >39.00)
LDL CALC: 102 mg/dL — AB (ref 0–99)
NONHDL: 117.21
TRIGLYCERIDES: 76 mg/dL (ref 0.0–149.0)
VLDL: 15.2 mg/dL (ref 0.0–40.0)

## 2015-11-22 LAB — TSH: TSH: 3.54 u[IU]/mL (ref 0.35–4.50)

## 2015-12-01 ENCOUNTER — Encounter: Payer: Self-pay | Admitting: Family Medicine

## 2015-12-02 ENCOUNTER — Other Ambulatory Visit: Payer: Self-pay

## 2015-12-02 MED ORDER — CLINDAMYCIN PHOSPHATE 2 % VA CREA
1.0000 | TOPICAL_CREAM | Freq: Every day | VAGINAL | 0 refills | Status: DC
Start: 2015-12-02 — End: 2016-11-30

## 2015-12-02 NOTE — Telephone Encounter (Signed)
Clindamycin cream 2% I app PV Qhs x 7 days Consider gyn referral if no better

## 2015-12-02 NOTE — Progress Notes (Signed)
cin

## 2015-12-09 ENCOUNTER — Other Ambulatory Visit: Payer: Self-pay | Admitting: Family Medicine

## 2015-12-09 DIAGNOSIS — E039 Hypothyroidism, unspecified: Secondary | ICD-10-CM

## 2015-12-09 DIAGNOSIS — E785 Hyperlipidemia, unspecified: Secondary | ICD-10-CM

## 2015-12-16 ENCOUNTER — Other Ambulatory Visit: Payer: Self-pay | Admitting: Family Medicine

## 2015-12-16 ENCOUNTER — Encounter: Payer: Self-pay | Admitting: Family Medicine

## 2015-12-16 DIAGNOSIS — N760 Acute vaginitis: Principal | ICD-10-CM

## 2015-12-16 DIAGNOSIS — B9689 Other specified bacterial agents as the cause of diseases classified elsewhere: Secondary | ICD-10-CM

## 2015-12-16 NOTE — Telephone Encounter (Signed)
The referral has been placed, would you like to treat?    KP

## 2015-12-16 NOTE — Telephone Encounter (Signed)
Ok to refer to [facility] for recurrent BV

## 2016-01-28 ENCOUNTER — Other Ambulatory Visit: Payer: Self-pay | Admitting: Family Medicine

## 2016-02-02 ENCOUNTER — Other Ambulatory Visit: Payer: Self-pay | Admitting: Family Medicine

## 2016-03-08 ENCOUNTER — Other Ambulatory Visit: Payer: Self-pay | Admitting: Family Medicine

## 2016-03-08 DIAGNOSIS — E039 Hypothyroidism, unspecified: Secondary | ICD-10-CM

## 2016-03-08 DIAGNOSIS — E785 Hyperlipidemia, unspecified: Secondary | ICD-10-CM

## 2016-03-10 ENCOUNTER — Other Ambulatory Visit: Payer: Self-pay | Admitting: Family Medicine

## 2016-03-10 DIAGNOSIS — E039 Hypothyroidism, unspecified: Secondary | ICD-10-CM

## 2016-05-18 ENCOUNTER — Other Ambulatory Visit: Payer: Self-pay | Admitting: Family Medicine

## 2016-05-23 ENCOUNTER — Encounter: Payer: Self-pay | Admitting: Family Medicine

## 2016-05-24 ENCOUNTER — Other Ambulatory Visit: Payer: Self-pay | Admitting: Family Medicine

## 2016-05-24 DIAGNOSIS — E785 Hyperlipidemia, unspecified: Secondary | ICD-10-CM

## 2016-05-24 DIAGNOSIS — E039 Hypothyroidism, unspecified: Secondary | ICD-10-CM

## 2016-05-24 MED ORDER — FENOFIBRATE 160 MG PO TABS: 160.0000 mg | ORAL_TABLET | Freq: Every day | ORAL | 3 refills | Status: DC

## 2016-06-05 ENCOUNTER — Encounter (HOSPITAL_COMMUNITY): Payer: Self-pay

## 2016-06-08 ENCOUNTER — Other Ambulatory Visit (INDEPENDENT_AMBULATORY_CARE_PROVIDER_SITE_OTHER): Payer: Commercial Managed Care - PPO

## 2016-06-08 DIAGNOSIS — E785 Hyperlipidemia, unspecified: Secondary | ICD-10-CM

## 2016-06-08 DIAGNOSIS — E039 Hypothyroidism, unspecified: Secondary | ICD-10-CM

## 2016-06-08 LAB — COMPREHENSIVE METABOLIC PANEL
ALT: 16 U/L (ref 0–35)
AST: 24 U/L (ref 0–37)
Albumin: 4.1 g/dL (ref 3.5–5.2)
Alkaline Phosphatase: 41 U/L (ref 39–117)
BUN: 20 mg/dL (ref 6–23)
CO2: 34 mEq/L — ABNORMAL HIGH (ref 19–32)
Calcium: 10 mg/dL (ref 8.4–10.5)
Chloride: 105 mEq/L (ref 96–112)
Creatinine, Ser: 0.97 mg/dL (ref 0.40–1.20)
GFR: 60.95 mL/min (ref >60.00)
Glucose, Bld: 188 mg/dL — ABNORMAL HIGH (ref 70–99)
Potassium: 4.6 mEq/L (ref 3.5–5.1)
Sodium: 145 mEq/L (ref 135–145)
Total Bilirubin: 0.4 mg/dL (ref 0.2–1.2)
Total Protein: 6.7 g/dL (ref 6.0–8.3)

## 2016-06-08 LAB — LIPID PANEL
Cholesterol: 132 mg/dL (ref 0–200)
HDL: 38.4 mg/dL — ABNORMAL LOW (ref >39.00)
LDL Cholesterol: 73 mg/dL (ref 0–99)
NonHDL: 93.23
Total CHOL/HDL Ratio: 3
Triglycerides: 100 mg/dL (ref 0.0–149.0)
VLDL: 20 mg/dL (ref 0.0–40.0)

## 2016-06-08 LAB — TSH: TSH: 2.98 u[IU]/mL (ref 0.35–4.50)

## 2016-06-10 ENCOUNTER — Other Ambulatory Visit: Payer: Self-pay | Admitting: Family Medicine

## 2016-06-10 DIAGNOSIS — E785 Hyperlipidemia, unspecified: Secondary | ICD-10-CM

## 2016-06-10 DIAGNOSIS — E039 Hypothyroidism, unspecified: Secondary | ICD-10-CM

## 2016-06-13 ENCOUNTER — Other Ambulatory Visit: Payer: Self-pay | Admitting: Family Medicine

## 2016-06-13 DIAGNOSIS — R739 Hyperglycemia, unspecified: Secondary | ICD-10-CM

## 2016-06-14 ENCOUNTER — Other Ambulatory Visit (INDEPENDENT_AMBULATORY_CARE_PROVIDER_SITE_OTHER): Payer: Commercial Managed Care - PPO

## 2016-06-14 DIAGNOSIS — R739 Hyperglycemia, unspecified: Secondary | ICD-10-CM

## 2016-06-14 LAB — BASIC METABOLIC PANEL
BUN: 20 mg/dL (ref 6–23)
CALCIUM: 9.5 mg/dL (ref 8.4–10.5)
CHLORIDE: 102 meq/L (ref 96–112)
CO2: 30 meq/L (ref 19–32)
Creatinine, Ser: 0.91 mg/dL (ref 0.40–1.20)
GFR: 65.61 mL/min (ref >60.00)
GLUCOSE: 151 mg/dL — AB (ref 70–99)
Potassium: 3.7 mEq/L (ref 3.5–5.1)
SODIUM: 139 meq/L (ref 135–145)

## 2016-06-14 LAB — HEMOGLOBIN A1C: Hgb A1c MFr Bld: 6.7 % — ABNORMAL HIGH (ref 4.6–6.5)

## 2016-06-20 ENCOUNTER — Other Ambulatory Visit: Payer: Self-pay | Admitting: Family Medicine

## 2016-06-20 DIAGNOSIS — E782 Mixed hyperlipidemia: Secondary | ICD-10-CM

## 2016-07-10 ENCOUNTER — Encounter: Payer: Self-pay | Admitting: Internal Medicine

## 2016-07-11 ENCOUNTER — Ambulatory Visit (INDEPENDENT_AMBULATORY_CARE_PROVIDER_SITE_OTHER): Payer: Commercial Managed Care - PPO | Admitting: Internal Medicine

## 2016-07-11 ENCOUNTER — Encounter: Payer: Self-pay | Admitting: Internal Medicine

## 2016-07-11 DIAGNOSIS — G4733 Obstructive sleep apnea (adult) (pediatric): Secondary | ICD-10-CM | POA: Diagnosis not present

## 2016-07-11 NOTE — Patient Instructions (Signed)
Order- [facility]- please increase CPAP to 12 cwp, continue mask of choice, humidifier, supplies AirView                Dx OSA  Suggest you ask [facility] when you might be eligible to replace your older CPAP machine. If you let us know when you are ready, we can put the order in. I will probably take the opportunity to change to AutoPAP at that time.   Please call if we can help

## 2016-07-11 NOTE — Assessment & Plan Note (Signed)
She is very compliant with her CPAP as confirmed by download, but he would like better control if possible. Weight loss would help. Plan-increased pressure to 12. When she is eligible for replacement of old machine I would like to change her to AutoPap.

## 2016-07-11 NOTE — Progress Notes (Signed)
HPI female former smoker followed for OSA, Cough, complicated by obesity, hypothyroid, GERD NPSG 08/03/94 RDI/AHI 49/hr, weight was 285 pounds  ----------------------------------------------------------------------------  07/01/14- 63 yoF Former smoker followed for OSA, Cough, complicated by  obesity, hypothyroid, GERD CPAP 7/ [facility] Follows For: Pt states breathing has improved. Uses CPAP 7-8 hours nightly , mask fits fine. No new complaints.  "Life much better" on CPAP. Bronchopneumonia resolved on f/u CXR. CXR 01/21/14- image reviewed IMPRESSION: 1. Significant improvement in the appearance the chest with complete resolution of airspace disease in the lung bases bilaterally, with minimal residual atelectasis or scarring in the left lower lobe. Electronically Signed  By: Vinnie Langton M.D.  On: 01/21/2014 14:21  07/11/2015-66 year old female former smoker followed for OSA, Cough, complicated by obesity, hypothyroid, GERD CPAP 9/[facility]  FOLLOWS FOR: DME: [facility]. Pt states she wears CPAP every night; DL attached.   07/11/16- 66 year old female former smoker followed for OSA, Cough, complicated by obesity, hypothyroid, GERD CPAP 10/[facility]>> 12 today FOLLOW UP FOR 1 year follow DME is [facility] patient states that she has been doing well over the year she states that she is using  her CPAP about 6 to  hours a night  CPAP 10 per download indicates 100%/4 hour compliance but residual AHI of 12.1/hour. She notices she is waking a little more at night but not told that she snores. She think she sleeps okay and feels reasonably well rested. She had had a bronchopneumonia syndrome, probably viral, a couple of years ago. This has resolved completely.  ROS-see HPI Constitutional:   No-   weight loss, night sweats, fevers, chills, fatigue, lassitude. HEENT:   No-  headaches, difficulty swallowing, tooth/dental problems, sore throat,       No-  sneezing, itching, ear ache, nasal congestion, post  nasal drip,  CV:  No-   chest pain, orthopnea, PND, swelling in lower extremities, anasarca,  dizziness, palpitations Resp: No-   shortness of breath with exertion or at rest.               productive cough,   non-productive cough,  No- coughing up of blood.              No-   change in color of mucus.  No- wheezing.   Skin: No-   rash or lesions. GI:  No-   heartburn, indigestion, abdominal pain, nausea, vomiting, GU: . MS:  No-   joint pain or swelling.   Neuro-     nothing unusual Psych:  No- change in mood or affect. No depression or anxiety.  No memory loss.  OBJ- Physical Exam General- Alert, Oriented, Affect-appropriate, Distress- none acute, + obese Skin- rash-none, lesions- none, excoriation- none Lymphadenopathy- none Head- atraumatic            Eyes- Gross vision intact, PERRLA, conjunctivae and secretions clear            Ears- Hearing, canals-normal            Nose- Clear, no-Septal dev, mucus, polyps, erosion, perforation             Throat- Mallampati III-IV , mucosa clear , drainage- none, tonsils- atrophic Neck- flexible , trachea midline, no stridor , thyroid nl, carotid no bruit Chest - symmetrical excursion , unlabored           Heart/CV- RRR , no murmur , no gallop  , no rub, nl s1 s2                           -  JVD- none , edema- none, stasis changes- none, varices- none           Lung- clear, wheeze- none, cough- none , dullness-none, rub- none           Chest wall-  Abd-  Br/ Gen/ Rectal- Not done, not indicated Extrem- cyanosis- none, clubbing, none, atrophy- none, strength- nl Neuro- grossly intact to observation   .

## 2016-07-11 NOTE — Assessment & Plan Note (Signed)
She has not been able to change her lifestyle sufficiently to accomplish meaningful weight loss. Encouragement given.

## 2016-08-13 ENCOUNTER — Other Ambulatory Visit: Payer: Self-pay | Admitting: Family Medicine

## 2016-09-03 ENCOUNTER — Other Ambulatory Visit: Payer: Self-pay | Admitting: Family Medicine

## 2016-09-03 DIAGNOSIS — E039 Hypothyroidism, unspecified: Secondary | ICD-10-CM

## 2016-09-05 ENCOUNTER — Encounter: Payer: Self-pay | Admitting: Internal Medicine

## 2016-09-05 ENCOUNTER — Encounter: Payer: Self-pay | Admitting: Family Medicine

## 2016-09-14 ENCOUNTER — Other Ambulatory Visit: Payer: Self-pay | Admitting: Family Medicine

## 2016-09-14 DIAGNOSIS — E785 Hyperlipidemia, unspecified: Secondary | ICD-10-CM

## 2016-09-26 ENCOUNTER — Other Ambulatory Visit: Payer: Self-pay | Admitting: Family Medicine

## 2016-09-26 DIAGNOSIS — Z1231 Encounter for screening mammogram for malignant neoplasm of breast: Secondary | ICD-10-CM

## 2016-10-01 ENCOUNTER — Encounter: Payer: Self-pay | Admitting: Internal Medicine

## 2016-10-01 ENCOUNTER — Ambulatory Visit (HOSPITAL_BASED_OUTPATIENT_CLINIC_OR_DEPARTMENT_OTHER)
Admission: RE | Admit: 2016-10-01 | Discharge: 2016-10-01 | Disposition: A | Discharge status: Home / Self Care | Payer: Commercial Managed Care - PPO | Source: Ambulatory Visit | Attending: Family Medicine | Admitting: Family Medicine

## 2016-10-01 ENCOUNTER — Encounter (HOSPITAL_BASED_OUTPATIENT_CLINIC_OR_DEPARTMENT_OTHER): Payer: Self-pay

## 2016-10-01 DIAGNOSIS — G4733 Obstructive sleep apnea (adult) (pediatric): Secondary | ICD-10-CM

## 2016-10-01 DIAGNOSIS — Z1231 Encounter for screening mammogram for malignant neoplasm of breast: Secondary | ICD-10-CM | POA: Insufficient documentation

## 2016-10-08 ENCOUNTER — Ambulatory Visit (INDEPENDENT_AMBULATORY_CARE_PROVIDER_SITE_OTHER): Payer: Commercial Managed Care - PPO | Admitting: Family Medicine

## 2016-10-08 ENCOUNTER — Encounter: Payer: Self-pay | Admitting: Family Medicine

## 2016-10-08 VITALS — BP 130/76 | HR 78 | Ht 65.0 in | Wt 266.0 lb

## 2016-10-08 DIAGNOSIS — E785 Hyperlipidemia, unspecified: Secondary | ICD-10-CM | POA: Diagnosis not present

## 2016-10-08 DIAGNOSIS — G4733 Obstructive sleep apnea (adult) (pediatric): Secondary | ICD-10-CM | POA: Diagnosis not present

## 2016-10-08 DIAGNOSIS — Z0000 Encounter for general adult medical examination without abnormal findings: Secondary | ICD-10-CM | POA: Diagnosis not present

## 2016-10-08 DIAGNOSIS — R8299 Other abnormal findings in urine: Secondary | ICD-10-CM

## 2016-10-08 DIAGNOSIS — R82998 Other abnormal findings in urine: Secondary | ICD-10-CM

## 2016-10-08 DIAGNOSIS — E039 Hypothyroidism, unspecified: Secondary | ICD-10-CM | POA: Diagnosis not present

## 2016-10-08 LAB — CBC WITH DIFFERENTIAL/PLATELET
BASOS PCT: 0.9 % (ref 0.0–3.0)
Basophils Absolute: 0.1 10*3/uL (ref 0.0–0.1)
EOS PCT: 6.3 % — AB (ref 0.0–5.0)
Eosinophils Absolute: 0.4 10*3/uL (ref 0.0–0.7)
HEMATOCRIT: 39.4 % (ref 36.0–46.0)
Hemoglobin: 13 g/dL (ref 12.0–15.0)
LYMPHS PCT: 41.4 % (ref 12.0–46.0)
Lymphs Abs: 2.6 10*3/uL (ref 0.7–4.0)
MCHC: 33.1 g/dL (ref 30.0–36.0)
MCV: 90.9 fl (ref 78.0–100.0)
MONOS PCT: 8.1 % (ref 3.0–12.0)
Monocytes Absolute: 0.5 10*3/uL (ref 0.1–1.0)
NEUTROS ABS: 2.7 10*3/uL (ref 1.4–7.7)
Neutrophils Relative %: 43.3 % (ref 43.0–77.0)
PLATELETS: 301 10*3/uL (ref 150.0–400.0)
RBC: 4.33 Mil/uL (ref 3.87–5.11)
RDW: 13.9 % (ref 11.5–15.5)
WBC: 6.2 10*3/uL (ref 4.0–10.5)

## 2016-10-08 LAB — COMPREHENSIVE METABOLIC PANEL
ALBUMIN: 4 g/dL (ref 3.5–5.2)
ALK PHOS: 39 U/L (ref 39–117)
ALT: 22 U/L (ref 0–35)
AST: 31 U/L (ref 0–37)
BUN: 19 mg/dL (ref 6–23)
CHLORIDE: 103 meq/L (ref 96–112)
CO2: 30 mEq/L (ref 19–32)
Calcium: 10.1 mg/dL (ref 8.4–10.5)
Creatinine, Ser: 0.84 mg/dL (ref 0.40–1.20)
GFR: 71.89 mL/min (ref >60.00)
Glucose, Bld: 107 mg/dL — ABNORMAL HIGH (ref 70–99)
POTASSIUM: 4.1 meq/L (ref 3.5–5.1)
Sodium: 141 mEq/L (ref 135–145)
TOTAL PROTEIN: 7.2 g/dL (ref 6.0–8.3)
Total Bilirubin: 0.5 mg/dL (ref 0.2–1.2)

## 2016-10-08 LAB — POC URINALSYSI DIPSTICK (AUTOMATED)
Bilirubin, UA: NEGATIVE
GLUCOSE UA: NEGATIVE
KETONES UA: NEGATIVE
Nitrite, UA: NEGATIVE
Protein, UA: NEGATIVE
SPEC GRAV UA: 1.015 (ref 1.010–1.025)
Urobilinogen, UA: 0.2 E.U./dL
pH, UA: 6.5 (ref 5.0–8.0)

## 2016-10-08 LAB — LIPID PANEL
CHOLESTEROL: 130 mg/dL (ref 0–200)
HDL: 36 mg/dL — ABNORMAL LOW (ref >39.00)
LDL Cholesterol: 61 mg/dL (ref 0–99)
NonHDL: 93.75
TRIGLYCERIDES: 162 mg/dL — AB (ref 0.0–149.0)
Total CHOL/HDL Ratio: 4
VLDL: 32.4 mg/dL (ref 0.0–40.0)

## 2016-10-08 LAB — TSH: TSH: 1.5 u[IU]/mL (ref 0.35–4.50)

## 2016-10-08 NOTE — Assessment & Plan Note (Signed)
Tolerating statin, encouraged heart healthy diet, avoid trans fats, minimize simple carbs and saturated fats. Increase exercise as tolerated 

## 2016-10-08 NOTE — Assessment & Plan Note (Signed)
Check labs today con't meds 

## 2016-10-08 NOTE — Progress Notes (Signed)
Subjective:     Amy Mahoney is a 66 y.o. female and is here for a comprehensive physical exam. The patient reports no problems. She needs labs and f/u thyroid, cholesterol Social History   Social History  . Marital status: Single    Spouse name: N/A  . Number of children: N/A  . Years of education: N/A   Occupational History  . [employer]--controller   .  [employer]   Social History Main Topics  . Smoking status: Former Smoker    Packs/day: 0.50    Years: 3.00    Types: Cigarettes    Quit date: 08/10/1990  . Smokeless tobacco: Never Used  . Alcohol use Yes     Comment: rarely  . Drug use: No  . Sexual activity: Not Currently    Partners: Male   Other Topics Concern  . Not on file   Social History Narrative   Regular exercise - yes-- 10,000 steps   Caffeine use: 2 cups of coffee daily   Health Maintenance  Topic Date Due  . INFLUENZA VACCINE  09/19/2016  . MAMMOGRAM  10/01/2017  . COLONOSCOPY  01/01/2018  . PNA vac Low Risk Adult (2 of 2 - PPSV23) 10/31/2018  . TETANUS/TDAP  07/31/2020  . DEXA SCAN  Completed  . Hepatitis C Screening  Completed    The following portions of the patient's history were reviewed and updated as appropriate:  She  has a past medical history of Depression; Sleep apnea; Spider bite; and Thyroid disease. She  does not have any pertinent problems on file. She  has a past surgical history that includes Cholecystectomy; Dilation and curettage of uterus; Tonsillectomy; and fibroids scrapping. Her family history includes Arthritis in her unknown relative; Breast cancer in her maternal aunt and paternal grandmother; Cancer in her maternal aunt and maternal aunt; Depression in her unknown relative; Diabetes in her maternal aunt and mother; Hyperlipidemia in her unknown relative; Lung cancer in her maternal aunt. She  reports that she quit smoking about 26 years ago. Her smoking use included Cigarettes. She has a 1.50  pack-year smoking history. She has never used smokeless tobacco. She reports that she drinks alcohol. She reports that she does not use drugs. She has a current medication list which includes the following prescription(s): atorvastatin, calcium carbonate-vitamin d, clindamycin, fenofibrate, hydrochlorothiazide, hydrochlorothiazide, levothyroxine, levothyroxine, metronidazole, multivitamin, and venlafaxine xr. Current Outpatient Prescriptions on File Prior to Visit  Medication Sig Dispense Refill  . atorvastatin (LIPITOR) 40 MG tablet TAKE 1 TABLET BY MOUTH DAILY *LABS DUE 30 tablet 2  . Calcium Carbonate-Vitamin D (CALCIUM 500 + D PO) Take 1 tablet by mouth 2 (two) times daily.      . clindamycin (CLEOCIN) 2 % vaginal cream Place 1 Applicatorful vaginally at bedtime. 40 g 0  . fenofibrate 160 MG tablet Take 1 tablet (160 mg total) by mouth daily. 90 tablet 3  . hydrochlorothiazide (HYDRODIURIL) 25 MG tablet TAKE 1 TABLET BY MOUTH EVERY DAY (MAX ON INS) 90 tablet 1  . hydrochlorothiazide (HYDRODIURIL) 25 MG tablet TAKE 1 TABLET BY MOUTH DAILY (MAX PER INSURANCE) 90 tablet 1  . levothyroxine (SYNTHROID, LEVOTHROID) 100 MCG tablet TAKE 1 TABLET BY MOUTH EVERY MORNING BEFORE BREAKFAST 30 tablet 2  . levothyroxine (SYNTHROID, LEVOTHROID) 100 MCG tablet TAKE 1 TABLET BY MOUTH EVERY MORNING BEFORE BREAKFAST 30 tablet 2  . metroNIDAZOLE (FLAGYL) 500 MG tablet Take 1 tablet (500 mg total) by mouth 2 (two)  times daily. 14 tablet 0  . Multiple Vitamin (MULTIVITAMIN) tablet Take 1 tablet by mouth daily.      [name] venlafaxine XR (EFFEXOR-XR) 150 MG 24 hr capsule TAKE ONE CAPSULE BY MOUTH EVERY DAY 90 capsule 1   No current facility-administered medications on file prior to visit.    She has No Known Allergies..  Review of Systems Review of Systems  Constitutional: Negative for activity change, appetite change and fatigue.  HENT: Negative for hearing loss, congestion, tinnitus and ear discharge.  dentist  q75m Eyes: Negative for visual disturbance (see optho q1y -- vision corrected to 20/20 with glasses).  Respiratory: Negative for cough, chest tightness and shortness of breath.   Cardiovascular: Negative for chest pain, palpitations and leg swelling.  Gastrointestinal: Negative for abdominal pain, diarrhea, constipation and abdominal distention.  Genitourinary: Negative for urgency, frequency, decreased urine volume and difficulty urinating.  Musculoskeletal: Negative for back pain, arthralgias and gait problem.  Skin: Negative for color change, pallor and rash.  Neurological: Negative for dizziness, light-headedness, numbness and headaches.  Hematological: Negative for adenopathy. Does not bruise/bleed easily.  Psychiatric/Behavioral: Negative for suicidal ideas, confusion, sleep disturbance, self-injury, dysphoric mood, decreased concentration and agitation.       Objective:    BP 130/76   Pulse 78   Ht 5\' 5"  (1.651 m)   Wt 266 lb (120.7 kg)   SpO2 97%   BMI 44.26 kg/m  General appearance: alert, cooperative, appears stated age and no distress Head: Normocephalic, without obvious abnormality, atraumatic Eyes: conjunctivae/corneas clear. PERRL, EOM's intact. Fundi benign. Ears: normal TM's and external ear canals both ears Nose: Nares normal. Septum midline. Mucosa normal. No drainage or sinus tenderness. Throat: lips, mucosa, and tongue normal; teeth and gums normal Neck: no adenopathy, no carotid bruit, no JVD, supple, symmetrical, trachea midline and thyroid not enlarged, symmetric, no tenderness/mass/nodules Back: symmetric, no curvature. ROM normal. No CVA tenderness. Lungs: clear to auscultation bilaterally Breasts: normal appearance, no masses or tenderness Heart: regular rate and rhythm, S1, S2 normal, no murmur, click, rub or gallop Abdomen: soft, non-tender; bowel sounds normal; no masses,  no organomegaly Pelvic: deferred Extremities: extremities normal, atraumatic,  no cyanosis or edema Pulses: 2+ and symmetric Skin: Skin color, texture, turgor normal. No rashes or lesions Lymph nodes: Cervical, supraclavicular, and axillary nodes normal. Neurologic: Alert and oriented X 3, normal strength and tone. Normal symmetric reflexes. Normal coordination and gait    Assessment:    Healthy female exam.      Plan:  ghm utd Check labs  See After Visit Summary for Counseling Recommendations    1. Preventative health care See above  2. Hyperlipidemia LDL goal <100 Tolerating statin, encouraged heart healthy diet, avoid trans fats, minimize simple carbs and saturated fats. Increase exercise as tolerated - Comprehensive metabolic panel - Lipid panel - CBC with Differential/Platelet - TSH - POCT Urinalysis Dipstick (Automated)  3. Hypothyroidism, unspecified type Check labs con't meds - TSH  4. Obstructive sleep apnea cpap   5. Leukocytes in urine  - Urine Culture

## 2016-10-08 NOTE — Patient Instructions (Addendum)
Preventive Care 65 Years and Older, Female Preventive care refers to lifestyle choices and visits with your health care provider that can promote health and wellness. What does preventive care include?  A yearly physical exam. This is also called an annual well check.  Dental exams once or twice a year.  Routine eye exams. Ask your health care provider how often you should have your eyes checked.  Personal lifestyle choices, including: ? Daily care of your teeth and gums. ? Regular physical activity. ? Eating a healthy diet. ? Avoiding tobacco and drug use. ? Limiting alcohol use. ? Practicing safe sex. ? Taking low-dose aspirin every day. ? Taking vitamin and mineral supplements as recommended by your health care provider. What happens during an annual well check? The services and screenings done by your health care provider during your annual well check will depend on your age, overall health, lifestyle risk factors, and family history of disease. Counseling Your health care provider may ask you questions about your:  Alcohol use.  Tobacco use.  Drug use.  Emotional well-being.  Home and relationship well-being.  Sexual activity.  Eating habits.  History of falls.  Memory and ability to understand (cognition).  Work and work environment.  Reproductive health.  Screening You may have the following tests or measurements:  Height, weight, and BMI.  Blood pressure.  Lipid and cholesterol levels. These may be checked every 5 years, or more frequently if you are over 50 years old.  Skin check.  Lung cancer screening. You may have this screening every year starting at age 55 if you have a 30-pack-year history of smoking and currently smoke or have quit within the past 15 years.  Fecal occult blood test (FOBT) of the stool. You may have this test every year starting at age 50.  Flexible sigmoidoscopy or colonoscopy. You may have a sigmoidoscopy every 5 years or  a colonoscopy every 10 years starting at age 50.  Hepatitis C blood test.  Hepatitis B blood test.  Sexually transmitted disease (STD) testing.  Diabetes screening. This is done by checking your blood sugar (glucose) after you have not eaten for a while (fasting). You may have this done every 1-3 years.  Bone density scan. This is done to screen for osteoporosis. You may have this done starting at age 65.  Mammogram. This may be done every 1-2 years. Talk to your health care provider about how often you should have regular mammograms.  Talk with your health care provider about your test results, treatment options, and if necessary, the need for more tests. Vaccines Your health care provider may recommend certain vaccines, such as:  Influenza vaccine. This is recommended every year.  Tetanus, diphtheria, and acellular pertussis (Tdap, Td) vaccine. You may need a Td booster every 10 years.  Varicella vaccine. You may need this if you have not been vaccinated.  Zoster vaccine. You may need this after age 60.  Measles, mumps, and rubella (MMR) vaccine. You may need at least one dose of MMR if you were born in 1957 or later. You may also need a second dose.  Pneumococcal 13-valent conjugate (PCV13) vaccine. One dose is recommended after age 65.  Pneumococcal polysaccharide (PPSV23) vaccine. One dose is recommended after age 65.  Meningococcal vaccine. You may need this if you have certain conditions.  Hepatitis A vaccine. You may need this if you have certain conditions or if you travel or work in places where you may be exposed to hepatitis   A.  Hepatitis B vaccine. You may need this if you have certain conditions or if you travel or work in places where you may be exposed to hepatitis B.  Haemophilus influenzae type b (Hib) vaccine. You may need this if you have certain conditions.  Talk to your health care provider about which screenings and vaccines you need and how often you  need them. This information is not intended to replace advice given to you by your health care provider. Make sure you discuss any questions you have with your health care provider. Document Released: 03/04/2015 Document Revised: 10/26/2015 Document Reviewed: 12/07/2014 Elsevier Interactive Patient Education  2017 [employer].

## 2016-10-08 NOTE — Assessment & Plan Note (Signed)
Per pulm 

## 2016-10-09 ENCOUNTER — Encounter: Payer: Self-pay | Admitting: Family Medicine

## 2016-10-09 LAB — URINE CULTURE

## 2016-10-10 ENCOUNTER — Other Ambulatory Visit: Payer: Self-pay | Admitting: Family Medicine

## 2016-10-10 DIAGNOSIS — E785 Hyperlipidemia, unspecified: Secondary | ICD-10-CM

## 2016-10-16 ENCOUNTER — Encounter: Payer: Self-pay | Admitting: Internal Medicine

## 2016-10-16 DIAGNOSIS — G4733 Obstructive sleep apnea (adult) (pediatric): Secondary | ICD-10-CM

## 2016-10-16 NOTE — Telephone Encounter (Signed)
CY--  Pt is requesting that we send in a new order to her DME (waiting on confirmation of who she uses) for a new cpap machine.  She stated that she has had the current cpap machine since 2012.  CY please advise. Thanks

## 2016-10-16 NOTE — Telephone Encounter (Signed)
Ok to order [facility]- please replace old CPAP machine, change to auto10-15, mask of choice, humidifier, supplies, AirView  Dx OSA  Please be sure we see her for f/u after new machine- 31-90 days, insurance requirement.

## 2016-10-16 NOTE — Addendum Note (Signed)
Addended by: Lorane Gell on: 10/16/2016 04:55 PM   Modules accepted: Orders

## 2016-10-20 ENCOUNTER — Encounter: Payer: Self-pay | Admitting: Family Medicine

## 2016-10-23 DIAGNOSIS — G4733 Obstructive sleep apnea (adult) (pediatric): Secondary | ICD-10-CM | POA: Diagnosis not present

## 2016-11-03 IMAGING — CR DG CHEST 2V
2 series · 2 of 2 positions shown · non-contrast
Comparison: Chest x-ray 12/26/2013.  Chest CT 12/26/2013.

CLINICAL DATA: 63-year-old female recently diagnosed with pneumonia
2 weeks ago presenting with persistent cough. Followup evaluation.

EXAM:
CHEST  2 VIEW

[w chest pa]
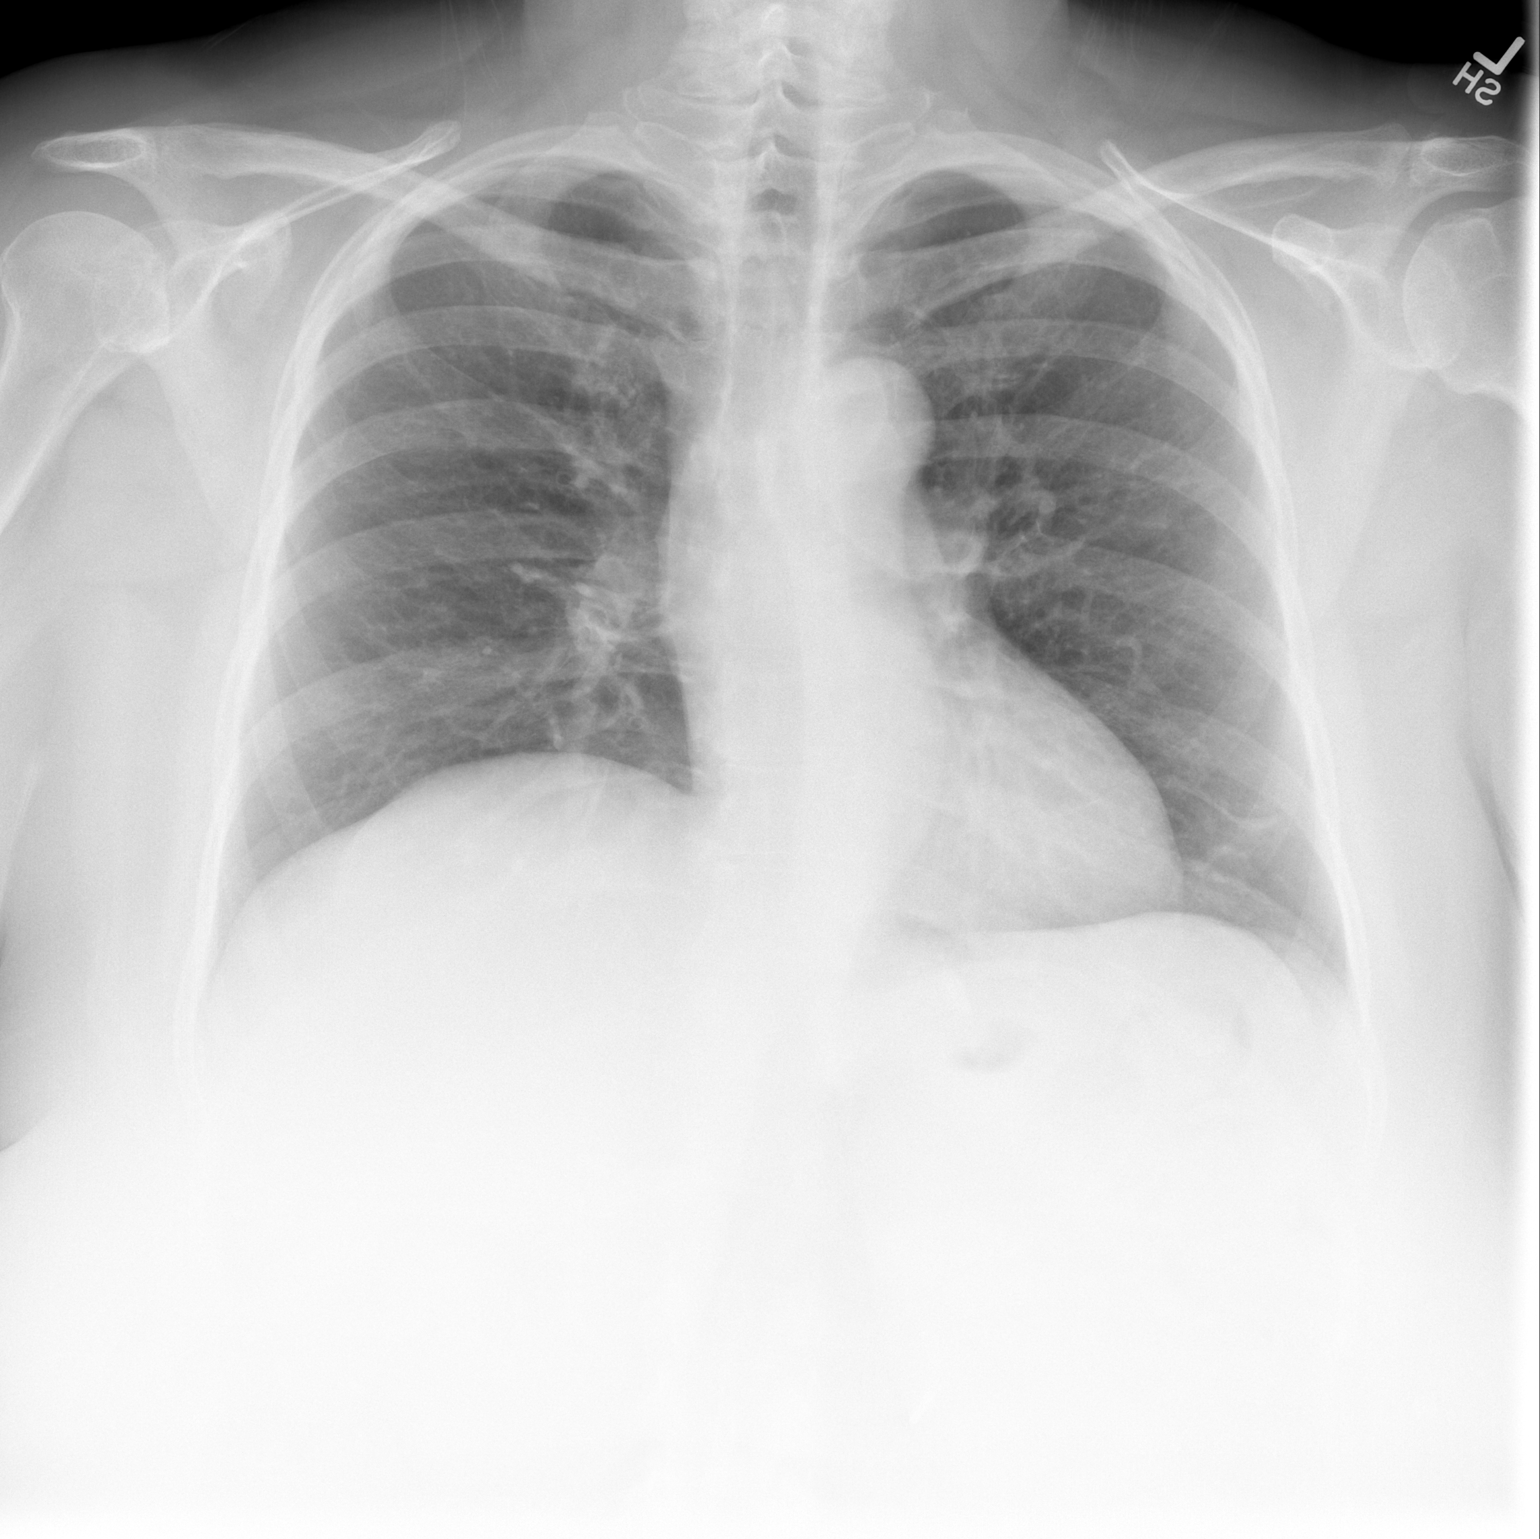

[w chest lat]
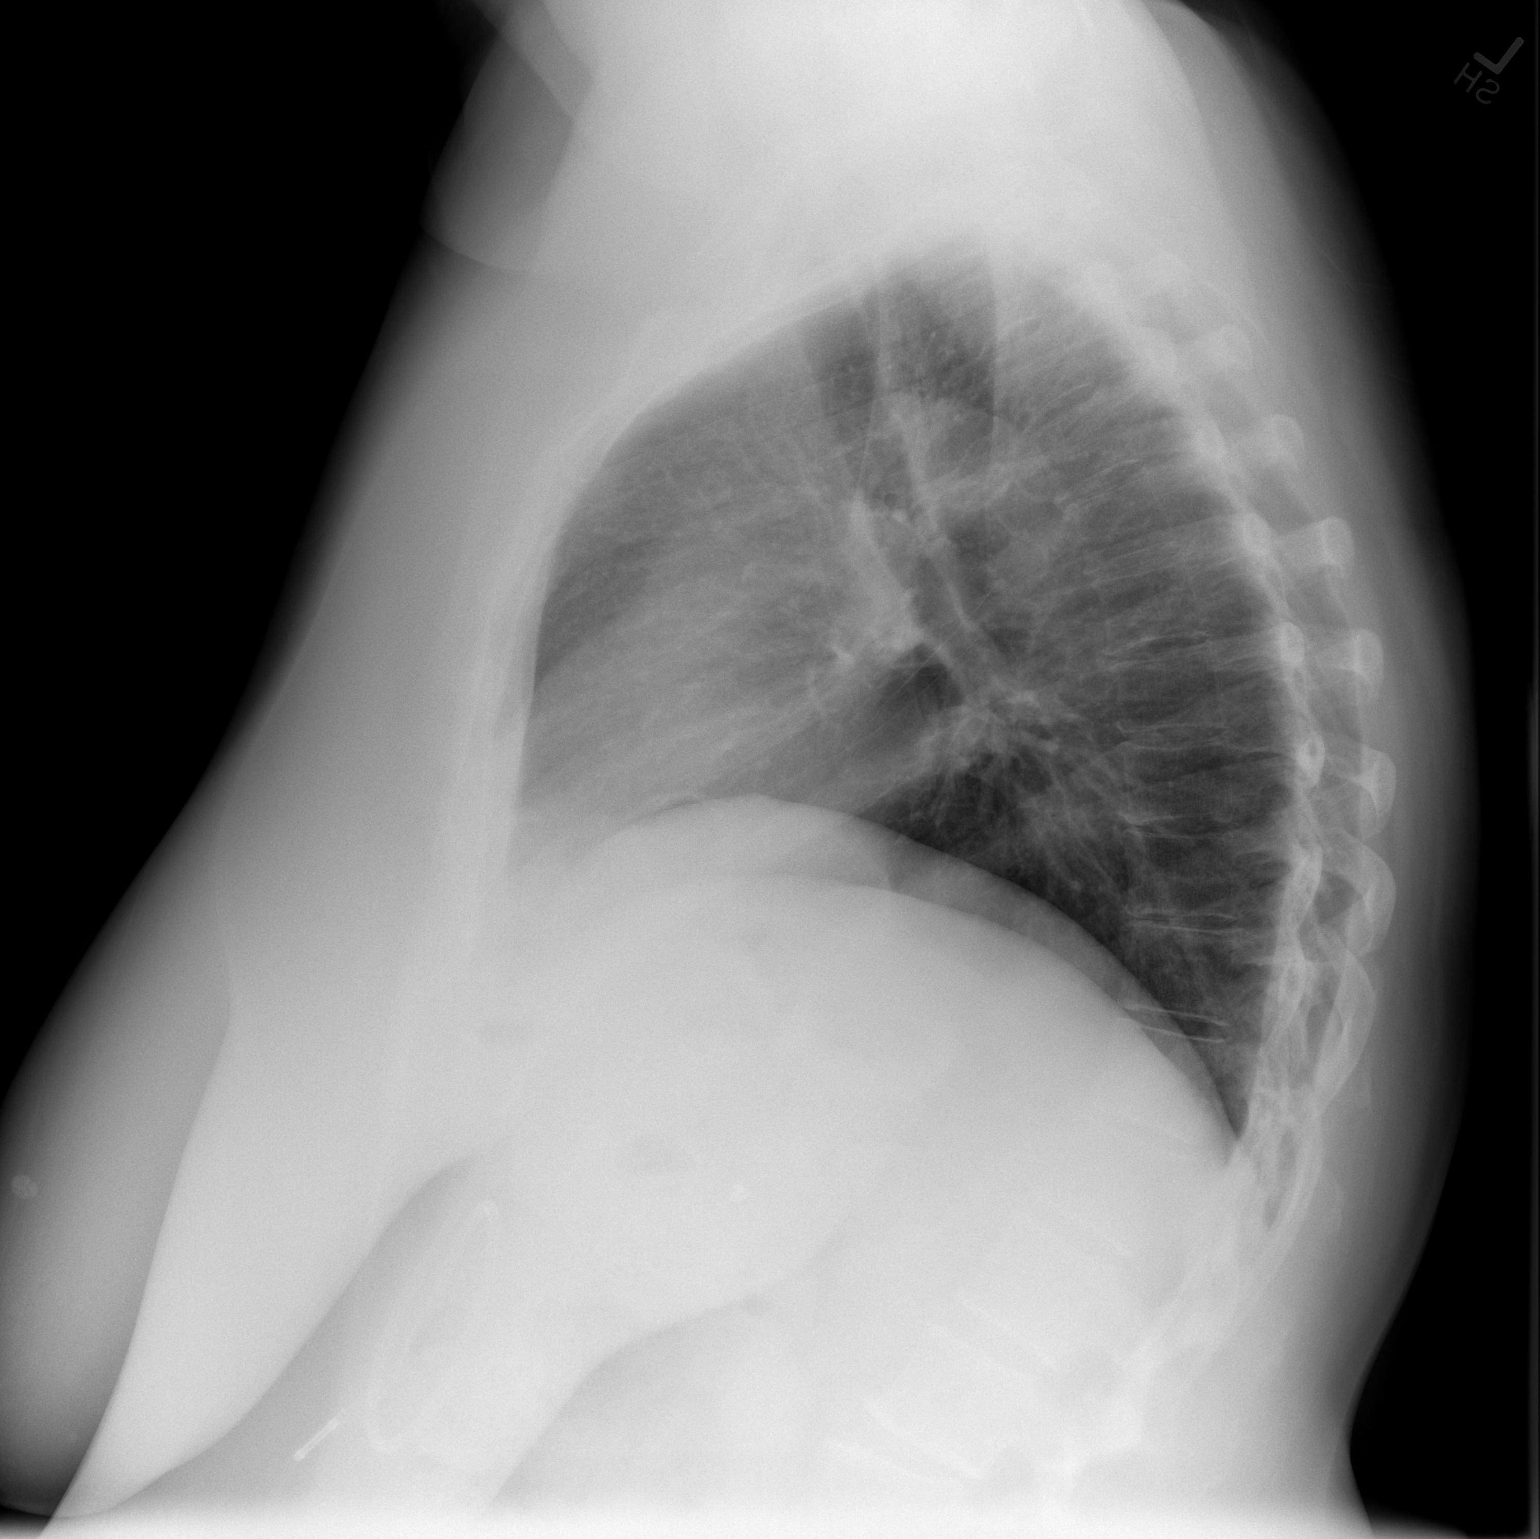

[2 of 2 positions shown; findings below may reference images not displayed]

FINDINGS: Previously noted bibasilar areas of airspace consolidation have
resolved. There is a linear opacity in the periphery of the left
lung base, likely to reflect some in residual subsegmental
atelectasis or post infectious scarring. No pleural effusion. No
evidence of pulmonary edema. Heart size is normal. Upper mediastinal
contours are within normal limits.
IMPRESSION: 1. Significant improvement in the appearance the chest with complete
resolution of airspace disease in the lung bases bilaterally, with
minimal residual atelectasis or scarring in the left lower lobe.

## 2016-11-12 ENCOUNTER — Encounter: Payer: Self-pay | Admitting: Family Medicine

## 2016-11-12 DIAGNOSIS — E039 Hypothyroidism, unspecified: Secondary | ICD-10-CM

## 2016-11-12 DIAGNOSIS — I10 Essential (primary) hypertension: Secondary | ICD-10-CM

## 2016-11-12 DIAGNOSIS — E785 Hyperlipidemia, unspecified: Secondary | ICD-10-CM

## 2016-11-12 MED ORDER — VENLAFAXINE HCL ER 150 MG PO CP24: 150.0000 mg | ORAL_CAPSULE | Freq: Every day | ORAL | 1 refills | Status: DC

## 2016-11-12 MED ORDER — LEVOTHYROXINE SODIUM 100 MCG PO TABS: 100.0000 ug | ORAL_TABLET | Freq: Every day | ORAL | 1 refills | Status: DC

## 2016-11-12 MED ORDER — FENOFIBRATE 160 MG PO TABS: 160.0000 mg | ORAL_TABLET | Freq: Every day | ORAL | 1 refills | Status: DC

## 2016-11-12 MED ORDER — HYDROCHLOROTHIAZIDE 25 MG PO TABS: 25.0000 mg | ORAL_TABLET | Freq: Every day | ORAL | 1 refills | Status: DC

## 2016-11-12 MED ORDER — ATORVASTATIN CALCIUM 40 MG PO TABS: 40.0000 mg | ORAL_TABLET | Freq: Every day | ORAL | 1 refills | Status: DC

## 2016-11-12 MED ORDER — LEVOTHYROXINE SODIUM 100 MCG PO TABS: 100.0000 ug | ORAL_TABLET | Freq: Every day | ORAL | 0 refills | Status: DC

## 2016-11-22 DIAGNOSIS — G4733 Obstructive sleep apnea (adult) (pediatric): Secondary | ICD-10-CM | POA: Diagnosis not present

## 2016-11-23 ENCOUNTER — Encounter: Payer: Self-pay | Admitting: Family Medicine

## 2016-11-26 ENCOUNTER — Telehealth: Payer: Self-pay | Admitting: Internal Medicine

## 2016-11-26 NOTE — Telephone Encounter (Signed)
CY please see requested apt dates and time. There is no availability for this time/day. Pt aware that we will contact her with an apt.  CY please advise. Thanks.

## 2016-11-26 NOTE — Telephone Encounter (Signed)
Email states:  Appointment Request From: [name]    With Provider: Deneise Lever, MD [facility]]    Preferred Date Range: From 11/26/2016 To 11/30/2016    Preferred Times: Monday Afternoon, Tuesday Afternoon, Wednesday Afternoon, Thursday Afternoon, Friday Afternoon    Reason for visit: Office Visit    Comments:  I just received a new C-Pap Machine and I got a call from [facility] telling me that I needed a follow-up with my physician.    Thanks.    Olga Coaster

## 2016-11-26 NOTE — Telephone Encounter (Signed)
Lm with pt's spouse to have pt return our call.

## 2016-11-26 NOTE — Telephone Encounter (Signed)
Can she see an NP within this insurance required window for CPAP f/u?

## 2016-11-27 NOTE — Telephone Encounter (Signed)
Called and spoke with pt and she is aware of appt with TP on Friday 10/12.

## 2016-11-30 ENCOUNTER — Ambulatory Visit (INDEPENDENT_AMBULATORY_CARE_PROVIDER_SITE_OTHER): Payer: Medicare Other | Admitting: Adult Health

## 2016-11-30 ENCOUNTER — Encounter: Payer: Self-pay | Admitting: Adult Health

## 2016-11-30 DIAGNOSIS — G4733 Obstructive sleep apnea (adult) (pediatric): Secondary | ICD-10-CM | POA: Diagnosis not present

## 2016-11-30 NOTE — Assessment & Plan Note (Signed)
Doing well on C Pap. She has excellent compliance. We'll adjust her AutoSet to help improve events and add chin strap for comfort  Plan  Patient Instructions  Add chin strap .  Change CPAP auto set to 10 to 20 cm  Keep up of the good work. Wear his C Pap each night. Do not drive a sleepy Follow Dr. Annamaria Boots in 1 year and as needed

## 2016-11-30 NOTE — Assessment & Plan Note (Signed)
Wt loss  

## 2016-11-30 NOTE — Patient Instructions (Signed)
Add chin strap .  Change CPAP auto set to 10 to 20 cm  Keep up of the good work. Wear his C Pap each night. Do not drive a sleepy Follow Dr. Annamaria Boots in 1 year and as needed

## 2016-11-30 NOTE — Addendum Note (Signed)
Addended by: Parke Poisson E on: 11/30/2016 03:00 PM   Modules accepted: Orders

## 2016-11-30 NOTE — Progress Notes (Signed)
@Patient  ID: Amy Mahoney, female    DOB: 1950/11/09, 66 y.o.   MRN: [id number]  Chief Complaint  Patient presents with  . Follow-up    OSA    Referring provider: [name], *  HPI: 66 year old female former smoker followed for obstructive sleep apnea  TEST  NPSG 08/03/94 RDI/AHI 49/hr, weight was 285 pounds  11/30/2016 Follow up : OSA  Patient returns for follow-up of sleep apnea. Patient received got a new C Pap machine. Says it's working very well. She feels rested with no significant daytime sleepiness. She wears her C Pap every night for about 8 hours. Download shows excellent compliance with average usage at 8.5 hours. Patient is on AutoSet 10-15 cm H2O. With average pressure at 15 cm H2O , AHI 8.4. Positive leaks. Patient changed  from a fullface mask to nasal pillows. Does have some mouth breathing.  No Known Allergies  Immunization History  Administered Date(s) Administered  . Influenza Split 11/19/2012  . Influenza,inj,Quad PF,6+ Mos 11/20/2015  . Influenza-Unspecified 12/16/2013  . Pneumococcal Conjugate-13 11/19/2014  . Pneumococcal Polysaccharide-23 10/30/2013  . Tdap 08/01/2010  . Zoster 08/01/2010    Past Medical History:  Diagnosis Date  . Depression   . Sleep apnea   . Spider bite   . Thyroid disease    hypothyroidism    Tobacco History: History  Smoking Status  . Former Smoker  . Packs/day: 0.50  . Years: 3.00  . Types: Cigarettes  . Quit date: 08/10/1990  Smokeless Tobacco  . Never Used   Counseling given: Not Answered   Outpatient Encounter Prescriptions as of 11/30/2016  Medication Sig  . atorvastatin (LIPITOR) 40 MG tablet Take 1 tablet (40 mg total) by mouth daily.  . Calcium Carbonate-Vitamin D (CALCIUM 500 + D PO) Take 1 tablet by mouth 2 (two) times daily.    . fenofibrate 160 MG tablet Take 1 tablet (160 mg total) by mouth daily.  . hydrochlorothiazide (HYDRODIURIL) 25 MG tablet Take 1 tablet (25 mg total) by mouth  daily.  [name] levothyroxine (SYNTHROID, LEVOTHROID) 100 MCG tablet Take 1 tablet (100 mcg total) by mouth daily before breakfast.  . Multiple Vitamin (MULTIVITAMIN) tablet Take 1 tablet by mouth daily.    [name] venlafaxine XR (EFFEXOR-XR) 150 MG 24 hr capsule Take 1 capsule (150 mg total) by mouth daily.  . [DISCONTINUED] clindamycin (CLEOCIN) 2 % vaginal cream Place 1 Applicatorful vaginally at bedtime.  . [DISCONTINUED] levothyroxine (SYNTHROID, LEVOTHROID) 100 MCG tablet Take 1 tablet (100 mcg total) by mouth daily before breakfast.  . [DISCONTINUED] metroNIDAZOLE (FLAGYL) 500 MG tablet Take 1 tablet (500 mg total) by mouth 2 (two) times daily.   No facility-administered encounter medications on file as of 11/30/2016.      Review of Systems  Constitutional:   No  weight loss, night sweats,  Fevers, chills, fatigue, or  lassitude.  HEENT:   No headaches,  Difficulty swallowing,  Tooth/dental problems, or  Sore throat,                No sneezing, itching, ear ache, nasal congestion, post nasal drip,   CV:  No chest pain,  Orthopnea, PND, swelling in lower extremities, anasarca, dizziness, palpitations, syncope.   GI  No heartburn, indigestion, abdominal pain, nausea, vomiting, diarrhea, change in bowel habits, loss of appetite, bloody stools.   Resp: No shortness of breath with exertion or at rest.  No excess mucus, no productive cough,  No non-productive cough,  No  coughing up of blood.  No change in color of mucus.  No wheezing.  No chest wall deformity  Skin: no rash or lesions.  GU: no dysuria, change in color of urine, no urgency or frequency.  No flank pain, no hematuria   MS:  No joint pain or swelling.  No decreased range of motion.  No back pain.    Physical Exam  BP 132/72 (BP Location: Left Arm, Cuff Size: Normal)   Pulse 79   Ht 5' 5.5" (1.664 m)   Wt 263 lb 6 oz (119.5 kg)   SpO2 96%   BMI 43.16 kg/m   GEN: A/Ox3; pleasant , NAD, Obese   HEENT:  [place]/AT,  EACs-clear,  TMs-wnl, NOSE-clear, THROAT-clear, no lesions, no postnasal drip or exudate noted. Class 2-3 MP airway  NECK:  Supple w/ fair ROM; no JVD; normal carotid impulses w/o bruits; no thyromegaly or nodules palpated; no lymphadenopathy.    RESP  Clear  P & A; w/o, wheezes/ rales/ or rhonchi. no accessory muscle use, no dullness to percussion  CARD:  RRR, no m/r/g, no peripheral edema, pulses intact, no cyanosis or clubbing.  GI:   Soft & nt; nml bowel sounds; no organomegaly or masses detected.   Musco: Warm bil, no deformities or joint swelling noted.   Neuro: alert, no focal deficits noted.    Skin: Warm, no lesions or rashes    Lab Results:   BNP No results found for: BNP  ProBNP No results found for: PROBNP  Imaging: No results found.   Assessment & Plan:   Obstructive sleep apnea Doing well on C Pap. She has excellent compliance. We'll adjust her AutoSet to help improve events and add chin strap for comfort  Plan  Patient Instructions  Add chin strap .  Change CPAP auto set to 10 to 20 cm  Keep up of the good work. Wear his C Pap each night. Do not drive a sleepy Follow Dr. Annamaria Boots in 1 year and as needed    MORBID OBESITY Wt loss      Rexene Edison, NP 11/30/2016

## 2016-12-14 ENCOUNTER — Ambulatory Visit (INDEPENDENT_AMBULATORY_CARE_PROVIDER_SITE_OTHER): Payer: Medicare Other | Admitting: Behavioral Health

## 2016-12-14 DIAGNOSIS — Z23 Encounter for immunization: Secondary | ICD-10-CM

## 2016-12-14 NOTE — Progress Notes (Signed)
Pre visit review using our clinic review tool, if applicable. No additional management support is needed unless otherwise documented below in the visit note.  Patient came in clinic for influenza vaccination. IM injection was given in the left deltoid. Patient tolerated the injection well. 

## 2016-12-23 ENCOUNTER — Other Ambulatory Visit: Payer: Self-pay | Admitting: Family Medicine

## 2016-12-23 DIAGNOSIS — G4733 Obstructive sleep apnea (adult) (pediatric): Secondary | ICD-10-CM | POA: Diagnosis not present

## 2016-12-23 DIAGNOSIS — E039 Hypothyroidism, unspecified: Secondary | ICD-10-CM

## 2017-01-02 ENCOUNTER — Encounter: Payer: Self-pay | Admitting: Family Medicine

## 2017-01-02 ENCOUNTER — Encounter: Payer: Self-pay | Admitting: Adult Health

## 2017-01-02 NOTE — Telephone Encounter (Signed)
[facility] spoke to [facility] he has sent this over to [facility] and someone should be in contact with the patient.

## 2017-01-02 NOTE — Telephone Encounter (Signed)
PCC's please advise per protocol  Pt has not heard from [facility] re CPAP change and order was placed on 11/30/16 Thanks

## 2017-01-16 ENCOUNTER — Other Ambulatory Visit (INDEPENDENT_AMBULATORY_CARE_PROVIDER_SITE_OTHER): Payer: Medicare Other

## 2017-01-16 DIAGNOSIS — E785 Hyperlipidemia, unspecified: Secondary | ICD-10-CM | POA: Diagnosis not present

## 2017-01-16 LAB — LIPID PANEL
CHOL/HDL RATIO: 4
Cholesterol: 126 mg/dL (ref 0–200)
HDL: 33.8 mg/dL — ABNORMAL LOW (ref >39.00)
LDL Cholesterol: 73 mg/dL (ref 0–99)
NONHDL: 92.14
TRIGLYCERIDES: 94 mg/dL (ref 0.0–149.0)
VLDL: 18.8 mg/dL (ref 0.0–40.0)

## 2017-01-16 LAB — COMPREHENSIVE METABOLIC PANEL
ALT: 25 U/L (ref 0–35)
AST: 34 U/L (ref 0–37)
Albumin: 4.2 g/dL (ref 3.5–5.2)
Alkaline Phosphatase: 43 U/L (ref 39–117)
BILIRUBIN TOTAL: 0.5 mg/dL (ref 0.2–1.2)
BUN: 25 mg/dL — ABNORMAL HIGH (ref 6–23)
CO2: 31 meq/L (ref 19–32)
CREATININE: 0.96 mg/dL (ref 0.40–1.20)
Calcium: 9.9 mg/dL (ref 8.4–10.5)
Chloride: 103 mEq/L (ref 96–112)
GFR: 61.57 mL/min (ref >60.00)
GLUCOSE: 164 mg/dL — AB (ref 70–99)
Potassium: 4.3 mEq/L (ref 3.5–5.1)
Sodium: 143 mEq/L (ref 135–145)
Total Protein: 7.1 g/dL (ref 6.0–8.3)

## 2017-01-22 DIAGNOSIS — G4733 Obstructive sleep apnea (adult) (pediatric): Secondary | ICD-10-CM | POA: Diagnosis not present

## 2017-02-09 ENCOUNTER — Emergency Department (HOSPITAL_BASED_OUTPATIENT_CLINIC_OR_DEPARTMENT_OTHER)
Admission: EM | Admit: 2017-02-09 | Discharge: 2017-02-09 | Disposition: A | Discharge status: Home / Self Care | Payer: Medicare Other | Attending: Emergency Medicine | Admitting: Emergency Medicine

## 2017-02-09 ENCOUNTER — Emergency Department (HOSPITAL_BASED_OUTPATIENT_CLINIC_OR_DEPARTMENT_OTHER): Payer: Medicare Other

## 2017-02-09 ENCOUNTER — Encounter (HOSPITAL_BASED_OUTPATIENT_CLINIC_OR_DEPARTMENT_OTHER): Payer: Self-pay | Admitting: Emergency Medicine

## 2017-02-09 ENCOUNTER — Other Ambulatory Visit: Payer: Self-pay

## 2017-02-09 DIAGNOSIS — J069 Acute upper respiratory infection, unspecified: Secondary | ICD-10-CM | POA: Diagnosis not present

## 2017-02-09 DIAGNOSIS — J029 Acute pharyngitis, unspecified: Secondary | ICD-10-CM

## 2017-02-09 DIAGNOSIS — B9789 Other viral agents as the cause of diseases classified elsewhere: Secondary | ICD-10-CM | POA: Insufficient documentation

## 2017-02-09 DIAGNOSIS — E039 Hypothyroidism, unspecified: Secondary | ICD-10-CM | POA: Diagnosis not present

## 2017-02-09 DIAGNOSIS — Z87891 Personal history of nicotine dependence: Secondary | ICD-10-CM | POA: Insufficient documentation

## 2017-02-09 DIAGNOSIS — Z79899 Other long term (current) drug therapy: Secondary | ICD-10-CM | POA: Diagnosis not present

## 2017-02-09 DIAGNOSIS — R05 Cough: Secondary | ICD-10-CM | POA: Diagnosis not present

## 2017-02-09 LAB — RAPID STREP SCREEN (MED CTR MEBANE ONLY): Streptococcus, Group A Screen (Direct): NEGATIVE

## 2017-02-09 MED ORDER — ACETAMINOPHEN 325 MG PO TABS
650.0000 mg | ORAL_TABLET | Freq: Once | ORAL | Status: AC | PRN
  Administered 2017-02-09: 650 mg via ORAL
  Filled 2017-02-09: qty 2

## 2017-02-09 MED ORDER — ACETAMINOPHEN 500 MG PO TABS: 1000.0000 mg | ORAL_TABLET | Freq: Once | ORAL | Status: DC

## 2017-02-09 NOTE — ED Triage Notes (Addendum)
Sore throat x 1 week with cough.

## 2017-02-09 NOTE — Discharge Instructions (Signed)
Your chest x-ray looks clear. You are strep negative.   This is likely a viral infection that can take 1-2 weeks to get better.  Please drink plenty of fluids and get plenty of rest.  Please return without fail for worsening symptoms, including confusion, fevers greater than 6-7 days, difficulty breathing, extreme fatigue, intractable vomiting, or any other symptoms concerning to you

## 2017-02-09 NOTE — ED Provider Notes (Signed)
[place] [facility] EMERGENCY DEPARTMENT Provider Note   CSN: [id number] Arrival date & time: 02/09/17  0741     History   Chief Complaint Chief Complaint  Patient presents with  . Sore Throat  . Cough    HPI Amy Mahoney is a 66 y.o. female.  The history is provided by the patient.  Sore Throat  This is a new problem. The current episode started more than 2 days ago. The problem occurs constantly. The problem has not changed since onset.Pertinent negatives include no chest pain, no abdominal pain, no headaches and no shortness of breath. Nothing aggravates the symptoms. Nothing relieves the symptoms. She has tried nothing for the symptoms.   66 year old female who presents with sore throat, cough and congestion onset 4 days ago.  States that she has been around multiple sick contacts.  History of hypothyroidism and depression.  No prior history of immunosuppression.  Denies fevers, chills, nausea or vomiting, chest pain, difficulty breathing, throat swelling, difficulty handling secretions, or neck pain.   Past Medical History:  Diagnosis Date  . Depression   . Sleep apnea   . Spider bite   . Thyroid disease    hypothyroidism    Patient Active Problem List   Diagnosis Date Noted  . Abscess of skin 04/15/2015  . Bacterial vaginal infection 12/17/2014  . Muscle spasm of back 11/19/2014  . Cough 09/17/2013  . Bronchitis with bronchospasm 04/14/2013  . Hyperlipidemia LDL goal <100 11/06/2012  . FATIGUE 06/01/2009  . MORBID OBESITY 03/30/2008  . DEPRESSION 09/05/2006  . Hypothyroidism 05/23/2006  . DIZZINESS, CHRONIC 05/23/2006  . Obstructive sleep apnea 05/23/2006    Past Surgical History:  Procedure Laterality Date  . CHOLECYSTECTOMY    . DILATION AND CURETTAGE OF UTERUS    . fibroids scrapping    . TONSILLECTOMY      OB History    No data available       Home Medications    Prior to Admission medications   Medication Sig Start Date End Date  Taking? Authorizing Provider  atorvastatin (LIPITOR) 40 MG tablet Take 1 tablet (40 mg total) by mouth daily. 11/12/16   [name], DO  Calcium Carbonate-Vitamin D (CALCIUM 500 + D PO) Take 1 tablet by mouth 2 (two) times daily.      [provider]  fenofibrate 160 MG tablet Take 1 tablet (160 mg total) by mouth daily. 11/12/16   [name], DO  hydrochlorothiazide (HYDRODIURIL) 25 MG tablet Take 1 tablet (25 mg total) by mouth daily. 11/12/16   [name], DO  levothyroxine (SYNTHROID, LEVOTHROID) 100 MCG tablet Take 1 tablet (100 mcg total) by mouth daily before breakfast. 11/12/16   Carollee Herter, Alferd Apa, DO  Multiple Vitamin (MULTIVITAMIN) tablet Take 1 tablet by mouth daily.      [provider]  venlafaxine XR (EFFEXOR-XR) 150 MG 24 hr capsule Take 1 capsule (150 mg total) by mouth daily. 11/12/16   [name], DO    Family History Family History  Problem Relation Age of Onset  . Diabetes Mother   . Depression Unknown   . Hyperlipidemia Unknown   . Arthritis Unknown   . Diabetes Maternal Aunt   . Breast cancer Paternal Grandmother   . Lung cancer Maternal Aunt        Nonsmoker  . Cancer Maternal Aunt        breast  . Breast cancer Maternal Aunt   .  Cancer Maternal Aunt        lung(nonsmoker)  . Colon cancer Neg Hx   . Pancreatic cancer Neg Hx   . Stomach cancer Neg Hx     Social History Social History   Tobacco Use  . Smoking status: Former Smoker    Packs/day: 0.50    Years: 3.00    Pack years: 1.50    Types: Cigarettes    Last attempt to quit: 08/10/1990    Years since quitting: 26.5  . Smokeless tobacco: Never Used  Substance Use Topics  . Alcohol use: Yes    Comment: rarely  . Drug use: No     Allergies   Patient has no known allergies.   Review of Systems Review of Systems  Constitutional: Negative for fatigue and fever.  HENT: Positive for congestion and sore throat.   Respiratory: Positive  for cough. Negative for shortness of breath.   Cardiovascular: Negative for chest pain.  Gastrointestinal: Negative for abdominal pain.  Genitourinary: Negative for dysuria and frequency.  Musculoskeletal: Negative for myalgias, neck pain and neck stiffness.  Allergic/Immunologic: Negative for immunocompromised state.  Neurological: Negative for headaches.  Psychiatric/Behavioral: Negative for confusion.     Physical Exam Updated Vital Signs BP (!) 150/85 (BP Location: Left Arm)   Pulse 80   Temp (!) 100.4 F (38 C) (Oral)   Resp 20   Ht 5\' 6"  (1.676 m)   Wt 118.4 kg (261 lb)   SpO2 94%   BMI 42.13 kg/m   Physical Exam Physical Exam  Nursing note and vitals reviewed. Constitutional: Well developed, well nourished, non-toxic, and in no acute distress Head: Normocephalic and atraumatic.  Mouth/Throat: Oropharynx is clear and moist. Posterior oropharynx is erythematous but without exudates or swelling Neck: Normal range of motion. Neck supple.  Cardiovascular: Normal rate and regular rhythm.   Pulmonary/Chest: Effort normal and breath sounds normal.  Abdominal: Soft. There is no tenderness. There is no rebound and no guarding.  Musculoskeletal: Normal range of motion.  Neurological: Alert, no facial droop, fluent speech, moves all extremities symmetrically Skin: Skin is warm and dry.  Psychiatric: Cooperative   ED Treatments / Results  Labs (all labs ordered are listed, but only abnormal results are displayed) Labs Reviewed  RAPID STREP SCREEN (NOT AT [facility])  CULTURE, GROUP A STREP [facility])    EKG  EKG Interpretation None       Radiology Dg Chest 2 View  Result Date: 02/09/2017 CLINICAL DATA:  Cough, fever. EXAM: CHEST  2 VIEW COMPARISON:  Radiographs of January 21, 2014. FINDINGS: The heart size and mediastinal contours are within normal limits. Both lungs are clear. No pneumothorax or pleural effusion is noted. The visualized skeletal structures are  unremarkable. IMPRESSION: No active cardiopulmonary disease. Electronically Signed   By: Marijo Conception, M.D.   On: 02/09/2017 08:14    Procedures Procedures (including critical care time)  Medications Ordered in ED Medications  acetaminophen (TYLENOL) tablet 650 mg (650 mg Oral Given 02/09/17 0754)     Initial Impression / Assessment and Plan / ED Course  I have reviewed the triage vital signs and the nursing notes.  Pertinent labs & imaging results that were available during my care of the patient were reviewed by me and considered in my medical decision making (see chart for details).     Presents with 4 days of cough, congestion and sore throat.  Does have a fever of 100.4 Fahrenheit here, but otherwise hemodynamically stable.  She  is nontoxic in no acute distress.  Normal work of breathing without hypoxia.  Normal range of motion of neck, no voice changes, no problems breathing or having difficulties with secretions.  Her strep test is negative.  Her chest x-ray is visualized and shows no acute cardiopulmonary processes.  Her symptoms are most likely consistent with viral respiratory illness at this time.  She is encouraged to continue Tylenol and ibuprofen for fever.  Discussed supportive care management for home.  Discussed strict return precautions. She expressed understanding of all discharge instructions and felt comfortable with the plan of care.   Final Clinical Impressions(s) / ED Diagnoses   Final diagnoses:  Viral pharyngitis  Viral URI with cough    ED Discharge Orders    None       Forde Dandy, MD 02/09/17 [phone]

## 2017-02-12 LAB — CULTURE, GROUP A STREP (THRC)

## 2017-02-13 ENCOUNTER — Encounter: Payer: Self-pay | Admitting: Family Medicine

## 2017-02-22 DIAGNOSIS — G4733 Obstructive sleep apnea (adult) (pediatric): Secondary | ICD-10-CM | POA: Diagnosis not present

## 2017-02-28 ENCOUNTER — Encounter: Payer: Self-pay | Admitting: Family Medicine

## 2017-02-28 ENCOUNTER — Ambulatory Visit (INDEPENDENT_AMBULATORY_CARE_PROVIDER_SITE_OTHER): Payer: Medicare Other | Admitting: Family Medicine

## 2017-02-28 DIAGNOSIS — E039 Hypothyroidism, unspecified: Secondary | ICD-10-CM

## 2017-02-28 DIAGNOSIS — J029 Acute pharyngitis, unspecified: Secondary | ICD-10-CM

## 2017-02-28 MED ORDER — RANITIDINE HCL 300 MG PO TABS: 300.0000 mg | ORAL_TABLET | Freq: Every day | ORAL | 3 refills | Status: DC

## 2017-02-28 NOTE — Progress Notes (Signed)
Subjective:  I acted as a Education administrator for Dr. Charlett Blake. [place], [place]  Patient ID: Amy Mahoney, female    DOB: 1951/02/15, 67 y.o.   MRN: [id number]  No chief complaint on file.   HPI  Patient is in today for an acute visit for evaluation of pharyngitis that has not resolved despite antiibiotics. She has no fever and no headache but notes her sore throat will not resolve. She denies any change in bowel habits and no cough. Denies CP/palp/SOB/HA/congestion/fevers/GI or GU c/o. Taking meds as prescribed   Patient Care Team: Carollee Herter, Alferd Apa, DO as PCP - General Deneise Lever, MD as Consulting Physician (Pulmonary Disease) Determatology, [facility]   Past Medical History:  Diagnosis Date  . Depression   . Sleep apnea   . Spider bite   . Thyroid disease    hypothyroidism    Past Surgical History:  Procedure Laterality Date  . CHOLECYSTECTOMY    . DILATION AND CURETTAGE OF UTERUS    . fibroids scrapping    . TONSILLECTOMY      Family History  Problem Relation Age of Onset  . Diabetes Mother   . Depression Unknown   . Hyperlipidemia Unknown   . Arthritis Unknown   . Diabetes Maternal Aunt   . Breast cancer Paternal Grandmother   . Lung cancer Maternal Aunt        Nonsmoker  . Cancer Maternal Aunt        breast  . Breast cancer Maternal Aunt   . Cancer Maternal Aunt        lung(nonsmoker)  . Colon cancer Neg Hx   . Pancreatic cancer Neg Hx   . Stomach cancer Neg Hx     Social History   Socioeconomic History  . Marital status: Single    Spouse name: Not on file  . Number of children: Not on file  . Years of education: Not on file  . Highest education level: Not on file  Social Needs  . Financial resource strain: Not on file  . Food insecurity - worry: Not on file  . Food insecurity - inability: Not on file  . Transportation needs - medical: Not on file  . Transportation needs - non-medical: Not on file  Occupational History  . Occupation: Manufacturing engineer: [employer]    Employer: [employer]  Tobacco Use  . Smoking status: Former Smoker    Packs/day: 0.50    Years: 3.00    Pack years: 1.50    Types: Cigarettes    Last attempt to quit: 08/10/1990    Years since quitting: 26.5  . Smokeless tobacco: Never Used  Substance and Sexual Activity  . Alcohol use: Yes    Comment: rarely  . Drug use: No  . Sexual activity: Not Currently    Partners: Male  Other Topics Concern  . Not on file  Social History Narrative   Regular exercise - yes-- 10,000 steps   Caffeine use: 2 cups of coffee daily    Outpatient Medications Prior to Visit  Medication Sig Dispense Refill  . atorvastatin (LIPITOR) 40 MG tablet Take 1 tablet (40 mg total) by mouth daily. 90 tablet 1  . Calcium Carbonate-Vitamin D (CALCIUM 500 + D PO) Take 1 tablet by mouth 2 (two) times daily.      . fenofibrate 160 MG tablet Take 1 tablet (160 mg total) by mouth daily. 90 tablet 1  . hydrochlorothiazide (HYDRODIURIL) 25  MG tablet Take 1 tablet (25 mg total) by mouth daily. 90 tablet 1  . levothyroxine (SYNTHROID, LEVOTHROID) 100 MCG tablet Take 1 tablet (100 mcg total) by mouth daily before breakfast. 15 tablet 0  . Multiple Vitamin (MULTIVITAMIN) tablet Take 1 tablet by mouth daily.      [name] venlafaxine XR (EFFEXOR-XR) 150 MG 24 hr capsule Take 1 capsule (150 mg total) by mouth daily. 90 capsule 1   No facility-administered medications prior to visit.     No Known Allergies  Review of Systems  Constitutional: Negative for fever and malaise/fatigue.  HENT: Positive for sore throat. Negative for congestion.   Eyes: Negative for blurred vision.  Respiratory: Negative for shortness of breath.   Cardiovascular: Negative for chest pain, palpitations and leg swelling.  Gastrointestinal: Negative for abdominal pain, blood in stool and nausea.  Genitourinary: Negative for dysuria and frequency.  Musculoskeletal: Negative for falls.  Skin:  Negative for rash.  Neurological: Negative for dizziness, loss of consciousness and headaches.  Endo/Heme/Allergies: Negative for environmental allergies.  Psychiatric/Behavioral: Negative for depression. The patient is not nervous/anxious.        Objective:    Physical Exam  Constitutional: She is oriented to person, place, and time. She appears well-developed and well-nourished. No distress.  HENT:  Head: Normocephalic and atraumatic.  Nose: Nose normal.  Oropharynx erythematous  Eyes: Right eye exhibits no discharge. Left eye exhibits no discharge.  Neck: Normal range of motion. Neck supple.  Cardiovascular: Normal rate and regular rhythm.  No murmur heard. Pulmonary/Chest: Effort normal and breath sounds normal.  Abdominal: Soft. Bowel sounds are normal. There is no tenderness.  Musculoskeletal: She exhibits no edema.  Neurological: She is alert and oriented to person, place, and time.  Skin: Skin is warm and dry.  Psychiatric: She has a normal mood and affect.  Nursing note and vitals reviewed.   BP 138/84 (BP Location: Left Arm, Patient Position: Sitting, Cuff Size: Normal)   Pulse 74   Temp 98.1 F (36.7 C) (Oral)   Resp 18   Wt 262 lb (118.8 kg)   SpO2 94%   BMI 42.29 kg/m  Wt Readings from Last 3 Encounters:  02/28/17 262 lb (118.8 kg)  02/09/17 261 lb (118.4 kg)  11/30/16 263 lb 6 oz (119.5 kg)   BP Readings from Last 3 Encounters:  02/28/17 138/84  02/09/17 (!) 149/89  11/30/16 132/72     Immunization History  Administered Date(s) Administered  . Influenza Split 11/19/2012  . Influenza, High Dose Seasonal PF 12/14/2016  . Influenza,inj,Quad PF,6+ Mos 11/20/2015  . Influenza-Unspecified 12/16/2013  . Pneumococcal Conjugate-13 11/19/2014  . Pneumococcal Polysaccharide-23 10/30/2013  . Tdap 08/01/2010  . Zoster 08/01/2010    Health Maintenance  Topic Date Due  . MAMMOGRAM  10/01/2017  . COLONOSCOPY  01/01/2018  . PNA vac Low Risk Adult (2 of 2 -  PPSV23) 10/31/2018  . TETANUS/TDAP  07/31/2020  . INFLUENZA VACCINE  Completed  . DEXA SCAN  Completed  . Hepatitis C Screening  Completed    Lab Results  Component Value Date   WBC 6.2 10/08/2016   HGB 13.0 10/08/2016   HCT 39.4 10/08/2016   PLT 301.0 10/08/2016   GLUCOSE 164 (H) 01/16/2017   CHOL 126 01/16/2017   TRIG 94.0 01/16/2017   HDL 33.80 (L) 01/16/2017   LDLDIRECT 193.3 11/06/2012   LDLCALC 73 01/16/2017   ALT 25 01/16/2017   AST 34 01/16/2017   NA 143 01/16/2017   K  4.3 01/16/2017   CL 103 01/16/2017   CREATININE 0.96 01/16/2017   BUN 25 (H) 01/16/2017   CO2 31 01/16/2017   TSH 1.50 10/08/2016   HGBA1C 6.7 (H) 06/14/2016   MICROALBUR 1.4 09/12/2015    Lab Results  Component Value Date   TSH 1.50 10/08/2016   Lab Results  Component Value Date   WBC 6.2 10/08/2016   HGB 13.0 10/08/2016   HCT 39.4 10/08/2016   MCV 90.9 10/08/2016   PLT 301.0 10/08/2016   Lab Results  Component Value Date   NA 143 01/16/2017   K 4.3 01/16/2017   CO2 31 01/16/2017   GLUCOSE 164 (H) 01/16/2017   BUN 25 (H) 01/16/2017   CREATININE 0.96 01/16/2017   BILITOT 0.5 01/16/2017   ALKPHOS 43 01/16/2017   AST 34 01/16/2017   ALT 25 01/16/2017   PROT 7.1 01/16/2017   ALBUMIN 4.2 01/16/2017   CALCIUM 9.9 01/16/2017   GFR 61.57 01/16/2017   Lab Results  Component Value Date   CHOL 126 01/16/2017   Lab Results  Component Value Date   HDL 33.80 (L) 01/16/2017   Lab Results  Component Value Date   LDLCALC 73 01/16/2017   Lab Results  Component Value Date   TRIG 94.0 01/16/2017   Lab Results  Component Value Date   CHOLHDL 4 01/16/2017   Lab Results  Component Value Date   HGBA1C 6.7 (H) 06/14/2016         Assessment & Plan:   Problem List Items Addressed This Visit    Hypothyroidism    On Levothyroxine, continue to monitor      MORBID OBESITY    Encouraged DASH diet, decrease po intake and increase exercise as tolerated. Needs 7-8 hours of sleep  nightly. Avoid trans fats, eat small, frequent meals every 4-5 hours with lean proteins, complex carbs and healthy fats. Minimize simple carbs. Weight loss should help reflux      Pharyngitis    Suspect silent heart burn. Avoid offending foods, start probiotics. Do not eat large meals in late evening and consider raising head of bed. Start Ranitidine qhs and report if symptoms do not improve.          I am having [name] start on ranitidine. I am also having her maintain her Calcium Carbonate-Vitamin D (CALCIUM 500 + D PO), multivitamin, venlafaxine XR, hydrochlorothiazide, fenofibrate, atorvastatin, and levothyroxine.  Meds ordered this encounter  Medications  . ranitidine (ZANTAC) 300 MG tablet    Sig: Take 1 tablet (300 mg total) by mouth at bedtime.    Dispense:  30 tablet    Refill:  3    CMA served as scribe during this visit. History, Physical and Plan performed by medical provider. Documentation and orders reviewed and attested to.  Penni Homans, MD

## 2017-02-28 NOTE — Patient Instructions (Addendum)
Encouraged increased hydration and fiber in diet. Daily probiotics. If bowels not moving can use MOM 2 tbls po in 4 oz of warm prune juice by mouth every 2-3 days. If no results then repeat in 4 hours with  Dulcolax suppository pr, may repeat again in 4 more hours as needed. Seek care if symptoms worsen. Consider daily Miralax and/or Dulcolax if symptoms persist.   Miralax mixed with Benefiber once or twice daily Food Choices for Gastroesophageal Reflux Disease, Adult When you have gastroesophageal reflux disease (GERD), the foods you eat and your eating habits are very important. Choosing the right foods can help ease your discomfort. What guidelines do I need to follow?  Choose fruits, vegetables, whole grains, and low-fat dairy products.  Choose low-fat meat, fish, and poultry.  Limit fats such as oils, salad dressings, butter, nuts, and avocado.  Keep a food diary. This helps you identify foods that cause symptoms.  Avoid foods that cause symptoms. These may be different for everyone.  Eat small meals often instead of 3 large meals a day.  Eat your meals slowly, in a place where you are relaxed.  Limit fried foods.  Cook foods using methods other than frying.  Avoid drinking alcohol.  Avoid drinking large amounts of liquids with your meals.  Avoid bending over or lying down until 2-3 hours after eating. What foods are not recommended? These are some foods and drinks that may make your symptoms worse: Vegetables Tomatoes. Tomato juice. Tomato and spaghetti sauce. Chili peppers. Onion and garlic. Horseradish. Fruits Oranges, grapefruit, and lemon (fruit and juice). Meats High-fat meats, fish, and poultry. This includes hot dogs, ribs, ham, sausage, salami, and bacon. Dairy Whole milk and chocolate milk. Sour cream. Cream. Butter. Ice cream. Cream cheese. Drinks Coffee and tea. Bubbly (carbonated) drinks or energy drinks. Condiments Hot sauce. Barbecue  sauce. Sweets/Desserts Chocolate and cocoa. Donuts. Peppermint and spearmint. Fats and Oils High-fat foods. This includes [place] fries and potato chips. Other Vinegar. Strong spices. This includes black pepper, white pepper, red pepper, cayenne, curry powder, cloves, ginger, and chili powder. The items listed above may not be a complete list of foods and drinks to avoid. Contact your dietitian for more information. This information is not intended to replace advice given to you by your health care provider. Make sure you discuss any questions you have with your health care provider. Document Released: 08/07/2011 Document Revised: 07/14/2015 Document Reviewed: 12/10/2012 Elsevier Interactive Patient Education  2017 Else[employer] Inc.  Heartburn Heartburn is a type of pain or discomfort that can happen in the throat or chest. It is often described as a burning pain. It may also cause a bad taste in the mouth. Heartburn may feel worse when you lie down or bend over, and it is often worse at night. Heartburn may be caused by stomach contents that move back up into the esophagus (reflux). Follow these instructions at home: Take these actions to decrease your discomfort and to help avoid complications. Diet  Follow a diet as recommended by your health care provider. This may involve avoiding foods and drinks such as: ? Coffee and tea (with or without caffeine). ? Drinks that contain alcohol. ? Energy drinks and sports drinks. ? Carbonated drinks or sodas. ? Chocolate and cocoa. ? Peppermint and mint flavorings. ? Garlic and onions. ? Horseradish. ? Spicy and acidic foods, including peppers, chili powder, curry powder, vinegar, hot sauces, and barbecue sauce. ? Citrus fruit juices and citrus fruits, such as oranges, lemons,  and limes. ? Tomato-based foods, such as red sauce, chili, salsa, and pizza with red sauce. ? Fried and fatty foods, such as donuts, french fries, potato chips, and high-fat  dressings. ? High-fat meats, such as hot dogs and fatty cuts of red and white meats, such as rib eye steak, sausage, ham, and bacon. ? High-fat dairy items, such as whole milk, butter, and cream cheese.  Eat small, frequent meals instead of large meals.  Avoid drinking large amounts of liquid with your meals.  Avoid eating meals during the 2-3 hours before bedtime.  Avoid lying down right after you eat.  Do not exercise right after you eat. General instructions  Pay attention to any changes in your symptoms.  Take over-the-counter and prescription medicines only as told by your health care provider. Do not take aspirin, ibuprofen, or other NSAIDs unless your health care provider told you to do so.  Do not use any tobacco products, including cigarettes, chewing tobacco, and e-cigarettes. If you need help quitting, ask your health care provider.  Wear loose-fitting clothing. Do not wear anything tight around your waist that causes pressure on your abdomen.  Raise (elevate) the head of your bed about 6 inches (15 cm).  Try to reduce your stress, such as with yoga or meditation. If you need help reducing stress, ask your health care provider.  If you are overweight, reduce your weight to an amount that is healthy for you. Ask your health care provider for guidance about a safe weight loss goal.  Keep all follow-up visits as told by your health care provider. This is important. Contact a health care provider if:  You have new symptoms.  You have unexplained weight loss.  You have difficulty swallowing, or it hurts to swallow.  You have wheezing or a persistent cough.  Your symptoms do not improve with treatment.  You have frequent heartburn for more than two weeks. Get help right away if:  You have pain in your arms, neck, jaw, teeth, or back.  You feel sweaty, dizzy, or light-headed.  You have chest pain or shortness of breath.  You vomit and your vomit looks like blood  or coffee grounds.  Your stool is bloody or black. This information is not intended to replace advice given to you by your health care provider. Make sure you discuss any questions you have with your health care provider. Document Released: 06/24/2008 Document Revised: 07/14/2015 Document Reviewed: 06/02/2014 Elsevier Interactive Patient Education  [employer].

## 2017-03-03 DIAGNOSIS — J029 Acute pharyngitis, unspecified: Secondary | ICD-10-CM

## 2017-03-03 HISTORY — DX: Acute pharyngitis, unspecified: J02.9

## 2017-03-03 NOTE — Assessment & Plan Note (Signed)
Suspect silent heart burn. Avoid offending foods, start probiotics. Do not eat large meals in late evening and consider raising head of bed. Start Ranitidine qhs and report if symptoms do not improve.

## 2017-03-03 NOTE — Assessment & Plan Note (Signed)
Encouraged DASH diet, decrease po intake and increase exercise as tolerated. Needs 7-8 hours of sleep nightly. Avoid trans fats, eat small, frequent meals every 4-5 hours with lean proteins, complex carbs and healthy fats. Minimize simple carbs. Weight loss should help reflux

## 2017-03-03 NOTE — Assessment & Plan Note (Signed)
On Levothyroxine, continue to monitor 

## 2017-03-25 DIAGNOSIS — G4733 Obstructive sleep apnea (adult) (pediatric): Secondary | ICD-10-CM | POA: Diagnosis not present

## 2017-03-30 ENCOUNTER — Other Ambulatory Visit: Payer: Self-pay | Admitting: Family Medicine

## 2017-03-30 DIAGNOSIS — E039 Hypothyroidism, unspecified: Secondary | ICD-10-CM

## 2017-03-30 DIAGNOSIS — E785 Hyperlipidemia, unspecified: Secondary | ICD-10-CM

## 2017-03-30 DIAGNOSIS — I10 Essential (primary) hypertension: Secondary | ICD-10-CM

## 2017-04-22 DIAGNOSIS — G4733 Obstructive sleep apnea (adult) (pediatric): Secondary | ICD-10-CM | POA: Diagnosis not present

## 2017-05-11 ENCOUNTER — Encounter: Payer: Self-pay | Admitting: Family Medicine

## 2017-05-13 MED ORDER — RANITIDINE HCL 300 MG PO TABS: 300.0000 mg | ORAL_TABLET | Freq: Every day | ORAL | 3 refills | Status: DC

## 2017-05-23 DIAGNOSIS — G4733 Obstructive sleep apnea (adult) (pediatric): Secondary | ICD-10-CM | POA: Diagnosis not present

## 2017-05-24 ENCOUNTER — Ambulatory Visit (INDEPENDENT_AMBULATORY_CARE_PROVIDER_SITE_OTHER): Payer: Medicare Other | Admitting: Family Medicine

## 2017-05-24 ENCOUNTER — Encounter: Payer: Self-pay | Admitting: Family Medicine

## 2017-05-24 VITALS — BP 118/68 | HR 90 | Temp 98.1°F | Resp 18 | Wt 262.2 lb

## 2017-05-24 DIAGNOSIS — E785 Hyperlipidemia, unspecified: Secondary | ICD-10-CM | POA: Diagnosis not present

## 2017-05-24 DIAGNOSIS — K219 Gastro-esophageal reflux disease without esophagitis: Secondary | ICD-10-CM | POA: Diagnosis not present

## 2017-05-24 DIAGNOSIS — R739 Hyperglycemia, unspecified: Secondary | ICD-10-CM | POA: Diagnosis not present

## 2017-05-24 HISTORY — DX: Gastro-esophageal reflux disease without esophagitis: K21.9

## 2017-05-24 LAB — CBC WITH DIFFERENTIAL/PLATELET
Basophils Absolute: 0 10*3/uL (ref 0.0–0.1)
Basophils Relative: 0.6 % (ref 0.0–3.0)
EOS PCT: 6.1 % — AB (ref 0.0–5.0)
Eosinophils Absolute: 0.4 10*3/uL (ref 0.0–0.7)
HEMATOCRIT: 37.6 % (ref 36.0–46.0)
HEMOGLOBIN: 12.8 g/dL (ref 12.0–15.0)
LYMPHS ABS: 2.4 10*3/uL (ref 0.7–4.0)
Lymphocytes Relative: 40.8 % (ref 12.0–46.0)
MCHC: 34.1 g/dL (ref 30.0–36.0)
MCV: 90.4 fl (ref 78.0–100.0)
MONOS PCT: 5.7 % (ref 3.0–12.0)
Monocytes Absolute: 0.3 10*3/uL (ref 0.1–1.0)
Neutro Abs: 2.8 10*3/uL (ref 1.4–7.7)
Neutrophils Relative %: 46.8 % (ref 43.0–77.0)
Platelets: 281 10*3/uL (ref 150.0–400.0)
RBC: 4.15 Mil/uL (ref 3.87–5.11)
RDW: 13.9 % (ref 11.5–15.5)
WBC: 5.9 10*3/uL (ref 4.0–10.5)

## 2017-05-24 LAB — LIPID PANEL
Cholesterol: 119 mg/dL (ref 0–200)
HDL: 37.2 mg/dL — ABNORMAL LOW (ref >39.00)
LDL CALC: 60 mg/dL (ref 0–99)
NONHDL: 82.23
Total CHOL/HDL Ratio: 3
Triglycerides: 111 mg/dL (ref 0.0–149.0)
VLDL: 22.2 mg/dL (ref 0.0–40.0)

## 2017-05-24 LAB — COMPREHENSIVE METABOLIC PANEL
ALT: 24 U/L (ref 0–35)
AST: 31 U/L (ref 0–37)
Albumin: 4.1 g/dL (ref 3.5–5.2)
Alkaline Phosphatase: 43 U/L (ref 39–117)
BUN: 18 mg/dL (ref 6–23)
CO2: 30 meq/L (ref 19–32)
Calcium: 9.8 mg/dL (ref 8.4–10.5)
Chloride: 104 mEq/L (ref 96–112)
Creatinine, Ser: 0.88 mg/dL (ref 0.40–1.20)
GFR: 68 mL/min (ref >60.00)
GLUCOSE: 178 mg/dL — AB (ref 70–99)
POTASSIUM: 3.6 meq/L (ref 3.5–5.1)
Sodium: 140 mEq/L (ref 135–145)
Total Bilirubin: 0.5 mg/dL (ref 0.2–1.2)
Total Protein: 6.7 g/dL (ref 6.0–8.3)

## 2017-05-24 LAB — HEMOGLOBIN A1C: HEMOGLOBIN A1C: 6.6 % — AB (ref 4.6–6.5)

## 2017-05-24 LAB — H. PYLORI ANTIBODY, IGG: H PYLORI IGG: NEGATIVE

## 2017-05-24 MED ORDER — PANTOPRAZOLE SODIUM 40 MG PO TBEC: 40.0000 mg | DELAYED_RELEASE_TABLET | Freq: Every day | ORAL | 5 refills | Status: DC

## 2017-05-24 NOTE — Patient Instructions (Signed)
Food Choices for Gastroesophageal Reflux Disease, Adult When you have gastroesophageal reflux disease (GERD), the foods you eat and your eating habits are very important. Choosing the right foods can help ease your discomfort. What guidelines do I need to follow?  Choose fruits, vegetables, whole grains, and low-fat dairy products.  Choose low-fat meat, fish, and poultry.  Limit fats such as oils, salad dressings, butter, nuts, and avocado.  Keep a food diary. This helps you identify foods that cause symptoms.  Avoid foods that cause symptoms. These may be different for everyone.  Eat small meals often instead of 3 large meals a day.  Eat your meals slowly, in a place where you are relaxed.  Limit fried foods.  Cook foods using methods other than frying.  Avoid drinking alcohol.  Avoid drinking large amounts of liquids with your meals.  Avoid bending over or lying down until 2-3 hours after eating. What foods are not recommended? These are some foods and drinks that may make your symptoms worse: Vegetables  Tomatoes. Tomato juice. Tomato and spaghetti sauce. Chili peppers. Onion and garlic. Horseradish. Fruits  Oranges, grapefruit, and lemon (fruit and juice). Meats  High-fat meats, fish, and poultry. This includes hot dogs, ribs, ham, sausage, salami, and bacon. Dairy  Whole milk and chocolate milk. Sour cream. Cream. Butter. Ice cream. Cream cheese. Drinks  Coffee and tea. Bubbly (carbonated) drinks or energy drinks. Condiments  Hot sauce. Barbecue sauce. Sweets/Desserts  Chocolate and cocoa. Donuts. Peppermint and spearmint. Fats and Oils  High-fat foods. This includes French fries and potato chips. Other  Vinegar. Strong spices. This includes black pepper, white pepper, red pepper, cayenne, curry powder, cloves, ginger, and chili powder. The items listed above may not be a complete list of foods and drinks to avoid. Contact your dietitian for more information.    This information is not intended to replace advice given to you by your health care provider. Make sure you discuss any questions you have with your health care provider. Document Released: 08/07/2011 Document Revised: 07/14/2015 Document Reviewed: 12/10/2012 Elsevier Interactive Patient Education  2017 [employer] Inc.  

## 2017-05-24 NOTE — Progress Notes (Signed)
Subjective:  I acted as a Education administrator for Dr. Rosemary Holms, RMA  Patient ID: Amy Mahoney, female    DOB: [birth year], 67 y.o.   MRN: [id number]  Chief Complaint  Patient presents with  . Rash  . Gastroesophageal Reflux    HPI  Patient is in today for an acute visit for blisters around her mouth. She states it has been on going for about a month now.  She has been salivating a lot and wakes up with it all over her pillow.    + burping and in heartburn  Patient Care Team: Carollee Herter, Alferd Apa, DO as PCP - General Deneise Lever, MD as Consulting Physician (Pulmonary Disease) Determatology, [facility]   Past Medical History:  Diagnosis Date  . Depression   . Sleep apnea   . Spider bite   . Thyroid disease    hypothyroidism    Past Surgical History:  Procedure Laterality Date  . CHOLECYSTECTOMY    . DILATION AND CURETTAGE OF UTERUS    . fibroids scrapping    . TONSILLECTOMY      Family History  Problem Relation Age of Onset  . Diabetes Mother   . Depression Unknown   . Hyperlipidemia Unknown   . Arthritis Unknown   . Diabetes Maternal Aunt   . Breast cancer Paternal Grandmother   . Lung cancer Maternal Aunt        Nonsmoker  . Cancer Maternal Aunt        breast  . Breast cancer Maternal Aunt   . Cancer Maternal Aunt        lung(nonsmoker)  . Colon cancer Neg Hx   . Pancreatic cancer Neg Hx   . Stomach cancer Neg Hx     Social History   Socioeconomic History  . Marital status: Single    Spouse name: Not on file  . Number of children: Not on file  . Years of education: Not on file  . Highest education level: Not on file  Occupational History  . Occupation: Immunologist: Scientist, clinical (histocompatibility and immunogenetics): [employer]  Social Needs  . Financial resource strain: Not on file  . Food insecurity:    Worry: Not on file    Inability: Not on file  . Transportation needs:    Medical: Not on file    Non-medical: Not on file    Tobacco Use  . Smoking status: Former Smoker    Packs/day: 0.50    Years: 3.00    Pack years: 1.50    Types: Cigarettes    Last attempt to quit: 08/10/1990    Years since quitting: 26.8  . Smokeless tobacco: Never Used  Substance and Sexual Activity  . Alcohol use: Yes    Comment: rarely  . Drug use: No  . Sexual activity: Not Currently    Partners: Male  Lifestyle  . Physical activity:    Days per week: Not on file    Minutes per session: Not on file  . Stress: Not on file  Relationships  . Social connections:    Talks on phone: Not on file    Gets together: Not on file    Attends religious service: Not on file    Active member of club or organization: Not on file    Attends meetings of clubs or organizations: Not on file    Relationship status: Not on file  . Intimate partner violence:  Fear of current or ex partner: Not on file    Emotionally abused: Not on file    Physically abused: Not on file    Forced sexual activity: Not on file  Other Topics Concern  . Not on file  Social History Narrative   Regular exercise - yes-- 10,000 steps   Caffeine use: 2 cups of coffee daily    Outpatient Medications Prior to Visit  Medication Sig Dispense Refill  . atorvastatin (LIPITOR) 40 MG tablet TAKE 1 TABLET BY MOUTH  DAILY 90 tablet 1  . Calcium Carbonate-Vitamin D (CALCIUM 500 + D PO) Take 1 tablet by mouth 2 (two) times daily.      . fenofibrate 160 MG tablet TAKE 1 TABLET BY MOUTH  DAILY 90 tablet 1  . hydrochlorothiazide (HYDRODIURIL) 25 MG tablet TAKE 1 TABLET BY MOUTH  DAILY 90 tablet 1  . levothyroxine (SYNTHROID, LEVOTHROID) 100 MCG tablet Take 1 tablet (100 mcg total) by mouth daily before breakfast. 15 tablet 0  . levothyroxine (SYNTHROID, LEVOTHROID) 100 MCG tablet TAKE 1 TABLET BY MOUTH  DAILY BEFORE BREAKFAST 90 tablet 1  . Multiple Vitamin (MULTIVITAMIN) tablet Take 1 tablet by mouth daily.      [name] venlafaxine XR (EFFEXOR-XR) 150 MG 24 hr capsule TAKE 1  CAPSULE BY MOUTH  DAILY 90 capsule 1  . ranitidine (ZANTAC) 300 MG tablet Take 1 tablet (300 mg total) by mouth at bedtime. 90 tablet 3   No facility-administered medications prior to visit.     No Known Allergies  Review of Systems  Constitutional: Negative for chills, fever and malaise/fatigue.  HENT: Negative for congestion and hearing loss.   Eyes: Negative for discharge.  Respiratory: Negative for cough, sputum production and shortness of breath.   Cardiovascular: Negative for chest pain, palpitations and leg swelling.  Gastrointestinal: Positive for heartburn. Negative for abdominal pain, blood in stool, constipation, diarrhea, nausea and vomiting.  Genitourinary: Negative for dysuria, frequency, hematuria and urgency.  Musculoskeletal: Negative for back pain, falls and myalgias.  Skin: Negative for rash.  Neurological: Negative for dizziness, sensory change, loss of consciousness, weakness and headaches.  Endo/Heme/Allergies: Negative for environmental allergies. Does not bruise/bleed easily.  Psychiatric/Behavioral: Negative for depression and suicidal ideas. The patient is not nervous/anxious and does not have insomnia.        Objective:    Physical Exam  Constitutional: She is oriented to person, place, and time. She appears well-developed and well-nourished.  HENT:  Head: Normocephalic and atraumatic.    Eyes: Conjunctivae and EOM are normal.  Neck: Normal range of motion. Neck supple. No JVD present. Carotid bruit is not present. No thyromegaly present.  Cardiovascular: Normal rate, regular rhythm and normal heart sounds.  No murmur heard. Pulmonary/Chest: Effort normal and breath sounds normal. No respiratory distress. She has no wheezes. She has no rales. She exhibits no tenderness.  Abdominal: Soft. She exhibits no mass. There is no tenderness. There is no rebound and no guarding.  Musculoskeletal: She exhibits no edema.  Neurological: She is alert and oriented  to person, place, and time.  Skin: Rash noted.  Psychiatric: She has a normal mood and affect.  Nursing note and vitals reviewed.   BP 118/68 (BP Location: Left Arm, Patient Position: Sitting, Cuff Size: Large)   Pulse 90   Temp 98.1 F (36.7 C) (Oral)   Resp 18   Wt 262 lb 3.2 oz (118.9 kg)   SpO2 98%   BMI 42.32 kg/m  Wt Readings from  Last 3 Encounters:  05/24/17 262 lb 3.2 oz (118.9 kg)  02/28/17 262 lb (118.8 kg)  02/09/17 261 lb (118.4 kg)   BP Readings from Last 3 Encounters:  05/24/17 118/68  02/28/17 138/84  02/09/17 (!) 149/89     Immunization History  Administered Date(s) Administered  . Influenza Split 11/19/2012  . Influenza, High Dose Seasonal PF 12/14/2016  . Influenza,inj,Quad PF,6+ Mos 11/20/2015  . Influenza-Unspecified 12/16/2013  . Pneumococcal Conjugate-13 11/19/2014  . Pneumococcal Polysaccharide-23 10/30/2013  . Tdap 08/01/2010  . Zoster 08/01/2010    Health Maintenance  Topic Date Due  . INFLUENZA VACCINE  09/19/2017  . MAMMOGRAM  10/01/2017  . COLONOSCOPY  01/01/2018  . PNA vac Low Risk Adult (2 of 2 - PPSV23) 10/31/2018  . TETANUS/TDAP  07/31/2020  . DEXA SCAN  Completed  . Hepatitis C Screening  Completed    Lab Results  Component Value Date   WBC 5.9 05/24/2017   HGB 12.8 05/24/2017   HCT 37.6 05/24/2017   PLT 281.0 05/24/2017   GLUCOSE 178 (H) 05/24/2017   CHOL 119 05/24/2017   TRIG 111.0 05/24/2017   HDL 37.20 (L) 05/24/2017   LDLDIRECT 193.3 11/06/2012   LDLCALC 60 05/24/2017   ALT 24 05/24/2017   AST 31 05/24/2017   NA 140 05/24/2017   K 3.6 05/24/2017   CL 104 05/24/2017   CREATININE 0.88 05/24/2017   BUN 18 05/24/2017   CO2 30 05/24/2017   TSH 1.50 10/08/2016   HGBA1C 6.6 (H) 05/24/2017   MICROALBUR 1.4 09/12/2015    Lab Results  Component Value Date   TSH 1.50 10/08/2016   Lab Results  Component Value Date   WBC 5.9 05/24/2017   HGB 12.8 05/24/2017   HCT 37.6 05/24/2017   MCV 90.4 05/24/2017   PLT  281.0 05/24/2017   Lab Results  Component Value Date   NA 140 05/24/2017   K 3.6 05/24/2017   CO2 30 05/24/2017   GLUCOSE 178 (H) 05/24/2017   BUN 18 05/24/2017   CREATININE 0.88 05/24/2017   BILITOT 0.5 05/24/2017   ALKPHOS 43 05/24/2017   AST 31 05/24/2017   ALT 24 05/24/2017   PROT 6.7 05/24/2017   ALBUMIN 4.1 05/24/2017   CALCIUM 9.8 05/24/2017   GFR 68.00 05/24/2017   Lab Results  Component Value Date   CHOL 119 05/24/2017   Lab Results  Component Value Date   HDL 37.20 (L) 05/24/2017   Lab Results  Component Value Date   LDLCALC 60 05/24/2017   Lab Results  Component Value Date   TRIG 111.0 05/24/2017   Lab Results  Component Value Date   CHOLHDL 3 05/24/2017   Lab Results  Component Value Date   HGBA1C 6.6 (H) 05/24/2017         Assessment & Plan:   Problem List Items Addressed This Visit      Unprioritized   Gastroesophageal reflux disease - Primary    With water brash Start protonix Don't think zantac is the cause for the rash      Relevant Medications   pantoprazole (PROTONIX) 40 MG tablet   Other Relevant Orders   H. pylori antibody, IgG (Completed)   Comprehensive metabolic panel (Completed)   Hyperlipidemia LDL goal <100   Relevant Orders   CBC with Differential/Platelet (Completed)   Lipid panel (Completed)   Comprehensive metabolic panel (Completed)    Other Visit Diagnoses    Hyperglycemia       Relevant Orders  Hemoglobin A1c (Completed)   Comprehensive metabolic panel (Completed)      I have discontinued Gae Bon L. Michelini's ranitidine. I am also having her start on pantoprazole. Additionally, I am having her maintain her Calcium Carbonate-Vitamin D (CALCIUM 500 + D PO), multivitamin, levothyroxine, fenofibrate, hydrochlorothiazide, venlafaxine XR, levothyroxine, and atorvastatin.  Meds ordered this encounter  Medications  . pantoprazole (PROTONIX) 40 MG tablet    Sig: Take 1 tablet (40 mg total) by mouth daily.     Dispense:  30 tablet    Refill:  5    CMA served as scribe during this visit. History, Physical and Plan performed by medical provider. Documentation and orders reviewed and attested to.  [name], DO

## 2017-05-24 NOTE — Assessment & Plan Note (Signed)
With water brash Start protonix Don't think zantac is the cause for the rash

## 2017-06-05 ENCOUNTER — Encounter (HOSPITAL_COMMUNITY): Payer: Self-pay

## 2017-06-20 DIAGNOSIS — G4733 Obstructive sleep apnea (adult) (pediatric): Secondary | ICD-10-CM | POA: Diagnosis not present

## 2017-06-22 DIAGNOSIS — G4733 Obstructive sleep apnea (adult) (pediatric): Secondary | ICD-10-CM | POA: Diagnosis not present

## 2017-07-05 ENCOUNTER — Ambulatory Visit (INDEPENDENT_AMBULATORY_CARE_PROVIDER_SITE_OTHER): Payer: Medicare Other | Admitting: Family Medicine

## 2017-07-05 ENCOUNTER — Encounter: Payer: Self-pay | Admitting: Family Medicine

## 2017-07-05 VITALS — BP 116/59 | HR 86 | Resp 16 | Ht 66.0 in | Wt 257.8 lb

## 2017-07-05 DIAGNOSIS — K219 Gastro-esophageal reflux disease without esophagitis: Secondary | ICD-10-CM

## 2017-07-05 LAB — H. PYLORI ANTIBODY, IGG: H Pylori IgG: NEGATIVE

## 2017-07-05 MED ORDER — PANTOPRAZOLE SODIUM 40 MG PO TBEC: 40.0000 mg | DELAYED_RELEASE_TABLET | Freq: Two times a day (BID) | ORAL | 5 refills | Status: DC

## 2017-07-05 NOTE — Patient Instructions (Signed)
Food Choices for Gastroesophageal Reflux Disease, Adult When you have gastroesophageal reflux disease (GERD), the foods you eat and your eating habits are very important. Choosing the right foods can help ease the discomfort of GERD. Consider working with a diet and nutrition specialist (dietitian) to help you make healthy food choices. What general guidelines should I follow? Eating plan  Choose healthy foods low in fat, such as fruits, vegetables, whole grains, low-fat dairy products, and lean meat, fish, and poultry.  Eat frequent, small meals instead of three large meals each day. Eat your meals slowly, in a relaxed setting. Avoid bending over or lying down until 2-3 hours after eating.  Limit high-fat foods such as fatty meats or fried foods.  Limit your intake of oils, butter, and shortening to less than 8 teaspoons each day.  Avoid the following: ? Foods that cause symptoms. These may be different for different people. Keep a food diary to keep track of foods that cause symptoms. ? Alcohol. ? Drinking large amounts of liquid with meals. ? Eating meals during the 2-3 hours before bed.  Cook foods using methods other than frying. This may include baking, grilling, or broiling. Lifestyle   Maintain a healthy weight. Ask your health care provider what weight is healthy for you. If you need to lose weight, work with your health care provider to do so safely.  Exercise for at least 30 minutes on 5 or more days each week, or as told by your health care provider.  Avoid wearing clothes that fit tightly around your waist and chest.  Do not use any products that contain nicotine or tobacco, such as cigarettes and e-cigarettes. If you need help quitting, ask your health care provider.  Sleep with the head of your bed raised. Use a wedge under the mattress or blocks under the bed frame to raise the head of the bed. What foods are not recommended? The items listed may not be a complete  list. Talk with your dietitian about what dietary choices are best for you. Grains Pastries or quick breads with added fat. French toast. Vegetables Deep fried vegetables. French fries. Any vegetables prepared with added fat. Any vegetables that cause symptoms. For some people this may include tomatoes and tomato products, chili peppers, onions and garlic, and horseradish. Fruits Any fruits prepared with added fat. Any fruits that cause symptoms. For some people this may include citrus fruits, such as oranges, grapefruit, pineapple, and lemons. Meats and other protein foods High-fat meats, such as fatty beef or pork, hot dogs, ribs, ham, sausage, salami and bacon. Fried meat or protein, including fried fish and fried chicken. Nuts and nut butters. Dairy Whole milk and chocolate milk. Sour cream. Cream. Ice cream. Cream cheese. Milk shakes. Beverages Coffee and tea, with or without caffeine. Carbonated beverages. Sodas. Energy drinks. Fruit juice made with acidic fruits (such as orange or grapefruit). Tomato juice. Alcoholic drinks. Fats and oils Butter. Margarine. Shortening. Ghee. Sweets and desserts Chocolate and cocoa. Donuts. Seasoning and other foods Pepper. Peppermint and spearmint. Any condiments, herbs, or seasonings that cause symptoms. For some people, this may include curry, hot sauce, or vinegar-based salad dressings. Summary  When you have gastroesophageal reflux disease (GERD), food and lifestyle choices are very important to help ease the discomfort of GERD.  Eat frequent, small meals instead of three large meals each day. Eat your meals slowly, in a relaxed setting. Avoid bending over or lying down until 2-3 hours after eating.  Limit high-fat   foods such as fatty meat or fried foods. This information is not intended to replace advice given to you by your health care provider. Make sure you discuss any questions you have with your health care provider. Document Released:  02/05/2005 Document Revised: 02/07/2016 Document Reviewed: 02/07/2016 Elsevier Interactive Patient Education  2018 Elsevier Inc.  

## 2017-07-05 NOTE — Progress Notes (Signed)
Patient ID: Amy Mahoney, female    DOB: [birth year]  Age: 67 y.o. MRN: [id number]    Subjective:  Subjective  HPI Amy Mahoney presents for dyspepsia that is no better.  She is taking the protonix.  No other complaints See last ov as well  Review of Systems  Constitutional: Negative for activity change, appetite change, fatigue and unexpected weight change.  Respiratory: Negative for cough and shortness of breath.   Cardiovascular: Negative for chest pain and palpitations.  Gastrointestinal: Positive for abdominal pain. Negative for anal bleeding, blood in stool, constipation, diarrhea, nausea, rectal pain and vomiting.  Psychiatric/Behavioral: Negative for behavioral problems and dysphoric mood. The patient is not nervous/anxious.     History Past Medical History:  Diagnosis Date  . Depression   . Sleep apnea   . Spider bite   . Thyroid disease    hypothyroidism    She has a past surgical history that includes Cholecystectomy; Dilation and curettage of uterus; Tonsillectomy; and fibroids scrapping.   Her family history includes Arthritis in her unknown relative; Breast cancer in her maternal aunt and paternal grandmother; Cancer in her maternal aunt and maternal aunt; Depression in her unknown relative; Diabetes in her maternal aunt and mother; Hyperlipidemia in her unknown relative; Lung cancer in her maternal aunt.She reports that she quit smoking about 26 years ago. Her smoking use included cigarettes. She has a 1.50 pack-year smoking history. She has never used smokeless tobacco. She reports that she drinks alcohol. She reports that she does not use drugs.  Current Outpatient Medications on File Prior to Visit  Medication Sig Dispense Refill  . atorvastatin (LIPITOR) 40 MG tablet TAKE 1 TABLET BY MOUTH  DAILY 90 tablet 1  . Calcium Carbonate-Vitamin D (CALCIUM 500 + D PO) Take 1 tablet by mouth 2 (two) times daily.      . fenofibrate 160 MG tablet TAKE 1 TABLET BY MOUTH  DAILY  90 tablet 1  . hydrochlorothiazide (HYDRODIURIL) 25 MG tablet TAKE 1 TABLET BY MOUTH  DAILY 90 tablet 1  . levothyroxine (SYNTHROID, LEVOTHROID) 100 MCG tablet TAKE 1 TABLET BY MOUTH  DAILY BEFORE BREAKFAST 90 tablet 1  . Multiple Vitamin (MULTIVITAMIN) tablet Take 1 tablet by mouth daily.      [name] venlafaxine XR (EFFEXOR-XR) 150 MG 24 hr capsule TAKE 1 CAPSULE BY MOUTH  DAILY 90 capsule 1   No current facility-administered medications on file prior to visit.      Objective:  Objective  Physical Exam  Constitutional: She is oriented to person, place, and time. She appears well-developed and well-nourished.  HENT:  Head: Normocephalic and atraumatic.  Eyes: Conjunctivae and EOM are normal.  Neck: Normal range of motion. Neck supple. No JVD present. Carotid bruit is not present. No thyromegaly present.  Cardiovascular: Normal rate, regular rhythm and normal heart sounds.  No murmur heard. Pulmonary/Chest: Effort normal and breath sounds normal. No respiratory distress. She has no wheezes. She has no rales. She exhibits no tenderness.  Abdominal: Soft. Bowel sounds are normal. She exhibits no distension. There is no tenderness. There is no guarding.  Musculoskeletal: She exhibits no edema.  Neurological: She is alert and oriented to person, place, and time.  Psychiatric: She has a normal mood and affect.  Nursing note and vitals reviewed.  BP (!) 116/59 (BP Location: Right Arm, Patient Position: Sitting, Cuff Size: Large)   Pulse 86   Resp 16   Ht 5\' 6"  (1.676 m)   Wt 257  lb 12.8 oz (116.9 kg)   SpO2 100%   BMI 41.61 kg/m  Wt Readings from Last 3 Encounters:  07/05/17 257 lb 12.8 oz (116.9 kg)  05/24/17 262 lb 3.2 oz (118.9 kg)  02/28/17 262 lb (118.8 kg)     Lab Results  Component Value Date   WBC 5.9 05/24/2017   HGB 12.8 05/24/2017   HCT 37.6 05/24/2017   PLT 281.0 05/24/2017   GLUCOSE 178 (H) 05/24/2017   CHOL 119 05/24/2017   TRIG 111.0 05/24/2017   HDL 37.20 (L)  05/24/2017   LDLDIRECT 193.3 11/06/2012   LDLCALC 60 05/24/2017   ALT 24 05/24/2017   AST 31 05/24/2017   NA 140 05/24/2017   K 3.6 05/24/2017   CL 104 05/24/2017   CREATININE 0.88 05/24/2017   BUN 18 05/24/2017   CO2 30 05/24/2017   TSH 1.50 10/08/2016   HGBA1C 6.6 (H) 05/24/2017   MICROALBUR 1.4 09/12/2015    Dg Chest 2 View  Result Date: 02/09/2017 CLINICAL DATA:  Cough, fever. EXAM: CHEST  2 VIEW COMPARISON:  Radiographs of January 21, 2014. FINDINGS: The heart size and mediastinal contours are within normal limits. Both lungs are clear. No pneumothorax or pleural effusion is noted. The visualized skeletal structures are unremarkable. IMPRESSION: No active cardiopulmonary disease. Electronically Signed   By: Marijo Conception, M.D.   On: 02/09/2017 08:14     Assessment & Plan:  Plan  I have changed Leahann L. Nazzaro's pantoprazole. I am also having her maintain her Calcium Carbonate-Vitamin D (CALCIUM 500 + D PO), multivitamin, fenofibrate, hydrochlorothiazide, venlafaxine XR, levothyroxine, and atorvastatin.  Meds ordered this encounter  Medications  . pantoprazole (PROTONIX) 40 MG tablet    Sig: Take 1 tablet (40 mg total) by mouth 2 (two) times daily.    Dispense:  60 tablet    Refill:  5    Problem List Items Addressed This Visit      Unprioritized   Gastroesophageal reflux disease - Primary   Relevant Medications   pantoprazole (PROTONIX) 40 MG tablet   Other Relevant Orders   H. pylori antibody, IgG (Completed)   Ambulatory referral to Gastroenterology    inc protonix to bid and refer to GI   Follow-up: Return if symptoms worsen or fail to improve.  [name], DO

## 2017-07-11 ENCOUNTER — Ambulatory Visit: Payer: Medicare Other | Admitting: Internal Medicine

## 2017-07-11 ENCOUNTER — Encounter: Payer: Self-pay | Admitting: Internal Medicine

## 2017-07-11 DIAGNOSIS — K219 Gastro-esophageal reflux disease without esophagitis: Secondary | ICD-10-CM

## 2017-07-11 DIAGNOSIS — G4733 Obstructive sleep apnea (adult) (pediatric): Secondary | ICD-10-CM

## 2017-07-11 NOTE — Progress Notes (Signed)
HPI female former smoker followed for OSA, Cough, complicated by obesity, hypothyroid, GERD NPSG 08/03/94 RDI/AHI 49/hr, weight was 285 pounds  ---------------------------------------------------------------------------- 07/11/16- 67 year old female former smoker followed for OSA, Cough, complicated by obesity, hypothyroid, GERD CPAP 10/Advanced>> 12 today FOLLOW UP FOR 1 year follow DME is [facility] patient states that she has been doing well over the year she states that she is using  her CPAP about 6 to  hours a night  CPAP 10 per download indicates 100%/4 hour compliance but residual AHI of 12.1/hour. She notices she is waking a little more at night but not told that she snores. She think she sleeps okay and feels reasonably well rested. She had had a bronchopneumonia syndrome, probably viral, a couple of years ago. This has resolved completely.  07/11/2017- 67 year old female former smoker followed for OSA, Cough, complicated by obesity, hypothyroid, GERD CPAP auto 10-20/Advanced -----OSA; DME: [facility] Pt wears CPAP nightly and DL attached. Pressure works well and supplies needed.  Very pleased with CPAP which he uses every night-sleeps much better. Download 100% compliance AHI 8.9/hour which is unchanged from switch to AutoSet 10-20.  She is not bothered by snoring or daytime sleepiness. Pending GI follow-up for significant GERD.  ROS-see HPI   + = positive Constitutional:   No-   weight loss, night sweats, fevers, chills, fatigue, lassitude. HEENT:   No-  headaches, difficulty swallowing, tooth/dental problems, sore throat,       No-  sneezing, itching, ear ache, nasal congestion, post nasal drip,  CV:  No-   chest pain, orthopnea, PND, swelling in lower extremities, anasarca,  dizziness, palpitations Resp: No-   shortness of breath with exertion or at rest.               productive cough,   non-productive cough,  No- coughing up of blood.              No-   change in color of mucus.  No-  wheezing.   Skin: No-   rash or lesions. GI:  +heartburn, indigestion, abdominal pain, nausea, vomiting, GU: . MS:  No-   joint pain or swelling.   Neuro-     nothing unusual Psych:  No- change in mood or affect. No depression or anxiety.  No memory loss.  OBJ- Physical Exam General- Alert, Oriented, Affect-appropriate, Distress- none acute, + obese Skin- rash-none, lesions- none, excoriation- none Lymphadenopathy- none Head- atraumatic            Eyes- Gross vision intact, PERRLA, conjunctivae and secretions clear            Ears- Hearing, canals-normal            Nose- Clear, no-Septal dev, mucus, polyps, erosion, perforation             Throat- Mallampati III-IV , mucosa clear , drainage- none, tonsils- atrophic Neck- flexible , trachea midline, no stridor , thyroid nl, carotid no bruit Chest - symmetrical excursion , unlabored           Heart/CV- RRR , no murmur , no gallop  , no rub, nl s1 s2                           - JVD- none , edema- none, stasis changes- none, varices- none           Lung- clear, wheeze- none, cough- none , dullness-none, rub- none  Chest wall-  Abd-  Br/ Gen/ Rectal- Not done, not indicated Extrem- cyanosis- none, clubbing, none, atrophy- none, strength- nl Neuro- grossly intact to observation   .

## 2017-07-11 NOTE — Assessment & Plan Note (Signed)
Continues to benefit from CPAP with improved sleep quality and is very happy with her machine and settings.  Download confirms. Plan-continue auto 10-20

## 2017-07-11 NOTE — Patient Instructions (Signed)
We can continue CPAP auto 10-20, mask of choice, humidifier, supplies, AirView  Please call if we can help 

## 2017-07-11 NOTE — Assessment & Plan Note (Signed)
Reinforced importance of reflux precautions and control of GERD.  She is describing stricture symptoms now, with appointment to GI pending.

## 2017-07-23 DIAGNOSIS — G4733 Obstructive sleep apnea (adult) (pediatric): Secondary | ICD-10-CM | POA: Diagnosis not present

## 2017-07-24 ENCOUNTER — Encounter: Payer: Self-pay | Admitting: Internal Medicine

## 2017-07-24 ENCOUNTER — Encounter: Payer: Self-pay | Admitting: Physician Assistant

## 2017-07-24 ENCOUNTER — Ambulatory Visit: Payer: Medicare Other | Admitting: Physician Assistant

## 2017-07-24 VITALS — BP 110/76 | HR 84 | Ht 65.5 in | Wt 258.4 lb

## 2017-07-24 DIAGNOSIS — R1310 Dysphagia, unspecified: Secondary | ICD-10-CM | POA: Diagnosis not present

## 2017-07-24 DIAGNOSIS — K219 Gastro-esophageal reflux disease without esophagitis: Secondary | ICD-10-CM | POA: Diagnosis not present

## 2017-07-24 NOTE — Patient Instructions (Addendum)
Continue Protonix 40 mg, take 1 tab twice daily. We have provided you Antireflux information.  You will be due for a Colonoscopy 12/2017. You will get a letter prior to that.   You have been scheduled for an endoscopy. Please follow written instructions given to you at your visit today. If you use inhalers (even only as needed), please bring them with you on the day of your procedure. Your physician has requested that you go to www.[email or web address] and enter the access code given to you at your visit today. This web site gives a general overview about your procedure. However, you should still follow specific instructions given to you by our office regarding your preparation for the procedure.

## 2017-07-24 NOTE — Progress Notes (Addendum)
Subjective:    Patient ID: Amy Mahoney, female    DOB: May 18, 1950, 67 y.o.   MRN: [id number]  HPI [name] is a pleasant 67 year old white female, known to Dr. Hilarie Fredrickson from prior colonoscopy who is referred back today by Dr. Carollee Herter for evaluation of GERD and dysphasia. Patient was last seen in GI in November 2014 when she had colonoscopy.  She was found to have 2 5 mm polyps in the ascending colon, 1 of which was a tubular adenoma and the other hyperplastic.  She is indicated for 5-year interval follow-up. Other medical problems include morbid obesity, she status post remote sleeve gastrectomy and cholecystectomy, also has history of bronchitis depression and hypothyroidism. Patient has not had prior EGD. She states that the majority of her symptoms have been present over the past 6 months and gradually progressive.  She says she started noticing that she was waking up with burning type sensation the corners of her lips and also developed some little ulcers.  She was advised by her dentist that this may be related to acid reflux.  She had been started on Protonix 40 mg once daily a couple of months back.  And then on follow-up visit this was increased to twice daily.  She feels that the Protonix helps some but has certainly not alleviated her symptoms. Now over the past 3 to 4 weeks she says she is been having difficulty eating.  She says she has a difficult time with some of her pills feeling as if there is sticking.  Most of the time if she is drinking liquids slowly she has no dysphagia to liquids but with solid food is having symptoms almost every meal.  She says she will wait some of her meal, feel as if her food is still sitting in her esophagus.  She has to stop eating and either wait for the food to go down or at times has to regurgitate. She is also been having an ongoing discomfort in her subxiphoid area which she describes as a pressure type of sensation and fullness in the lower chest and  epigastrium.  Exline No regular aspirin or NSAIDs. Appetite has been fine, no significant weight loss.  She does mention that she is been trying to lose weight over the past several months.  Review of Systems Pertinent positive and negative review of systems were noted in the above HPI section.  All other review of systems was otherwise negative.  Outpatient Encounter Medications as of 07/24/2017  Medication Sig  . atorvastatin (LIPITOR) 40 MG tablet TAKE 1 TABLET BY MOUTH  DAILY  . Calcium Carbonate-Vitamin D (CALCIUM 500 + D PO) Take 1 tablet by mouth 2 (two) times daily.    . fenofibrate 160 MG tablet TAKE 1 TABLET BY MOUTH  DAILY  . hydrochlorothiazide (HYDRODIURIL) 25 MG tablet TAKE 1 TABLET BY MOUTH  DAILY  . levothyroxine (SYNTHROID, LEVOTHROID) 100 MCG tablet TAKE 1 TABLET BY MOUTH  DAILY BEFORE BREAKFAST  . Multiple Vitamin (MULTIVITAMIN) tablet Take 1 tablet by mouth daily.    . pantoprazole (PROTONIX) 40 MG tablet Take 1 tablet (40 mg total) by mouth 2 (two) times daily.  [name] venlafaxine XR (EFFEXOR-XR) 150 MG 24 hr capsule TAKE 1 CAPSULE BY MOUTH  DAILY   No facility-administered encounter medications on file as of 07/24/2017.    No Known Allergies Patient Active Problem List   Diagnosis Date Noted  . Gastroesophageal reflux disease 05/24/2017  . Pharyngitis 03/03/2017  .  Abscess of skin 04/15/2015  . Bacterial vaginal infection 12/17/2014  . Muscle spasm of back 11/19/2014  . Cough 09/17/2013  . Bronchitis with bronchospasm 04/14/2013  . Hyperlipidemia LDL goal <100 11/06/2012  . FATIGUE 06/01/2009  . MORBID OBESITY 03/30/2008  . DEPRESSION 09/05/2006  . Hypothyroidism 05/23/2006  . DIZZINESS, CHRONIC 05/23/2006  . Obstructive sleep apnea 05/23/2006   Social History   Socioeconomic History  . Marital status: Single    Spouse name: Not on file  . Number of children: Not on file  . Years of education: Not on file  . Highest education level: Not on file  Occupational  History  . Occupation: Immunologist: Scientist, clinical (histocompatibility and immunogenetics): [employer]  Social Needs  . Financial resource strain: Not on file  . Food insecurity:    Worry: Not on file    Inability: Not on file  . Transportation needs:    Medical: Not on file    Non-medical: Not on file  Tobacco Use  . Smoking status: Former Smoker    Packs/day: 0.50    Years: 3.00    Pack years: 1.50    Types: Cigarettes    Last attempt to quit: 08/10/1990    Years since quitting: 26.9  . Smokeless tobacco: Never Used  Substance and Sexual Activity  . Alcohol use: Yes    Comment: rarely  . Drug use: No  . Sexual activity: Not Currently    Partners: Male  Lifestyle  . Physical activity:    Days per week: Not on file    Minutes per session: Not on file  . Stress: Not on file  Relationships  . Social connections:    Talks on phone: Not on file    Gets together: Not on file    Attends religious service: Not on file    Active member of club or organization: Not on file    Attends meetings of clubs or organizations: Not on file    Relationship status: Not on file  . Intimate partner violence:    Fear of current or ex partner: Not on file    Emotionally abused: Not on file    Physically abused: Not on file    Forced sexual activity: Not on file  Other Topics Concern  . Not on file  Social History Narrative   Regular exercise - yes-- 10,000 steps   Caffeine use: 2 cups of coffee daily    Ms. Bourne's family history includes Arthritis in her unknown relative; Breast cancer in her maternal aunt and paternal grandmother; Cancer in her maternal aunt and maternal aunt; Depression in her unknown relative; Diabetes in her maternal aunt and mother; Hyperlipidemia in her unknown relative; Lung cancer in her maternal aunt.      Objective:    Vitals:   07/24/17 0832  BP: 110/76  Pulse: 84    Physical Exam; well-developed older white female in no acute distress, pleasant blood  pressure 110/76 pulse 84, height 5 foot 5, weight 258, BMI 42.3.  HEENT; nontraumatic normocephalic EOMI PERRLA sclera anicteric oropharynx clear, Cardiovascular; regular rate and rhythm with S1-S2.  Pulmonary; clear bilaterally, Abdomen ;obese soft nontender nondistended bowel sounds are active there is no palpable mass or hepatosplenomegaly.  She has a small incisional scar from prior sleeve gastrectomy, Extremities; no clubbing cyanosis or edema skin warm and dry, Neuro psych ;alert and oriented, grossly nonfocal mood and affect appropriate       Assessment &  Plan:   #92 67 year old white female with 21-month history of subxiphoid discomfort, intermittent acidic taste in her mouth and associated irritation/ulceration of her lips.  And now with 1 month history of solid food dysphasia which has become progressive and frequently requiring regurgitation  Rule out esophageal stricture, reflux esophagitis, less likely motility disorder or neoplasm.  #2 history of adenomatous colon polyps-up-to-date with colonoscopy last done November 2014 and due for follow-up November 2019 # 3 obesity patient is status post previous sleeve gastrectomy by her report #4.  Status post cholecystectomy #5.  Depression #6.  Hypothyroid  Plan; Continue Protonix 40 mg p.o. twice daily for now [facility] breakfast and before meals dinner Reviewed a strict antireflux regimen, and she was provided with an anti-reflux diet and instruction sheet Patient will be scheduled for upper endoscopy with probable esophageal dilation with Dr. Hilarie Fredrickson.  Procedure was discussed in detail with the patient including indications risks and benefits and she is agreeable to proceed. Patient was reminded that she will be due for follow-up colonoscopy in November 2019.  Amy S Esterwood PA-C 07/24/2017   Cc: [name], *   ,Addendum: Reviewed and agree with initial management. Pyrtle, Lajuan Lines, MD

## 2017-07-30 ENCOUNTER — Other Ambulatory Visit: Payer: Self-pay

## 2017-07-30 ENCOUNTER — Ambulatory Visit (AMBULATORY_SURGERY_CENTER): Payer: Medicare Other | Admitting: Internal Medicine

## 2017-07-30 ENCOUNTER — Ambulatory Visit (HOSPITAL_COMMUNITY): Payer: Medicare Other

## 2017-07-30 ENCOUNTER — Encounter: Payer: Self-pay | Admitting: Internal Medicine

## 2017-07-30 VITALS — BP 137/60 | HR 60 | Temp 97.8°F | Resp 18 | Ht 65.0 in | Wt 258.0 lb

## 2017-07-30 DIAGNOSIS — R1319 Other dysphagia: Secondary | ICD-10-CM

## 2017-07-30 DIAGNOSIS — R1310 Dysphagia, unspecified: Secondary | ICD-10-CM | POA: Diagnosis not present

## 2017-07-30 DIAGNOSIS — K219 Gastro-esophageal reflux disease without esophagitis: Secondary | ICD-10-CM | POA: Diagnosis present

## 2017-07-30 MED ORDER — SODIUM CHLORIDE 0.9 % IV SOLN: 500.0000 mL | Freq: Once | INTRAVENOUS | Status: DC

## 2017-07-30 NOTE — Patient Instructions (Addendum)
BARIUM ESOPHAGRAM IS SCHEDULED FOR TOMORROW, June 12,2019 AT [facility]. ARRIVE IN RADIOLOGY AT 09;45 PROCEDURE TIME IS 1000. NOTHING TO EAT OR DRINK AFTER MIDNIGHT.   MONITOR FOR FEVER, SHORTNESS OF BREATH OR INCREASED COUGHING. IF THIS OCCURS CALL DR. PYRTLE'S OFFICE IMMEDIATELY.   YOU HAD AN ENDOSCOPIC PROCEDURE TODAY AT [facility]:   Refer to the procedure report that was given to you for any specific questions about what was found during the examination.  If the procedure report does not answer your questions, please call your gastroenterologist to clarify.  If you requested that your care partner not be given the details of your procedure findings, then the procedure report has been included in a sealed envelope for you to review at your convenience later.  YOU SHOULD EXPECT: Some feelings of bloating in the abdomen. Passage of more gas than usual.  Walking can help get rid of the air that was put into your GI tract during the procedure and reduce the bloating. If you had a lower endoscopy (such as a colonoscopy or flexible sigmoidoscopy) you may notice spotting of blood in your stool or on the toilet paper. If you underwent a bowel prep for your procedure, you may not have a normal bowel movement for a few days.  Please Note:  You might notice some irritation and congestion in your nose or some drainage.  This is from the oxygen used during your procedure.  There is no need for concern and it should clear up in a day or so.  SYMPTOMS TO REPORT IMMEDIATELY:    Following upper endoscopy (EGD)  Vomiting of blood or coffee ground material  New chest pain or pain under the shoulder blades  Painful or persistently difficult swallowing  New shortness of breath  Fever of 100F or higher  Black, tarry-looking stools  For urgent or emergent issues, a gastroenterologist can be reached at any hour by calling [phone].   DIET:  SOFT DIET ONLY .   ACTIVITY:   You should plan to take it easy for the rest of today and you should NOT DRIVE or use heavy machinery until tomorrow (because of the sedation medicines used during the test).    FOLLOW UP: Our staff will call the number listed on your records the next business day following your procedure to check on you and address any questions or concerns that you may have regarding the information given to you following your procedure. If we do not reach you, we will leave a message.  However, if you are feeling well and you are not experiencing any problems, there is no need to return our call.  We will assume that you have returned to your regular daily activities without incident.  If any biopsies were taken you will be contacted by phone or by letter within the next 1-3 weeks.  Please call us at [phone] if you have not heard about the biopsies in 3 weeks.    SIGNATURES/CONFIDENTIALITY: You and/or your care partner have signed paperwork which will be entered into your electronic medical record.  These signatures attest to the fact that that the information above on your After Visit Summary has been reviewed and is understood.  Full responsibility of the confidentiality of this discharge information lies with you and/or your care-partner.

## 2017-07-30 NOTE — Progress Notes (Addendum)
Report received. Patient alert. Skin warm, dry and pink. Patient denies pain or dyspnea. T-97.8, continues on 02 2l/min. 0853 02 DCED. 0955PATIENT CONTINUES TO BE ASYMPTOMATIC, NO DYSPNEA , COUGH , SKIN IS WARM AND DRY. PATIENT SEEN AGAIN BY DR. PYRTLE, OK FOR DISCHARGE.

## 2017-07-30 NOTE — Op Note (Addendum)
[facility] Patient Name: Amy Mahoney Procedure Date: 07/30/2017 8:05 AM MRN: [id number] Endoscopist: Jerene Bears , MD Age: 67 Referring MD:  Date of Birth: Feb 09, 1951 Gender: Female Account #: [id number] Procedure:                Upper GI endoscopy Indications:              Epigastric abdominal pain, Dysphagia, Suspected                            esophageal reflux Medicines:                Monitored Anesthesia Care Procedure:                Pre-Anesthesia Assessment:                           - Prior to the procedure, a History and Physical                            was performed, and patient medications and                            allergies were reviewed. The patient's tolerance of                            previous anesthesia was also reviewed. The risks                            and benefits of the procedure and the sedation                            options and risks were discussed with the patient.                            All questions were answered, and informed consent                            was obtained. Prior Anticoagulants: The patient has                            taken no previous anticoagulant or antiplatelet                            agents. ASA Grade Assessment: II - A patient with                            mild systemic disease. After reviewing the risks                            and benefits, the patient was deemed in                            satisfactory condition to undergo the procedure.  After obtaining informed consent, the endoscope was                            passed under direct vision. Throughout the                            procedure, the patient's blood pressure, pulse, and                            oxygen saturations were monitored continuously. The                            Endoscope was introduced through the mouth, and                            advanced to the lower third of esophagus. The  upper                            GI endoscopy was accomplished without difficulty.                            The patient tolerated the procedure poorly due to                            the patient's respiratory instability (pulmonary                            aspiration). Scope In: Scope Out: Findings:                 Diffuse mild mucosal changes characterized by                            congestion and punctated white spots were found in                            the entire esophagus. Likely related to stasis and                            probable fungal esophagitis.                           The lumen of the upper third of the esophagus,                            middle third of the esophagus and lower third of                            the esophagus was moderately dilated and                            fluid-filled. There were particles of solid food.                            The examination  was very limited and the entire                            esophagus was not examined. The distal third was                            incompletely examined and the GE junction was not                            fully seen.                           In the left lateral position and before the upper                            endoscope was inserted there was yellow-brown fluid                            draining from the patient's mouth. This was                            suctioned.                           An examination of the stomach was not performed.                           An examination of the duodenum was not performed. Complications:            Possible aspiration Estimated Blood Loss:     Estimated blood loss: none. Impression:               - Dilation in the upper third of the esophagus, in                            the middle third of the esophagus and in the lower                            third of the esophagus.                           - Fluid-filled esophagus with  esophageal stasis.                           - Mild esophagitis likely from stasis with fungal                            component.                           - EGD incomplete. Recommendation:           - Patient has a contact number available for                            emergencies. The signs and symptoms of potential  delayed complications were discussed with the                            patient. Return to normal activities tomorrow.                            Written discharge instructions were provided to the                            patient.                           - Soft diet.                           - Continue present medications.                           - Barium esophagram ASAP.                           - Will likely need outpatient hospital EGD and                            manometry for suspected achalasia.                           - Monitor for fever, shortness of breath, increased                            coughing. If this occurs please call my office                            immediately. Low threshold for antibiotics given                            possible aspiration. Jerene Bears, MD 07/30/2017 8:37:26 AM This report has been signed electronically.

## 2017-07-30 NOTE — Progress Notes (Signed)
Following induction, pt had rapid desaturation accompanied by gastric-appearing secretions from mouth.  Pt's oropharynx was immediately suctioned. Exam was discontinued and pt's breathing was supported with 100% FiO2 via ambu. Pt maintained spontaneous respirations throughout and quickly recovered. Pt is resting comfortably. To PACU on 2L [place] with SpO2 >95%.  Report to RN.

## 2017-07-31 ENCOUNTER — Ambulatory Visit (HOSPITAL_COMMUNITY)
Admission: RE | Admit: 2017-07-31 | Discharge: 2017-07-31 | Disposition: A | Discharge status: Home / Self Care | Payer: Medicare Other | Source: Ambulatory Visit | Attending: Internal Medicine | Admitting: Internal Medicine

## 2017-07-31 ENCOUNTER — Telehealth: Payer: Self-pay

## 2017-07-31 DIAGNOSIS — Z9884 Bariatric surgery status: Secondary | ICD-10-CM | POA: Insufficient documentation

## 2017-07-31 DIAGNOSIS — R1310 Dysphagia, unspecified: Secondary | ICD-10-CM | POA: Diagnosis not present

## 2017-07-31 NOTE — Telephone Encounter (Signed)
  Follow up Call-  Call back number 07/30/2017  Post procedure Call Back phone  # [phone]  Permission to leave phone message Yes  Some recent data might be hidden     Patient questions:  Do you have a fever, pain , or abdominal swelling? No. Pain Score  0 *  Have you tolerated food without any problems? Yes.    Have you been able to return to your normal activities? Yes.    Do you have any questions about your discharge instructions: Diet   No. Medications  No. Follow up visit  No.  Do you have questions or concerns about your Care? No.  Actions: * If pain score is 4 or above: No action needed, pain <4.

## 2017-07-31 NOTE — Telephone Encounter (Signed)
  Follow up Call-  Call back number 07/30/2017  Post procedure Call Back phone  # [phone]  Permission to leave phone message Yes  Some recent data might be hidden     Left message

## 2017-08-01 ENCOUNTER — Encounter: Payer: Self-pay | Admitting: Internal Medicine

## 2017-08-01 ENCOUNTER — Telehealth: Payer: Self-pay | Admitting: Internal Medicine

## 2017-08-01 DIAGNOSIS — Z4651 Encounter for fitting and adjustment of gastric lap band: Secondary | ICD-10-CM | POA: Diagnosis not present

## 2017-08-01 DIAGNOSIS — Z9884 Bariatric surgery status: Secondary | ICD-10-CM | POA: Diagnosis not present

## 2017-08-01 DIAGNOSIS — R1310 Dysphagia, unspecified: Secondary | ICD-10-CM | POA: Diagnosis not present

## 2017-08-01 NOTE — Telephone Encounter (Signed)
See result note.  

## 2017-08-01 NOTE — Telephone Encounter (Signed)
Pt calling for barium swallow results. Please advise. 

## 2017-08-02 ENCOUNTER — Telehealth: Payer: Self-pay

## 2017-08-02 ENCOUNTER — Other Ambulatory Visit: Payer: Self-pay | Admitting: Family Medicine

## 2017-08-02 ENCOUNTER — Other Ambulatory Visit: Payer: Self-pay

## 2017-08-02 DIAGNOSIS — R1310 Dysphagia, unspecified: Secondary | ICD-10-CM

## 2017-08-02 DIAGNOSIS — I10 Essential (primary) hypertension: Secondary | ICD-10-CM

## 2017-08-02 DIAGNOSIS — E039 Hypothyroidism, unspecified: Secondary | ICD-10-CM

## 2017-08-02 DIAGNOSIS — Z1239 Encounter for other screening for malignant neoplasm of breast: Secondary | ICD-10-CM

## 2017-08-02 DIAGNOSIS — E785 Hyperlipidemia, unspecified: Secondary | ICD-10-CM

## 2017-08-02 DIAGNOSIS — Z0000 Encounter for general adult medical examination without abnormal findings: Secondary | ICD-10-CM

## 2017-08-02 MED ORDER — FLUCONAZOLE 100 MG PO TABS: ORAL_TABLET | ORAL | 0 refills | Status: DC

## 2017-08-02 NOTE — Telephone Encounter (Signed)
Pt. called to f/u request for mammogram appointment. Pt. unavailable. Per chart, pt. Has had every year recently, last 09/2016. Will retry patient later today. Mammogram order routed to Dr. Etter Sjogren for review.

## 2017-08-02 NOTE — Telephone Encounter (Signed)
Patient notified that she can just call to make her own appointments and she does not have to call for [name] to put order in.  Order placed anyway.

## 2017-08-02 NOTE — Telephone Encounter (Signed)
-----   Message from Jerene Bears, MD sent at 08/02/2017  8:32 AM EDT ----- LDH, I just sent the below to this patient Please contact her and arrange fluconazole 200 mg x 1 day, 100 mg x 9 days for esophagitis See if I can follow myself for her EGD at [facility] on 7/18 Thanks JMP   Amy Mahoney Please let me know early next week how your swallowing is doing, compared to before removing fluid from the lap-band. The motility in the esophagus hopefully will improve with the decreased pressure at the lap-band. The squeeze in the esophagus may (hopefully) will come back slowly, but at least there is less/no restriction at the bottom esophagus/top stomach with the band deflated. I would continue the pantoprazole 40 mg twice daily. I am going to empirically treat you for a yeast infection in your esophagus with fluconazole x 10 days. I recommendation we repeat the upper endoscopy (at [facility]) in about 1 month. Again, keep me updated and I hope that you will begin to improve quickly. Best Ulice Dash Pyrtle

## 2017-08-02 NOTE — Telephone Encounter (Signed)
Scripts sent to pharmacy for pt. Pt scheduled for EDG at [facility] 09/05/17@9 :15am. Left message for pt to call back.

## 2017-08-02 NOTE — Telephone Encounter (Signed)
Ok to order 

## 2017-08-08 ENCOUNTER — Encounter: Payer: Self-pay | Admitting: Internal Medicine

## 2017-08-24 ENCOUNTER — Encounter: Payer: Self-pay | Admitting: Internal Medicine

## 2017-09-02 ENCOUNTER — Encounter (HOSPITAL_COMMUNITY): Payer: Self-pay | Admitting: *Deleted

## 2017-09-03 ENCOUNTER — Encounter (HOSPITAL_COMMUNITY): Payer: Self-pay | Admitting: *Deleted

## 2017-09-05 ENCOUNTER — Ambulatory Visit (HOSPITAL_COMMUNITY): Payer: Medicare Other | Admitting: Anesthesiology

## 2017-09-05 ENCOUNTER — Ambulatory Visit (HOSPITAL_COMMUNITY)
Admission: RE | Admit: 2017-09-05 | Discharge: 2017-09-05 | Disposition: A | Discharge status: Home / Self Care | Payer: Medicare Other | Source: Ambulatory Visit | Attending: Internal Medicine | Admitting: Internal Medicine

## 2017-09-05 ENCOUNTER — Other Ambulatory Visit: Payer: Self-pay

## 2017-09-05 ENCOUNTER — Encounter (HOSPITAL_COMMUNITY)
Admission: RE | Disposition: A | Discharge status: Home / Self Care | Payer: Self-pay | Source: Ambulatory Visit | Attending: Internal Medicine

## 2017-09-05 ENCOUNTER — Encounter (HOSPITAL_COMMUNITY): Payer: Self-pay | Admitting: Anesthesiology

## 2017-09-05 DIAGNOSIS — F329 Major depressive disorder, single episode, unspecified: Secondary | ICD-10-CM | POA: Insufficient documentation

## 2017-09-05 DIAGNOSIS — G4730 Sleep apnea, unspecified: Secondary | ICD-10-CM | POA: Insufficient documentation

## 2017-09-05 DIAGNOSIS — Z9884 Bariatric surgery status: Secondary | ICD-10-CM | POA: Diagnosis not present

## 2017-09-05 DIAGNOSIS — R1310 Dysphagia, unspecified: Secondary | ICD-10-CM | POA: Diagnosis not present

## 2017-09-05 DIAGNOSIS — K219 Gastro-esophageal reflux disease without esophagitis: Secondary | ICD-10-CM

## 2017-09-05 DIAGNOSIS — R933 Abnormal findings on diagnostic imaging of other parts of digestive tract: Secondary | ICD-10-CM | POA: Diagnosis not present

## 2017-09-05 DIAGNOSIS — Z6841 Body Mass Index (BMI) 40.0 and over, adult: Secondary | ICD-10-CM | POA: Diagnosis not present

## 2017-09-05 DIAGNOSIS — E039 Hypothyroidism, unspecified: Secondary | ICD-10-CM | POA: Diagnosis not present

## 2017-09-05 DIAGNOSIS — Z87891 Personal history of nicotine dependence: Secondary | ICD-10-CM | POA: Diagnosis not present

## 2017-09-05 HISTORY — PX: ESOPHAGOGASTRODUODENOSCOPY (EGD) WITH PROPOFOL: SHX5813

## 2017-09-05 SURGERY — ESOPHAGOGASTRODUODENOSCOPY (EGD) WITH PROPOFOL
Anesthesia: Monitor Anesthesia Care

## 2017-09-05 MED ORDER — ONDANSETRON HCL 4 MG/2ML IJ SOLN
INTRAMUSCULAR | Status: DC | PRN
  Administered 2017-09-05: 4 mg via INTRAVENOUS

## 2017-09-05 MED ORDER — PROPOFOL 10 MG/ML IV BOLUS
INTRAVENOUS | Status: AC
  Filled 2017-09-05: qty 60

## 2017-09-05 MED ORDER — PROPOFOL 10 MG/ML IV BOLUS
INTRAVENOUS | Status: DC | PRN
  Administered 2017-09-05: 20 mg via INTRAVENOUS

## 2017-09-05 MED ORDER — PROPOFOL 500 MG/50ML IV EMUL
INTRAVENOUS | Status: DC | PRN
  Administered 2017-09-05: 120 ug/kg/min via INTRAVENOUS

## 2017-09-05 MED ORDER — LACTATED RINGERS IV SOLN
INTRAVENOUS | Status: DC
  Administered 2017-09-05: 08:00:00 via INTRAVENOUS

## 2017-09-05 MED ORDER — SODIUM CHLORIDE 0.9 % IV SOLN
INTRAVENOUS | Status: DC
Start: 2017-09-05 — End: 2017-09-05

## 2017-09-05 MED ORDER — PANTOPRAZOLE SODIUM 40 MG PO TBEC: 40.0000 mg | DELAYED_RELEASE_TABLET | Freq: Every day | ORAL | 5 refills | Status: DC

## 2017-09-05 MED ORDER — LIDOCAINE 2% (20 MG/ML) 5 ML SYRINGE
INTRAMUSCULAR | Status: DC | PRN
  Administered 2017-09-05: 100 mg via INTRAVENOUS

## 2017-09-05 SURGICAL SUPPLY — 14 items

## 2017-09-05 NOTE — Anesthesia Preprocedure Evaluation (Signed)
Anesthesia Evaluation  Patient identified by MRN, date of birth, ID band Patient awake    Reviewed: Allergy & Precautions, H&P , NPO status , Patient's Chart, lab work & pertinent test results  Airway Mallampati: II   Neck ROM: full    Dental   Pulmonary sleep apnea , former smoker,    breath sounds clear to auscultation       Cardiovascular negative cardio ROS   Rhythm:regular Rate:Normal     Neuro/Psych PSYCHIATRIC DISORDERS Depression    GI/Hepatic GERD  ,  Endo/Other  Hypothyroidism Morbid obesity  Renal/GU      Musculoskeletal   Abdominal   Peds  Hematology   Anesthesia Other Findings   Reproductive/Obstetrics                             Anesthesia Physical Anesthesia Plan  ASA: II  Anesthesia Plan: MAC   Post-op Pain Management:    Induction: Intravenous  PONV Risk Score and Plan: 2 and Ondansetron, Propofol infusion and Treatment may vary due to age or medical condition  Airway Management Planned: Nasal Cannula  Additional Equipment:   Intra-op Plan:   Post-operative Plan:   Informed Consent: I have reviewed the patients History and Physical, chart, labs and discussed the procedure including the risks, benefits and alternatives for the proposed anesthesia with the patient or authorized representative who has indicated his/her understanding and acceptance.     Plan Discussed with: Anesthesiologist, CRNA and Surgeon  Anesthesia Plan Comments:         Anesthesia Quick Evaluation

## 2017-09-05 NOTE — Op Note (Signed)
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 Hilarie Fredrickson, MD, Elmer Ramp. Tilden Dome, RN, Tinnie Gens, Technician, Danley Danker, CRNA Referring MD:             Rosalita Chessman Medicines:                Monitored Anesthesia Care Complications:            No immediate complications. Estimated Blood Loss:     Estimated blood loss: none. Procedure:                Pre-Anesthesia Assessment:                           - Prior to the procedure, a History and Physical                            was performed, and patient medications and                            allergies were reviewed. The patient's tolerance of                            previous anesthesia was also reviewed. The risks                            and benefits of the procedure and the sedation                            options and risks were discussed with the patient.                            All questions were answered, and informed consent                            was obtained. Prior Anticoagulants: The patient has                            taken no previous anticoagulant or antiplatelet  agents. ASA Grade Assessment: III - A patient with                            severe systemic disease. After reviewing the risks                            and benefits, the patient was deemed in                            satisfactory condition to undergo the procedure.                           After obtaining informed consent, the endoscope was                         passed under direct vision. Throughout the                            procedure, the patient's blood pressure, pulse, and                            oxygen saturations were monitored continuously. The                            EG-[id number] ([id number]) scope was introduced through the                            mouth, and advanced to the second part of duodenum.                            The upper GI endoscopy was accomplished without                            difficulty. The patient tolerated the procedure                            well. Scope In: Scope Out: Findings:      The examined esophagus was normal. Previously seen esophageal dilation       has resolved.      Evidence of a banded gastroplasty was found in the gastric cardia.       Appearance unremarkable and this is no longer causing a restriction.      The exam of the stomach was otherwise normal.      The examined duodenum was normal. Impression:               - Normal esophagus.                           - A banded gastroplasty was found, characterized by                            healthy appearing mucosa.                           - Normal examined duodenum.                           -  Images were not obtained due to image capture                            malfunction.                           - No specimens collected. Moderate Sedation:      N/A Recommendation:           - Patient has a contact number available for                            emergencies. The signs and symptoms of potential                            delayed complications were discussed with the                            patient. Return to normal activities tomorrow.                            Written discharge instructions were provided to the                            patient.                           - Resume previous diet.                           - Continue present medications.                           - If problems with  reflux or swallowing return                            please contact me. Procedure Code(s):        --- Professional ---                           [phone], Esophagogastroduodenoscopy, flexible,                            transoral; diagnostic, including collection of                            specimen(s) by brushing or washing, when performed                            (separate procedure) Diagnosis Code(s):        --- Professional ---                           Z12.45, Bariatric surgery status                           R13.10, Dysphagia, unspecified  R93.3, Abnormal findings on diagnostic imaging of                            other parts of digestive tract CPT copyright 2017 [employer]. All rights reserved. The codes documented in this report are preliminary and upon coder review may  be revised to meet current compliance requirements. Jerene Bears, MD 09/05/2017 9:55:52 AM This report has been signed electronically. Number of Addenda: 0

## 2017-09-05 NOTE — H&P (Signed)
HPI: Amy Mahoney returns today for EGD.  All dysphagia symptoms better after deflating the cuff on her lap band.  This was causing a pseudo-achalasia. No heartburn, dysphagia, regurg, odynophagia. No complaints today.  Past Medical History:  Diagnosis Date  . Depression   . Sleep apnea   . Spider bite   . Thyroid disease    hypothyroidism    Past Surgical History:  Procedure Laterality Date  . CHOLECYSTECTOMY    . DILATION AND CURETTAGE OF UTERUS    . fibroids scrapping    . TONSILLECTOMY       (Not in an outpatient encounter)  No Known Allergies  Family History  Problem Relation Age of Onset  . Diabetes Mother   . Depression Unknown   . Hyperlipidemia Unknown   . Arthritis Unknown   . Diabetes Maternal Aunt   . Breast cancer Paternal Grandmother   . Lung cancer Maternal Aunt        Nonsmoker  . Cancer Maternal Aunt        breast  . Breast cancer Maternal Aunt   . Cancer Maternal Aunt        lung(nonsmoker)  . Colon cancer Neg Hx   . Pancreatic cancer Neg Hx   . Stomach cancer Neg Hx     Social History   Tobacco Use  . Smoking status: Former Smoker    Packs/day: 0.50    Years: 3.00    Pack years: 1.50    Types: Cigarettes    Last attempt to quit: 08/10/1990    Years since quitting: 27.0  . Smokeless tobacco: Never Used  Substance Use Topics  . Alcohol use: Yes    Comment: rarely  . Drug use: No    ROS: As per history of present illness, otherwise negative  BP (!) 146/64   Pulse 68   Temp 98.1 F (36.7 C) (Oral)   Resp 15   Ht 5' 5"  (1.651 m)   Wt 258 lb (117 kg)   SpO2 98%   BMI 42.93 kg/m  Gen: awake, alert, NAD HEENT: anicteric, op clear CV: RRR, no mrg Pulm: CTA b/l Abd: soft, NT/ND, +BS throughout Ext: no c/c/e Neuro: nonfocal   RELEVANT LABS AND IMAGING: CBC    Component Value Date/Time   WBC 5.9 05/24/2017 1358   RBC 4.15 05/24/2017 1358   HGB 12.8 05/24/2017 1358   HCT 37.6 05/24/2017 1358   PLT 281.0 05/24/2017 1358    MCV 90.4 05/24/2017 1358   MCH 30.6 12/26/2013 1540   MCHC 34.1 05/24/2017 1358   RDW 13.9 05/24/2017 1358   LYMPHSABS 2.4 05/24/2017 1358   MONOABS 0.3 05/24/2017 1358   EOSABS 0.4 05/24/2017 1358   BASOSABS 0.0 05/24/2017 1358    CMP     Component Value Date/Time   NA 140 05/24/2017 1358   NA 141 12/05/2013   K 3.6 05/24/2017 1358   CL 104 05/24/2017 1358   CO2 30 05/24/2017 1358   GLUCOSE 178 (H) 05/24/2017 1358   BUN 18 05/24/2017 1358   BUN 15 12/05/2013   CREATININE 0.88 05/24/2017 1358   CALCIUM 9.8 05/24/2017 1358   PROT 6.7 05/24/2017 1358   ALBUMIN 4.1 05/24/2017 1358   AST 31 05/24/2017 1358   ALT 24 05/24/2017 1358   ALKPHOS 43 05/24/2017 1358   BILITOT 0.5 05/24/2017 1358   GFRNONAA 108.53 06/01/2009 1203   GFRAA  10/27/2008 0910    >60        The  eGFR has been calculated using the MDRD equation. This calculation has not been validated in all clinical situations. eGFR's persistently <60 mL/min signify possible Chronic Kidney Disease.    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 Incomplete EGD last month The nature of the procedure, as well as the risks, benefits, and alternatives were carefully and thoroughly reviewed with the patient. Ample time for discussion and questions allowed. The patient understood, was satisfied, and agreed to proceed.

## 2017-09-05 NOTE — Anesthesia Postprocedure Evaluation (Signed)
Anesthesia Post Note  Patient: Amy Mahoney  Procedure(s) Performed: ESOPHAGOGASTRODUODENOSCOPY (EGD) WITH PROPOFOL (N/A )     Patient location during evaluation: PACU Anesthesia Type: MAC Level of consciousness: awake and alert Pain management: pain level controlled Vital Signs Assessment: post-procedure vital signs reviewed and stable Respiratory status: spontaneous breathing, nonlabored ventilation, respiratory function stable and patient connected to nasal cannula oxygen Cardiovascular status: stable and blood pressure returned to baseline Postop Assessment: no apparent nausea or vomiting Anesthetic complications: no    Last Vitals:  Vitals:   09/05/17 0944 09/05/17 0954  BP: (!) 141/69   Pulse: 68 65  Resp: 12 14  Temp: 36.4 C   SpO2: 97% 97%    Last Pain:  Vitals:   09/05/17 0944  TempSrc: Oral  PainSc: 0-No pain                 Jamarea Selner S

## 2017-09-05 NOTE — Discharge Instructions (Signed)
YOU HAD AN ENDOSCOPIC PROCEDURE TODAY: Refer to the procedure report and other information in the discharge instructions given to you for any specific questions about what was found during the examination. If this information does not answer your questions, please call [place] office at [phone] to clarify.  ° °YOU SHOULD EXPECT: Some feelings of bloating in the abdomen. Passage of more gas than usual. Walking can help get rid of the air that was put into your GI tract during the procedure and reduce the bloating. If you had a lower endoscopy (such as a colonoscopy or flexible sigmoidoscopy) you may notice spotting of blood in your stool or on the toilet paper. Some abdominal soreness may be present for a day or two, also. ° °DIET: Your first meal following the procedure should be a light meal and then it is ok to progress to your normal diet. A half-sandwich or bowl of soup is an example of a good first meal. Heavy or fried foods are harder to digest and may make you feel nauseous or bloated. Drink plenty of fluids but you should avoid alcoholic beverages for 24 hours. If you had a esophageal dilation, please see attached instructions for diet.   ° °ACTIVITY: Your care partner should take you home directly after the procedure. You should plan to take it easy, moving slowly for the rest of the day. You can resume normal activity the day after the procedure however YOU SHOULD NOT DRIVE, use power tools, machinery or perform tasks that involve climbing or major physical exertion for 24 hours (because of the sedation medicines used during the test).  ° °SYMPTOMS TO REPORT IMMEDIATELY: °A gastroenterologist can be reached at any hour. Please call [phone]  for any of the following symptoms:  °Following lower endoscopy (colonoscopy, flexible sigmoidoscopy) °Excessive amounts of blood in the stool  °Significant tenderness, worsening of abdominal pains  °Swelling of the abdomen that is new, acute  °Fever of 100° or  higher  °Following upper endoscopy (EGD, EUS, ERCP, esophageal dilation) °Vomiting of blood or coffee ground material  °New, significant abdominal pain  °New, significant chest pain or pain under the shoulder blades  °Painful or persistently difficult swallowing  °New shortness of breath  °Black, tarry-looking or red, bloody stools ° °FOLLOW UP:  °If any biopsies were taken you will be contacted by phone or by letter within the next 1-3 weeks. Call [phone]  if you have not heard about the biopsies in 3 weeks.  °Please also call with any specific questions about appointments or follow up tests. ° °

## 2017-09-05 NOTE — Transfer of Care (Signed)
Immediate Anesthesia Transfer of Care Note  Patient: Amy Mahoney  Procedure(s) Performed: ESOPHAGOGASTRODUODENOSCOPY (EGD) WITH PROPOFOL (N/A )  Patient Location: Endoscopy Unit  Anesthesia Type:MAC  Level of Consciousness: awake, alert  and oriented  Airway & Oxygen Therapy: Patient Spontanous Breathing and Patient connected to nasal cannula oxygen  Post-op Assessment: Report given to RN and Post -op Vital signs reviewed and stable  Post vital signs: Reviewed and stable  Last Vitals:  Vitals Value Taken Time  BP    Temp    Pulse    Resp    SpO2      Last Pain:  Vitals:   09/05/17 0800  TempSrc: Oral  PainSc: 0-No pain         Complications: No apparent anesthesia complications

## 2017-09-06 ENCOUNTER — Encounter (HOSPITAL_COMMUNITY): Payer: Self-pay | Admitting: Internal Medicine

## 2017-09-13 DIAGNOSIS — Z9884 Bariatric surgery status: Secondary | ICD-10-CM | POA: Diagnosis not present

## 2017-10-04 ENCOUNTER — Encounter: Payer: Self-pay | Admitting: *Deleted

## 2017-10-17 ENCOUNTER — Ambulatory Visit (INDEPENDENT_AMBULATORY_CARE_PROVIDER_SITE_OTHER): Payer: Medicare Other | Admitting: Family Medicine

## 2017-10-17 ENCOUNTER — Encounter (HOSPITAL_BASED_OUTPATIENT_CLINIC_OR_DEPARTMENT_OTHER): Payer: Self-pay

## 2017-10-17 ENCOUNTER — Encounter: Payer: Self-pay | Admitting: Family Medicine

## 2017-10-17 ENCOUNTER — Ambulatory Visit (HOSPITAL_BASED_OUTPATIENT_CLINIC_OR_DEPARTMENT_OTHER)
Admission: RE | Admit: 2017-10-17 | Discharge: 2017-10-17 | Disposition: A | Discharge status: Home / Self Care | Payer: Medicare Other | Source: Ambulatory Visit | Attending: Family Medicine | Admitting: Family Medicine

## 2017-10-17 VITALS — BP 120/56 | HR 72 | Temp 98.4°F | Resp 16 | Ht 64.5 in | Wt 267.6 lb

## 2017-10-17 DIAGNOSIS — E1165 Type 2 diabetes mellitus with hyperglycemia: Secondary | ICD-10-CM | POA: Diagnosis not present

## 2017-10-17 DIAGNOSIS — K219 Gastro-esophageal reflux disease without esophagitis: Secondary | ICD-10-CM

## 2017-10-17 DIAGNOSIS — E785 Hyperlipidemia, unspecified: Secondary | ICD-10-CM | POA: Diagnosis not present

## 2017-10-17 DIAGNOSIS — E1169 Type 2 diabetes mellitus with other specified complication: Secondary | ICD-10-CM

## 2017-10-17 DIAGNOSIS — R739 Hyperglycemia, unspecified: Secondary | ICD-10-CM | POA: Diagnosis not present

## 2017-10-17 DIAGNOSIS — Z1231 Encounter for screening mammogram for malignant neoplasm of breast: Secondary | ICD-10-CM | POA: Insufficient documentation

## 2017-10-17 DIAGNOSIS — E039 Hypothyroidism, unspecified: Secondary | ICD-10-CM

## 2017-10-17 DIAGNOSIS — Z1239 Encounter for other screening for malignant neoplasm of breast: Secondary | ICD-10-CM

## 2017-10-17 LAB — CBC WITH DIFFERENTIAL/PLATELET
BASOS ABS: 0.1 10*3/uL (ref 0.0–0.1)
Basophils Relative: 0.9 % (ref 0.0–3.0)
Eosinophils Absolute: 0.4 10*3/uL (ref 0.0–0.7)
Eosinophils Relative: 6.4 % — ABNORMAL HIGH (ref 0.0–5.0)
HCT: 38 % (ref 36.0–46.0)
Hemoglobin: 12.7 g/dL (ref 12.0–15.0)
LYMPHS ABS: 2.3 10*3/uL (ref 0.7–4.0)
Lymphocytes Relative: 36.8 % (ref 12.0–46.0)
MCHC: 33.4 g/dL (ref 30.0–36.0)
MCV: 89.7 fl (ref 78.0–100.0)
MONO ABS: 0.5 10*3/uL (ref 0.1–1.0)
Monocytes Relative: 7.8 % (ref 3.0–12.0)
NEUTROS PCT: 48.1 % (ref 43.0–77.0)
Neutro Abs: 2.9 10*3/uL (ref 1.4–7.7)
Platelets: 281 10*3/uL (ref 150.0–400.0)
RBC: 4.24 Mil/uL (ref 3.87–5.11)
RDW: 13.9 % (ref 11.5–15.5)
WBC: 6.1 10*3/uL (ref 4.0–10.5)

## 2017-10-17 LAB — COMPREHENSIVE METABOLIC PANEL
ALK PHOS: 50 U/L (ref 39–117)
ALT: 21 U/L (ref 0–35)
AST: 29 U/L (ref 0–37)
Albumin: 4.2 g/dL (ref 3.5–5.2)
BUN: 18 mg/dL (ref 6–23)
CO2: 32 mEq/L (ref 19–32)
CREATININE: 0.94 mg/dL (ref 0.40–1.20)
Calcium: 9.5 mg/dL (ref 8.4–10.5)
Chloride: 104 mEq/L (ref 96–112)
GFR: 62.94 mL/min (ref >60.00)
GLUCOSE: 150 mg/dL — AB (ref 70–99)
POTASSIUM: 4 meq/L (ref 3.5–5.1)
SODIUM: 142 meq/L (ref 135–145)
TOTAL PROTEIN: 6.5 g/dL (ref 6.0–8.3)
Total Bilirubin: 0.4 mg/dL (ref 0.2–1.2)

## 2017-10-17 LAB — LIPID PANEL
Cholesterol: 121 mg/dL (ref 0–200)
HDL: 35.9 mg/dL — ABNORMAL LOW (ref >39.00)
LDL Cholesterol: 52 mg/dL (ref 0–99)
NONHDL: 85.19
Total CHOL/HDL Ratio: 3
Triglycerides: 165 mg/dL — ABNORMAL HIGH (ref 0.0–149.0)
VLDL: 33 mg/dL (ref 0.0–40.0)

## 2017-10-17 LAB — HEMOGLOBIN A1C: HEMOGLOBIN A1C: 7.4 % — AB (ref 4.6–6.5)

## 2017-10-17 LAB — TSH: TSH: 2.21 u[IU]/mL (ref 0.35–4.50)

## 2017-10-17 NOTE — Progress Notes (Signed)
Subjective:     Amy Mahoney is a 67 y.o. female and is here for a comprehensive physical exam. The patient reports no problems.  Social History   Socioeconomic History  . Marital status: Single    Spouse name: Not on file  . Number of children: Not on file  . Years of education: Not on file  . Highest education level: Not on file  Occupational History  . Occupation: Immunologist: Scientist, clinical (histocompatibility and immunogenetics): [employer]  Social Needs  . Financial resource strain: Not on file  . Food insecurity:    Worry: Not on file    Inability: Not on file  . Transportation needs:    Medical: Not on file    Non-medical: Not on file  Tobacco Use  . Smoking status: Former Smoker    Packs/day: 0.50    Years: 3.00    Pack years: 1.50    Types: Cigarettes    Last attempt to quit: 08/10/1990    Years since quitting: 27.2  . Smokeless tobacco: Never Used  Substance and Sexual Activity  . Alcohol use: Yes    Comment: rarely  . Drug use: No  . Sexual activity: Not Currently    Partners: Male  Lifestyle  . Physical activity:    Days per week: Not on file    Minutes per session: Not on file  . Stress: Not on file  Relationships  . Social connections:    Talks on phone: Not on file    Gets together: Not on file    Attends religious service: Not on file    Active member of club or organization: Not on file    Attends meetings of clubs or organizations: Not on file    Relationship status: Not on file  . Intimate partner violence:    Fear of current or ex partner: Not on file    Emotionally abused: Not on file    Physically abused: Not on file    Forced sexual activity: Not on file  Other Topics Concern  . Not on file  Social History Narrative   Regular exercise - yes-- 10,000 steps   Caffeine use: 2 cups of coffee daily   Health Maintenance  Topic Date Due  . MAMMOGRAM  10/01/2017  . INFLUENZA VACCINE  09/19/2017  . COLONOSCOPY  01/01/2018  . PNA vac Low  Risk Adult (2 of 2 - PPSV23) 10/31/2018  . TETANUS/TDAP  07/31/2020  . DEXA SCAN  Completed  . Hepatitis C Screening  Completed    The following portions of the patient's history were reviewed and updated as appropriate:  She  has a past medical history of Depression, Sleep apnea, Spider bite, and Thyroid disease. She does not have any pertinent problems on file. She  has a past surgical history that includes Cholecystectomy; Dilation and curettage of uterus; Tonsillectomy; fibroids scrapping; and Esophagogastroduodenoscopy (egd) with propofol (N/A, 09/05/2017). Her family history includes Arthritis in her unknown relative; Breast cancer in her maternal aunt and paternal grandmother; Cancer in her maternal aunt and maternal aunt; Depression in her unknown relative; Diabetes in her maternal aunt and mother; Hyperlipidemia in her unknown relative; Lung cancer in her maternal aunt. She  reports that she quit smoking about 27 years ago. Her smoking use included cigarettes. She has a 1.50 pack-year smoking history. She has never used smokeless tobacco. She reports that she drinks alcohol. She reports that she does not use drugs.  She has a current medication list which includes the following prescription(s): atorvastatin, calcium carbonate-vitamin d, fenofibrate, fluconazole, hydrochlorothiazide, hydroxypropyl methylcellulose / hypromellose, levothyroxine, multivitamin, pantoprazole, venlafaxine xr, and vitamin e. Current Outpatient Medications on File Prior to Visit  Medication Sig Dispense Refill  . atorvastatin (LIPITOR) 40 MG tablet TAKE 1 TABLET BY MOUTH  DAILY 90 tablet 1  . Calcium Carbonate-Vitamin D (CALCIUM 500 + D PO) Take 1 tablet by mouth 2 (two) times daily.      . fenofibrate 160 MG tablet TAKE 1 TABLET BY MOUTH  DAILY 90 tablet 1  . fluconazole (DIFLUCAN) 100 MG tablet Take 2 tablets by mouth day one and then one tablet daily until gone 11 tablet 0  . hydrochlorothiazide (HYDRODIURIL) 25  MG tablet TAKE 1 TABLET BY MOUTH  DAILY (Patient taking differently: TAKE 1 TABLET BY MOUTH  DAILY AS NEEDED FOR SWELLING) 90 tablet 1  . hydroxypropyl methylcellulose / hypromellose (ISOPTO TEARS / GONIOVISC) 2.5 % ophthalmic solution Place 1 drop into both eyes daily as needed for dry eyes.    [name] levothyroxine (SYNTHROID, LEVOTHROID) 100 MCG tablet TAKE 1 TABLET BY MOUTH  DAILY BEFORE BREAKFAST 90 tablet 1  . Multiple Vitamin (MULTIVITAMIN) tablet Take 1 tablet by mouth daily.      . pantoprazole (PROTONIX) 40 MG tablet Take 1 tablet (40 mg total) by mouth daily. 60 tablet 5  . venlafaxine XR (EFFEXOR-XR) 150 MG 24 hr capsule TAKE 1 CAPSULE BY MOUTH  DAILY 90 capsule 1  . vitamin E 400 UNIT capsule Take 400 Units by mouth daily.     No current facility-administered medications on file prior to visit.    She has No Known Allergies..  Review of Systems Review of Systems  Constitutional: Negative for activity change, appetite change and fatigue.  HENT: Negative for hearing loss, congestion, tinnitus and ear discharge.  dentist q42m Eyes: Negative for visual disturbance (see optho q1y -- vision corrected to 20/20 with glasses).  Respiratory: Negative for cough, chest tightness and shortness of breath.   Cardiovascular: Negative for chest pain, palpitations and leg swelling.  Gastrointestinal: Negative for abdominal pain, diarrhea, constipation and abdominal distention.  Genitourinary: Negative for urgency, frequency, decreased urine volume and difficulty urinating.  Musculoskeletal: Negative for back pain, arthralgias and gait problem.  Skin: Negative for color change, pallor and rash.  Neurological: Negative for dizziness, light-headedness, numbness and headaches.  Hematological: Negative for adenopathy. Does not bruise/bleed easily.  Psychiatric/Behavioral: Negative for suicidal ideas, confusion, sleep disturbance, self-injury, dysphoric mood, decreased concentration and agitation.        Objective:    BP (!) 120/56   Pulse 72   Temp 98.4 F (36.9 C) (Oral)   Resp 16   Ht 5' 4.5" (1.638 m)   Wt 267 lb 9.6 oz (121.4 kg)   SpO2 98%   BMI 45.22 kg/m  General appearance: alert, cooperative, appears stated age and no distress Head: Normocephalic, without obvious abnormality, atraumatic Eyes: negative findings: lids and lashes normal, conjunctivae and sclerae normal and pupils equal, round, reactive to light and accomodation Ears: normal TM's and external ear canals both ears Nose: Nares normal. Septum midline. Mucosa normal. No drainage or sinus tenderness. Throat: lips, mucosa, and tongue normal; teeth and gums normal Neck: no adenopathy, no carotid bruit, no JVD, supple, symmetrical, trachea midline and thyroid not enlarged, symmetric, no tenderness/mass/nodules Back: symmetric, no curvature. ROM normal. No CVA tenderness. Lungs: clear to auscultation bilaterally Breasts: normal appearance, no masses or tenderness Heart:  regular rate and rhythm, S1, S2 normal, no murmur, click, rub or gallop Abdomen: soft, non-tender; bowel sounds normal; no masses,  no organomegaly Pelvic: not indicated; post-menopausal, no abnormal Pap smears in past Extremities: extremities normal, atraumatic, no cyanosis or edema Pulses: 2+ and symmetric Skin: Skin color, texture, turgor normal. No rashes or lesions Lymph nodes: Cervical, supraclavicular, and axillary nodes normal. Neurologic: Alert and oriented X 3, normal strength and tone. Normal symmetric reflexes. Normal coordination and gait    Assessment:    Healthy female exam.   Plan:    ghm utd Check labs  See After Visit Summary for Counseling Recommendations    1. Hyperlipidemia associated with type 2 diabetes mellitus ([place]) Encouraged heart healthy diet, increase exercise, avoid trans fats, consider a krill oil cap daily - Comprehensive metabolic panel - Lipid panel  2. Hypothyroidism, unspecified type Check labs   - TSH  3. Gastroesophageal reflux disease, esophagitis presence not specified  - Hemoglobin A1c - CBC with Differential/Platelet - Comprehensive metabolic panel - Lipid panel  4. Hyperglycemia Check labs  - Hemoglobin A1c - Comprehensive metabolic panel

## 2017-10-17 NOTE — Patient Instructions (Signed)
Preventive Care 68 Years and Older, Female Preventive care refers to lifestyle choices and visits with your health care provider that can promote health and wellness. What does preventive care include?  A yearly physical exam. This is also called an annual well check.  Dental exams once or twice a year.  Routine eye exams. Ask your health care provider how often you should have your eyes checked.  Personal lifestyle choices, including: ? Daily care of your teeth and gums. ? Regular physical activity. ? Eating a healthy diet. ? Avoiding tobacco and drug use. ? Limiting alcohol use. ? Practicing safe sex. ? Taking low-dose aspirin every day. ? Taking vitamin and mineral supplements as recommended by your health care provider. What happens during an annual well check? The services and screenings done by your health care provider during your annual well check will depend on your age, overall health, lifestyle risk factors, and family history of disease. Counseling Your health care provider may ask you questions about your:  Alcohol use.  Tobacco use.  Drug use.  Emotional well-being.  Home and relationship well-being.  Sexual activity.  Eating habits.  History of falls.  Memory and ability to understand (cognition).  Work and work environment.  Reproductive health.  Screening You may have the following tests or measurements:  Height, weight, and BMI.  Blood pressure.  Lipid and cholesterol levels. These may be checked every 5 years, or more frequently if you are over 50 years old.  Skin check.  Lung cancer screening. You may have this screening every year starting at age 55 if you have a 30-pack-year history of smoking and currently smoke or have quit within the past 15 years.  Fecal occult blood test (FOBT) of the stool. You may have this test every year starting at age 50.  Flexible sigmoidoscopy or colonoscopy. You may have a sigmoidoscopy every 5 years or  a colonoscopy every 10 years starting at age 50.  Hepatitis C blood test.  Hepatitis B blood test.  Sexually transmitted disease (STD) testing.  Diabetes screening. This is done by checking your blood sugar (glucose) after you have not eaten for a while (fasting). You may have this done every 1-3 years.  Bone density scan. This is done to screen for osteoporosis. You may have this done starting at age 65.  Mammogram. This may be done every 1-2 years. Talk to your health care provider about how often you should have regular mammograms.  Talk with your health care provider about your test results, treatment options, and if necessary, the need for more tests. Vaccines Your health care provider may recommend certain vaccines, such as:  Influenza vaccine. This is recommended every year.  Tetanus, diphtheria, and acellular pertussis (Tdap, Td) vaccine. You may need a Td booster every 10 years.  Varicella vaccine. You may need this if you have not been vaccinated.  Zoster vaccine. You may need this after age 60.  Measles, mumps, and rubella (MMR) vaccine. You may need at least one dose of MMR if you were born in 1957 or later. You may also need a second dose.  Pneumococcal 13-valent conjugate (PCV13) vaccine. One dose is recommended after age 65.  Pneumococcal polysaccharide (PPSV23) vaccine. One dose is recommended after age 65.  Meningococcal vaccine. You may need this if you have certain conditions.  Hepatitis A vaccine. You may need this if you have certain conditions or if you travel or work in places where you may be exposed to hepatitis   A.  Hepatitis B vaccine. You may need this if you have certain conditions or if you travel or work in places where you may be exposed to hepatitis B.  Haemophilus influenzae type b (Hib) vaccine. You may need this if you have certain conditions.  Talk to your health care provider about which screenings and vaccines you need and how often you  need them. This information is not intended to replace advice given to you by your health care provider. Make sure you discuss any questions you have with your health care provider. Document Released: 03/04/2015 Document Revised: 10/26/2015 Document Reviewed: 12/07/2014 Elsevier Interactive Patient Education  2018 [employer]  

## 2017-10-18 ENCOUNTER — Encounter: Payer: Self-pay | Admitting: Family Medicine

## 2017-10-18 NOTE — Telephone Encounter (Signed)
Please give her number for healthy weight and wellness --- I think I put referral in Her a1c was high so even though she was not fasting --- it really did not matter --- she is a diabetic

## 2017-10-23 ENCOUNTER — Other Ambulatory Visit: Payer: Self-pay | Admitting: *Deleted

## 2017-10-23 DIAGNOSIS — Z6841 Body Mass Index (BMI) 40.0 and over, adult: Principal | ICD-10-CM

## 2017-10-29 ENCOUNTER — Ambulatory Visit (INDEPENDENT_AMBULATORY_CARE_PROVIDER_SITE_OTHER): Payer: Medicare Other | Admitting: Family Medicine

## 2017-10-29 ENCOUNTER — Encounter: Payer: Self-pay | Admitting: Family Medicine

## 2017-10-29 VITALS — BP 125/60 | HR 86 | Temp 98.3°F | Resp 16 | Ht 65.0 in | Wt 272.6 lb

## 2017-10-29 DIAGNOSIS — S90221A Contusion of right lesser toe(s) with damage to nail, initial encounter: Secondary | ICD-10-CM

## 2017-10-29 DIAGNOSIS — Z23 Encounter for immunization: Secondary | ICD-10-CM

## 2017-10-29 HISTORY — DX: Contusion of right lesser toe(s) with damage to nail, initial encounter: S90.221A

## 2017-10-29 NOTE — Patient Instructions (Signed)
Subungual Hematoma A subungual hematoma, sometimes called runner's toe or tennis toe, is a collection of blood under a fingernail or toenail. What are the causes? This condition is caused by a crush injury to the finger or toe that breaks a blood vessel beneath the nail. It can develop after a hard, direct hit to a finger or toe, or after pressure is repeatedly put on an injured finger or toe. What are the signs or symptoms? Symptoms of this condition include:  A blue or dark blue color under the nail.  Pain or throbbing in the injured area.  How is this diagnosed? This condition is diagnosed with a medical history and a physical exam. How is this treated? Usually treatment is not needed for this condition. It usually goes away with time. If the condition is causing a lot of pain, or if a lot of blood collects under the fingernail or toenail, a health care provider may perform a painless procedure to drain the blood from beneath the nail. If there is a cut under the nail that requires stitches (sutures), the nail may be removed. Follow these instructions at home:  If directed, apply ice to the injured area: ? Put ice in a plastic bag. ? Place a towel between your skin and the bag. ? Leave the ice on for 20 minutes, 2-3 times per day.  Raise (elevate) the injured finger or toe above the level of your heart while you are sitting or lying down. This will help to decrease pain and swelling.  If part of your nail falls off, gently trim the remaining nail. This prevents the remaining nail from catching on something and causing further injury.  Follow instructions from your health care provider about how to take care of your injury. Make sure you: ? Wash your hands with soap and water before you change your bandage (dressing). If soap and water are not available, use hand sanitizer. ? Change your dressing as told by your health care provider. ? Leave stitches (sutures) in place. You may have  these if your health care provider repaired a cut under the nail. The sutures may need to stay in place for 2 weeks or longer.  Take over-the-counter and prescription medicines only as told by your health care provider.  Keep all follow-up visits. This is important. If you had a procedure to drain the blood from under your nail, follow up with your health care provider as told. Contact a health care provider if:  Your pain is not controlled with medicine.  You have redness, swelling, or pain around your nail. Get help right away if:  You have fluid, blood, or pus coming from your nail.  You have a fever. This information is not intended to replace advice given to you by your health care provider. Make sure you discuss any questions you have with your health care provider. Document Released: 02/03/2000 Document Revised: 10/05/2015 Document Reviewed: 06/23/2014 Elsevier Interactive Patient Education  2018 [employer].

## 2017-10-29 NOTE — Progress Notes (Signed)
Patient ID: Amy Mahoney, female    DOB: 10-05-1950  Age: 67 y.o. MRN: [id number]    Subjective:  Subjective  HPI Amy Mahoney presents for r great toe black after stubbing it about 1 month ago.  It is not painful.  No other complaints. She would also like her flu shot.   Review of Systems  Constitutional: Negative for activity change, appetite change, fatigue, fever and unexpected weight change.  HENT: Negative for congestion.   Respiratory: Negative for cough and shortness of breath.   Cardiovascular: Negative for chest pain, palpitations and leg swelling.  Gastrointestinal: Negative for abdominal pain, blood in stool and nausea.  Genitourinary: Negative for dysuria and frequency.  Skin: Negative for rash.  Allergic/Immunologic: Negative for environmental allergies.  Neurological: Negative for dizziness and headaches.  Psychiatric/Behavioral: Negative for behavioral problems and dysphoric mood. The patient is not nervous/anxious.     History Past Medical History:  Diagnosis Date  . Depression   . Sleep apnea   . Spider bite   . Thyroid disease    hypothyroidism    She has a past surgical history that includes Cholecystectomy; Dilation and curettage of uterus; Tonsillectomy; fibroids scrapping; and Esophagogastroduodenoscopy (egd) with propofol (N/A, 09/05/2017).   Her family history includes Arthritis in her unknown relative; Breast cancer in her maternal aunt and paternal grandmother; Cancer in her maternal aunt and maternal aunt; Depression in her unknown relative; Diabetes in her maternal aunt and mother; Hyperlipidemia in her unknown relative; Lung cancer in her maternal aunt.She reports that she quit smoking about 27 years ago. Her smoking use included cigarettes. She has a 1.50 pack-year smoking history. She has never used smokeless tobacco. She reports that she drinks alcohol. She reports that she does not use drugs.  Current Outpatient Medications on File Prior to Visit    Medication Sig Dispense Refill  . atorvastatin (LIPITOR) 40 MG tablet TAKE 1 TABLET BY MOUTH  DAILY 90 tablet 1  . Calcium Carbonate-Vitamin D (CALCIUM 500 + D PO) Take 1 tablet by mouth 2 (two) times daily.      . fenofibrate 160 MG tablet TAKE 1 TABLET BY MOUTH  DAILY 90 tablet 1  . hydrochlorothiazide (HYDRODIURIL) 25 MG tablet TAKE 1 TABLET BY MOUTH  DAILY (Patient taking differently: TAKE 1 TABLET BY MOUTH  DAILY AS NEEDED FOR SWELLING) 90 tablet 1  . hydroxypropyl methylcellulose / hypromellose (ISOPTO TEARS / GONIOVISC) 2.5 % ophthalmic solution Place 1 drop into both eyes daily as needed for dry eyes.    [name] levothyroxine (SYNTHROID, LEVOTHROID) 100 MCG tablet TAKE 1 TABLET BY MOUTH  DAILY BEFORE BREAKFAST 90 tablet 1  . Multiple Vitamin (MULTIVITAMIN) tablet Take 1 tablet by mouth daily.      [name] venlafaxine XR (EFFEXOR-XR) 150 MG 24 hr capsule TAKE 1 CAPSULE BY MOUTH  DAILY 90 capsule 1  . vitamin E 400 UNIT capsule Take 400 Units by mouth daily.     No current facility-administered medications on file prior to visit.      Objective:  Objective  Physical Exam  Constitutional: She is oriented to person, place, and time. She appears well-developed and well-nourished.  HENT:  Head: Normocephalic and atraumatic.  Eyes: Conjunctivae and EOM are normal.  Neck: Normal range of motion. Neck supple. No JVD present. Carotid bruit is not present. No thyromegaly present.  Cardiovascular: Normal rate, regular rhythm and normal heart sounds.  No murmur heard. Pulmonary/Chest: Effort normal and breath sounds normal. No respiratory distress.  She has no wheezes. She has no rales. She exhibits no tenderness.  Musculoskeletal: She exhibits no edema.       Right foot: There is normal range of motion, no tenderness, no bony tenderness, no swelling, normal capillary refill, no crepitus, no deformity and no laceration.       Feet:  Neurological: She is alert and oriented to person, place, and time.   Psychiatric: She has a normal mood and affect.  Nursing note and vitals reviewed.  BP 125/60 (BP Location: Left Arm, Cuff Size: Large)   Pulse 86   Temp 98.3 F (36.8 C) (Oral)   Resp 16   Ht 5\' 5"  (1.651 m)   Wt 272 lb 9.6 oz (123.7 kg)   SpO2 97%   BMI 45.36 kg/m  Wt Readings from Last 3 Encounters:  10/29/17 272 lb 9.6 oz (123.7 kg)  10/17/17 267 lb 9.6 oz (121.4 kg)  09/05/17 258 lb (117 kg)     Lab Results  Component Value Date   WBC 6.1 10/17/2017   HGB 12.7 10/17/2017   HCT 38.0 10/17/2017   PLT 281.0 10/17/2017   GLUCOSE 150 (H) 10/17/2017   CHOL 121 10/17/2017   TRIG 165.0 (H) 10/17/2017   HDL 35.90 (L) 10/17/2017   LDLDIRECT 193.3 11/06/2012   LDLCALC 52 10/17/2017   ALT 21 10/17/2017   AST 29 10/17/2017   NA 142 10/17/2017   K 4.0 10/17/2017   CL 104 10/17/2017   CREATININE 0.94 10/17/2017   BUN 18 10/17/2017   CO2 32 10/17/2017   TSH 2.21 10/17/2017   HGBA1C 7.4 (H) 10/17/2017   MICROALBUR 1.4 09/12/2015    Mm 3d Screen Breast Bilateral  Result Date: 10/18/2017 CLINICAL DATA:  Screening. EXAM: DIGITAL SCREENING BILATERAL MAMMOGRAM WITH TOMO AND CAD COMPARISON:  Previous exam(s). ACR Breast Density Category a: The breast tissue is almost entirely fatty. FINDINGS: There are no findings suspicious for malignancy. Images were processed with CAD. IMPRESSION: No mammographic evidence of malignancy. A result letter of this screening mammogram will be mailed directly to the patient. RECOMMENDATION: Screening mammogram in one year. (Code:SM-B-01Y) BI-RADS CATEGORY  1: Negative. Electronically Signed   By: Lajean Manes M.D.   On: 10/18/2017 10:33     Assessment & Plan:  Plan  I have discontinued Amy Mahoney's fluconazole and pantoprazole. I am also having her maintain her Calcium Carbonate-Vitamin D (CALCIUM 500 + D PO), multivitamin, venlafaxine XR, atorvastatin, hydrochlorothiazide, levothyroxine, fenofibrate, vitamin E, and hydroxypropyl methylcellulose  / hypromellose.  No orders of the defined types were placed in this encounter.   Problem List Items Addressed This Visit      Unprioritized   Subungual hematoma of fifth toe of right foot - Primary    Pt with no pain-- nothing more to do rto if symptoms change       Other Visit Diagnoses    Influenza vaccine administered       Relevant Orders   Flu vaccine HIGH DOSE PF (Fluzone High Dose) (Completed)      Follow-up: Return if symptoms worsen or fail to improve.  [name], DO

## 2017-10-29 NOTE — Assessment & Plan Note (Signed)
Pt with no pain-- nothing more to do rto if symptoms change

## 2017-11-06 ENCOUNTER — Other Ambulatory Visit: Payer: Self-pay | Admitting: Family Medicine

## 2017-11-06 DIAGNOSIS — K219 Gastro-esophageal reflux disease without esophagitis: Secondary | ICD-10-CM

## 2017-11-26 ENCOUNTER — Encounter: Payer: Self-pay | Admitting: Family Medicine

## 2017-11-26 DIAGNOSIS — S90221D Contusion of right lesser toe(s) with damage to nail, subsequent encounter: Secondary | ICD-10-CM

## 2017-11-27 NOTE — Telephone Encounter (Signed)
A podiatry referral may be more appropriate than ortho

## 2017-12-12 ENCOUNTER — Encounter: Payer: Self-pay | Admitting: Internal Medicine

## 2017-12-17 ENCOUNTER — Ambulatory Visit: Payer: Medicare Other | Admitting: Podiatry

## 2017-12-17 ENCOUNTER — Encounter: Payer: Self-pay | Admitting: Podiatry

## 2017-12-17 DIAGNOSIS — L601 Onycholysis: Secondary | ICD-10-CM | POA: Diagnosis not present

## 2017-12-17 DIAGNOSIS — S99929S Unspecified injury of unspecified foot, sequela: Secondary | ICD-10-CM | POA: Diagnosis not present

## 2017-12-17 NOTE — Patient Instructions (Signed)
Watch the toenails closely. The one on the right is likely going to come off. If you notice any redness, drainage or any signs of infection please let me know.  If was nice to meet you today. If you have any questions or any further concerns, please feel fee to give me a call. You can call our office at [phone] or please feel fee to send me a message through [facility].

## 2017-12-18 ENCOUNTER — Encounter (INDEPENDENT_AMBULATORY_CARE_PROVIDER_SITE_OTHER): Payer: Medicare Other

## 2017-12-23 NOTE — Progress Notes (Signed)
Subjective:   Patient ID: Amy Mahoney, female   DOB: 67 y.o.   MRN: [id number]   HPI 67 year old female presents the office today for concerns of toenail issues.  She states the right big toenail is not growing now but the left toenail is growing out.  She had pulled her foot slipped on the end of July 2019.  Occasion hurts with pressure does not hurt significantly shoes.  She has the right toenail is attached.  She has no other concerns.  Review of Systems  All other systems reviewed and are negative.  Past Medical History:  Diagnosis Date  . Depression   . Sleep apnea   . Spider bite   . Thyroid disease    hypothyroidism    Past Surgical History:  Procedure Laterality Date  . CHOLECYSTECTOMY    . DILATION AND CURETTAGE OF UTERUS    . ESOPHAGOGASTRODUODENOSCOPY (EGD) WITH PROPOFOL N/A 09/05/2017   Procedure: ESOPHAGOGASTRODUODENOSCOPY (EGD) WITH PROPOFOL;  Surgeon: Jerene Bears, MD;  Location: [facility];  Service: Gastroenterology;  Laterality: N/A;  . fibroids scrapping    . TONSILLECTOMY       Current Outpatient Medications:  .  atorvastatin (LIPITOR) 40 MG tablet, TAKE 1 TABLET BY MOUTH  DAILY, Disp: 90 tablet, Rfl: 1 .  Calcium Carbonate-Vitamin D (CALCIUM 500 + D PO), Take 1 tablet by mouth 2 (two) times daily.  , Disp: , Rfl:  .  fenofibrate 160 MG tablet, TAKE 1 TABLET BY MOUTH  DAILY, Disp: 90 tablet, Rfl: 1 .  hydrochlorothiazide (HYDRODIURIL) 25 MG tablet, TAKE 1 TABLET BY MOUTH  DAILY (Patient taking differently: TAKE 1 TABLET BY MOUTH  DAILY AS NEEDED FOR SWELLING), Disp: 90 tablet, Rfl: 1 .  hydroxypropyl methylcellulose / hypromellose (ISOPTO TEARS / GONIOVISC) 2.5 % ophthalmic solution, Place 1 drop into both eyes daily as needed for dry eyes., Disp: , Rfl:  .  levothyroxine (SYNTHROID, LEVOTHROID) 100 MCG tablet, TAKE 1 TABLET BY MOUTH  DAILY BEFORE BREAKFAST, Disp: 90 tablet, Rfl: 1 .  Multiple Vitamin (MULTIVITAMIN) tablet, Take 1 tablet by mouth daily.  ,  Disp: , Rfl:  .  pantoprazole (PROTONIX) 40 MG tablet, TAKE 1 TABLET BY MOUTH TWICE A DAY, Disp: 180 tablet, Rfl: 1 .  venlafaxine XR (EFFEXOR-XR) 150 MG 24 hr capsule, TAKE 1 CAPSULE BY MOUTH  DAILY, Disp: 90 capsule, Rfl: 1 .  vitamin E 400 UNIT capsule, Take 400 Units by mouth daily., Disp: , Rfl:   No Known Allergies      Objective:  Physical Exam  General: AAO x3, NAD  Dermatological: Left hallux toenails appear to the underlying nail bed of the right hallux toenail appears to be loose distally and there is old subungual hematoma under the toenails of the right side worse than left.  There is no pain in the nail there is no surrounding redness or drainage or any clinical signs of infection noted today.  Vascular: Dorsalis Pedis artery and Posterior Tibial artery pedal pulses are 2/4 bilateral with immedate capillary fill time.  There is no pain with calf compression, swelling, warmth, erythema.   Neruologic: Grossly intact via light touch bilateral.  Protective threshold with Semmes Wienstein monofilament intact to all pedal sites bilateral.   Musculoskeletal: No gross boney pedal deformities bilateral. No pain, crepitus, or limitation noted with foot and ankle range of motion bilateral. Muscular strength 5/5 in all groups tested bilateral.  Gait: Unassisted, Nonantalgic.     Assessment:   Bilateral  hallux onychomycosis, onychodystrophy right >> left     Plan:  -Treatment options discussed including all alternatives, risks, and complications -Etiology of symptoms were discussed -I debrided the nails without any complications or bleeding.  Discussed with toenail removal of the right hyper externally not infected is not causing we will come off on its own.  Monitoring signs or symptoms of infection or if there is any increase in pain we will go ahead and remove it  Trula Slade DPM

## 2017-12-24 ENCOUNTER — Encounter: Payer: Self-pay | Admitting: Internal Medicine

## 2018-01-01 ENCOUNTER — Encounter (INDEPENDENT_AMBULATORY_CARE_PROVIDER_SITE_OTHER): Payer: Self-pay | Admitting: Family Medicine

## 2018-01-01 ENCOUNTER — Ambulatory Visit (INDEPENDENT_AMBULATORY_CARE_PROVIDER_SITE_OTHER): Payer: Medicare Other | Admitting: Family Medicine

## 2018-01-01 VITALS — BP 148/83 | HR 61 | Temp 97.9°F | Ht 65.0 in | Wt 264.0 lb

## 2018-01-01 DIAGNOSIS — E1169 Type 2 diabetes mellitus with other specified complication: Secondary | ICD-10-CM | POA: Diagnosis not present

## 2018-01-01 DIAGNOSIS — Z6841 Body Mass Index (BMI) 40.0 and over, adult: Secondary | ICD-10-CM | POA: Diagnosis not present

## 2018-01-01 DIAGNOSIS — Z0289 Encounter for other administrative examinations: Secondary | ICD-10-CM

## 2018-01-01 DIAGNOSIS — R0602 Shortness of breath: Secondary | ICD-10-CM | POA: Diagnosis not present

## 2018-01-01 DIAGNOSIS — R5383 Other fatigue: Secondary | ICD-10-CM | POA: Diagnosis not present

## 2018-01-01 DIAGNOSIS — Z1331 Encounter for screening for depression: Secondary | ICD-10-CM | POA: Diagnosis not present

## 2018-01-01 DIAGNOSIS — E559 Vitamin D deficiency, unspecified: Secondary | ICD-10-CM | POA: Diagnosis not present

## 2018-01-02 ENCOUNTER — Encounter (INDEPENDENT_AMBULATORY_CARE_PROVIDER_SITE_OTHER): Payer: Self-pay | Admitting: Family Medicine

## 2018-01-02 LAB — CBC WITH DIFFERENTIAL/PLATELET
BASOS: 1 %
Basophils Absolute: 0.1 10*3/uL (ref 0.0–0.2)
EOS (ABSOLUTE): 0.4 10*3/uL (ref 0.0–0.4)
EOS: 6 %
HEMATOCRIT: 38 % (ref 34.0–46.6)
HEMOGLOBIN: 12.5 g/dL (ref 11.1–15.9)
IMMATURE GRANS (ABS): 0 10*3/uL (ref 0.0–0.1)
IMMATURE GRANULOCYTES: 1 %
Lymphocytes Absolute: 2.3 10*3/uL (ref 0.7–3.1)
Lymphs: 38 %
MCH: 29.7 pg (ref 26.6–33.0)
MCHC: 32.9 g/dL (ref 31.5–35.7)
MCV: 90 fL (ref 79–97)
MONOS ABS: 0.5 10*3/uL (ref 0.1–0.9)
Monocytes: 7 %
Neutrophils Absolute: 2.9 10*3/uL (ref 1.4–7.0)
Neutrophils: 47 %
Platelets: 305 10*3/uL (ref 150–450)
RBC: 4.21 x10E6/uL (ref 3.77–5.28)
RDW: 12.5 % (ref 12.3–15.4)
WBC: 6.2 10*3/uL (ref 3.4–10.8)

## 2018-01-02 LAB — COMPREHENSIVE METABOLIC PANEL
ALBUMIN: 4.4 g/dL (ref 3.6–4.8)
ALT: 21 IU/L (ref 0–32)
AST: 27 IU/L (ref 0–40)
Albumin/Globulin Ratio: 1.8 (ref 1.2–2.2)
Alkaline Phosphatase: 54 IU/L (ref 39–117)
BUN / CREAT RATIO: 20 (ref 12–28)
BUN: 18 mg/dL (ref 8–27)
Bilirubin Total: 0.3 mg/dL (ref 0.0–1.2)
CALCIUM: 9.6 mg/dL (ref 8.7–10.3)
CO2: 24 mmol/L (ref 20–29)
CREATININE: 0.89 mg/dL (ref 0.57–1.00)
Chloride: 102 mmol/L (ref 96–106)
GFR calc Af Amer: 77 mL/min/{1.73_m2} (ref >59)
GFR, EST NON AFRICAN AMERICAN: 67 mL/min/{1.73_m2} (ref >59)
GLOBULIN, TOTAL: 2.4 g/dL (ref 1.5–4.5)
Glucose: 111 mg/dL — ABNORMAL HIGH (ref 65–99)
Potassium: 4.5 mmol/L (ref 3.5–5.2)
SODIUM: 142 mmol/L (ref 134–144)
Total Protein: 6.8 g/dL (ref 6.0–8.5)

## 2018-01-02 LAB — VITAMIN D 25 HYDROXY (VIT D DEFICIENCY, FRACTURES): VIT D 25 HYDROXY: 24 ng/mL — AB (ref 30.0–100.0)

## 2018-01-02 LAB — LIPID PANEL
CHOL/HDL RATIO: 3.4 ratio (ref 0.0–4.4)
Cholesterol, Total: 132 mg/dL (ref 100–199)
HDL: 39 mg/dL — AB (ref >39)
LDL CALC: 73 mg/dL (ref 0–99)
TRIGLYCERIDES: 100 mg/dL (ref 0–149)
VLDL CHOLESTEROL CAL: 20 mg/dL (ref 5–40)

## 2018-01-02 LAB — HEMOGLOBIN A1C
ESTIMATED AVERAGE GLUCOSE: 151 mg/dL
Hgb A1c MFr Bld: 6.9 % — ABNORMAL HIGH (ref 4.8–5.6)

## 2018-01-02 LAB — T3: T3, Total: 106 ng/dL (ref 71–180)

## 2018-01-02 LAB — T4, FREE: Free T4: 1.09 ng/dL (ref 0.82–1.77)

## 2018-01-02 LAB — INSULIN, RANDOM: INSULIN: 14.9 u[IU]/mL (ref 2.6–24.9)

## 2018-01-02 LAB — TSH: TSH: 6.01 u[IU]/mL — ABNORMAL HIGH (ref 0.450–4.500)

## 2018-01-02 MED ORDER — LISINOPRIL 5 MG PO TABS: 5.0000 mg | ORAL_TABLET | Freq: Every day | ORAL | 0 refills | Status: DC

## 2018-01-02 NOTE — Progress Notes (Signed)
Office: [phone]  /  Fax: [phone]   Dear Dr. Carollee Herter,   Thank you for referring Amy Mahoney to our clinic. The following note includes my evaluation and treatment recommendations.  HPI:   Chief Complaint: OBESITY    Amy Mahoney has been referred by [name], DO for consultation regarding her obesity and obesity related comorbidities.    [name] (MR# [id number]) is a 67 y.o. female who presents on 01/02/2018 for obesity evaluation and treatment. Current BMI is Body mass index is 43.93 kg/m.Amy Mahoney Amy Mahoney has been struggling with her weight for many years and has been unsuccessful in either losing weight, maintaining weight loss, or reaching her healthy weight goal.     Amy Mahoney attended our information session and states she is currently in the action stage of change and ready to dedicate time achieving and maintaining a healthier weight. Amy Mahoney is interested in becoming our patient and working on intensive lifestyle modifications including (but not limited to) diet, exercise and weight loss.    Amy Mahoney states her family eats meals together she thinks her family will eat healthier with  her her desired weight loss is 117 lbs she has been heavy most of  her life she started gaining weight when she got married her heaviest weight ever was 300 lbs. she has significant food cravings issues  she snacks frequently in the evenings she frequently makes poor food choices she frequently eats larger portions than normal  she has binge eating behaviors she struggles with emotional eating   History of Lap Band Surgery   Fatigue Harmani feels her energy is lower than it should be. This has worsened with weight gain and has not worsened recently. Laurelai admits to daytime somnolence and she admits to waking up still tired. Patient has a history of obstructive sleep apnea which may contribute to her fatigue. Patent has a history of symptoms of daytime fatigue and morning fatigue.  Patient generally gets 7 1/2 to 8 hours of sleep per night, and states they generally have generally restful sleep. Snoring is not present with CPAP. Apneic episodes are not present. Epworth Sleepiness Score is 7  EKG was ordered today which shows normal sinus rhythm with low voltage.  Dyspnea on exertion Amy Mahoney notes increasing shortness of breath with exercising and seems to be worsening over time with weight gain. She notes getting out of breath sooner with activity than she used to. This has not gotten worse recently. EKG was ordered today which shows normal sinus rhythm with low voltage. Amy Mahoney denies orthopnea.  Diabetes II Amy Mahoney has a recent diagnosis of diabetes type II, although on review, patient has been diagnosed for over one year.  Amy Mahoney is on statin and she is not on ACE or ARB and she hasn't started Metformin. Last A1c was at 7.4 She is attempting to work on intensive lifestyle modifications including diet, exercise, and weight loss to help control her blood glucose levels.  Hypothyroidism Amy Mahoney has a diagnosis of hypothyroidism. Her last thyroid panel was normal. She is on levothyroxine. She denies hot or cold intolerance or palpitations, but does admit to ongoing fatigue.  Vitamin D deficiency Amy Mahoney has a diagnosis of vitamin D deficiency. She is currently taking OTC vit D and denies nausea, vomiting or muscle weakness.  Depression Screen Amy Mahoney's Food and Mood (modified PHQ-9) score was  Depression screen PHQ 2/9 01/01/2018  Decreased Interest 1  Down, Depressed, Hopeless 2  PHQ - 2 Score 3  Altered sleeping  1  Tired, decreased energy 3  Change in appetite 1  Feeling bad or failure about yourself  2  Trouble concentrating 0  Moving slowly or fidgety/restless 0  Suicidal thoughts 0  PHQ-9 Score 10  Difficult doing work/chores Not difficult at all    ALLERGIES: No Known Allergies  MEDICATIONS: Current Outpatient Medications on File Prior to Visit  Medication Sig Dispense  Refill  . atorvastatin (LIPITOR) 40 MG tablet TAKE 1 TABLET BY MOUTH  DAILY 90 tablet 1  . Calcium Carbonate-Vitamin D (CALCIUM 500 + D PO) Take 1 tablet by mouth 2 (two) times daily.      . fenofibrate 160 MG tablet TAKE 1 TABLET BY MOUTH  DAILY 90 tablet 1  . hydrochlorothiazide (HYDRODIURIL) 25 MG tablet TAKE 1 TABLET BY MOUTH  DAILY (Patient taking differently: TAKE 1 TABLET BY MOUTH  DAILY AS NEEDED FOR SWELLING) 90 tablet 1  . levothyroxine (SYNTHROID, LEVOTHROID) 100 MCG tablet TAKE 1 TABLET BY MOUTH  DAILY BEFORE BREAKFAST 90 tablet 1  . Multiple Vitamin (MULTIVITAMIN) tablet Take 1 tablet by mouth daily.      Amy Mahoney venlafaxine XR (EFFEXOR-XR) 150 MG 24 hr capsule TAKE 1 CAPSULE BY MOUTH  DAILY 90 capsule 1  . vitamin E 400 UNIT capsule Take 400 Units by mouth daily.     No current facility-administered medications on file prior to visit.     PAST MEDICAL HISTORY: Past Medical History:  Diagnosis Date  . Back pain   . Constipation   . Depression   . Gallbladder problem   . Hyperlipidemia   . Prediabetes   . Sleep apnea   . Spider bite   . Swelling   . Thyroid disease    hypothyroidism    PAST SURGICAL HISTORY: Past Surgical History:  Procedure Laterality Date  . CHOLECYSTECTOMY    . DILATION AND CURETTAGE OF UTERUS    . ESOPHAGOGASTRODUODENOSCOPY (EGD) WITH PROPOFOL N/A 09/05/2017   Procedure: ESOPHAGOGASTRODUODENOSCOPY (EGD) WITH PROPOFOL;  Surgeon: Jerene Bears, MD;  Location: [facility];  Service: Gastroenterology;  Laterality: N/A;  . fibroids scrapping    . TONSILLECTOMY      SOCIAL HISTORY: Social History   Tobacco Use  . Smoking status: Former Smoker    Packs/day: 0.50    Years: 3.00    Pack years: 1.50    Types: Cigarettes    Last attempt to quit: 08/10/1990    Years since quitting: 27.4  . Smokeless tobacco: Never Used  Substance Use Topics  . Alcohol use: Yes    Comment: rarely  . Drug use: No    FAMILY HISTORY: Family History  Problem  Relation Age of Onset  . Diabetes Mother   . Thyroid disease Mother   . Depression Mother   . Depression Unknown   . Hyperlipidemia Unknown   . Arthritis Unknown   . Diabetes Maternal Aunt   . Breast cancer Paternal Grandmother   . Lung cancer Maternal Aunt        Nonsmoker  . Cancer Maternal Aunt        breast  . Breast cancer Maternal Aunt   . Cancer Maternal Aunt        lung(nonsmoker)  . Colon cancer Neg Hx   . Pancreatic cancer Neg Hx   . Stomach cancer Neg Hx     ROS: Review of Systems  Constitutional: Positive for malaise/fatigue.  HENT: Positive for hearing loss.   Eyes:       +  Wear Glasses or Contacts  Cardiovascular: Negative for palpitations.  Gastrointestinal: Negative for nausea and vomiting.  Musculoskeletal:       Negative for muscle weakness  Skin:       + Dryness   Endo/Heme/Allergies:       Negative for heat or cold intolerance  Psychiatric/Behavioral: Positive for depression.    PHYSICAL EXAM: Blood pressure (!) 148/83, pulse 61, temperature 97.9 F (36.6 C), temperature source Oral, height 5\' 5"  (1.651 m), weight 264 lb (119.7 kg), SpO2 97 %. Body mass index is 43.93 kg/m. Physical Exam  Constitutional: She is oriented to person, place, and time. She appears well-developed and well-nourished.  HENT:  Head: Normocephalic and atraumatic.  Nose: Nose normal.  Eyes: EOM are normal. No scleral icterus.  Neck: Normal range of motion. Neck supple. No thyromegaly present.  Cardiovascular: Normal rate and regular rhythm.  Pulmonary/Chest: Effort normal. No respiratory distress.  Abdominal: Soft. There is no tenderness.  + Obesity  Musculoskeletal: Normal range of motion.  Range of Motion normal in all 4 extremities  Neurological: She is alert and oriented to person, place, and time. Coordination normal.  Skin: Skin is warm and dry.  Psychiatric: She has a normal mood and affect.  Vitals reviewed.   RECENT LABS AND TESTS: BMET    Component  Value Date/Time   NA 142 01/01/2018 1136   K 4.5 01/01/2018 1136   CL 102 01/01/2018 1136   CO2 24 01/01/2018 1136   GLUCOSE 111 (H) 01/01/2018 1136   GLUCOSE 150 (H) 10/17/2017 1416   BUN 18 01/01/2018 1136   CREATININE 0.89 01/01/2018 1136   CALCIUM 9.6 01/01/2018 1136   GFRNONAA 67 01/01/2018 1136   GFRAA 77 01/01/2018 1136   Lab Results  Component Value Date   HGBA1C 6.9 (H) 01/01/2018   Lab Results  Component Value Date   INSULIN 14.9 01/01/2018   CBC    Component Value Date/Time   WBC 6.2 01/01/2018 1136   WBC 6.1 10/17/2017 1416   RBC 4.21 01/01/2018 1136   RBC 4.24 10/17/2017 1416   HGB 12.5 01/01/2018 1136   HCT 38.0 01/01/2018 1136   PLT 305 01/01/2018 1136   MCV 90 01/01/2018 1136   MCH 29.7 01/01/2018 1136   MCH 30.6 12/26/2013 1540   MCHC 32.9 01/01/2018 1136   MCHC 33.4 10/17/2017 1416   RDW 12.5 01/01/2018 1136   LYMPHSABS 2.3 01/01/2018 1136   MONOABS 0.5 10/17/2017 1416   EOSABS 0.4 01/01/2018 1136   BASOSABS 0.1 01/01/2018 1136   Iron/TIBC/Ferritin/ %Sat No results found for: IRON, TIBC, FERRITIN, IRONPCTSAT Lipid Panel     Component Value Date/Time   CHOL 132 01/01/2018 1136   TRIG 100 01/01/2018 1136   HDL 39 (L) 01/01/2018 1136   CHOLHDL 3.4 01/01/2018 1136   CHOLHDL 3 10/17/2017 1416   VLDL 33.0 10/17/2017 1416   LDLCALC 73 01/01/2018 1136   LDLDIRECT 193.3 11/06/2012 0926   Hepatic Function Panel     Component Value Date/Time   PROT 6.8 01/01/2018 1136   ALBUMIN 4.4 01/01/2018 1136   AST 27 01/01/2018 1136   ALT 21 01/01/2018 1136   ALKPHOS 54 01/01/2018 1136   BILITOT 0.3 01/01/2018 1136   BILIDIR 0.1 08/09/2014 1628      Component Value Date/Time   TSH 6.010 (H) 01/01/2018 1136   TSH 2.21 10/17/2017 1416   TSH 1.50 10/08/2016 1128    ECG  shows NSR with a rate of 64 BPM INDIRECT  CALORIMETER done today shows a VO2 of 180 and a REE of 1250.  Her calculated basal metabolic rate is 0093 thus her basal metabolic rate is  worse than expected.    ASSESSMENT AND PLAN: Other fatigue - Plan: EKG 12-Lead, Comprehensive metabolic panel, CBC with Differential/Platelet, Lipid panel, T3, T4, free, TSH  Shortness of breath on exertion  Vitamin D deficiency - Plan: VITAMIN D 25 Hydroxy (Vit-D Deficiency, Fractures)  Type 2 diabetes mellitus with other specified complication, without long-term current use of insulin (HCC) - Plan: Hemoglobin A1c, Insulin, random, lisinopril (PRINIVIL,ZESTRIL) 5 MG tablet  Depression screening  Class 3 severe obesity with serious comorbidity and body mass index (BMI) of 40.0 to 44.9 in adult, unspecified obesity type (HCC)  PLAN: Fatigue Anntoinette was informed that her fatigue may be related to obesity, depression or many other causes. Labs will be ordered, and in the meanwhile Wyona has agreed to work on diet, exercise and weight loss to help with fatigue. Proper sleep hygiene was discussed including the need for 7-8 hours of quality sleep each night. A sleep study was not ordered based on symptoms and Epworth score. We will order indirect calorimetry and EKG today.  Dyspnea on exertion Vadis's shortness of breath appears to be obesity related and exercise induced. She has agreed to work on weight loss and gradually increase exercise to treat her exercise induced shortness of breath. If Veleta follows our instructions and loses weight without improvement of her shortness of breath, we will plan to refer to pulmonology.  We will order indirect calorimetry, EKG and labs today. We will monitor this condition regularly. Hennessy agrees to this plan.  Diabetes II Deziya has been given extensive diabetes education by myself today including ideal fasting and post-prandial blood glucose readings, individual ideal Hgb A1c goals and hypoglycemia prevention. We discussed the importance of good blood sugar control to decrease the likelihood of diabetic complications such as nephropathy, neuropathy, limb loss,  blindness, coronary artery disease, and death. We discussed the importance of intensive lifestyle modification including diet, exercise and weight loss as the first line treatment for diabetes. We will check Hgb A1c and insulin level today. Joury agrees to start Lisinopril 5 mg PO daily #30 with no refills and follow up at the agreed upon time.  Hypothyroidism Aryahi was informed of the importance of good thyroid control to help with weight loss efforts. She was also informed that supertheraputic thyroid levels are dangerous and will not improve weight loss results. We will check thyroid panel and Derionna will follow up as directed.  Vitamin D Deficiency Aela was informed that low vitamin D levels contributes to fatigue and are associated with obesity, breast, and colon cancer. She will continue OTC Vit D and will follow up for routine testing of vitamin D, at least 2-3 times per year. She was informed of the risk of over-replacement of vitamin D and agrees to not increase her dose unless she discusses this with [name] first. We will check vitamin D level today and Risa will follow up as directed.  Depression Screen Jesyka had a moderately positive depression screening. Depression is commonly associated with obesity and often results in emotional eating behaviors. We will monitor this closely and work on CBT to help improve the non-hunger eating patterns. Referral to Psychology may be required if no improvement is seen as she continues in our clinic.  Obesity Wynell is currently in the action stage of change and her goal is to continue with weight  loss efforts. I recommend Lynnix begin the structured treatment plan as follows:  She has agreed to follow the Category 2 plan +50 calories Nakya has been instructed to eventually work up to a goal of 150 minutes of combined cardio and strengthening exercise per week for weight loss and overall health benefits. We discussed the following Behavioral Modification Strategies  today: planning for success, increasing lean protein intake, increasing vegetables and work on meal planning and easy cooking plans   She was informed of the importance of frequent follow up visits to maximize her success with intensive lifestyle modifications for her multiple health conditions. She was informed we would discuss her lab results at her next visit unless there is a critical issue that needs to be addressed sooner. Myrlene agreed to keep her next visit at the agreed upon time to discuss these results.    OBESITY BEHAVIORAL INTERVENTION VISIT  Today's visit was # 1   Starting weight: 264 lbs Starting date: 01/01/2018 Today's weight : 264 lbs  Today's date: 01/01/2018 Total lbs lost to date: 0 At least 15 minutes were spent on discussing the following behavioral intervention visit.   ASK: We discussed the diagnosis of obesity with Amy Mahoney today and Agape agreed to give [name] permission to discuss obesity behavioral modification therapy today.  ASSESS: Dori has the diagnosis of obesity and her BMI today is 79.93 Shauntee is in the action stage of change   ADVISE: Kavitha was educated on the multiple health risks of obesity as well as the benefit of weight loss to improve her health. She was advised of the need for long term treatment and the importance of lifestyle modifications to improve her current health and to decrease her risk of future health problems.  AGREE: Multiple dietary modification options and treatment options were discussed and  Myldred agreed to follow the recommendations documented in the above note.  ARRANGE: Marka was educated on the importance of frequent visits to treat obesity as outlined per CMS and USPSTF guidelines and agreed to schedule her next follow up appointment today.  I, Doreene Nest, am acting as transcriptionist for Eber Jones, MD  I have reviewed the above documentation for accuracy and completeness, and I agree with the above. -  Ilene Qua, MD

## 2018-01-07 ENCOUNTER — Encounter: Payer: Self-pay | Admitting: Internal Medicine

## 2018-01-07 ENCOUNTER — Ambulatory Visit (AMBULATORY_SURGERY_CENTER): Payer: Self-pay

## 2018-01-07 VITALS — Ht 65.0 in | Wt 265.6 lb

## 2018-01-07 DIAGNOSIS — Z86010 Personal history of colonic polyps: Secondary | ICD-10-CM

## 2018-01-07 MED ORDER — NA SULFATE-K SULFATE-MG SULF 17.5-3.13-1.6 GM/177ML PO SOLN: 1.0000 | Freq: Once | ORAL | 0 refills | Status: AC

## 2018-01-07 NOTE — Progress Notes (Signed)
Denies allergies to eggs or soy products. Denies complication of anesthesia or sedation. Denies use of weight loss medication. Denies use of O2.   Emmi instructions declined.   Patient was recently diagnosed with Diabetes, she is scheduled to see her physician and start medication prior to the procedure. Patient states that she will most likely start oral medication. Oral medications were added to her instructions. Patient was instructed to call us if she has Long acting or short acting medications and we will instruct her on how to take before the procedure.

## 2018-01-09 ENCOUNTER — Ambulatory Visit (INDEPENDENT_AMBULATORY_CARE_PROVIDER_SITE_OTHER): Payer: Medicare Other | Admitting: Family Medicine

## 2018-01-09 VITALS — BP 134/81 | HR 67 | Temp 97.9°F | Ht 65.0 in | Wt 258.0 lb

## 2018-01-09 DIAGNOSIS — E559 Vitamin D deficiency, unspecified: Secondary | ICD-10-CM

## 2018-01-09 DIAGNOSIS — E1165 Type 2 diabetes mellitus with hyperglycemia: Secondary | ICD-10-CM | POA: Diagnosis not present

## 2018-01-09 DIAGNOSIS — Z6841 Body Mass Index (BMI) 40.0 and over, adult: Secondary | ICD-10-CM

## 2018-01-09 DIAGNOSIS — E038 Other specified hypothyroidism: Secondary | ICD-10-CM

## 2018-01-09 MED ORDER — METFORMIN HCL 500 MG PO TABS: 500.0000 mg | ORAL_TABLET | Freq: Every day | ORAL | 0 refills | Status: DC

## 2018-01-09 MED ORDER — VITAMIN D (ERGOCALCIFEROL) 1.25 MG (50000 UNIT) PO CAPS: 50000.0000 [IU] | ORAL_CAPSULE | ORAL | 0 refills | Status: DC

## 2018-01-09 MED ORDER — LEVOTHYROXINE SODIUM 112 MCG PO TABS: 112.0000 ug | ORAL_TABLET | Freq: Every day | ORAL | 0 refills | Status: DC

## 2018-01-14 NOTE — Progress Notes (Signed)
Office: [phone]  /  Fax: [phone]   HPI:   Chief Complaint: OBESITY Amy Mahoney is here to discuss her progress with her obesity treatment plan. She is following the Category 2 plan and is following her eating plan approximately 100 % of the time. She states she is doing cardio 30  minutes 3 times per week. Amy Mahoney is keeping food logs to ensure that she is getting all her food in. She has noticed some constipation. She denies hunger and she is not concerned about Thanksgiving temptation.   Her weight is 258 lb (117 kg) today and has had a weight loss of 6 pounds over a period of 2 weeks since her last visit. She has lost 6 lbs since starting treatment with [name].  Vitamin D Deficiency Amy Mahoney was informed that low vitamin D levels contributes to fatigue and are associated with obesity, breast, and colon cancer. She has been taking OTC Vitamin D but complains of fatigue. Amy Mahoney agrees to start taking prescription Vitamin D 50,000 iu once weekly #4 with no refills. She will follow up for routine testing of vitamin D, at least 2-3 times per year. She was informed of the risk of over-replacement of vitamin D and agrees to not increase her dose unless she discusses this with [name] first.   Hypothyroid Amy Mahoney has a diagnosis of hypothyroidism. She is on levothyroxine. She denies hot or cold intolerance or palpitations, but does admit to ongoing fatigue.   Diabetes II Amy Mahoney has a diagnosis of diabetes type II. Amy Mahoney states she has had hyperglycemic episodes on insulin and denies any hypoglycemic episodes. Last A1c was Hemoglobin A1C Latest Ref Rng & Units 01/01/2018 10/17/2017 05/24/2017  HGBA1C 4.8 - 5.6 % 6.9(H) 7.4(H) 6.6(H)  Some recent data might be hidden    She has been working on intensive lifestyle modifications including diet, exercise, and weight loss to help control her blood glucose levels. Amy Mahoney just started an ACE inhibitor and she is currently on a statin medication.    ALLERGIES: No Known  Allergies  MEDICATIONS: Current Outpatient Medications on File Prior to Visit  Medication Sig Dispense Refill  . atorvastatin (LIPITOR) 40 MG tablet TAKE 1 TABLET BY MOUTH  DAILY 90 tablet 1  . Calcium Carbonate-Vitamin D (CALCIUM 500 + D PO) Take 1 tablet by mouth 2 (two) times daily.      . fenofibrate 160 MG tablet TAKE 1 TABLET BY MOUTH  DAILY 90 tablet 1  . lisinopril (PRINIVIL,ZESTRIL) 5 MG tablet Take 1 tablet (5 mg total) by mouth daily. 30 tablet 0  . Multiple Vitamin (MULTIVITAMIN) tablet Take 1 tablet by mouth daily.      Amy Mahoney Kitchen venlafaxine XR (EFFEXOR-XR) 150 MG 24 hr capsule TAKE 1 CAPSULE BY MOUTH  DAILY 90 capsule 1  . vitamin E 400 UNIT capsule Take 400 Units by mouth daily.     No current facility-administered medications on file prior to visit.     PAST MEDICAL HISTORY: Past Medical History:  Diagnosis Date  . Anxiety   . Back pain   . Constipation   . Depression   . Diabetes mellitus without complication ([place])   . Gallbladder problem   . Heart murmur   . Hyperlipidemia   . Hypertension   . Prediabetes   . Sleep apnea    On CPAP  . Spider bite   . Swelling   . Thyroid disease    hypothyroidism    PAST SURGICAL HISTORY: Past Surgical History:  Procedure Laterality  Date  . CHOLECYSTECTOMY    . DILATION AND CURETTAGE OF UTERUS    . ESOPHAGOGASTRODUODENOSCOPY (EGD) WITH PROPOFOL N/A 09/05/2017   Procedure: ESOPHAGOGASTRODUODENOSCOPY (EGD) WITH PROPOFOL;  Surgeon: Jerene Bears, MD;  Location: [facility];  Service: Gastroenterology;  Laterality: N/A;  . fibroids scrapping    . TONSILLECTOMY      SOCIAL HISTORY: Social History   Tobacco Use  . Smoking status: Former Smoker    Packs/day: 0.50    Years: 3.00    Pack years: 1.50    Types: Cigarettes    Last attempt to quit: 08/10/1990    Years since quitting: 27.4  . Smokeless tobacco: Never Used  Substance Use Topics  . Alcohol use: Yes    Comment: rarely  . Drug use: No    FAMILY  HISTORY: Family History  Problem Relation Age of Onset  . Diabetes Mother   . Thyroid disease Mother   . Depression Mother   . Depression Unknown   . Hyperlipidemia Unknown   . Arthritis Unknown   . Diabetes Maternal Aunt   . Breast cancer Paternal Grandmother   . Lung cancer Maternal Aunt        Nonsmoker  . Cancer Maternal Aunt        breast  . Breast cancer Maternal Aunt   . Cancer Maternal Aunt        lung(nonsmoker)  . Colon cancer Neg Hx   . Pancreatic cancer Neg Hx   . Stomach cancer Neg Hx   . Esophageal cancer Neg Hx   . Rectal cancer Neg Hx     ROS: Review of Systems  Constitutional: Positive for malaise/fatigue and weight loss.  Cardiovascular: Negative for palpitations.  Gastrointestinal: Positive for constipation.  Neurological:       Negative for heat intolerance Negative for cold intolerance   Endo/Heme/Allergies:       Positive for hyperglycemia Negative for hypoglycemia     PHYSICAL EXAM: Blood pressure 134/81, pulse 67, temperature 97.9 F (36.6 C), temperature source Oral, height 5\' 5"  (1.651 m), weight 258 lb (117 kg), SpO2 98 %. Body mass index is 42.93 kg/m. Physical Exam  Constitutional: She is oriented to person, place, and time. She appears well-developed and well-nourished.  Cardiovascular: Normal rate.  Pulmonary/Chest: Effort normal.  Musculoskeletal: Normal range of motion.  Neurological: She is alert and oriented to person, place, and time.  Skin: Skin is warm and dry.  Psychiatric: She has a normal mood and affect. Her behavior is normal.  Vitals reviewed.   RECENT LABS AND TESTS: BMET    Component Value Date/Time   NA 142 01/01/2018 1136   K 4.5 01/01/2018 1136   CL 102 01/01/2018 1136   CO2 24 01/01/2018 1136   GLUCOSE 111 (H) 01/01/2018 1136   GLUCOSE 150 (H) 10/17/2017 1416   BUN 18 01/01/2018 1136   CREATININE 0.89 01/01/2018 1136   CALCIUM 9.6 01/01/2018 1136   GFRNONAA 67 01/01/2018 1136   GFRAA 77 01/01/2018  1136   Lab Results  Component Value Date   HGBA1C 6.9 (H) 01/01/2018   HGBA1C 7.4 (H) 10/17/2017   HGBA1C 6.6 (H) 05/24/2017   HGBA1C 6.7 (H) 06/14/2016   HGBA1C 6.2 01/07/2014   Lab Results  Component Value Date   INSULIN 14.9 01/01/2018   CBC    Component Value Date/Time   WBC 6.2 01/01/2018 1136   WBC 6.1 10/17/2017 1416   RBC 4.21 01/01/2018 1136   RBC 4.24 10/17/2017 1416  HGB 12.5 01/01/2018 1136   HCT 38.0 01/01/2018 1136   PLT 305 01/01/2018 1136   MCV 90 01/01/2018 1136   MCH 29.7 01/01/2018 1136   MCH 30.6 12/26/2013 1540   MCHC 32.9 01/01/2018 1136   MCHC 33.4 10/17/2017 1416   RDW 12.5 01/01/2018 1136   LYMPHSABS 2.3 01/01/2018 1136   MONOABS 0.5 10/17/2017 1416   EOSABS 0.4 01/01/2018 1136   BASOSABS 0.1 01/01/2018 1136   Iron/TIBC/Ferritin/ %Sat No results found for: IRON, TIBC, FERRITIN, IRONPCTSAT Lipid Panel     Component Value Date/Time   CHOL 132 01/01/2018 1136   TRIG 100 01/01/2018 1136   HDL 39 (L) 01/01/2018 1136   CHOLHDL 3.4 01/01/2018 1136   CHOLHDL 3 10/17/2017 1416   VLDL 33.0 10/17/2017 1416   LDLCALC 73 01/01/2018 1136   LDLDIRECT 193.3 11/06/2012 0926   Hepatic Function Panel     Component Value Date/Time   PROT 6.8 01/01/2018 1136   ALBUMIN 4.4 01/01/2018 1136   AST 27 01/01/2018 1136   ALT 21 01/01/2018 1136   ALKPHOS 54 01/01/2018 1136   BILITOT 0.3 01/01/2018 1136   BILIDIR 0.1 08/09/2014 1628      Component Value Date/Time   TSH 6.010 (H) 01/01/2018 1136   TSH 2.21 10/17/2017 1416   TSH 1.50 10/08/2016 1128  Results for AIZLYN, SCHIFANO (MRN [id number]) as of 01/14/2018 09:25  Ref. Range 01/01/2018 11:36  Vitamin D, 25-Hydroxy Latest Ref Range: 30.0 - 100.0 ng/mL 24.0 (L)    ASSESSMENT AND PLAN: Vitamin D deficiency - Plan: Vitamin D, Ergocalciferol, (DRISDOL) 1.25 MG (50000 UT) CAPS capsule  Other specified hypothyroidism - Plan: levothyroxine (SYNTHROID, LEVOTHROID) 112 MCG tablet  Type 2 diabetes mellitus  with hyperglycemia, without long-term current use of insulin ([facility]) - Plan: metFORMIN (GLUCOPHAGE) 500 MG tablet  Class 3 severe obesity with serious comorbidity and body mass index (BMI) of 40.0 to 44.9 in adult, unspecified obesity type ([place])  PLAN: Vitamin D Deficiency Cherith was informed that low vitamin D levels contributes to fatigue and are associated with obesity, breast, and colon cancer. She agrees to start taking prescription Vit D @50 ,000 IU every week #4 with no refills. Amy Mahoney will follow up for routine testing of vitamin D, at least 2-3 times per year. She was informed of the risk of over-replacement of vitamin D and agrees to not increase her dose unless she discusses this with [name] first. Amy Mahoney agrees to follow up with our office in 2 weeks.   Hypothyroid Amy Mahoney was informed of the importance of good thyroid control to help with weight loss efforts. She was also informed that supertheraputic thyroid levels are dangerous and will not improve weight loss results. Amy Mahoney agreed to increase her Synthroid dose to 112 mcg daily #30 with no refills. Amy Mahoney agrees to follow up with our office in 2 weeks.   Diabetes II Amy Mahoney has been given extensive diabetes education by myself today including ideal fasting and post-prandial blood glucose readings, individual ideal HgA1c goals  and hypoglycemia prevention. We discussed the importance of good blood sugar control to decrease the likelihood of diabetic complications such as nephropathy, neuropathy, limb loss, blindness, coronary artery disease, and death. We discussed the importance of intensive lifestyle modification including diet, exercise and weight loss as the first line treatment for diabetes. Amy Mahoney agrees to start Metformin 500 mg daily #30 with no refills.  She will continue her Lisinopril and Statin. Stefanny agrees to follow up in our office in 2  weeks   Obesity Amy Mahoney is currently in the action stage of change. As such, her goal is to continue with  weight loss efforts She has agreed to follow the Category 2 plan + 50 calories Amy Mahoney has been instructed to work up to a goal of 150 minutes of combined cardio and strengthening exercise per week for weight loss and overall health benefits. We discussed the following Behavioral Modification Stratagies today: increasing lean protein intake, increasing vegetables, work on meal planning and easy cooking plans and holiday eating strategies   Amy Mahoney has agreed to follow up with our clinic in 2 weeks. She was informed of the importance of frequent follow up visits to maximize her success with intensive lifestyle modifications for her multiple health conditions.   OBESITY BEHAVIORAL INTERVENTION VISIT  Today's visit was # 2   Starting weight: 264 lbs Starting date: 01/01/2018 Today's weight : Weight: 258 lb (117 kg)  Today's date: 01/09/2018 Total lbs lost to date: 6 lbs At least 15 minutes were spent on discussing the following behavioral intervention visit.   ASK: We discussed the diagnosis of obesity with Amy Mahoney today and Amy Mahoney agreed to give [name] permission to discuss obesity behavioral modification therapy today.  ASSESS: Amy Mahoney has the diagnosis of obesity and her BMI today is 42.93 Amy Mahoney is in the action stage of change   ADVISE: Amy Mahoney was educated on the multiple health risks of obesity as well as the benefit of weight loss to improve her health. She was advised of the need for long term treatment and the importance of lifestyle modifications to improve her current health and to decrease her risk of future health problems.  AGREE: Multiple dietary modification options and treatment options were discussed and  Amy Mahoney agreed to follow the recommendations documented in the above note.  ARRANGE: Amy Mahoney was educated on the importance of frequent visits to treat obesity as outlined per CMS and USPSTF guidelines and agreed to schedule her next follow up appointment today.  Amy Mahoney [place],  CMA, am acting as transcriptionist for Ilene Qua, MD.  I have reviewed the above documentation for accuracy and completeness, and I agree with the above. - Ilene Qua, MD

## 2018-01-21 ENCOUNTER — Encounter: Payer: Self-pay | Admitting: Internal Medicine

## 2018-01-21 ENCOUNTER — Ambulatory Visit (AMBULATORY_SURGERY_CENTER): Payer: Medicare Other | Admitting: Internal Medicine

## 2018-01-21 VITALS — BP 114/57 | HR 60 | Temp 97.8°F | Resp 18 | Ht 65.0 in | Wt 265.0 lb

## 2018-01-21 DIAGNOSIS — Z86010 Personal history of colonic polyps: Secondary | ICD-10-CM

## 2018-01-21 MED ORDER — SODIUM CHLORIDE 0.9 % IV SOLN: 500.0000 mL | Freq: Once | INTRAVENOUS | Status: DC

## 2018-01-21 NOTE — Progress Notes (Signed)
Report to PACU, RN, vss, BBS= Clear.  

## 2018-01-21 NOTE — Patient Instructions (Signed)
YOU HAD AN ENDOSCOPIC PROCEDURE TODAY AT THE [facility]:   Refer to the procedure report that was given to you for any specific questions about what was found during the examination.  If the procedure report does not answer your questions, please call your gastroenterologist to clarify.  If you requested that your care partner not be given the details of your procedure findings, then the procedure report has been included in a sealed envelope for you to review at your convenience later.  YOU SHOULD EXPECT: Some feelings of bloating in the abdomen. Passage of more gas than usual.  Walking can help get rid of the air that was put into your GI tract during the procedure and reduce the bloating. If you had a lower endoscopy (such as a colonoscopy or flexible sigmoidoscopy) you may notice spotting of blood in your stool or on the toilet paper. If you underwent a bowel prep for your procedure, you may not have a normal bowel movement for a few days.  Please Note:  You might notice some irritation and congestion in your nose or some drainage.  This is from the oxygen used during your procedure.  There is no need for concern and it should clear up in a day or so.  SYMPTOMS TO REPORT IMMEDIATELY:   Following lower endoscopy (colonoscopy or flexible sigmoidoscopy):  Excessive amounts of blood in the stool  Significant tenderness or worsening of abdominal pains  Swelling of the abdomen that is new, acute  Fever of 100F or higher  For urgent or emergent issues, a gastroenterologist can be reached at any hour by calling [phone].   DIET:  We do recommend a small meal at first, but then you may proceed to your regular diet.  Drink plenty of fluids but you should avoid alcoholic beverages for 24 hours.  ACTIVITY:  You should plan to take it easy for the rest of today and you should NOT DRIVE or use heavy machinery until tomorrow (because of the sedation medicines used during the test).     FOLLOW UP: Our staff will call the number listed on your records the next business day following your procedure to check on you and address any questions or concerns that you may have regarding the information given to you following your procedure. If we do not reach you, we will leave a message.  However, if you are feeling well and you are not experiencing any problems, there is no need to return our call.  We will assume that you have returned to your regular daily activities without incident.  If any biopsies were taken you will be contacted by phone or by letter within the next 1-3 weeks.  Please call us at [phone] if you have not heard about the biopsies in 3 weeks.    SIGNATURES/CONFIDENTIALITY: You and/or your care partner have signed paperwork which will be entered into your electronic medical record.  These signatures attest to the fact that that the information above on your After Visit Summary has been reviewed and is understood.  Full responsibility of the confidentiality of this discharge information lies with you and/or your care-partner. 

## 2018-01-21 NOTE — Op Note (Signed)
[facility] Patient Name: Amy Mahoney Procedure Date: 01/21/2018 11:13 AM MRN: [id number] Endoscopist: Jerene Bears , MD Age: 67 Referring MD:  Date of Birth: [birth year] Gender: Female Account #: [id number] Procedure:                Colonoscopy Indications:              Surveillance: Personal history of adenomatous                            polyps on last colonoscopy 5 years ago Medicines:                Monitored Anesthesia Care Procedure:                Pre-Anesthesia Assessment:                           - Prior to the procedure, a History and Physical                            was performed, and patient medications and                            allergies were reviewed. The patient's tolerance of                            previous anesthesia was also reviewed. The risks                            and benefits of the procedure and the sedation                            options and risks were discussed with the patient.                            All questions were answered, and informed consent                            was obtained. Prior Anticoagulants: The patient has                            taken no previous anticoagulant or antiplatelet                            agents. ASA Grade Assessment: III - A patient with                            severe systemic disease. After reviewing the risks                            and benefits, the patient was deemed in                            satisfactory condition to undergo the procedure.  After obtaining informed consent, the colonoscope                            was passed under direct vision. Throughout the                            procedure, the patient's blood pressure, pulse, and                            oxygen saturations were monitored continuously. The                            Colonoscope was introduced through the anus and                            advanced to the cecum,  identified by appendiceal                            orifice and ileocecal valve. The quality of the                            bowel preparation was good. The ileocecal valve,                            appendiceal orifice, and rectum were photographed. Scope In: 11:08:36 AM Scope Out: 11:25:45 AM Scope Withdrawal Time: 0 hours 13 minutes 3 seconds  Total Procedure Duration: 0 hours 17 minutes 9 seconds  Findings:                 The digital rectal exam was normal.                           The colon (entire examined portion) appeared normal.                           Internal hemorrhoids were found during                            retroflexion. The hemorrhoids were small. Complications:            No immediate complications. Estimated Blood Loss:     Estimated blood loss: none. Impression:               - The entire examined colon is normal.                           - Internal hemorrhoids.                           - No specimens collected. Recommendation:           - Patient has a contact number available for                            emergencies. The signs and symptoms of potential  delayed complications were discussed with the                            patient. Return to normal activities tomorrow.                            Written discharge instructions were provided to the                            patient.                           - Resume previous diet.                           - Continue present medications.                           - Repeat colonoscopy in 10 years for surveillance. Jerene Bears, MD 01/21/2018 11:33:35 AM This report has been signed electronically.

## 2018-01-22 ENCOUNTER — Telehealth: Payer: Self-pay | Admitting: *Deleted

## 2018-01-22 ENCOUNTER — Ambulatory Visit (INDEPENDENT_AMBULATORY_CARE_PROVIDER_SITE_OTHER): Payer: Medicare Other | Admitting: Family Medicine

## 2018-01-22 NOTE — Telephone Encounter (Signed)
  Follow up Call-  Call back number 01/21/2018 07/30/2017  Post procedure Call Back phone  # [phone]  Permission to leave phone message Yes Yes  Some recent data might be hidden     Patient questions:  Do you have a fever, pain , or abdominal swelling? No. Pain Score  0 *  Have you tolerated food without any problems? Yes.    Have you been able to return to your normal activities? Yes.    Do you have any questions about your discharge instructions: Diet   No. Medications  No. Follow up visit  No.  Do you have questions or concerns about your Care? No.  Actions: * If pain score is 4 or above: No action needed, pain <4.

## 2018-01-29 ENCOUNTER — Ambulatory Visit (INDEPENDENT_AMBULATORY_CARE_PROVIDER_SITE_OTHER): Payer: Medicare Other | Admitting: Family Medicine

## 2018-01-29 ENCOUNTER — Encounter (INDEPENDENT_AMBULATORY_CARE_PROVIDER_SITE_OTHER): Payer: Self-pay | Admitting: Family Medicine

## 2018-01-29 VITALS — BP 111/73 | HR 67 | Temp 97.7°F | Ht 65.0 in | Wt 255.0 lb

## 2018-01-29 DIAGNOSIS — E559 Vitamin D deficiency, unspecified: Secondary | ICD-10-CM | POA: Diagnosis not present

## 2018-01-29 DIAGNOSIS — E1165 Type 2 diabetes mellitus with hyperglycemia: Secondary | ICD-10-CM | POA: Diagnosis not present

## 2018-01-29 DIAGNOSIS — E038 Other specified hypothyroidism: Secondary | ICD-10-CM

## 2018-01-29 DIAGNOSIS — Z6841 Body Mass Index (BMI) 40.0 and over, adult: Secondary | ICD-10-CM | POA: Diagnosis not present

## 2018-01-29 MED ORDER — METFORMIN HCL 500 MG PO TABS: 500.0000 mg | ORAL_TABLET | Freq: Every day | ORAL | 0 refills | Status: DC

## 2018-01-29 MED ORDER — LISINOPRIL 5 MG PO TABS: 5.0000 mg | ORAL_TABLET | Freq: Every day | ORAL | 0 refills | Status: DC

## 2018-01-29 MED ORDER — LEVOTHYROXINE SODIUM 112 MCG PO TABS: 112.0000 ug | ORAL_TABLET | Freq: Every day | ORAL | 0 refills | Status: DC

## 2018-01-29 MED ORDER — VITAMIN D (ERGOCALCIFEROL) 1.25 MG (50000 UNIT) PO CAPS: 50000.0000 [IU] | ORAL_CAPSULE | ORAL | 0 refills | Status: DC

## 2018-01-29 NOTE — Progress Notes (Signed)
Office: [phone]  /  Fax: [phone]   HPI:   Chief Complaint: OBESITY Amy Mahoney is here to discuss her progress with her obesity treatment plan. She is on the Category 2 plan + 50 calories and is following her eating plan approximately 90 % of the time. She states she is on the treadmill and bike riding for 30 minutes 3 times per week. Amy Mahoney had an enjoyable Thanksgiving, she only indulged in green bean casserole. She had a colonoscopy Tuesday after Thanksgiving. She is getting all food in and getting between 6-8 oz of meat at dinner. She denies hunger. Her weight is 255 lb (115.7 kg) today and has had a weight loss of 3 pounds over a period of 3 weeks since her last visit. She has lost 9 lbs since starting treatment with [name].  Hypothyroidism Amy Mahoney has a diagnosis of hypothyroidism. She is on levothyroxine. She denies hot or cold intolerance or palpitations, but does admit to ongoing fatigue.  Vitamin D Deficiency Amy Mahoney has a diagnosis of vitamin D deficiency. She is currently taking prescription Vit D. She notes fatigue and denies nausea, vomiting or muscle weakness.  Diabetes II with Hyperglycemia Amy Mahoney has a diagnosis of diabetes type II. Amy Mahoney denies GI side effects of metformin and denies hypoglycemia. Last A1c was 6.9. She has been working on intensive lifestyle modifications including diet, exercise, and weight loss to help control her blood glucose levels.  ALLERGIES: No Known Allergies  MEDICATIONS: Current Outpatient Medications on File Prior to Visit  Medication Sig Dispense Refill  . atorvastatin (LIPITOR) 40 MG tablet TAKE 1 TABLET BY MOUTH  DAILY 90 tablet 1  . Calcium Carbonate-Vitamin D (CALCIUM 500 + D PO) Take 1 tablet by mouth 2 (two) times daily.      . fenofibrate 160 MG tablet TAKE 1 TABLET BY MOUTH  DAILY 90 tablet 1  . levothyroxine (SYNTHROID, LEVOTHROID) 112 MCG tablet Take 1 tablet (112 mcg total) by mouth daily before breakfast. 30 tablet 0  . lisinopril  (PRINIVIL,ZESTRIL) 5 MG tablet Take 1 tablet (5 mg total) by mouth daily. 30 tablet 0  . metFORMIN (GLUCOPHAGE) 500 MG tablet Take 1 tablet (500 mg total) by mouth daily with breakfast. 30 tablet 0  . Multiple Vitamin (MULTIVITAMIN) tablet Take 1 tablet by mouth daily.      [name] venlafaxine XR (EFFEXOR-XR) 150 MG 24 hr capsule TAKE 1 CAPSULE BY MOUTH  DAILY 90 capsule 1  . Vitamin D, Ergocalciferol, (DRISDOL) 1.25 MG (50000 UT) CAPS capsule Take 1 capsule (50,000 Units total) by mouth every 7 (seven) days. 4 capsule 0  . vitamin E 400 UNIT capsule Take 400 Units by mouth daily.     No current facility-administered medications on file prior to visit.     PAST MEDICAL HISTORY: Past Medical History:  Diagnosis Date  . Anxiety   . Back pain   . Constipation   . Depression   . Diabetes mellitus without complication ([place])   . Gallbladder problem   . Heart murmur   . Hyperlipidemia   . Hypertension   . Prediabetes   . Sleep apnea    On CPAP  . Spider bite   . Swelling   . Thyroid disease    hypothyroidism    PAST SURGICAL HISTORY: Past Surgical History:  Procedure Laterality Date  . CHOLECYSTECTOMY    . DILATION AND CURETTAGE OF UTERUS    . ESOPHAGOGASTRODUODENOSCOPY (EGD) WITH PROPOFOL N/A 09/05/2017   Procedure: ESOPHAGOGASTRODUODENOSCOPY (EGD) WITH PROPOFOL;  Surgeon: Jerene Bears, MD;  Location: [facility];  Service: Gastroenterology;  Laterality: N/A;  . fibroids scrapping    . TONSILLECTOMY      SOCIAL HISTORY: Social History   Tobacco Use  . Smoking status: Former Smoker    Packs/day: 0.50    Years: 3.00    Pack years: 1.50    Types: Cigarettes    Last attempt to quit: 08/10/1990    Years since quitting: 27.4  . Smokeless tobacco: Never Used  Substance Use Topics  . Alcohol use: Yes    Comment: rarely  . Drug use: No    FAMILY HISTORY: Family History  Problem Relation Age of Onset  . Diabetes Mother   . Thyroid disease Mother   . Depression Mother   .  Depression Unknown   . Hyperlipidemia Unknown   . Arthritis Unknown   . Diabetes Maternal Aunt   . Breast cancer Paternal Grandmother   . Lung cancer Maternal Aunt        Nonsmoker  . Cancer Maternal Aunt        breast  . Breast cancer Maternal Aunt   . Cancer Maternal Aunt        lung(nonsmoker)  . Colon cancer Neg Hx   . Pancreatic cancer Neg Hx   . Stomach cancer Neg Hx   . Esophageal cancer Neg Hx   . Rectal cancer Neg Hx     ROS: Review of Systems  Constitutional: Positive for malaise/fatigue and weight loss.  Cardiovascular: Negative for palpitations.  Gastrointestinal: Negative for nausea and vomiting.  Musculoskeletal:       Negative muscle weakness  Endo/Heme/Allergies:       Negative hypoglycemia Negative hot/cold intolerance    PHYSICAL EXAM: Blood pressure 111/73, pulse 67, temperature 97.7 F (36.5 C), temperature source Oral, height 5\' 5"  (1.651 m), weight 255 lb (115.7 kg), SpO2 98 %. Body mass index is 42.43 kg/m. Physical Exam  Constitutional: She is oriented to person, place, and time. She appears well-developed and well-nourished.  Cardiovascular: Normal rate.  Pulmonary/Chest: Effort normal.  Musculoskeletal: Normal range of motion.  Neurological: She is oriented to person, place, and time.  Skin: Skin is warm and dry.  Psychiatric: She has a normal mood and affect. Her behavior is normal.  Vitals reviewed.   RECENT LABS AND TESTS: BMET    Component Value Date/Time   NA 142 01/01/2018 1136   K 4.5 01/01/2018 1136   CL 102 01/01/2018 1136   CO2 24 01/01/2018 1136   GLUCOSE 111 (H) 01/01/2018 1136   GLUCOSE 150 (H) 10/17/2017 1416   BUN 18 01/01/2018 1136   CREATININE 0.89 01/01/2018 1136   CALCIUM 9.6 01/01/2018 1136   GFRNONAA 67 01/01/2018 1136   GFRAA 77 01/01/2018 1136   Lab Results  Component Value Date   HGBA1C 6.9 (H) 01/01/2018   HGBA1C 7.4 (H) 10/17/2017   HGBA1C 6.6 (H) 05/24/2017   HGBA1C 6.7 (H) 06/14/2016   HGBA1C  6.2 01/07/2014   Lab Results  Component Value Date   INSULIN 14.9 01/01/2018   CBC    Component Value Date/Time   WBC 6.2 01/01/2018 1136   WBC 6.1 10/17/2017 1416   RBC 4.21 01/01/2018 1136   RBC 4.24 10/17/2017 1416   HGB 12.5 01/01/2018 1136   HCT 38.0 01/01/2018 1136   PLT 305 01/01/2018 1136   MCV 90 01/01/2018 1136   MCH 29.7 01/01/2018 1136   MCH 30.6 12/26/2013 1540   MCHC  32.9 01/01/2018 1136   MCHC 33.4 10/17/2017 1416   RDW 12.5 01/01/2018 1136   LYMPHSABS 2.3 01/01/2018 1136   MONOABS 0.5 10/17/2017 1416   EOSABS 0.4 01/01/2018 1136   BASOSABS 0.1 01/01/2018 1136   Iron/TIBC/Ferritin/ %Sat No results found for: IRON, TIBC, FERRITIN, IRONPCTSAT Lipid Panel     Component Value Date/Time   CHOL 132 01/01/2018 1136   TRIG 100 01/01/2018 1136   HDL 39 (L) 01/01/2018 1136   CHOLHDL 3.4 01/01/2018 1136   CHOLHDL 3 10/17/2017 1416   VLDL 33.0 10/17/2017 1416   LDLCALC 73 01/01/2018 1136   LDLDIRECT 193.3 11/06/2012 0926   Hepatic Function Panel     Component Value Date/Time   PROT 6.8 01/01/2018 1136   ALBUMIN 4.4 01/01/2018 1136   AST 27 01/01/2018 1136   ALT 21 01/01/2018 1136   ALKPHOS 54 01/01/2018 1136   BILITOT 0.3 01/01/2018 1136   BILIDIR 0.1 08/09/2014 1628      Component Value Date/Time   TSH 6.010 (H) 01/01/2018 1136   TSH 2.21 10/17/2017 1416   TSH 1.50 10/08/2016 1128  Results for DARLEN, GLEDHILL (MRN [id number]) as of 01/29/2018 12:31  Ref. Range 01/01/2018 11:36  Vitamin D, 25-Hydroxy Latest Ref Range: 30.0 - 100.0 ng/mL 24.0 (L)    ASSESSMENT AND PLAN: Other specified hypothyroidism - Plan: levothyroxine (SYNTHROID, LEVOTHROID) 112 MCG tablet  Vitamin D deficiency - Plan: Vitamin D, Ergocalciferol, (DRISDOL) 1.25 MG (50000 UT) CAPS capsule  Type 2 diabetes mellitus with hyperglycemia, without long-term current use of insulin (HCC) - Plan: metFORMIN (GLUCOPHAGE) 500 MG tablet, lisinopril (PRINIVIL,ZESTRIL) 5 MG tablet  Class 3  severe obesity with serious comorbidity and body mass index (BMI) of 40.0 to 44.9 in adult, unspecified obesity type ([place])  PLAN:  Hypothyroidism Kenadi was informed of the importance of good thyroid control to help with weight loss efforts. She was also informed that supertheraputic thyroid levels are dangerous and will not improve weight loss results. Seleena agrees to continue taking levothyroxine 112 mcg q AM #30 and we will refill for 1 month. Caedence agrees to follow up with our clinic in 3 weeks.  Vitamin D Deficiency Ilyanna was informed that low vitamin D levels contributes to fatigue and are associated with obesity, breast, and colon cancer. Lurene agrees to continue taking prescription Vit D @50 ,000 IU every week #4 and we will refill for 1 month. She will follow up for routine testing of vitamin D, at least 2-3 times per year. She was informed of the risk of over-replacement of vitamin D and agrees to not increase her dose unless she discusses this with [name] first. Maryln agrees to follow up with our clinic in 3 weeks.  Diabetes II with Hyperglycemia Tsion has been given extensive diabetes education by myself today including ideal fasting and post-prandial blood glucose readings, individual ideal Hgb A1c goals and hypoglycemia prevention. We discussed the importance of good blood sugar control to decrease the likelihood of diabetic complications such as nephropathy, neuropathy, limb loss, blindness, coronary artery disease, and death. We discussed the importance of intensive lifestyle modification including diet, exercise and weight loss as the first line treatment for diabetes. Lillyahna agrees to continue taking metformin 500 mg PO q AM #30 and we will refill for 1 month, and she agrees to continue taking lisinopril 5 mg PO q AM #30 and we will refill for 1 month. Fiona agrees to follow up with our clinic in 3 weeks.  Obesity Candice is currently  in the action stage of change. As such, her goal is to continue with  weight loss efforts She has agreed to follow the Category 2 plan + 50 calories Nikhita has been instructed to work up to a goal of 150 minutes of combined cardio and strengthening exercise per week for weight loss and overall health benefits. We discussed the following Behavioral Modification Strategies today: increasing lean protein intake, increasing vegetables, work on meal planning and easy cooking plans, holiday eating strategies, and planning for success    Kamaiyah has agreed to follow up with our clinic in 3 weeks. She was informed of the importance of frequent follow up visits to maximize her success with intensive lifestyle modifications for her multiple health conditions.   OBESITY BEHAVIORAL INTERVENTION VISIT  Today's visit was # 3   Starting weight: 264 lbs Starting date: 01/01/18 Today's weight : 255 lbs Today's date: 01/29/2018 Total lbs lost to date: 9 At least 15 minutes were spent on discussing the following behavioral intervention visit.   ASK: We discussed the diagnosis of obesity with [name] today and Jann agreed to give [name] permission to discuss obesity behavioral modification therapy today.  ASSESS: Earla has the diagnosis of obesity and her BMI today is 42.43 Celesta is in the action stage of change   ADVISE: Sharayah was educated on the multiple health risks of obesity as well as the benefit of weight loss to improve her health. She was advised of the need for long term treatment and the importance of lifestyle modifications to improve her current health and to decrease her risk of future health problems.  AGREE: Multiple dietary modification options and treatment options were discussed and  Alisson agreed to follow the recommendations documented in the above note.  ARRANGE: Leinani was educated on the importance of frequent visits to treat obesity as outlined per CMS and USPSTF guidelines and agreed to schedule her next follow up appointment today.  I, Trixie Dredge,  am acting as transcriptionist for Ilene Qua, MD  I have reviewed the above documentation for accuracy and completeness, and I agree with the above. - Ilene Qua, MD

## 2018-02-05 ENCOUNTER — Other Ambulatory Visit: Payer: Self-pay | Admitting: Family Medicine

## 2018-02-05 DIAGNOSIS — E785 Hyperlipidemia, unspecified: Secondary | ICD-10-CM

## 2018-02-20 ENCOUNTER — Ambulatory Visit (INDEPENDENT_AMBULATORY_CARE_PROVIDER_SITE_OTHER): Payer: Medicare Other | Admitting: Family Medicine

## 2018-02-20 ENCOUNTER — Encounter (INDEPENDENT_AMBULATORY_CARE_PROVIDER_SITE_OTHER): Payer: Self-pay | Admitting: Family Medicine

## 2018-02-20 VITALS — BP 117/76 | HR 77 | Temp 97.8°F | Ht 65.0 in | Wt 248.0 lb

## 2018-02-20 DIAGNOSIS — Z6841 Body Mass Index (BMI) 40.0 and over, adult: Secondary | ICD-10-CM | POA: Diagnosis not present

## 2018-02-20 DIAGNOSIS — E559 Vitamin D deficiency, unspecified: Secondary | ICD-10-CM | POA: Diagnosis not present

## 2018-02-20 DIAGNOSIS — E038 Other specified hypothyroidism: Secondary | ICD-10-CM | POA: Diagnosis not present

## 2018-02-20 DIAGNOSIS — E1165 Type 2 diabetes mellitus with hyperglycemia: Secondary | ICD-10-CM

## 2018-02-20 MED ORDER — LISINOPRIL 5 MG PO TABS: 5.0000 mg | ORAL_TABLET | Freq: Every day | ORAL | 0 refills | Status: DC

## 2018-02-20 MED ORDER — METFORMIN HCL 500 MG PO TABS: 500.0000 mg | ORAL_TABLET | Freq: Every day | ORAL | 0 refills | Status: DC

## 2018-02-20 MED ORDER — LEVOTHYROXINE SODIUM 112 MCG PO TABS: 112.0000 ug | ORAL_TABLET | Freq: Every day | ORAL | 0 refills | Status: DC

## 2018-02-20 MED ORDER — VITAMIN D (ERGOCALCIFEROL) 1.25 MG (50000 UNIT) PO CAPS: 50000.0000 [IU] | ORAL_CAPSULE | ORAL | 0 refills | Status: DC

## 2018-02-20 NOTE — Progress Notes (Signed)
Office: [phone]  /  Fax: [phone]   HPI:   Chief Complaint: OBESITY Amy Mahoney is here to discuss her progress with her obesity treatment plan. She is on the Category 2 plan + 50 calories and is following her eating plan approximately 95 % of the time. She states she is on the treadmill and bicycle for 30 minutes 3 times per week. Amy Mahoney enjoyed going off the plan on Christmas Eve and Christmas Day. She has been experimenting with various ways of putting all of the food together. She denies hunger.  Her weight is 248 lb (112.5 kg) today and has had a weight loss of 7 pounds over a period of 3 weeks since her last visit. She has lost 16 lbs since starting treatment with [name].  Diabetes II with Hyperglycemia Amy Mahoney has a diagnosis of diabetes type II. Terran is on ACE I, metformin, and statin. She denies GI side effects of metformin and she denies hypoglycemia. Last A1c was 6.9. She has been working on intensive lifestyle modifications including diet, exercise, and weight loss to help control her blood glucose levels.  Hypothyroidism Amy Mahoney has a diagnosis of hypothyroidism. She is on levothyroxine. She denies hot or cold intolerance or palpitations, but does admit to ongoing fatigue.  Vitamin D Deficiency Amy Mahoney has a diagnosis of vitamin D deficiency. She is currently taking prescription Vit D She notes fatigue and denies nausea, vomiting or muscle weakness.  ASSESSMENT AND PLAN:  Type 2 diabetes mellitus with hyperglycemia, without long-term current use of insulin (HCC) - Plan: lisinopril (PRINIVIL,ZESTRIL) 5 MG tablet, metFORMIN (GLUCOPHAGE) 500 MG tablet  Other specified hypothyroidism - Plan: levothyroxine (SYNTHROID, LEVOTHROID) 112 MCG tablet  Vitamin D deficiency - Plan: Vitamin D, Ergocalciferol, (DRISDOL) 1.25 MG (50000 UT) CAPS capsule  Class 3 severe obesity with serious comorbidity and body mass index (BMI) of 40.0 to 44.9 in adult, unspecified obesity type  ([place])  PLAN:  Diabetes II with Hyperglycemia Amy Mahoney has been given extensive diabetes education by myself today including ideal fasting and post-prandial blood glucose readings, individual ideal Hgb A1c goals and hypoglycemia prevention. We discussed the importance of good blood sugar control to decrease the likelihood of diabetic complications such as nephropathy, neuropathy, limb loss, blindness, coronary artery disease, and death. We discussed the importance of intensive lifestyle modification including diet, exercise and weight loss as the first line treatment for diabetes. Amy Mahoney agrees to continue taking lisinopril 5 mg PO q AM #30 and we will refill for 1 month, and she agrees to continue taking metformin 500 mg PO q AM #30 and we will refill for 1 month. Rozlyn agrees to follow up with our clinic in 2 weeks.  Hypothyroidism Amy Mahoney was informed of the importance of good thyroid control to help with weight loss efforts. She was also informed that supertheraputic thyroid levels are dangerous and will not improve weight loss results.  Amy Mahoney agrees to continue taking levothyroxine 112 mcg PO q AM #30 and we will refill for 1 month. Amy Mahoney agrees to follow up with our clinic in 2 weeks.  Vitamin D Deficiency Amy Mahoney was informed that low vitamin D levels contributes to fatigue and are associated with obesity, breast, and colon cancer. Amy Mahoney agrees to continue taking prescription Vit D @50 ,000 IU every week #4 and we will refill for 1 month. She will follow up for routine testing of vitamin D, at least 2-3 times per year. She was informed of the risk of over-replacement of vitamin D and agrees to not  increase her dose unless she discusses this with [name] first. Amy Mahoney agrees to follow up with our clinic in 2 weeks.  Obesity Amy Mahoney is currently in the action stage of change. As such, her goal is to continue with weight loss efforts She has agreed to follow the Category 2 plan + 50 calories Amy Mahoney has been instructed to  work up to a goal of 150 minutes of combined cardio and strengthening exercise per week for weight loss and overall health benefits. We discussed the following Behavioral Modification Strategies today: increasing lean protein intake, increasing vegetables, work on meal planning and easy cooking plans, holiday eating strategies, increase H20 intake, and planning for success   Amy Mahoney has agreed to follow up with our clinic in 2 weeks. She was informed of the importance of frequent follow up visits to maximize her success with intensive lifestyle modifications for her multiple health conditions.  ALLERGIES: No Known Allergies  MEDICATIONS: Current Outpatient Medications on File Prior to Visit  Medication Sig Dispense Refill  . atorvastatin (LIPITOR) 40 MG tablet TAKE 1 TABLET BY MOUTH  DAILY 90 tablet 1  . Calcium Carbonate-Vitamin D (CALCIUM 500 + D PO) Take 1 tablet by mouth 2 (two) times daily.      . fenofibrate 160 MG tablet TAKE 1 TABLET BY MOUTH  DAILY 90 tablet 1  . Multiple Vitamin (MULTIVITAMIN) tablet Take 1 tablet by mouth daily.      Amy Mahoney venlafaxine XR (EFFEXOR-XR) 150 MG 24 hr capsule TAKE 1 CAPSULE BY MOUTH  DAILY 90 capsule 1  . vitamin E 400 UNIT capsule Take 400 Units by mouth daily.     No current facility-administered medications on file prior to visit.     PAST MEDICAL HISTORY: Past Medical History:  Diagnosis Date  . Anxiety   . Back pain   . Constipation   . Depression   . Diabetes mellitus without complication ([place])   . Gallbladder problem   . Heart murmur   . Hyperlipidemia   . Hypertension   . Prediabetes   . Sleep apnea    On CPAP  . Spider bite   . Swelling   . Thyroid disease    hypothyroidism    PAST SURGICAL HISTORY: Past Surgical History:  Procedure Laterality Date  . CHOLECYSTECTOMY    . DILATION AND CURETTAGE OF UTERUS    . ESOPHAGOGASTRODUODENOSCOPY (EGD) WITH PROPOFOL N/A 09/05/2017   Procedure: ESOPHAGOGASTRODUODENOSCOPY (EGD) WITH  PROPOFOL;  Surgeon: Jerene Bears, MD;  Location: [facility];  Service: Gastroenterology;  Laterality: N/A;  . fibroids scrapping    . TONSILLECTOMY      SOCIAL HISTORY: Social History   Tobacco Use  . Smoking status: Former Smoker    Packs/day: 0.50    Years: 3.00    Pack years: 1.50    Types: Cigarettes    Last attempt to quit: 08/10/1990    Years since quitting: 27.5  . Smokeless tobacco: Never Used  Substance Use Topics  . Alcohol use: Yes    Comment: rarely  . Drug use: No    FAMILY HISTORY: Family History  Problem Relation Age of Onset  . Diabetes Mother   . Thyroid disease Mother   . Depression Mother   . Depression Unknown   . Hyperlipidemia Unknown   . Arthritis Unknown   . Diabetes Maternal Aunt   . Breast cancer Paternal Grandmother   . Lung cancer Maternal Aunt        Nonsmoker  . Cancer  Maternal Aunt        breast  . Breast cancer Maternal Aunt   . Cancer Maternal Aunt        lung(nonsmoker)  . Colon cancer Neg Hx   . Pancreatic cancer Neg Hx   . Stomach cancer Neg Hx   . Esophageal cancer Neg Hx   . Rectal cancer Neg Hx     ROS: Review of Systems  Constitutional: Positive for malaise/fatigue and weight loss.  Cardiovascular: Negative for palpitations.  Gastrointestinal: Negative for nausea and vomiting.  Musculoskeletal:       Negative muscle weakness  Endo/Heme/Allergies:       Negative hypoglycemia Negative hot/cold intolerance    PHYSICAL EXAM: Blood pressure 117/76, pulse 77, temperature 97.8 F (36.6 C), temperature source Oral, height 5\' 5"  (1.651 m), weight 248 lb (112.5 kg), SpO2 98 %. Body mass index is 41.27 kg/m. Physical Exam Vitals signs reviewed.  Constitutional:      Appearance: Normal appearance. She is obese.  Cardiovascular:     Rate and Rhythm: Normal rate.     Pulses: Normal pulses.  Pulmonary:     Effort: Pulmonary effort is normal.  Musculoskeletal: Normal range of motion.  Skin:    General: Skin is warm  and dry.  Neurological:     Mental Status: She is alert and oriented to person, place, and time.  Psychiatric:        Mood and Affect: Mood normal.        Behavior: Behavior normal.     RECENT LABS AND TESTS: BMET    Component Value Date/Time   NA 142 01/01/2018 1136   K 4.5 01/01/2018 1136   CL 102 01/01/2018 1136   CO2 24 01/01/2018 1136   GLUCOSE 111 (H) 01/01/2018 1136   GLUCOSE 150 (H) 10/17/2017 1416   BUN 18 01/01/2018 1136   CREATININE 0.89 01/01/2018 1136   CALCIUM 9.6 01/01/2018 1136   GFRNONAA 67 01/01/2018 1136   GFRAA 77 01/01/2018 1136   Lab Results  Component Value Date   HGBA1C 6.9 (H) 01/01/2018   HGBA1C 7.4 (H) 10/17/2017   HGBA1C 6.6 (H) 05/24/2017   HGBA1C 6.7 (H) 06/14/2016   HGBA1C 6.2 01/07/2014   Lab Results  Component Value Date   INSULIN 14.9 01/01/2018   CBC    Component Value Date/Time   WBC 6.2 01/01/2018 1136   WBC 6.1 10/17/2017 1416   RBC 4.21 01/01/2018 1136   RBC 4.24 10/17/2017 1416   HGB 12.5 01/01/2018 1136   HCT 38.0 01/01/2018 1136   PLT 305 01/01/2018 1136   MCV 90 01/01/2018 1136   MCH 29.7 01/01/2018 1136   MCH 30.6 12/26/2013 1540   MCHC 32.9 01/01/2018 1136   MCHC 33.4 10/17/2017 1416   RDW 12.5 01/01/2018 1136   LYMPHSABS 2.3 01/01/2018 1136   MONOABS 0.5 10/17/2017 1416   EOSABS 0.4 01/01/2018 1136   BASOSABS 0.1 01/01/2018 1136   Iron/TIBC/Ferritin/ %Sat No results found for: IRON, TIBC, FERRITIN, IRONPCTSAT Lipid Panel     Component Value Date/Time   CHOL 132 01/01/2018 1136   TRIG 100 01/01/2018 1136   HDL 39 (L) 01/01/2018 1136   CHOLHDL 3.4 01/01/2018 1136   CHOLHDL 3 10/17/2017 1416   VLDL 33.0 10/17/2017 1416   LDLCALC 73 01/01/2018 1136   LDLDIRECT 193.3 11/06/2012 0926   Hepatic Function Panel     Component Value Date/Time   PROT 6.8 01/01/2018 1136   ALBUMIN 4.4 01/01/2018 1136  AST 27 01/01/2018 1136   ALT 21 01/01/2018 1136   ALKPHOS 54 01/01/2018 1136   BILITOT 0.3 01/01/2018  1136   BILIDIR 0.1 08/09/2014 1628      Component Value Date/Time   TSH 6.010 (H) 01/01/2018 1136   TSH 2.21 10/17/2017 1416   TSH 1.50 10/08/2016 1128      OBESITY BEHAVIORAL INTERVENTION VISIT  Today's visit was # 4   Starting weight: 264 lbs Starting date: 01/01/18 Today's weight : 248 lbs  Today's date: 02/20/2018 Total lbs lost to date: 16 At least 15 minutes were spent on discussing the following behavioral intervention visit.   ASK: We discussed the diagnosis of obesity with [name] today and Loredana agreed to give [name] permission to discuss obesity behavioral modification therapy today.  ASSESS: Shannen has the diagnosis of obesity and her BMI today is 41.27 Georgi is in the action stage of change   ADVISE: Honour was educated on the multiple health risks of obesity as well as the benefit of weight loss to improve her health. She was advised of the need for long term treatment and the importance of lifestyle modifications to improve her current health and to decrease her risk of future health problems.  AGREE: Multiple dietary modification options and treatment options were discussed and  Dawsyn agreed to follow the recommendations documented in the above note.  ARRANGE: Issis was educated on the importance of frequent visits to treat obesity as outlined per CMS and USPSTF guidelines and agreed to schedule her next follow up appointment today.  I, Trixie Dredge, am acting as transcriptionist for Ilene Qua, MD  I have reviewed the above documentation for accuracy and completeness, and I agree with the above. - Ilene Qua, MD

## 2018-02-21 ENCOUNTER — Encounter: Payer: Self-pay | Admitting: Family Medicine

## 2018-02-21 ENCOUNTER — Ambulatory Visit (INDEPENDENT_AMBULATORY_CARE_PROVIDER_SITE_OTHER): Payer: Medicare Other | Admitting: Family Medicine

## 2018-02-21 VITALS — BP 116/70 | HR 65 | Temp 98.1°F | Resp 16 | Ht 65.0 in | Wt 252.6 lb

## 2018-02-21 DIAGNOSIS — H6521 Chronic serous otitis media, right ear: Secondary | ICD-10-CM

## 2018-02-21 MED ORDER — LORATADINE 10 MG PO TABS: 10.0000 mg | ORAL_TABLET | Freq: Every day | ORAL | 11 refills | Status: DC

## 2018-02-21 MED ORDER — FLUTICASONE PROPIONATE 50 MCG/ACT NA SUSP: 2.0000 | Freq: Every day | NASAL | 6 refills | Status: DC

## 2018-02-21 MED FILL — LORATADINE 10 MG TABLET: 10 | 100 days supply | Qty: 100 | Fill #0

## 2018-02-21 MED FILL — FLUTICASONE PROP 50 MCG SPR: 50 | 30 days supply | Qty: 16 | Fill #0

## 2018-02-21 NOTE — Patient Instructions (Signed)
You have fluid behind your ear drum.  I have sent flonase and claritin to the pharmacy Call us if symptoms do not improve in the next 7-10 days

## 2018-02-21 NOTE — Progress Notes (Signed)
Patient ID: Amy Mahoney, female    DOB: 09/25/1950  Age: 68 y.o. MRN: [id number]    Subjective:  Subjective  HPI Amy Mahoney presents for pressure and pain in the R ear x 6 weeks.  She went to [facility] to have her hearing aids checked and they felt she may have fluid in her ear.  No other symptoms.  No congestion, no fever.  They pain goes away when she takes her hearing aid out for a few days but she still feels the crackling.     Review of Systems  Constitutional: Negative for appetite change, diaphoresis, fatigue and unexpected weight change.  HENT: Positive for ear pain. Negative for congestion, postnasal drip, rhinorrhea, sinus pressure, sinus pain, sneezing, sore throat and tinnitus.   Eyes: Negative for pain, redness and visual disturbance.  Respiratory: Negative for cough, chest tightness, shortness of breath and wheezing.   Cardiovascular: Negative for chest pain, palpitations and leg swelling.  Endocrine: Negative for cold intolerance, heat intolerance, polydipsia, polyphagia and polyuria.  Genitourinary: Negative for difficulty urinating, dysuria and frequency.  Neurological: Negative for dizziness, light-headedness, numbness and headaches.    History Past Medical History:  Diagnosis Date  . Anxiety   . Back pain   . Constipation   . Depression   . Diabetes mellitus without complication ([place])   . Gallbladder problem   . Heart murmur   . Hyperlipidemia   . Hypertension   . Prediabetes   . Sleep apnea    On CPAP  . Spider bite   . Swelling   . Thyroid disease    hypothyroidism    She has a past surgical history that includes Cholecystectomy; Dilation and curettage of uterus; Tonsillectomy; fibroids scrapping; and Esophagogastroduodenoscopy (egd) with propofol (N/A, 09/05/2017).   Her family history includes Arthritis in her unknown relative; Breast cancer in her maternal aunt and paternal grandmother; Cancer in her maternal aunt and maternal aunt; Depression in her  mother and unknown relative; Diabetes in her maternal aunt and mother; Hyperlipidemia in her unknown relative; Lung cancer in her maternal aunt; Thyroid disease in her mother.She reports that she quit smoking about 27 years ago. Her smoking use included cigarettes. She has a 1.50 pack-year smoking history. She has never used smokeless tobacco. She reports current alcohol use. She reports that she does not use drugs.  Current Outpatient Medications on File Prior to Visit  Medication Sig Dispense Refill  . atorvastatin (LIPITOR) 40 MG tablet TAKE 1 TABLET BY MOUTH  DAILY 90 tablet 1  . Calcium Carbonate-Vitamin D (CALCIUM 500 + D PO) Take 1 tablet by mouth 2 (two) times daily.      . fenofibrate 160 MG tablet TAKE 1 TABLET BY MOUTH  DAILY 90 tablet 1  . levothyroxine (SYNTHROID, LEVOTHROID) 112 MCG tablet Take 1 tablet (112 mcg total) by mouth daily before breakfast. 30 tablet 0  . lisinopril (PRINIVIL,ZESTRIL) 5 MG tablet Take 1 tablet (5 mg total) by mouth daily. 90 tablet 0  . metFORMIN (GLUCOPHAGE) 500 MG tablet Take 1 tablet (500 mg total) by mouth daily with breakfast. 90 tablet 0  . Multiple Vitamin (MULTIVITAMIN) tablet Take 1 tablet by mouth daily.      [name] venlafaxine XR (EFFEXOR-XR) 150 MG 24 hr capsule TAKE 1 CAPSULE BY MOUTH  DAILY 90 capsule 1  . Vitamin D, Ergocalciferol, (DRISDOL) 1.25 MG (50000 UT) CAPS capsule Take 1 capsule (50,000 Units total) by mouth every 7 (seven) days. 12 capsule 0  .  vitamin E 400 UNIT capsule Take 400 Units by mouth daily.     No current facility-administered medications on file prior to visit.      Objective:  Objective  Physical Exam Vitals signs and nursing note reviewed.  Constitutional:      Appearance: She is well-developed.  HENT:     Right Ear: External ear normal. Decreased hearing noted. No tenderness. A middle ear effusion is present. There is no impacted cerumen. No foreign body. No mastoid tenderness. No PE tube. No hemotympanum. Tympanic  membrane is retracted. Tympanic membrane is not injected, scarred, perforated or erythematous.     Left Ear: External ear normal. Decreased hearing noted. No tenderness.  No middle ear effusion. There is no impacted cerumen. No foreign body. No mastoid tenderness. No PE tube. No hemotympanum. Tympanic membrane is not injected, scarred, perforated, erythematous or retracted.  Eyes:     General:        Right eye: No discharge.        Left eye: No discharge.     Conjunctiva/sclera: Conjunctivae normal.  Cardiovascular:     Rate and Rhythm: Normal rate and regular rhythm.     Heart sounds: Normal heart sounds. No murmur.  Pulmonary:     Effort: Pulmonary effort is normal. No respiratory distress.     Breath sounds: Normal breath sounds. No wheezing or rales.  Chest:     Chest wall: No tenderness.  Lymphadenopathy:     Cervical: No cervical adenopathy.  Neurological:     Mental Status: She is alert and oriented to person, place, and time.    BP 116/70 (BP Location: Right Arm, Cuff Size: Large)   Pulse 65   Temp 98.1 F (36.7 C) (Oral)   Resp 16   Ht 5\' 5"  (1.651 m)   Wt 252 lb 9.6 oz (114.6 kg)   SpO2 98%   BMI 42.03 kg/m  Wt Readings from Last 3 Encounters:  02/21/18 252 lb 9.6 oz (114.6 kg)  02/20/18 248 lb (112.5 kg)  01/29/18 255 lb (115.7 kg)     Lab Results  Component Value Date   WBC 6.2 01/01/2018   HGB 12.5 01/01/2018   HCT 38.0 01/01/2018   PLT 305 01/01/2018   GLUCOSE 111 (H) 01/01/2018   CHOL 132 01/01/2018   TRIG 100 01/01/2018   HDL 39 (L) 01/01/2018   LDLDIRECT 193.3 11/06/2012   LDLCALC 73 01/01/2018   ALT 21 01/01/2018   AST 27 01/01/2018   NA 142 01/01/2018   K 4.5 01/01/2018   CL 102 01/01/2018   CREATININE 0.89 01/01/2018   BUN 18 01/01/2018   CO2 24 01/01/2018   TSH 6.010 (H) 01/01/2018   HGBA1C 6.9 (H) 01/01/2018   MICROALBUR 1.4 09/12/2015    Mm 3d Screen Breast Bilateral  Result Date: 10/18/2017 CLINICAL DATA:  Screening. EXAM:  DIGITAL SCREENING BILATERAL MAMMOGRAM WITH TOMO AND CAD COMPARISON:  Previous exam(s). ACR Breast Density Category a: The breast tissue is almost entirely fatty. FINDINGS: There are no findings suspicious for malignancy. Images were processed with CAD. IMPRESSION: No mammographic evidence of malignancy. A result letter of this screening mammogram will be mailed directly to the patient. RECOMMENDATION: Screening mammogram in one year. (Code:SM-B-01Y) BI-RADS CATEGORY  1: Negative. Electronically Signed   By: Lajean Manes M.D.   On: 10/18/2017 10:33     Assessment & Plan:  Plan  I am having [name] start on fluticasone and loratadine. I am also having  her maintain her Calcium Carbonate-Vitamin D (CALCIUM 500 + D PO), multivitamin, vitamin E, atorvastatin, venlafaxine XR, fenofibrate, lisinopril, metFORMIN, levothyroxine, and Vitamin D (Ergocalciferol).  Meds ordered this encounter  Medications  . fluticasone (FLONASE) 50 MCG/ACT nasal spray    Sig: Place 2 sprays into both nostrils daily.    Dispense:  16 g    Refill:  6  . loratadine (CLARITIN) 10 MG tablet    Sig: Take 1 tablet (10 mg total) by mouth daily.    Dispense:  30 tablet    Refill:  11    Problem List Items Addressed This Visit    None    Visit Diagnoses    Right chronic serous otitis media    -  Primary   Relevant Medications   fluticasone (FLONASE) 50 MCG/ACT nasal spray   loratadine (CLARITIN) 10 MG tablet    consider ent if no better  Follow-up: Return if symptoms worsen or fail to improve.  [name], DO

## 2018-02-23 ENCOUNTER — Other Ambulatory Visit: Payer: Self-pay | Admitting: Family Medicine

## 2018-03-06 ENCOUNTER — Ambulatory Visit (INDEPENDENT_AMBULATORY_CARE_PROVIDER_SITE_OTHER): Payer: Medicare Other | Admitting: Family Medicine

## 2018-03-06 ENCOUNTER — Encounter (INDEPENDENT_AMBULATORY_CARE_PROVIDER_SITE_OTHER): Payer: Self-pay | Admitting: Family Medicine

## 2018-03-06 VITALS — BP 126/83 | HR 70 | Temp 98.2°F | Ht 65.0 in | Wt 249.0 lb

## 2018-03-06 DIAGNOSIS — Z6841 Body Mass Index (BMI) 40.0 and over, adult: Secondary | ICD-10-CM | POA: Diagnosis not present

## 2018-03-06 DIAGNOSIS — E559 Vitamin D deficiency, unspecified: Secondary | ICD-10-CM | POA: Diagnosis not present

## 2018-03-06 DIAGNOSIS — E1165 Type 2 diabetes mellitus with hyperglycemia: Secondary | ICD-10-CM | POA: Diagnosis not present

## 2018-03-07 NOTE — Progress Notes (Signed)
Office: [phone]  /  Fax: [phone]   HPI:   Chief Complaint: OBESITY Wenda is here to discuss her progress with her obesity treatment plan. She is on the Category 2 plan + 50 calories and is following her eating plan approximately 90 % of the time. She states she is on the treadmill and bike riding for 30 minutes 3 times per week. Jagger has been in [place] for the past few days secondary to visiting friend in the hospital. So she didn't stay on the plan as strictly. She denies hunger on the plan.  Her weight is 249 lb (112.9 kg) today and has gained 1 pound since her last visit. She has lost 15 lbs since starting treatment with [name].  Vitamin D Deficiency Cynthis has a diagnosis of vitamin D deficiency. She is currently taking prescription Vit D. she notes fatigue and denies nausea, vomiting or muscle weakness.  Diabetes II with Hyperglycemia Alizay has a diagnosis of diabetes type II. Jesus denies GI side effects of metformin even with eating off plan. She denies hunger or carbohydrate cravings. Last A1c was 6.9. she denies hypoglycemia. She has been working on intensive lifestyle modifications including diet, exercise, and weight loss to help control her blood glucose levels.  ASSESSMENT AND PLAN:  Vitamin D deficiency  Type 2 diabetes mellitus with hyperglycemia, without long-term current use of insulin (HCC)  Class 3 severe obesity with serious comorbidity and body mass index (BMI) of 40.0 to 44.9 in adult, unspecified obesity type ([place])  PLAN:  Vitamin D Deficiency Sherryl was informed that low vitamin D levels contributes to fatigue and are associated with obesity, breast, and colon cancer. Disney agrees to continue taking prescription Vit D @50 ,000 IU every week, no refill needed. She will follow up for routine testing of vitamin D, at least 2-3 times per year. She was informed of the risk of over-replacement of vitamin D and agrees to not increase her dose unless she discusses this  with [name] first. Jaylan agrees to follow up with our clinic in 2 weeks.  Diabetes II with Hyperglycemia Rawan has been given extensive diabetes education by myself today including ideal fasting and post-prandial blood glucose readings, individual ideal Hgb A1c goals and hypoglycemia prevention. We discussed the importance of good blood sugar control to decrease the likelihood of diabetic complications such as nephropathy, neuropathy, limb loss, blindness, coronary artery disease, and death. We discussed the importance of intensive lifestyle modification including diet, exercise and weight loss as the first line treatment for diabetes. Saralee agrees to continue taking metformin, ACE I, and statin. Aisley agrees to follow up with our clinic in 2 weeks.  I spent > than 50% of the 15 minute visit on counseling as documented in the note.  Obesity Anab is currently in the action stage of change. As such, her goal is to continue with weight loss efforts She has agreed to follow the Category 2 plan + 50 calories Dyamon has been instructed to work up to a goal of 150 minutes of combined cardio and strengthening exercise per week for weight loss and overall health benefits. We discussed the following Behavioral Modification Strategies today: increasing lean protein intake, increasing vegetables, work on meal planning and easy cooking plans, better snacking choices, and planning for success   Neliah has agreed to follow up with our clinic in 2 weeks. She was informed of the importance of frequent follow up visits to maximize her success with intensive lifestyle modifications for her multiple  health conditions.  ALLERGIES: No Known Allergies  MEDICATIONS: Current Outpatient Medications on File Prior to Visit  Medication Sig Dispense Refill  . atorvastatin (LIPITOR) 40 MG tablet TAKE 1 TABLET BY MOUTH  DAILY 90 tablet 1  . Calcium Carbonate-Vitamin D (CALCIUM 500 + D PO) Take 1 tablet by mouth 2 (two) times daily.       . fenofibrate 160 MG tablet TAKE 1 TABLET BY MOUTH  DAILY 90 tablet 1  . fluticasone (FLONASE) 50 MCG/ACT nasal spray Place 2 sprays into both nostrils daily. 16 g 6  . levothyroxine (SYNTHROID, LEVOTHROID) 112 MCG tablet Take 1 tablet (112 mcg total) by mouth daily before breakfast. 30 tablet 0  . lisinopril (PRINIVIL,ZESTRIL) 5 MG tablet Take 1 tablet (5 mg total) by mouth daily. 90 tablet 0  . loratadine (CLARITIN) 10 MG tablet Take 1 tablet (10 mg total) by mouth daily. 30 tablet 11  . metFORMIN (GLUCOPHAGE) 500 MG tablet Take 1 tablet (500 mg total) by mouth daily with breakfast. 90 tablet 0  . Multiple Vitamin (MULTIVITAMIN) tablet Take 1 tablet by mouth daily.      [name] venlafaxine XR (EFFEXOR-XR) 150 MG 24 hr capsule TAKE 1 CAPSULE BY MOUTH  DAILY 90 capsule 1  . Vitamin D, Ergocalciferol, (DRISDOL) 1.25 MG (50000 UT) CAPS capsule Take 1 capsule (50,000 Units total) by mouth every 7 (seven) days. 12 capsule 0  . vitamin E 400 UNIT capsule Take 400 Units by mouth daily.     No current facility-administered medications on file prior to visit.     PAST MEDICAL HISTORY: Past Medical History:  Diagnosis Date  . Anxiety   . Back pain   . Constipation   . Depression   . Diabetes mellitus without complication ([place])   . Gallbladder problem   . Heart murmur   . Hyperlipidemia   . Hypertension   . Prediabetes   . Sleep apnea    On CPAP  . Spider bite   . Swelling   . Thyroid disease    hypothyroidism    PAST SURGICAL HISTORY: Past Surgical History:  Procedure Laterality Date  . CHOLECYSTECTOMY    . DILATION AND CURETTAGE OF UTERUS    . ESOPHAGOGASTRODUODENOSCOPY (EGD) WITH PROPOFOL N/A 09/05/2017   Procedure: ESOPHAGOGASTRODUODENOSCOPY (EGD) WITH PROPOFOL;  Surgeon: Jerene Bears, MD;  Location: [facility];  Service: Gastroenterology;  Laterality: N/A;  . fibroids scrapping    . TONSILLECTOMY      SOCIAL HISTORY: Social History   Tobacco Use  . Smoking status: Former  Smoker    Packs/day: 0.50    Years: 3.00    Pack years: 1.50    Types: Cigarettes    Last attempt to quit: 08/10/1990    Years since quitting: 27.5  . Smokeless tobacco: Never Used  Substance Use Topics  . Alcohol use: Yes    Comment: rarely  . Drug use: No    FAMILY HISTORY: Family History  Problem Relation Age of Onset  . Diabetes Mother   . Thyroid disease Mother   . Depression Mother   . Depression Unknown   . Hyperlipidemia Unknown   . Arthritis Unknown   . Diabetes Maternal Aunt   . Breast cancer Paternal Grandmother   . Lung cancer Maternal Aunt        Nonsmoker  . Cancer Maternal Aunt        breast  . Breast cancer Maternal Aunt   . Cancer Maternal Aunt  lung(nonsmoker)  . Colon cancer Neg Hx   . Pancreatic cancer Neg Hx   . Stomach cancer Neg Hx   . Esophageal cancer Neg Hx   . Rectal cancer Neg Hx     ROS: Review of Systems  Constitutional: Positive for malaise/fatigue. Negative for weight loss.  Gastrointestinal: Negative for nausea and vomiting.  Musculoskeletal:       Negative muscle weakness  Endo/Heme/Allergies:       Negative hypoglycemia    PHYSICAL EXAM: Blood pressure 126/83, pulse 70, temperature 98.2 F (36.8 C), temperature source Oral, height 5\' 5"  (1.651 m), weight 249 lb (112.9 kg), SpO2 99 %. Body mass index is 41.44 kg/m. Physical Exam Vitals signs reviewed.  Constitutional:      Appearance: Normal appearance. She is obese.  Cardiovascular:     Rate and Rhythm: Normal rate.     Pulses: Normal pulses.  Pulmonary:     Effort: Pulmonary effort is normal.     Breath sounds: Normal breath sounds.  Musculoskeletal: Normal range of motion.  Skin:    General: Skin is warm and dry.  Neurological:     Mental Status: She is alert and oriented to person, place, and time.  Psychiatric:        Mood and Affect: Mood normal.        Behavior: Behavior normal.     RECENT LABS AND TESTS: BMET    Component Value Date/Time   NA  142 01/01/2018 1136   K 4.5 01/01/2018 1136   CL 102 01/01/2018 1136   CO2 24 01/01/2018 1136   GLUCOSE 111 (H) 01/01/2018 1136   GLUCOSE 150 (H) 10/17/2017 1416   BUN 18 01/01/2018 1136   CREATININE 0.89 01/01/2018 1136   CALCIUM 9.6 01/01/2018 1136   GFRNONAA 67 01/01/2018 1136   GFRAA 77 01/01/2018 1136   Lab Results  Component Value Date   HGBA1C 6.9 (H) 01/01/2018   HGBA1C 7.4 (H) 10/17/2017   HGBA1C 6.6 (H) 05/24/2017   HGBA1C 6.7 (H) 06/14/2016   HGBA1C 6.2 01/07/2014   Lab Results  Component Value Date   INSULIN 14.9 01/01/2018   CBC    Component Value Date/Time   WBC 6.2 01/01/2018 1136   WBC 6.1 10/17/2017 1416   RBC 4.21 01/01/2018 1136   RBC 4.24 10/17/2017 1416   HGB 12.5 01/01/2018 1136   HCT 38.0 01/01/2018 1136   PLT 305 01/01/2018 1136   MCV 90 01/01/2018 1136   MCH 29.7 01/01/2018 1136   MCH 30.6 12/26/2013 1540   MCHC 32.9 01/01/2018 1136   MCHC 33.4 10/17/2017 1416   RDW 12.5 01/01/2018 1136   LYMPHSABS 2.3 01/01/2018 1136   MONOABS 0.5 10/17/2017 1416   EOSABS 0.4 01/01/2018 1136   BASOSABS 0.1 01/01/2018 1136   Iron/TIBC/Ferritin/ %Sat No results found for: IRON, TIBC, FERRITIN, IRONPCTSAT Lipid Panel     Component Value Date/Time   CHOL 132 01/01/2018 1136   TRIG 100 01/01/2018 1136   HDL 39 (L) 01/01/2018 1136   CHOLHDL 3.4 01/01/2018 1136   CHOLHDL 3 10/17/2017 1416   VLDL 33.0 10/17/2017 1416   LDLCALC 73 01/01/2018 1136   LDLDIRECT 193.3 11/06/2012 0926   Hepatic Function Panel     Component Value Date/Time   PROT 6.8 01/01/2018 1136   ALBUMIN 4.4 01/01/2018 1136   AST 27 01/01/2018 1136   ALT 21 01/01/2018 1136   ALKPHOS 54 01/01/2018 1136   BILITOT 0.3 01/01/2018 1136   BILIDIR 0.1 08/09/2014  1628      Component Value Date/Time   TSH 6.010 (H) 01/01/2018 1136   TSH 2.21 10/17/2017 1416   TSH 1.50 10/08/2016 1128      OBESITY BEHAVIORAL INTERVENTION VISIT  Today's visit was # 5   Starting weight: 264  lbs Starting date: 01/01/18 Today's weight : 249 lbs  Today's date: 03/06/2018 Total lbs lost to date: 15    ASK: We discussed the diagnosis of obesity with [name] today and Grayson agreed to give [name] permission to discuss obesity behavioral modification therapy today.  ASSESS: Karyna has the diagnosis of obesity and her BMI today is 41.44 Calani is in the action stage of change   ADVISE: Toula was educated on the multiple health risks of obesity as well as the benefit of weight loss to improve her health. She was advised of the need for long term treatment and the importance of lifestyle modifications to improve her current health and to decrease her risk of future health problems.  AGREE: Multiple dietary modification options and treatment options were discussed and  Amaal agreed to follow the recommendations documented in the above note.  ARRANGE: Marissia was educated on the importance of frequent visits to treat obesity as outlined per CMS and USPSTF guidelines and agreed to schedule her next follow up appointment today.  I, Trixie Dredge, am acting as transcriptionist for Ilene Qua, MD  I have reviewed the above documentation for accuracy and completeness, and I agree with the above. - Ilene Qua, MD

## 2018-03-24 ENCOUNTER — Ambulatory Visit (INDEPENDENT_AMBULATORY_CARE_PROVIDER_SITE_OTHER): Payer: Medicare Other | Admitting: Family Medicine

## 2018-03-24 ENCOUNTER — Encounter (INDEPENDENT_AMBULATORY_CARE_PROVIDER_SITE_OTHER): Payer: Self-pay | Admitting: Family Medicine

## 2018-03-24 VITALS — BP 123/69 | HR 74 | Temp 97.8°F | Ht 65.0 in | Wt 246.0 lb

## 2018-03-24 DIAGNOSIS — E1165 Type 2 diabetes mellitus with hyperglycemia: Secondary | ICD-10-CM | POA: Diagnosis not present

## 2018-03-24 DIAGNOSIS — E038 Other specified hypothyroidism: Secondary | ICD-10-CM

## 2018-03-24 DIAGNOSIS — Z6841 Body Mass Index (BMI) 40.0 and over, adult: Secondary | ICD-10-CM | POA: Diagnosis not present

## 2018-03-24 DIAGNOSIS — E559 Vitamin D deficiency, unspecified: Secondary | ICD-10-CM

## 2018-03-24 MED ORDER — LEVOTHYROXINE SODIUM 112 MCG PO TABS: 112.0000 ug | ORAL_TABLET | Freq: Every day | ORAL | 0 refills | Status: DC

## 2018-03-25 NOTE — Progress Notes (Signed)
Office: [phone]  /  Fax: [phone]   HPI:   Chief Complaint: OBESITY Amy Mahoney is here to discuss her progress with her obesity treatment plan. She is on the Category 2 plan + 50 calories and is following her eating plan approximately 95 % of the time. She states she is cardio for 30 minutes 3 times per week. Amy Mahoney is still planning and prepping ahead of time. She denies hunger and is getting between 6 to 8 oz of meat at dinner depending on the meat.  Her weight is 246 lb (111.6 kg) today and has had a weight loss of 3 pounds over a period of 2 to 3 weeks since her last visit. She has lost 18 lbs since starting treatment with [name].  Vitamin D Deficiency Amy Mahoney has a diagnosis of vitamin D deficiency. She is currently taking prescription Vit D. She notes fatigue and denies nausea, vomiting or muscle weakness.  Diabetes II with Hyperglycemia Amy Mahoney has a diagnosis of diabetes type II. Amy Mahoney denies carbohydrate cravings or GI side effects of metformin. Last A1c was 6.9 on 01/01/18. She has been working on intensive lifestyle modifications including diet, exercise, and weight loss to help control her blood glucose levels.  Hypothyroidism Amy Mahoney has a diagnosis of hypothyroidism. She is on levothyroxine. We will need to test TSH at the end of February. She denies hot or cold intolerance or palpitations, but does admit to ongoing fatigue.  ASSESSMENT AND PLAN:  Vitamin D deficiency  Type 2 diabetes mellitus with hyperglycemia, without long-term current use of insulin (HCC)  Other specified hypothyroidism - Plan: levothyroxine (SYNTHROID, LEVOTHROID) 112 MCG tablet  Class 3 severe obesity with serious comorbidity and body mass index (BMI) of 40.0 to 44.9 in adult, unspecified obesity type ([place])  PLAN:  Vitamin D Deficiency Amy Mahoney was informed that low vitamin D levels contributes to fatigue and are associated with obesity, breast, and colon cancer. Siniya agrees to continue taking prescription Vit  D @50 ,000 IU every week and will follow up for routine testing of vitamin D, at least 2-3 times per year. She was informed of the risk of over-replacement of vitamin D and agrees to not increase her dose unless she discusses this with [name] first. Amy Mahoney agrees to follow up with our clinic in 2 weeks with our dietitian and in 4 weeks with myself.  Diabetes II with Hyperglycemia Amy Mahoney has been given extensive diabetes education by myself today including ideal fasting and post-prandial blood glucose readings, individual ideal Hgb A1c goals and hypoglycemia prevention. We discussed the importance of good blood sugar control to decrease the likelihood of diabetic complications such as nephropathy, neuropathy, limb loss, blindness, coronary artery disease, and death. We discussed the importance of intensive lifestyle modification including diet, exercise and weight loss as the first line treatment for diabetes. Amy Mahoney agrees to continue taking metformin, and she agrees to follow up with our clinic in 2 weeks with our dietitian and in 4 weeks with myself.  Hypothyroidism Amy Mahoney was informed of the importance of good thyroid control to help with weight loss efforts. She was also informed that supertheraputic thyroid levels are dangerous and will not improve weight loss results. Amy Mahoney agrees to continue taking levothyroxine 112 mcg PO daily #30 and we will refill for 1 month. Amy Mahoney agrees to follow up with our clinic in 2 weeks with our dietitian and in 4 weeks with myself.  Obesity Amy Mahoney is currently in the action stage of change. As such, her goal is to  continue with weight loss efforts She has agreed to follow the Category 2 plan + 50 calories  Amy Mahoney has been instructed to work up to a goal of 150 minutes of combined cardio and strengthening exercise per week for weight loss and overall health benefits. We discussed the following Behavioral Modification Strategies today: increasing lean protein intake, increasing  vegetables, work on meal planning and easy cooking plans, and planning for success   Amy Mahoney has agreed to follow up with our clinic in 2 weeks with our dietitian and in 4 weeks with myself. She was informed of the importance of frequent follow up visits to maximize her success with intensive lifestyle modifications for her multiple health conditions.  ALLERGIES: No Known Allergies  MEDICATIONS: Current Outpatient Medications on File Prior to Visit  Medication Sig Dispense Refill  . atorvastatin (LIPITOR) 40 MG tablet TAKE 1 TABLET BY MOUTH  DAILY 90 tablet 1  . Calcium Carbonate-Vitamin D (CALCIUM 500 + D PO) Take 1 tablet by mouth 2 (two) times daily.      . fenofibrate 160 MG tablet TAKE 1 TABLET BY MOUTH  DAILY 90 tablet 1  . fluticasone (FLONASE) 50 MCG/ACT nasal spray Place 2 sprays into both nostrils daily. 16 g 6  . lisinopril (PRINIVIL,ZESTRIL) 5 MG tablet Take 1 tablet (5 mg total) by mouth daily. 90 tablet 0  . loratadine (CLARITIN) 10 MG tablet Take 1 tablet (10 mg total) by mouth daily. 30 tablet 11  . metFORMIN (GLUCOPHAGE) 500 MG tablet Take 1 tablet (500 mg total) by mouth daily with breakfast. 90 tablet 0  . Multiple Vitamin (MULTIVITAMIN) tablet Take 1 tablet by mouth daily.      [name] venlafaxine XR (EFFEXOR-XR) 150 MG 24 hr capsule TAKE 1 CAPSULE BY MOUTH  DAILY 90 capsule 1  . Vitamin D, Ergocalciferol, (DRISDOL) 1.25 MG (50000 UT) CAPS capsule Take 1 capsule (50,000 Units total) by mouth every 7 (seven) days. 12 capsule 0  . vitamin E 400 UNIT capsule Take 400 Units by mouth daily.     No current facility-administered medications on file prior to visit.     PAST MEDICAL HISTORY: Past Medical History:  Diagnosis Date  . Anxiety   . Back pain   . Constipation   . Depression   . Diabetes mellitus without complication ([place])   . Gallbladder problem   . Heart murmur   . Hyperlipidemia   . Hypertension   . Prediabetes   . Sleep apnea    On CPAP  . Spider bite   .  Swelling   . Thyroid disease    hypothyroidism    PAST SURGICAL HISTORY: Past Surgical History:  Procedure Laterality Date  . CHOLECYSTECTOMY    . DILATION AND CURETTAGE OF UTERUS    . ESOPHAGOGASTRODUODENOSCOPY (EGD) WITH PROPOFOL N/A 09/05/2017   Procedure: ESOPHAGOGASTRODUODENOSCOPY (EGD) WITH PROPOFOL;  Surgeon: Jerene Bears, MD;  Location: [facility];  Service: Gastroenterology;  Laterality: N/A;  . fibroids scrapping    . TONSILLECTOMY      SOCIAL HISTORY: Social History   Tobacco Use  . Smoking status: Former Smoker    Packs/day: 0.50    Years: 3.00    Pack years: 1.50    Types: Cigarettes    Last attempt to quit: 08/10/1990    Years since quitting: 27.6  . Smokeless tobacco: Never Used  Substance Use Topics  . Alcohol use: Yes    Comment: rarely  . Drug use: No    FAMILY  HISTORY: Family History  Problem Relation Age of Onset  . Diabetes Mother   . Thyroid disease Mother   . Depression Mother   . Depression Unknown   . Hyperlipidemia Unknown   . Arthritis Unknown   . Diabetes Maternal Aunt   . Breast cancer Paternal Grandmother   . Lung cancer Maternal Aunt        Nonsmoker  . Cancer Maternal Aunt        breast  . Breast cancer Maternal Aunt   . Cancer Maternal Aunt        lung(nonsmoker)  . Colon cancer Neg Hx   . Pancreatic cancer Neg Hx   . Stomach cancer Neg Hx   . Esophageal cancer Neg Hx   . Rectal cancer Neg Hx     ROS: Review of Systems  Constitutional: Positive for malaise/fatigue and weight loss.  Cardiovascular: Negative for palpitations.  Gastrointestinal: Negative for nausea and vomiting.  Musculoskeletal:       Negative muscle weakness  Endo/Heme/Allergies:       Negative hypoglycemia Negative hot/cold intolerance    PHYSICAL EXAM: Blood pressure 123/69, pulse 74, temperature 97.8 F (36.6 C), temperature source Oral, height 5\' 5"  (1.651 m), weight 246 lb (111.6 kg), SpO2 96 %. Body mass index is 40.94 kg/m. Physical  Exam Vitals signs reviewed.  Constitutional:      Appearance: Normal appearance. She is obese.  Cardiovascular:     Rate and Rhythm: Normal rate.     Pulses: Normal pulses.  Pulmonary:     Effort: Pulmonary effort is normal.     Breath sounds: Normal breath sounds.  Musculoskeletal: Normal range of motion.  Skin:    General: Skin is warm and dry.  Neurological:     Mental Status: She is alert and oriented to person, place, and time.  Psychiatric:        Mood and Affect: Mood normal.        Behavior: Behavior normal.     RECENT LABS AND TESTS: BMET    Component Value Date/Time   NA 142 01/01/2018 1136   K 4.5 01/01/2018 1136   CL 102 01/01/2018 1136   CO2 24 01/01/2018 1136   GLUCOSE 111 (H) 01/01/2018 1136   GLUCOSE 150 (H) 10/17/2017 1416   BUN 18 01/01/2018 1136   CREATININE 0.89 01/01/2018 1136   CALCIUM 9.6 01/01/2018 1136   GFRNONAA 67 01/01/2018 1136   GFRAA 77 01/01/2018 1136   Lab Results  Component Value Date   HGBA1C 6.9 (H) 01/01/2018   HGBA1C 7.4 (H) 10/17/2017   HGBA1C 6.6 (H) 05/24/2017   HGBA1C 6.7 (H) 06/14/2016   HGBA1C 6.2 01/07/2014   Lab Results  Component Value Date   INSULIN 14.9 01/01/2018   CBC    Component Value Date/Time   WBC 6.2 01/01/2018 1136   WBC 6.1 10/17/2017 1416   RBC 4.21 01/01/2018 1136   RBC 4.24 10/17/2017 1416   HGB 12.5 01/01/2018 1136   HCT 38.0 01/01/2018 1136   PLT 305 01/01/2018 1136   MCV 90 01/01/2018 1136   MCH 29.7 01/01/2018 1136   MCH 30.6 12/26/2013 1540   MCHC 32.9 01/01/2018 1136   MCHC 33.4 10/17/2017 1416   RDW 12.5 01/01/2018 1136   LYMPHSABS 2.3 01/01/2018 1136   MONOABS 0.5 10/17/2017 1416   EOSABS 0.4 01/01/2018 1136   BASOSABS 0.1 01/01/2018 1136   Iron/TIBC/Ferritin/ %Sat No results found for: IRON, TIBC, FERRITIN, IRONPCTSAT Lipid Panel  Component Value Date/Time   CHOL 132 01/01/2018 1136   TRIG 100 01/01/2018 1136   HDL 39 (L) 01/01/2018 1136   CHOLHDL 3.4 01/01/2018 1136    CHOLHDL 3 10/17/2017 1416   VLDL 33.0 10/17/2017 1416   LDLCALC 73 01/01/2018 1136   LDLDIRECT 193.3 11/06/2012 0926   Hepatic Function Panel     Component Value Date/Time   PROT 6.8 01/01/2018 1136   ALBUMIN 4.4 01/01/2018 1136   AST 27 01/01/2018 1136   ALT 21 01/01/2018 1136   ALKPHOS 54 01/01/2018 1136   BILITOT 0.3 01/01/2018 1136   BILIDIR 0.1 08/09/2014 1628      Component Value Date/Time   TSH 6.010 (H) 01/01/2018 1136   TSH 2.21 10/17/2017 1416   TSH 1.50 10/08/2016 1128      OBESITY BEHAVIORAL INTERVENTION VISIT  Today's visit was # 6   Starting weight: 264 lbs Starting date: 01/01/18 Today's weight : 246 lbs Today's date: 03/24/2018 Total lbs lost to date: 18 At least 15 minutes were spent on discussing the following behavioral intervention visit.   ASK: We discussed the diagnosis of obesity with [name] today and Keriana agreed to give [name] permission to discuss obesity behavioral modification therapy today.  ASSESS: Shaneisha has the diagnosis of obesity and her BMI today is 40.94 Andre is in the action stage of change   ADVISE: Lisseth was educated on the multiple health risks of obesity as well as the benefit of weight loss to improve her health. She was advised of the need for long term treatment and the importance of lifestyle modifications to improve her current health and to decrease her risk of future health problems.  AGREE: Multiple dietary modification options and treatment options were discussed and  Johnesha agreed to follow the recommendations documented in the above note.  ARRANGE: Ashaki was educated on the importance of frequent visits to treat obesity as outlined per CMS and USPSTF guidelines and agreed to schedule her next follow up appointment today.  I, Trixie Dredge, am acting as transcriptionist for Ilene Qua, MD  I have reviewed the above documentation for accuracy and completeness, and I agree with the above. - Ilene Qua, MD

## 2018-03-31 ENCOUNTER — Encounter: Payer: Self-pay | Admitting: Family Medicine

## 2018-03-31 DIAGNOSIS — G8929 Other chronic pain: Secondary | ICD-10-CM

## 2018-03-31 DIAGNOSIS — H9201 Otalgia, right ear: Principal | ICD-10-CM

## 2018-04-14 ENCOUNTER — Ambulatory Visit (INDEPENDENT_AMBULATORY_CARE_PROVIDER_SITE_OTHER): Payer: Medicare Other | Admitting: Family Medicine

## 2018-04-14 ENCOUNTER — Encounter (INDEPENDENT_AMBULATORY_CARE_PROVIDER_SITE_OTHER): Payer: Self-pay | Admitting: Family Medicine

## 2018-04-14 VITALS — BP 116/79 | HR 70 | Temp 97.7°F | Ht 65.0 in | Wt 247.0 lb

## 2018-04-14 DIAGNOSIS — E038 Other specified hypothyroidism: Secondary | ICD-10-CM | POA: Diagnosis not present

## 2018-04-14 DIAGNOSIS — Z87891 Personal history of nicotine dependence: Secondary | ICD-10-CM | POA: Diagnosis not present

## 2018-04-14 DIAGNOSIS — E1165 Type 2 diabetes mellitus with hyperglycemia: Secondary | ICD-10-CM | POA: Diagnosis not present

## 2018-04-14 DIAGNOSIS — H9201 Otalgia, right ear: Secondary | ICD-10-CM | POA: Diagnosis not present

## 2018-04-14 DIAGNOSIS — Z6841 Body Mass Index (BMI) 40.0 and over, adult: Secondary | ICD-10-CM

## 2018-04-14 DIAGNOSIS — Z974 Presence of external hearing-aid: Secondary | ICD-10-CM | POA: Diagnosis not present

## 2018-04-14 DIAGNOSIS — G8929 Other chronic pain: Secondary | ICD-10-CM | POA: Diagnosis not present

## 2018-04-14 DIAGNOSIS — E559 Vitamin D deficiency, unspecified: Secondary | ICD-10-CM | POA: Diagnosis not present

## 2018-04-14 DIAGNOSIS — M542 Cervicalgia: Secondary | ICD-10-CM | POA: Diagnosis not present

## 2018-04-15 LAB — COMPREHENSIVE METABOLIC PANEL
ALT: 23 IU/L (ref 0–32)
AST: 29 IU/L (ref 0–40)
Albumin/Globulin Ratio: 2.1 (ref 1.2–2.2)
Albumin: 4.4 g/dL (ref 3.8–4.8)
Alkaline Phosphatase: 45 IU/L (ref 39–117)
BUN/Creatinine Ratio: 25 (ref 12–28)
BUN: 23 mg/dL (ref 8–27)
Bilirubin Total: 0.4 mg/dL (ref 0.0–1.2)
CALCIUM: 9.8 mg/dL (ref 8.7–10.3)
CO2: 23 mmol/L (ref 20–29)
Chloride: 106 mmol/L (ref 96–106)
Creatinine, Ser: 0.91 mg/dL (ref 0.57–1.00)
GFR calc Af Amer: 75 mL/min/{1.73_m2} (ref >59)
GFR calc non Af Amer: 65 mL/min/{1.73_m2} (ref >59)
Globulin, Total: 2.1 g/dL (ref 1.5–4.5)
Glucose: 126 mg/dL — ABNORMAL HIGH (ref 65–99)
Potassium: 5.1 mmol/L (ref 3.5–5.2)
Sodium: 144 mmol/L (ref 134–144)
Total Protein: 6.5 g/dL (ref 6.0–8.5)

## 2018-04-15 LAB — HEMOGLOBIN A1C
Est. average glucose Bld gHb Est-mCnc: 126 mg/dL
Hgb A1c MFr Bld: 6 % — ABNORMAL HIGH (ref 4.8–5.6)

## 2018-04-15 LAB — LIPID PANEL WITH LDL/HDL RATIO
Cholesterol, Total: 115 mg/dL (ref 100–199)
HDL: 40 mg/dL (ref >39)
LDL Calculated: 60 mg/dL (ref 0–99)
LDl/HDL Ratio: 1.5 ratio (ref 0.0–3.2)
TRIGLYCERIDES: 75 mg/dL (ref 0–149)
VLDL CHOLESTEROL CAL: 15 mg/dL (ref 5–40)

## 2018-04-15 LAB — VITAMIN D 25 HYDROXY (VIT D DEFICIENCY, FRACTURES): Vit D, 25-Hydroxy: 54.6 ng/mL (ref 30.0–100.0)

## 2018-04-15 LAB — INSULIN, RANDOM: INSULIN: 17.1 u[IU]/mL (ref 2.6–24.9)

## 2018-04-15 LAB — TSH: TSH: 2.04 u[IU]/mL (ref 0.450–4.500)

## 2018-04-15 LAB — VITAMIN B12: VITAMIN B 12: 796 pg/mL (ref 232–1245)

## 2018-04-15 NOTE — Progress Notes (Signed)
Office: [phone]  /  Fax: [phone]   HPI:   Chief Complaint: OBESITY Amy Mahoney is here to discuss her progress with her obesity treatment plan. She is on the Category 2 plan + 50 calories and is following her eating plan approximately 90 % of the time. She states she is doing resistance training for 40 minutes 3 times per week. Amy Mahoney voices she feels she overdid it on snacks with overindulging on popcorn. She started exercising and struggled to get into a program she enjoyed. She notes minimal additional hunger.  Her weight is 247 lb (112 kg) today and has gained 1 pound since her last visit. She has lost 17 lbs since starting treatment with [name].  Diabetes II with Hyperglycemia Amy Mahoney has a diagnosis of diabetes type II. Amy Mahoney denies GI side effects of metformin. She is not checking BGs and denies hypoglycemia. Last A1c was 6.9. She has been working on intensive lifestyle modifications including diet, exercise, and weight loss to help control her blood glucose levels.  Hypothyroidism Amy Mahoney has a diagnosis of hypothyroidism. She is on levothyroxine. She denies hot or cold intolerance or palpitations, but does admit to ongoing fatigue.  Vitamin D Deficiency Amy Mahoney has a diagnosis of vitamin D deficiency. She is currently taking prescription Vit D. she notes fatigue and denies nausea, vomiting or muscle weakness.  ASSESSMENT AND PLAN:  Type 2 diabetes mellitus with hyperglycemia, without long-term current use of insulin ([name]) - Plan: Vitamin B12, Hemoglobin A1c, Insulin, random, Comprehensive metabolic panel, Lipid Panel With LDL/HDL Ratio  Other specified hypothyroidism - Plan: TSH  Vitamin D deficiency - Plan: VITAMIN D 25 Hydroxy (Vit-D Deficiency, Fractures)  Class 3 severe obesity with serious comorbidity and body mass index (BMI) of 40.0 to 44.9 in adult, unspecified obesity type ([name])  PLAN:  Diabetes II with Hyperglycemia Amy Mahoney has been given extensive diabetes education by myself  today including ideal fasting and post-prandial blood glucose readings, individual ideal Hgb A1c goals and hypoglycemia prevention. We discussed the importance of good blood sugar control to decrease the likelihood of diabetic complications such as nephropathy, neuropathy, limb loss, blindness, coronary artery disease, and death. We discussed the importance of intensive lifestyle modification including diet, exercise and weight loss as the first line treatment for diabetes. Amy Mahoney agrees to continue her diabetes medications and we will check Hgb A1c, insulin, FLP, CMP, and Vit B12 today. Amy Mahoney agrees to follow up with our clinic in 2 weeks.  Hypothyroidism Amy Mahoney was informed of the importance of good thyroid control to help with weight loss efforts. She was also informed that supertheraputic thyroid levels are dangerous and will not improve weight loss results. We will check TSH today. Amy Mahoney agrees to follow up with our clinic in 2 weeks.  Vitamin D Deficiency Amy Mahoney was informed that low vitamin D levels contributes to fatigue and are associated with obesity, breast, and colon cancer. Amy Mahoney agrees to continue taking prescription Vit D @50 ,000 IU every week and will follow up for routine testing of vitamin D, at least 2-3 times per year. She was informed of the risk of over-replacement of vitamin D and agrees to not increase her dose unless she discusses this with [name] first. We will check Vit D level today. Amy Mahoney agrees to follow up with our clinic in 2 weeks.  Obesity Amy Mahoney is currently in the action stage of change. As such, her goal is to continue with weight loss efforts She has agreed to follow the Category 2 plan + 50  calories Amy Mahoney has been instructed to work up to a goal of 150 minutes of combined cardio and strengthening exercise per week for weight loss and overall health benefits. We discussed the following Behavioral Modification Strategies today: increasing lean protein intake, increasing vegetables,  work on meal planning and easy cooking plans, better snacking choices, and planning for success   Amy Mahoney has agreed to follow up with our clinic in 2 weeks. She was informed of the importance of frequent follow up visits to maximize her success with intensive lifestyle modifications for her multiple health conditions.  ALLERGIES: No Known Allergies  MEDICATIONS: Current Outpatient Medications on File Prior to Visit  Medication Sig Dispense Refill  . atorvastatin (LIPITOR) 40 MG tablet TAKE 1 TABLET BY MOUTH  DAILY 90 tablet 1  . Calcium Carbonate-Vitamin D (CALCIUM 500 + D PO) Take 1 tablet by mouth 2 (two) times daily.      . fenofibrate 160 MG tablet TAKE 1 TABLET BY MOUTH  DAILY 90 tablet 1  . fluticasone (FLONASE) 50 MCG/ACT nasal spray Place 2 sprays into both nostrils daily. 16 g 6  . levothyroxine (SYNTHROID, LEVOTHROID) 112 MCG tablet Take 1 tablet (112 mcg total) by mouth daily before breakfast. 30 tablet 0  . lisinopril (PRINIVIL,ZESTRIL) 5 MG tablet Take 1 tablet (5 mg total) by mouth daily. 90 tablet 0  . loratadine (CLARITIN) 10 MG tablet Take 1 tablet (10 mg total) by mouth daily. 30 tablet 11  . metFORMIN (GLUCOPHAGE) 500 MG tablet Take 1 tablet (500 mg total) by mouth daily with breakfast. 90 tablet 0  . Multiple Vitamin (MULTIVITAMIN) tablet Take 1 tablet by mouth daily.      [name] venlafaxine XR (EFFEXOR-XR) 150 MG 24 hr capsule TAKE 1 CAPSULE BY MOUTH  DAILY 90 capsule 1  . Vitamin D, Ergocalciferol, (DRISDOL) 1.25 MG (50000 UT) CAPS capsule Take 1 capsule (50,000 Units total) by mouth every 7 (seven) days. 12 capsule 0  . vitamin E 400 UNIT capsule Take 400 Units by mouth daily.     No current facility-administered medications on file prior to visit.     PAST MEDICAL HISTORY: Past Medical History:  Diagnosis Date  . Anxiety   . Back pain   . Constipation   . Depression   . Diabetes mellitus without complication ([place])   . Gallbladder problem   . Heart murmur   .  Hyperlipidemia   . Hypertension   . Prediabetes   . Sleep apnea    On CPAP  . Spider bite   . Swelling   . Thyroid disease    hypothyroidism    PAST SURGICAL HISTORY: Past Surgical History:  Procedure Laterality Date  . CHOLECYSTECTOMY    . DILATION AND CURETTAGE OF UTERUS    . ESOPHAGOGASTRODUODENOSCOPY (EGD) WITH PROPOFOL N/A 09/05/2017   Procedure: ESOPHAGOGASTRODUODENOSCOPY (EGD) WITH PROPOFOL;  Surgeon: Jerene Bears, MD;  Location: [facility];  Service: Gastroenterology;  Laterality: N/A;  . fibroids scrapping    . TONSILLECTOMY      SOCIAL HISTORY: Social History   Tobacco Use  . Smoking status: Former Smoker    Packs/day: 0.50    Years: 3.00    Pack years: 1.50    Types: Cigarettes    Last attempt to quit: 08/10/1990    Years since quitting: 27.6  . Smokeless tobacco: Never Used  Substance Use Topics  . Alcohol use: Yes    Comment: rarely  . Drug use: No    FAMILY HISTORY:  Family History  Problem Relation Age of Onset  . Diabetes Mother   . Thyroid disease Mother   . Depression Mother   . Depression Unknown   . Hyperlipidemia Unknown   . Arthritis Unknown   . Diabetes Maternal Aunt   . Breast cancer Paternal Grandmother   . Lung cancer Maternal Aunt        Nonsmoker  . Cancer Maternal Aunt        breast  . Breast cancer Maternal Aunt   . Cancer Maternal Aunt        lung(nonsmoker)  . Colon cancer Neg Hx   . Pancreatic cancer Neg Hx   . Stomach cancer Neg Hx   . Esophageal cancer Neg Hx   . Rectal cancer Neg Hx     ROS: Review of Systems  Constitutional: Positive for malaise/fatigue. Negative for weight loss.  Cardiovascular: Negative for palpitations.  Gastrointestinal: Negative for nausea and vomiting.  Musculoskeletal:       Negative muscle weakness  Endo/Heme/Allergies:       Negative hypoglycemia Negative hot/cold intolerance    PHYSICAL EXAM: Blood pressure 116/79, pulse 70, temperature 97.7 F (36.5 C), temperature source  Oral, height 5\' 5"  (1.651 m), weight 247 lb (112 kg), SpO2 97 %. Body mass index is 41.1 kg/m. Physical Exam Vitals signs reviewed.  Constitutional:      Appearance: Normal appearance. She is obese.  Cardiovascular:     Rate and Rhythm: Normal rate.     Pulses: Normal pulses.  Pulmonary:     Effort: Pulmonary effort is normal.     Breath sounds: Normal breath sounds.  Musculoskeletal: Normal range of motion.  Skin:    General: Skin is warm and dry.  Neurological:     Mental Status: She is alert and oriented to person, place, and time.  Psychiatric:        Mood and Affect: Mood normal.        Behavior: Behavior normal.     RECENT LABS AND TESTS: BMET    Component Value Date/Time   NA 142 01/01/2018 1136   K 4.5 01/01/2018 1136   CL 102 01/01/2018 1136   CO2 24 01/01/2018 1136   GLUCOSE 111 (H) 01/01/2018 1136   GLUCOSE 150 (H) 10/17/2017 1416   BUN 18 01/01/2018 1136   CREATININE 0.89 01/01/2018 1136   CALCIUM 9.6 01/01/2018 1136   GFRNONAA 67 01/01/2018 1136   GFRAA 77 01/01/2018 1136   Lab Results  Component Value Date   HGBA1C 6.9 (H) 01/01/2018   HGBA1C 7.4 (H) 10/17/2017   HGBA1C 6.6 (H) 05/24/2017   HGBA1C 6.7 (H) 06/14/2016   HGBA1C 6.2 01/07/2014   Lab Results  Component Value Date   INSULIN 14.9 01/01/2018   CBC    Component Value Date/Time   WBC 6.2 01/01/2018 1136   WBC 6.1 10/17/2017 1416   RBC 4.21 01/01/2018 1136   RBC 4.24 10/17/2017 1416   HGB 12.5 01/01/2018 1136   HCT 38.0 01/01/2018 1136   PLT 305 01/01/2018 1136   MCV 90 01/01/2018 1136   MCH 29.7 01/01/2018 1136   MCH 30.6 12/26/2013 1540   MCHC 32.9 01/01/2018 1136   MCHC 33.4 10/17/2017 1416   RDW 12.5 01/01/2018 1136   LYMPHSABS 2.3 01/01/2018 1136   MONOABS 0.5 10/17/2017 1416   EOSABS 0.4 01/01/2018 1136   BASOSABS 0.1 01/01/2018 1136   Iron/TIBC/Ferritin/ %Sat No results found for: IRON, TIBC, FERRITIN, IRONPCTSAT Lipid Panel  Component Value Date/Time   CHOL  132 01/01/2018 1136   TRIG 100 01/01/2018 1136   HDL 39 (L) 01/01/2018 1136   CHOLHDL 3.4 01/01/2018 1136   CHOLHDL 3 10/17/2017 1416   VLDL 33.0 10/17/2017 1416   LDLCALC 73 01/01/2018 1136   LDLDIRECT 193.3 11/06/2012 0926   Hepatic Function Panel     Component Value Date/Time   PROT 6.8 01/01/2018 1136   ALBUMIN 4.4 01/01/2018 1136   AST 27 01/01/2018 1136   ALT 21 01/01/2018 1136   ALKPHOS 54 01/01/2018 1136   BILITOT 0.3 01/01/2018 1136   BILIDIR 0.1 08/09/2014 1628      Component Value Date/Time   TSH 6.010 (H) 01/01/2018 1136   TSH 2.21 10/17/2017 1416   TSH 1.50 10/08/2016 1128      OBESITY BEHAVIORAL INTERVENTION VISIT  Today's visit was # 7   Starting weight: 264 lbs Starting date: 01/01/18 Today's weight : 247 lbs  Today's date: 04/14/2018 Total lbs lost to date: 17 At least 15 minutes were spent on discussing the following behavioral intervention visit.   ASK: We discussed the diagnosis of obesity with Amy Mahoney today and Amy Mahoney agreed to give [name] permission to discuss obesity behavioral modification therapy today.  ASSESS: Amy Mahoney has the diagnosis of obesity and her BMI today is 41.1 Linsay is in the action stage of change   ADVISE: Meridian was educated on the multiple health risks of obesity as well as the benefit of weight loss to improve her health. She was advised of the need for long term treatment and the importance of lifestyle modifications to improve her current health and to decrease her risk of future health problems.  AGREE: Multiple dietary modification options and treatment options were discussed and  Aryani agreed to follow the recommendations documented in the above note.  ARRANGE: Ariane was educated on the importance of frequent visits to treat obesity as outlined per CMS and USPSTF guidelines and agreed to schedule her next follow up appointment today.  I, Amy Mahoney, am acting as transcriptionist for Amy Qua, MD  I have  reviewed the above documentation for accuracy and completeness, and I agree with the above. - Amy Qua, MD

## 2018-05-06 ENCOUNTER — Other Ambulatory Visit (INDEPENDENT_AMBULATORY_CARE_PROVIDER_SITE_OTHER): Payer: Self-pay | Admitting: Family Medicine

## 2018-05-06 ENCOUNTER — Ambulatory Visit (INDEPENDENT_AMBULATORY_CARE_PROVIDER_SITE_OTHER): Payer: Medicare Other | Admitting: Family Medicine

## 2018-05-06 DIAGNOSIS — E1165 Type 2 diabetes mellitus with hyperglycemia: Secondary | ICD-10-CM

## 2018-05-06 DIAGNOSIS — E559 Vitamin D deficiency, unspecified: Secondary | ICD-10-CM

## 2018-05-06 DIAGNOSIS — E038 Other specified hypothyroidism: Secondary | ICD-10-CM

## 2018-05-06 MED ORDER — VITAMIN D (ERGOCALCIFEROL) 1.25 MG (50000 UNIT) PO CAPS: 50000.0000 [IU] | ORAL_CAPSULE | ORAL | 0 refills | Status: DC

## 2018-05-06 MED ORDER — LEVOTHYROXINE SODIUM 112 MCG PO TABS: 112.0000 ug | ORAL_TABLET | Freq: Every day | ORAL | 0 refills | Status: DC

## 2018-05-06 MED ORDER — LISINOPRIL 5 MG PO TABS: 5.0000 mg | ORAL_TABLET | Freq: Every day | ORAL | 0 refills | Status: DC

## 2018-05-06 MED ORDER — METFORMIN HCL 500 MG PO TABS: 500.0000 mg | ORAL_TABLET | Freq: Every day | ORAL | 0 refills | Status: DC

## 2018-05-15 ENCOUNTER — Encounter (INDEPENDENT_AMBULATORY_CARE_PROVIDER_SITE_OTHER): Payer: Self-pay

## 2018-05-20 ENCOUNTER — Encounter (INDEPENDENT_AMBULATORY_CARE_PROVIDER_SITE_OTHER): Payer: Self-pay

## 2018-05-26 ENCOUNTER — Ambulatory Visit (INDEPENDENT_AMBULATORY_CARE_PROVIDER_SITE_OTHER): Payer: Medicare Other | Admitting: Family Medicine

## 2018-05-30 ENCOUNTER — Other Ambulatory Visit (INDEPENDENT_AMBULATORY_CARE_PROVIDER_SITE_OTHER): Payer: Self-pay | Admitting: Family Medicine

## 2018-05-30 DIAGNOSIS — E038 Other specified hypothyroidism: Secondary | ICD-10-CM

## 2018-06-05 ENCOUNTER — Ambulatory Visit (INDEPENDENT_AMBULATORY_CARE_PROVIDER_SITE_OTHER): Payer: Medicare Other | Admitting: Bariatrics

## 2018-06-05 ENCOUNTER — Other Ambulatory Visit: Payer: Self-pay

## 2018-06-05 ENCOUNTER — Encounter (INDEPENDENT_AMBULATORY_CARE_PROVIDER_SITE_OTHER): Payer: Self-pay | Admitting: Bariatrics

## 2018-06-05 DIAGNOSIS — E1165 Type 2 diabetes mellitus with hyperglycemia: Secondary | ICD-10-CM

## 2018-06-05 DIAGNOSIS — Z6841 Body Mass Index (BMI) 40.0 and over, adult: Secondary | ICD-10-CM

## 2018-06-05 DIAGNOSIS — E038 Other specified hypothyroidism: Secondary | ICD-10-CM | POA: Diagnosis not present

## 2018-06-05 MED ORDER — LEVOTHYROXINE SODIUM 112 MCG PO TABS: 112.0000 ug | ORAL_TABLET | Freq: Every day | ORAL | 0 refills | Status: DC

## 2018-06-09 NOTE — Progress Notes (Signed)
Office: [phone]  /  Fax: [phone] TeleHealth Visit:  Amy Mahoney has verbally consented to this TeleHealth visit today. The patient is located at home, the provider is located at the [employer] [facility] office. The participants in this visit include the listed provider and patient and any and all parties involved. The visit was conducted today via WebEx.  HPI:   Chief Complaint: OBESITY Amy Mahoney is here to discuss her progress with her obesity treatment plan. She is on the Category 2 plan +250 calories and is following her eating plan approximately 70 % of the time. She states she is doing cardio exercise and weights for 40 minutes 3 to 4 times per week. Amy Mahoney states that she has lost 8 pounds. She was last seen on 04/14/18. She has been stressed without any significant stress eating. We were unable to weigh the patient today for this TeleHealth visit. She feels as if she has lost weight since her last visit. She has lost 25 lbs since starting treatment with [name].  Hypothyroidism Amy Mahoney has a diagnosis of hypothyroidism. She is on levothyroxine. She denies any signs or symptoms.  Diabetes II with hyperglycemia Amy Mahoney has a diagnosis of diabetes type II. Amy Mahoney does not check her blood sugar and she denies any hypoglycemic episodes. Amy Mahoney is taking metformin. Her last A1c was at 6.0 She has been working on intensive lifestyle modifications including diet, exercise, and weight loss to help control her blood glucose levels.  ASSESSMENT AND PLAN:  Other specified hypothyroidism - Plan: levothyroxine (SYNTHROID) 112 MCG tablet  Type 2 diabetes mellitus with hyperglycemia, without long-term current use of insulin (HCC)  Class 3 severe obesity with serious comorbidity and body mass index (BMI) of 40.0 to 44.9 in adult, unspecified obesity type ([place])  PLAN:  Hypothyroidism Amy Mahoney was informed of the importance of good thyroid control to help with weight loss efforts. She was also informed  that supertheraputic thyroid levels are dangerous and will not improve weight loss results. Amy Mahoney agrees to continue levothyroxine 112 mcg daily #30 with no refills and follow up as directed.  Diabetes II with hyperglycemia Amy Mahoney has been given extensive diabetes education by myself today including ideal fasting and post-prandial blood glucose readings, individual ideal Hgb A1c goals and hypoglycemia prevention. We discussed the importance of good blood sugar control to decrease the likelihood of diabetic complications such as nephropathy, neuropathy, limb loss, blindness, coronary artery disease, and death. We discussed the importance of intensive lifestyle modification including diet, exercise and weight loss as the first line treatment for diabetes. Amy Mahoney agrees to continue her diabetes medications and will follow up at the agreed upon time.  Obesity Amy Mahoney is currently in the action stage of change. As such, her goal is to continue with weight loss efforts She has agreed to follow the Category 2 plan +250 calories Amy Mahoney will continue exercise every other day for weight loss and overall health benefits. We discussed the following Behavioral Modification Strategies today: increase H2O intake, no skipping meals, keeping healthy foods in the home, increasing lean protein intake, decreasing simple carbohydrates, increasing vegetables, decrease eating out and work on meal planning and easy cooking plans Amy Mahoney will weigh herself at home before each visit.  Amy Mahoney has agreed to follow up with our clinic in 2 weeks. She was informed of the importance of frequent follow up visits to maximize her success with intensive lifestyle modifications for her multiple health conditions.  ALLERGIES: No Known Allergies  MEDICATIONS: Current Outpatient Medications  on File Prior to Visit  Medication Sig Dispense Refill  . atorvastatin (LIPITOR) 40 MG tablet TAKE 1 TABLET BY MOUTH  DAILY 90 tablet 1  . Calcium  Carbonate-Vitamin D (CALCIUM 500 + D PO) Take 1 tablet by mouth 2 (two) times daily.      . fenofibrate 160 MG tablet TAKE 1 TABLET BY MOUTH  DAILY 90 tablet 1  . fluticasone (FLONASE) 50 MCG/ACT nasal spray Place 2 sprays into both nostrils daily. 16 g 6  . lisinopril (PRINIVIL,ZESTRIL) 5 MG tablet Take 1 tablet (5 mg total) by mouth daily. 90 tablet 0  . loratadine (CLARITIN) 10 MG tablet Take 1 tablet (10 mg total) by mouth daily. 30 tablet 11  . metFORMIN (GLUCOPHAGE) 500 MG tablet Take 1 tablet (500 mg total) by mouth daily with breakfast. 90 tablet 0  . Multiple Vitamin (MULTIVITAMIN) tablet Take 1 tablet by mouth daily.      [name] venlafaxine XR (EFFEXOR-XR) 150 MG 24 hr capsule TAKE 1 CAPSULE BY MOUTH  DAILY 90 capsule 1  . Vitamin D, Ergocalciferol, (DRISDOL) 1.25 MG (50000 UT) CAPS capsule Take 1 capsule (50,000 Units total) by mouth every 7 (seven) days. 12 capsule 0  . vitamin E 400 UNIT capsule Take 400 Units by mouth daily.     No current facility-administered medications on file prior to visit.     PAST MEDICAL HISTORY: Past Medical History:  Diagnosis Date  . Anxiety   . Back pain   . Constipation   . Depression   . Diabetes mellitus without complication ([place])   . Gallbladder problem   . Heart murmur   . Hyperlipidemia   . Hypertension   . Prediabetes   . Sleep apnea    On CPAP  . Spider bite   . Swelling   . Thyroid disease    hypothyroidism    PAST SURGICAL HISTORY: Past Surgical History:  Procedure Laterality Date  . CHOLECYSTECTOMY    . DILATION AND CURETTAGE OF UTERUS    . ESOPHAGOGASTRODUODENOSCOPY (EGD) WITH PROPOFOL N/A 09/05/2017   Procedure: ESOPHAGOGASTRODUODENOSCOPY (EGD) WITH PROPOFOL;  Surgeon: Jerene Bears, MD;  Location: [facility];  Service: Gastroenterology;  Laterality: N/A;  . fibroids scrapping    . TONSILLECTOMY      SOCIAL HISTORY: Social History   Tobacco Use  . Smoking status: Former Smoker    Packs/day: 0.50    Years: 3.00     Pack years: 1.50    Types: Cigarettes    Last attempt to quit: 08/10/1990    Years since quitting: 27.8  . Smokeless tobacco: Never Used  Substance Use Topics  . Alcohol use: Yes    Comment: rarely  . Drug use: No    FAMILY HISTORY: Family History  Problem Relation Age of Onset  . Diabetes Mother   . Thyroid disease Mother   . Depression Mother   . Depression Unknown   . Hyperlipidemia Unknown   . Arthritis Unknown   . Diabetes Maternal Aunt   . Breast cancer Paternal Grandmother   . Lung cancer Maternal Aunt        Nonsmoker  . Cancer Maternal Aunt        breast  . Breast cancer Maternal Aunt   . Cancer Maternal Aunt        lung(nonsmoker)  . Colon cancer Neg Hx   . Pancreatic cancer Neg Hx   . Stomach cancer Neg Hx   . Esophageal cancer Neg Hx   .  Rectal cancer Neg Hx     ROS: Review of Systems  Constitutional: Positive for weight loss. Negative for malaise/fatigue.  Cardiovascular: Negative for palpitations.  Endo/Heme/Allergies:       Negative for heat or cold intolerance Negative for hypoglycemia    PHYSICAL EXAM: Pt in no acute distress  RECENT LABS AND TESTS: BMET    Component Value Date/Time   NA 144 04/14/2018 1019   K 5.1 04/14/2018 1019   CL 106 04/14/2018 1019   CO2 23 04/14/2018 1019   GLUCOSE 126 (H) 04/14/2018 1019   GLUCOSE 150 (H) 10/17/2017 1416   BUN 23 04/14/2018 1019   CREATININE 0.91 04/14/2018 1019   CALCIUM 9.8 04/14/2018 1019   GFRNONAA 65 04/14/2018 1019   GFRAA 75 04/14/2018 1019   Lab Results  Component Value Date   HGBA1C 6.0 (H) 04/14/2018   HGBA1C 6.9 (H) 01/01/2018   HGBA1C 7.4 (H) 10/17/2017   HGBA1C 6.6 (H) 05/24/2017   HGBA1C 6.7 (H) 06/14/2016   Lab Results  Component Value Date   INSULIN 17.1 04/14/2018   INSULIN 14.9 01/01/2018   CBC    Component Value Date/Time   WBC 6.2 01/01/2018 1136   WBC 6.1 10/17/2017 1416   RBC 4.21 01/01/2018 1136   RBC 4.24 10/17/2017 1416   HGB 12.5 01/01/2018 1136    HCT 38.0 01/01/2018 1136   PLT 305 01/01/2018 1136   MCV 90 01/01/2018 1136   MCH 29.7 01/01/2018 1136   MCH 30.6 12/26/2013 1540   MCHC 32.9 01/01/2018 1136   MCHC 33.4 10/17/2017 1416   RDW 12.5 01/01/2018 1136   LYMPHSABS 2.3 01/01/2018 1136   MONOABS 0.5 10/17/2017 1416   EOSABS 0.4 01/01/2018 1136   BASOSABS 0.1 01/01/2018 1136   Iron/TIBC/Ferritin/ %Sat No results found for: IRON, TIBC, FERRITIN, IRONPCTSAT Lipid Panel     Component Value Date/Time   CHOL 115 04/14/2018 1019   TRIG 75 04/14/2018 1019   HDL 40 04/14/2018 1019   CHOLHDL 3.4 01/01/2018 1136   CHOLHDL 3 10/17/2017 1416   VLDL 33.0 10/17/2017 1416   LDLCALC 60 04/14/2018 1019   LDLDIRECT 193.3 11/06/2012 0926   Hepatic Function Panel     Component Value Date/Time   PROT 6.5 04/14/2018 1019   ALBUMIN 4.4 04/14/2018 1019   AST 29 04/14/2018 1019   ALT 23 04/14/2018 1019   ALKPHOS 45 04/14/2018 1019   BILITOT 0.4 04/14/2018 1019   BILIDIR 0.1 08/09/2014 1628      Component Value Date/Time   TSH 2.040 04/14/2018 1019   TSH 6.010 (H) 01/01/2018 1136   TSH 2.21 10/17/2017 1416     Ref. Range 04/14/2018 10:19  Vitamin D, 25-Hydroxy Latest Ref Range: 30.0 - 100.0 ng/mL 54.6    I, Doreene Nest, am acting as Location manager for [employer]. Owens Shark, DO  I have reviewed the above documentation for accuracy and completeness, and I agree with the above. -Jearld Lesch, DO

## 2018-06-10 DIAGNOSIS — E1165 Type 2 diabetes mellitus with hyperglycemia: Secondary | ICD-10-CM

## 2018-06-10 DIAGNOSIS — E66813 Obesity, class 3: Secondary | ICD-10-CM

## 2018-06-10 DIAGNOSIS — Z6841 Body Mass Index (BMI) 40.0 and over, adult: Secondary | ICD-10-CM

## 2018-06-10 HISTORY — DX: Type 2 diabetes mellitus with hyperglycemia: E11.65

## 2018-06-10 HISTORY — DX: Obesity, class 3: E66.813

## 2018-06-10 HISTORY — DX: Morbid (severe) obesity due to excess calories: E66.01

## 2018-06-19 ENCOUNTER — Ambulatory Visit (INDEPENDENT_AMBULATORY_CARE_PROVIDER_SITE_OTHER): Payer: Self-pay | Admitting: Bariatrics

## 2018-06-19 ENCOUNTER — Other Ambulatory Visit: Payer: Self-pay

## 2018-06-19 ENCOUNTER — Encounter (INDEPENDENT_AMBULATORY_CARE_PROVIDER_SITE_OTHER): Payer: Self-pay | Admitting: Family Medicine

## 2018-06-19 ENCOUNTER — Ambulatory Visit (INDEPENDENT_AMBULATORY_CARE_PROVIDER_SITE_OTHER): Payer: Medicare Other | Admitting: Family Medicine

## 2018-06-19 DIAGNOSIS — E66813 Obesity, class 3: Secondary | ICD-10-CM

## 2018-06-19 DIAGNOSIS — E559 Vitamin D deficiency, unspecified: Secondary | ICD-10-CM

## 2018-06-19 DIAGNOSIS — Z6841 Body Mass Index (BMI) 40.0 and over, adult: Secondary | ICD-10-CM

## 2018-06-19 DIAGNOSIS — E1165 Type 2 diabetes mellitus with hyperglycemia: Secondary | ICD-10-CM | POA: Diagnosis not present

## 2018-06-22 NOTE — Progress Notes (Signed)
Office: [phone]  /  Fax: [phone] TeleHealth Visit:  Amy Mahoney has verbally consented to this TeleHealth visit today. The patient is located at home, the provider is located at the [employer] [facility] office. The participants in this visit include the listed provider and patient. The visit was conducted today via webex.  HPI:   Chief Complaint: OBESITY Amy Mahoney is here to discuss her progress with her obesity treatment plan. She is on the Category 2 plan + 250 calories and is following her eating plan approximately 60 % of the time. She states she is working out with her brother doing cardio and strength training for 30 minutes every other day. Amy Mahoney is having a difficult couple of weeks secondary to her mother having a broken pelvis, and dog being diagnosed with congestive heart failure. Mentally and emotionally she is struggling. She is not always eating on the plan, so she is often going for convenience. We were unable to weigh the patient today for this TeleHealth visit. She feels as if she has gained weight since her last visit. She has lost 17 lbs since starting treatment with [name].  Diabetes II with Hyperglycemia Amy Mahoney has a diagnosis of diabetes type II. Amy Mahoney's last Hgb A1c was 6.0 and insulin was 17.1. she is on metformin 500 mg PO daily. She denies hypoglycemia. She has been working on intensive lifestyle modifications including diet, exercise, and weight loss to help control her blood glucose levels.  Vitamin D Deficiency Amy Mahoney has a diagnosis of vitamin D deficiency. She is currently taking prescription Vit D. She notes fatigue and denies nausea, vomiting or muscle weakness.  ASSESSMENT AND PLAN:  Type 2 diabetes mellitus with hyperglycemia, without long-term current use of insulin (HCC)  Vitamin D deficiency  Class 3 severe obesity with serious comorbidity and body mass index (BMI) of 40.0 to 44.9 in adult, unspecified obesity type ([place])  PLAN:  Diabetes II with  Hyperglycemia Amy Mahoney has been given extensive diabetes education by myself today including ideal fasting and post-prandial blood glucose readings, individual ideal Hgb A1c goals and hypoglycemia prevention. We discussed the importance of good blood sugar control to decrease the likelihood of diabetic complications such as nephropathy, neuropathy, limb loss, blindness, coronary artery disease, and death. We discussed the importance of intensive lifestyle modification including diet, exercise and weight loss as the first line treatment for diabetes. Amy Mahoney agrees to continue her diabetes medications and we will repeat labs at the end of may or beginning of June. Amy Mahoney agrees to follow up with our clinic in 2 weeks.  Vitamin D Deficiency Amy Mahoney was informed that low vitamin D levels contributes to fatigue and are associated with obesity, breast, and colon cancer. Amy Mahoney agrees to continue taking prescription Vit D @50 ,000 IU every week and will follow up for routine testing of vitamin D, at least 2-3 times per year. She was informed of the risk of over-replacement of vitamin D and agrees to not increase her dose unless she discusses this with [name] first. Amy Mahoney agrees to follow up with our clinic in 2 weeks.  Obesity Amy Mahoney is currently in the action stage of change. As such, her goal is to continue with weight loss efforts She has agreed to follow the Category 2 plan Amy Mahoney has been instructed to work up to a goal of 150 minutes of combined cardio and strengthening exercise per week for weight loss and overall health benefits. We discussed the following Behavioral Modification Strategies today: increasing lean protein intake,  decreasing simple carbohydrates, decrease eating out, work on meal planning and easy cooking plans, keeping healthy foods in the home, and planning for success   Amy Mahoney has agreed to follow up with our clinic in 2 weeks. She was informed of the importance of frequent follow up visits to maximize her  success with intensive lifestyle modifications for her multiple health conditions.  ALLERGIES: No Known Allergies  MEDICATIONS: Current Outpatient Medications on File Prior to Visit  Medication Sig Dispense Refill  . atorvastatin (LIPITOR) 40 MG tablet TAKE 1 TABLET BY MOUTH  DAILY 90 tablet 1  . Calcium Carbonate-Vitamin D (CALCIUM 500 + D PO) Take 1 tablet by mouth 2 (two) times daily.      . fenofibrate 160 MG tablet TAKE 1 TABLET BY MOUTH  DAILY 90 tablet 1  . fluticasone (FLONASE) 50 MCG/ACT nasal spray Place 2 sprays into both nostrils daily. 16 g 6  . levothyroxine (SYNTHROID) 112 MCG tablet Take 1 tablet (112 mcg total) by mouth daily before breakfast. 30 tablet 0  . lisinopril (PRINIVIL,ZESTRIL) 5 MG tablet Take 1 tablet (5 mg total) by mouth daily. 90 tablet 0  . loratadine (CLARITIN) 10 MG tablet Take 1 tablet (10 mg total) by mouth daily. 30 tablet 11  . metFORMIN (GLUCOPHAGE) 500 MG tablet Take 1 tablet (500 mg total) by mouth daily with breakfast. 90 tablet 0  . Multiple Vitamin (MULTIVITAMIN) tablet Take 1 tablet by mouth daily.      [name] venlafaxine XR (EFFEXOR-XR) 150 MG 24 hr capsule TAKE 1 CAPSULE BY MOUTH  DAILY 90 capsule 1  . Vitamin D, Ergocalciferol, (DRISDOL) 1.25 MG (50000 UT) CAPS capsule Take 1 capsule (50,000 Units total) by mouth every 7 (seven) days. 12 capsule 0  . vitamin E 400 UNIT capsule Take 400 Units by mouth daily.     No current facility-administered medications on file prior to visit.     PAST MEDICAL HISTORY: Past Medical History:  Diagnosis Date  . Anxiety   . Back pain   . Constipation   . Depression   . Diabetes mellitus without complication ([place])   . Gallbladder problem   . Heart murmur   . Hyperlipidemia   . Hypertension   . Prediabetes   . Sleep apnea    On CPAP  . Spider bite   . Swelling   . Thyroid disease    hypothyroidism    PAST SURGICAL HISTORY: Past Surgical History:  Procedure Laterality Date  . CHOLECYSTECTOMY     . DILATION AND CURETTAGE OF UTERUS    . ESOPHAGOGASTRODUODENOSCOPY (EGD) WITH PROPOFOL N/A 09/05/2017   Procedure: ESOPHAGOGASTRODUODENOSCOPY (EGD) WITH PROPOFOL;  Surgeon: Jerene Bears, MD;  Location: [facility];  Service: Gastroenterology;  Laterality: N/A;  . fibroids scrapping    . TONSILLECTOMY      SOCIAL HISTORY: Social History   Tobacco Use  . Smoking status: Former Smoker    Packs/day: 0.50    Years: 3.00    Pack years: 1.50    Types: Cigarettes    Last attempt to quit: 08/10/1990    Years since quitting: 27.8  . Smokeless tobacco: Never Used  Substance Use Topics  . Alcohol use: Yes    Comment: rarely  . Drug use: No    FAMILY HISTORY: Family History  Problem Relation Age of Onset  . Diabetes Mother   . Thyroid disease Mother   . Depression Mother   . Depression Unknown   . Hyperlipidemia Unknown   .  Arthritis Unknown   . Diabetes Maternal Aunt   . Breast cancer Paternal Grandmother   . Lung cancer Maternal Aunt        Nonsmoker  . Cancer Maternal Aunt        breast  . Breast cancer Maternal Aunt   . Cancer Maternal Aunt        lung(nonsmoker)  . Colon cancer Neg Hx   . Pancreatic cancer Neg Hx   . Stomach cancer Neg Hx   . Esophageal cancer Neg Hx   . Rectal cancer Neg Hx     ROS: Review of Systems  Constitutional: Positive for malaise/fatigue. Negative for weight loss.  Gastrointestinal: Negative for nausea and vomiting.  Musculoskeletal:       Negative muscle weakness  Endo/Heme/Allergies:       Negative hypoglycemia    PHYSICAL EXAM: Pt in no acute distress  RECENT LABS AND TESTS: BMET    Component Value Date/Time   NA 144 04/14/2018 1019   K 5.1 04/14/2018 1019   CL 106 04/14/2018 1019   CO2 23 04/14/2018 1019   GLUCOSE 126 (H) 04/14/2018 1019   GLUCOSE 150 (H) 10/17/2017 1416   BUN 23 04/14/2018 1019   CREATININE 0.91 04/14/2018 1019   CALCIUM 9.8 04/14/2018 1019   GFRNONAA 65 04/14/2018 1019   GFRAA 75 04/14/2018 1019    Lab Results  Component Value Date   HGBA1C 6.0 (H) 04/14/2018   HGBA1C 6.9 (H) 01/01/2018   HGBA1C 7.4 (H) 10/17/2017   HGBA1C 6.6 (H) 05/24/2017   HGBA1C 6.7 (H) 06/14/2016   Lab Results  Component Value Date   INSULIN 17.1 04/14/2018   INSULIN 14.9 01/01/2018   CBC    Component Value Date/Time   WBC 6.2 01/01/2018 1136   WBC 6.1 10/17/2017 1416   RBC 4.21 01/01/2018 1136   RBC 4.24 10/17/2017 1416   HGB 12.5 01/01/2018 1136   HCT 38.0 01/01/2018 1136   PLT 305 01/01/2018 1136   MCV 90 01/01/2018 1136   MCH 29.7 01/01/2018 1136   MCH 30.6 12/26/2013 1540   MCHC 32.9 01/01/2018 1136   MCHC 33.4 10/17/2017 1416   RDW 12.5 01/01/2018 1136   LYMPHSABS 2.3 01/01/2018 1136   MONOABS 0.5 10/17/2017 1416   EOSABS 0.4 01/01/2018 1136   BASOSABS 0.1 01/01/2018 1136   Iron/TIBC/Ferritin/ %Sat No results found for: IRON, TIBC, FERRITIN, IRONPCTSAT Lipid Panel     Component Value Date/Time   CHOL 115 04/14/2018 1019   TRIG 75 04/14/2018 1019   HDL 40 04/14/2018 1019   CHOLHDL 3.4 01/01/2018 1136   CHOLHDL 3 10/17/2017 1416   VLDL 33.0 10/17/2017 1416   LDLCALC 60 04/14/2018 1019   LDLDIRECT 193.3 11/06/2012 0926   Hepatic Function Panel     Component Value Date/Time   PROT 6.5 04/14/2018 1019   ALBUMIN 4.4 04/14/2018 1019   AST 29 04/14/2018 1019   ALT 23 04/14/2018 1019   ALKPHOS 45 04/14/2018 1019   BILITOT 0.4 04/14/2018 1019   BILIDIR 0.1 08/09/2014 1628      Component Value Date/Time   TSH 2.040 04/14/2018 1019   TSH 6.010 (H) 01/01/2018 1136   TSH 2.21 10/17/2017 1416      I, Trixie Dredge, am acting as transcriptionist for Ilene Qua, MD  I have reviewed the above documentation for accuracy and completeness, and I agree with the above. - Ilene Qua, MD

## 2018-06-27 ENCOUNTER — Other Ambulatory Visit (INDEPENDENT_AMBULATORY_CARE_PROVIDER_SITE_OTHER): Payer: Self-pay | Admitting: Bariatrics

## 2018-06-27 DIAGNOSIS — E038 Other specified hypothyroidism: Secondary | ICD-10-CM

## 2018-07-03 ENCOUNTER — Ambulatory Visit (INDEPENDENT_AMBULATORY_CARE_PROVIDER_SITE_OTHER): Payer: Medicare Other | Admitting: Family Medicine

## 2018-07-03 ENCOUNTER — Other Ambulatory Visit: Payer: Self-pay

## 2018-07-03 ENCOUNTER — Encounter (INDEPENDENT_AMBULATORY_CARE_PROVIDER_SITE_OTHER): Payer: Self-pay | Admitting: Family Medicine

## 2018-07-03 DIAGNOSIS — Z6841 Body Mass Index (BMI) 40.0 and over, adult: Secondary | ICD-10-CM

## 2018-07-03 DIAGNOSIS — E1165 Type 2 diabetes mellitus with hyperglycemia: Secondary | ICD-10-CM

## 2018-07-03 DIAGNOSIS — E038 Other specified hypothyroidism: Secondary | ICD-10-CM | POA: Diagnosis not present

## 2018-07-03 MED ORDER — LEVOTHYROXINE SODIUM 112 MCG PO TABS: 112.0000 ug | ORAL_TABLET | Freq: Every day | ORAL | 0 refills | Status: DC

## 2018-07-03 NOTE — Progress Notes (Signed)
Office: [phone]  /  Fax: [phone] TeleHealth Visit:  Amy Mahoney has verbally consented to this TeleHealth visit today. The patient is located at home, the provider is located at the [facility] office. The participants in this visit include the listed provider and patient. The visit was conducted today via webex.  HPI:   Chief Complaint: OBESITY Amy Mahoney is here to discuss her progress with her obesity treatment plan. She is on the Category 2 plan and is following her eating plan approximately 80 % of the time. She states she is doing resistance training for 30 minutes 3-4 times per week. Amy Mahoney had an emotional few weeks, secondary to her mother having a stroke. She is working out with her brother. The last 20 %, she felt sick to her stomach with concern over her mother. She has no problem finding any meat.  We were unable to weigh the patient today for this TeleHealth visit. She feels as if she has lost 1-2 lbs since her last visit. She has lost 17-19 lbs since starting treatment with [name].  Hypothyroidism Amy Mahoney has a diagnosis of hypothyroidism. She is currently on levothyroxine. She denies hot or cold intolerance or palpitations, but notes improving fatigue.  Diabetes II with Hyperglycemia Amy Mahoney has a diagnosis of diabetes type II. Amy Mahoney denies GI side effects of metformin. She denies feelings of hypoglycemia. Last A1c was 6.0. She has been working on intensive lifestyle modifications including diet, exercise, and weight loss to help control her blood glucose levels.  ASSESSMENT AND PLAN:  Other specified hypothyroidism - Plan: levothyroxine (SYNTHROID) 112 MCG tablet  Type 2 diabetes mellitus with hyperglycemia, without long-term current use of insulin (HCC)  Class 3 severe obesity with serious comorbidity and body mass index (BMI) of 40.0 to 44.9 in adult, unspecified obesity type ([place])  PLAN:  Hypothyroidism Amy Mahoney was informed of the importance of good thyroid  control to help with weight loss efforts. She was also informed that supertheraputic thyroid levels are dangerous and will not improve weight loss results. Amy Mahoney agrees to continue taking levothyroxine 112 mcg PO q AM #30 and we will refill for 1 month. Amy Mahoney agrees to follow up with our clinic in 2 weeks.  Diabetes II with Hyperglycemia Amy Mahoney has been given extensive diabetes education by myself today including ideal fasting and post-prandial blood glucose readings, individual ideal Hgb A1c goals and hypoglycemia prevention. We discussed the importance of good blood sugar control to decrease the likelihood of diabetic complications such as nephropathy, neuropathy, limb loss, blindness, coronary artery disease, and death. We discussed the importance of intensive lifestyle modification including diet, exercise and weight loss as the first line treatment for diabetes. Amy Mahoney agrees to continue her diabetes medications, and we will repeat labs at her first in person appointment. Amy Mahoney agrees to follow up with our clinic in 2 weeks.  Obesity Amy Mahoney is currently in the action stage of change. As such, her goal is to continue with weight loss efforts She has agreed to follow the Category 2 plan Amy Mahoney has been instructed to work up to a goal of 150 minutes of combined cardio and strengthening exercise per week for weight loss and overall health benefits. We discussed the following Behavioral Modification Strategies today: increasing lean protein intake, increasing vegetables, work on meal planning and easy cooking plans and ways to avoid boredom eating, keeping healthy foods in the home, and planning for success   Amy Mahoney has agreed to follow up with our clinic in  2 weeks. She was informed of the importance of frequent follow up visits to maximize her success with intensive lifestyle modifications for her multiple health conditions.  ALLERGIES: No Known Allergies  MEDICATIONS: Current Outpatient Medications on File  Prior to Visit  Medication Sig Dispense Refill  . atorvastatin (LIPITOR) 40 MG tablet TAKE 1 TABLET BY MOUTH  DAILY 90 tablet 1  . Calcium Carbonate-Vitamin D (CALCIUM 500 + D PO) Take 1 tablet by mouth 2 (two) times daily.      . fenofibrate 160 MG tablet TAKE 1 TABLET BY MOUTH  DAILY 90 tablet 1  . fluticasone (FLONASE) 50 MCG/ACT nasal spray Place 2 sprays into both nostrils daily. 16 g 6  . lisinopril (PRINIVIL,ZESTRIL) 5 MG tablet Take 1 tablet (5 mg total) by mouth daily. 90 tablet 0  . loratadine (CLARITIN) 10 MG tablet Take 1 tablet (10 mg total) by mouth daily. 30 tablet 11  . metFORMIN (GLUCOPHAGE) 500 MG tablet Take 1 tablet (500 mg total) by mouth daily with breakfast. 90 tablet 0  . Multiple Vitamin (MULTIVITAMIN) tablet Take 1 tablet by mouth daily.      [name] venlafaxine XR (EFFEXOR-XR) 150 MG 24 hr capsule TAKE 1 CAPSULE BY MOUTH  DAILY 90 capsule 1  . Vitamin D, Ergocalciferol, (DRISDOL) 1.25 MG (50000 UT) CAPS capsule Take 1 capsule (50,000 Units total) by mouth every 7 (seven) days. 12 capsule 0  . vitamin E 400 UNIT capsule Take 400 Units by mouth daily.     No current facility-administered medications on file prior to visit.     PAST MEDICAL HISTORY: Past Medical History:  Diagnosis Date  . Anxiety   . Back pain   . Constipation   . Depression   . Diabetes mellitus without complication ([place])   . Gallbladder problem   . Heart murmur   . Hyperlipidemia   . Hypertension   . Prediabetes   . Sleep apnea    On CPAP  . Spider bite   . Swelling   . Thyroid disease    hypothyroidism    PAST SURGICAL HISTORY: Past Surgical History:  Procedure Laterality Date  . CHOLECYSTECTOMY    . DILATION AND CURETTAGE OF UTERUS    . ESOPHAGOGASTRODUODENOSCOPY (EGD) WITH PROPOFOL N/A 09/05/2017   Procedure: ESOPHAGOGASTRODUODENOSCOPY (EGD) WITH PROPOFOL;  Surgeon: Jerene Bears, MD;  Location: [facility];  Service: Gastroenterology;  Laterality: N/A;  . fibroids scrapping    .  TONSILLECTOMY      SOCIAL HISTORY: Social History   Tobacco Use  . Smoking status: Former Smoker    Packs/day: 0.50    Years: 3.00    Pack years: 1.50    Types: Cigarettes    Last attempt to quit: 08/10/1990    Years since quitting: 27.9  . Smokeless tobacco: Never Used  Substance Use Topics  . Alcohol use: Yes    Comment: rarely  . Drug use: No    FAMILY HISTORY: Family History  Problem Relation Age of Onset  . Diabetes Mother   . Thyroid disease Mother   . Depression Mother   . Depression Unknown   . Hyperlipidemia Unknown   . Arthritis Unknown   . Diabetes Maternal Aunt   . Breast cancer Paternal Grandmother   . Lung cancer Maternal Aunt        Nonsmoker  . Cancer Maternal Aunt        breast  . Breast cancer Maternal Aunt   . Cancer Maternal Aunt  lung(nonsmoker)  . Colon cancer Neg Hx   . Pancreatic cancer Neg Hx   . Stomach cancer Neg Hx   . Esophageal cancer Neg Hx   . Rectal cancer Neg Hx     ROS: Review of Systems  Constitutional: Positive for malaise/fatigue and weight loss.  Cardiovascular: Negative for palpitations.  Endo/Heme/Allergies:       Negative hypoglycemia Negative hot/cold intolerance    PHYSICAL EXAM: Pt in no acute distress  RECENT LABS AND TESTS: BMET    Component Value Date/Time   NA 144 04/14/2018 1019   K 5.1 04/14/2018 1019   CL 106 04/14/2018 1019   CO2 23 04/14/2018 1019   GLUCOSE 126 (H) 04/14/2018 1019   GLUCOSE 150 (H) 10/17/2017 1416   BUN 23 04/14/2018 1019   CREATININE 0.91 04/14/2018 1019   CALCIUM 9.8 04/14/2018 1019   GFRNONAA 65 04/14/2018 1019   GFRAA 75 04/14/2018 1019   Lab Results  Component Value Date   HGBA1C 6.0 (H) 04/14/2018   HGBA1C 6.9 (H) 01/01/2018   HGBA1C 7.4 (H) 10/17/2017   HGBA1C 6.6 (H) 05/24/2017   HGBA1C 6.7 (H) 06/14/2016   Lab Results  Component Value Date   INSULIN 17.1 04/14/2018   INSULIN 14.9 01/01/2018   CBC    Component Value Date/Time   WBC 6.2  01/01/2018 1136   WBC 6.1 10/17/2017 1416   RBC 4.21 01/01/2018 1136   RBC 4.24 10/17/2017 1416   HGB 12.5 01/01/2018 1136   HCT 38.0 01/01/2018 1136   PLT 305 01/01/2018 1136   MCV 90 01/01/2018 1136   MCH 29.7 01/01/2018 1136   MCH 30.6 12/26/2013 1540   MCHC 32.9 01/01/2018 1136   MCHC 33.4 10/17/2017 1416   RDW 12.5 01/01/2018 1136   LYMPHSABS 2.3 01/01/2018 1136   MONOABS 0.5 10/17/2017 1416   EOSABS 0.4 01/01/2018 1136   BASOSABS 0.1 01/01/2018 1136   Iron/TIBC/Ferritin/ %Sat No results found for: IRON, TIBC, FERRITIN, IRONPCTSAT Lipid Panel     Component Value Date/Time   CHOL 115 04/14/2018 1019   TRIG 75 04/14/2018 1019   HDL 40 04/14/2018 1019   CHOLHDL 3.4 01/01/2018 1136   CHOLHDL 3 10/17/2017 1416   VLDL 33.0 10/17/2017 1416   LDLCALC 60 04/14/2018 1019   LDLDIRECT 193.3 11/06/2012 0926   Hepatic Function Panel     Component Value Date/Time   PROT 6.5 04/14/2018 1019   ALBUMIN 4.4 04/14/2018 1019   AST 29 04/14/2018 1019   ALT 23 04/14/2018 1019   ALKPHOS 45 04/14/2018 1019   BILITOT 0.4 04/14/2018 1019   BILIDIR 0.1 08/09/2014 1628      Component Value Date/Time   TSH 2.040 04/14/2018 1019   TSH 6.010 (H) 01/01/2018 1136   TSH 2.21 10/17/2017 1416      I, Trixie Dredge, am acting as transcriptionist for Ilene Qua, MD  I have reviewed the above documentation for accuracy and completeness, and I agree with the above. - Ilene Qua, MD

## 2018-07-08 ENCOUNTER — Ambulatory Visit: Payer: Medicare Other | Admitting: Internal Medicine

## 2018-07-16 ENCOUNTER — Ambulatory Visit: Payer: Medicare Other | Admitting: Internal Medicine

## 2018-07-17 ENCOUNTER — Ambulatory Visit (INDEPENDENT_AMBULATORY_CARE_PROVIDER_SITE_OTHER): Payer: Medicare Other | Admitting: Family Medicine

## 2018-07-17 ENCOUNTER — Encounter (INDEPENDENT_AMBULATORY_CARE_PROVIDER_SITE_OTHER): Payer: Self-pay | Admitting: Family Medicine

## 2018-07-17 ENCOUNTER — Other Ambulatory Visit: Payer: Self-pay

## 2018-07-17 DIAGNOSIS — E119 Type 2 diabetes mellitus without complications: Secondary | ICD-10-CM | POA: Diagnosis not present

## 2018-07-17 DIAGNOSIS — Z6841 Body Mass Index (BMI) 40.0 and over, adult: Secondary | ICD-10-CM | POA: Diagnosis not present

## 2018-07-17 DIAGNOSIS — E559 Vitamin D deficiency, unspecified: Secondary | ICD-10-CM

## 2018-07-20 NOTE — Progress Notes (Signed)
Office: [phone]  /  Fax: [phone] TeleHealth Visit:  Amy Mahoney has verbally consented to this TeleHealth visit today. The patient is located at home, the provider is located at the [facility] [employer] [facility] office. The participants in this visit include the listed provider and patient. The visit was conducted today via webex.  HPI:   Chief Complaint: OBESITY Amy Mahoney is here to discuss her progress with her obesity treatment plan. She is on the Category 2 plan and is following her eating plan approximately 95 % of the time. She states she is doing resistance training for 30 minutes 3-4 times per week. Amy Mahoney did initially put on 6 lbs in the past few weeks, but she has lost 3 lbs. She states her weight is of 241 lbs. She has had more stress in the past few weeks secondary to her house prep for her mother's return home from in patient rehabilitation.  We were unable to weigh the patient today for this TeleHealth visit. She feels as if she has gained 3 lbs since her last visit. She has lost 17-19 lbs since starting treatment with [name].  Vitamin D Deficiency Amy Mahoney has a diagnosis of vitamin D deficiency. She is currently taking prescription Vit D. She notes fatigue and denies nausea, vomiting or muscle weakness.  Diabetes II with Hyperglycemia Amy Mahoney has a diagnosis of diabetes type II. Amy Mahoney is only on metformin. She states she is not checking her BGs at home. Last Hgb A1c was 6.0. She has been working on intensive lifestyle modifications including diet, exercise, and weight loss to help control her blood glucose levels.  ASSESSMENT AND PLAN:  Vitamin D deficiency  Type 2 diabetes mellitus without complication, without long-term current use of insulin (HCC)  Class 3 severe obesity with serious comorbidity and body mass index (BMI) of 40.0 to 44.9 in adult, unspecified obesity type ([place])  PLAN:  Vitamin D Deficiency Amy Mahoney was informed that low vitamin D levels contributes to fatigue and  are associated with obesity, breast, and colon cancer. Amy Mahoney agrees to continue taking prescription Vit D @50 ,000 IU every week, no refill needed. She will follow up for routine testing of vitamin D, at least 2-3 times per year. She was informed of the risk of over-replacement of vitamin D and agrees to not increase her dose unless she discusses this with [name] first. Amy Mahoney agrees to follow up with our clinic in 2 weeks.  Diabetes II with Hyperglycemia Amy Mahoney has been given extensive diabetes education by myself today including ideal fasting and post-prandial blood glucose readings, individual ideal Hgb A1c goals and hypoglycemia prevention. We discussed the importance of good blood sugar control to decrease the likelihood of diabetic complications such as nephropathy, neuropathy, limb loss, blindness, coronary artery disease, and death. We discussed the importance of intensive lifestyle modification including diet, exercise and weight loss as the first line treatment for diabetes. Amy Mahoney agrees to continue taking metformin and we will repeat labs in early July. Amy Mahoney agrees to follow up with our clinic in 2 weeks.  Obesity Amy Mahoney is currently in the action stage of change. As such, her goal is to continue with weight loss efforts She has agreed to follow the Category 2 plan [place] has been instructed to work up to a goal of 150 minutes of combined cardio and strengthening exercise per week for weight loss and overall health benefits. We discussed the following Behavioral Modification Strategies today: increasing lean protein intake, increasing vegetables and work on meal planning  and easy cooking plans, keeping healthy foods in the home, better snacking choices, and planning for success   Amy Mahoney has agreed to follow up with our clinic in 2 weeks. She was informed of the importance of frequent follow up visits to maximize her success with intensive lifestyle modifications for her multiple health conditions.   ALLERGIES: No Known Allergies  MEDICATIONS: Current Outpatient Medications on File Prior to Visit  Medication Sig Dispense Refill  . atorvastatin (LIPITOR) 40 MG tablet TAKE 1 TABLET BY MOUTH  DAILY 90 tablet 1  . Calcium Carbonate-Vitamin D (CALCIUM 500 + D PO) Take 1 tablet by mouth 2 (two) times daily.      . fenofibrate 160 MG tablet TAKE 1 TABLET BY MOUTH  DAILY 90 tablet 1  . fluticasone (FLONASE) 50 MCG/ACT nasal spray Place 2 sprays into both nostrils daily. 16 g 6  . levothyroxine (SYNTHROID) 112 MCG tablet Take 1 tablet (112 mcg total) by mouth daily before breakfast. 30 tablet 0  . lisinopril (PRINIVIL,ZESTRIL) 5 MG tablet Take 1 tablet (5 mg total) by mouth daily. 90 tablet 0  . loratadine (CLARITIN) 10 MG tablet Take 1 tablet (10 mg total) by mouth daily. 30 tablet 11  . metFORMIN (GLUCOPHAGE) 500 MG tablet Take 1 tablet (500 mg total) by mouth daily with breakfast. 90 tablet 0  . Multiple Vitamin (MULTIVITAMIN) tablet Take 1 tablet by mouth daily.      [name] venlafaxine XR (EFFEXOR-XR) 150 MG 24 hr capsule TAKE 1 CAPSULE BY MOUTH  DAILY 90 capsule 1  . Vitamin D, Ergocalciferol, (DRISDOL) 1.25 MG (50000 UT) CAPS capsule Take 1 capsule (50,000 Units total) by mouth every 7 (seven) days. 12 capsule 0  . vitamin E 400 UNIT capsule Take 400 Units by mouth daily.     No current facility-administered medications on file prior to visit.     PAST MEDICAL HISTORY: Past Medical History:  Diagnosis Date  . Anxiety   . Back pain   . Constipation   . Depression   . Diabetes mellitus without complication ([place])   . Gallbladder problem   . Heart murmur   . Hyperlipidemia   . Hypertension   . Prediabetes   . Sleep apnea    On CPAP  . Spider bite   . Swelling   . Thyroid disease    hypothyroidism    PAST SURGICAL HISTORY: Past Surgical History:  Procedure Laterality Date  . CHOLECYSTECTOMY    . DILATION AND CURETTAGE OF UTERUS    . ESOPHAGOGASTRODUODENOSCOPY (EGD) WITH  PROPOFOL N/A 09/05/2017   Procedure: ESOPHAGOGASTRODUODENOSCOPY (EGD) WITH PROPOFOL;  Surgeon: Jerene Bears, MD;  Location: [facility];  Service: Gastroenterology;  Laterality: N/A;  . fibroids scrapping    . TONSILLECTOMY      SOCIAL HISTORY: Social History   Tobacco Use  . Smoking status: Former Smoker    Packs/day: 0.50    Years: 3.00    Pack years: 1.50    Types: Cigarettes    Last attempt to quit: 08/10/1990    Years since quitting: 27.9  . Smokeless tobacco: Never Used  Substance Use Topics  . Alcohol use: Yes    Comment: rarely  . Drug use: No    FAMILY HISTORY: Family History  Problem Relation Age of Onset  . Diabetes Mother   . Thyroid disease Mother   . Depression Mother   . Depression Unknown   . Hyperlipidemia Unknown   . Arthritis Unknown   . Diabetes  Maternal Aunt   . Breast cancer Paternal Grandmother   . Lung cancer Maternal Aunt        Nonsmoker  . Cancer Maternal Aunt        breast  . Breast cancer Maternal Aunt   . Cancer Maternal Aunt        lung(nonsmoker)  . Colon cancer Neg Hx   . Pancreatic cancer Neg Hx   . Stomach cancer Neg Hx   . Esophageal cancer Neg Hx   . Rectal cancer Neg Hx     ROS: Review of Systems  Constitutional: Positive for malaise/fatigue. Negative for weight loss.  Gastrointestinal: Negative for nausea and vomiting.  Musculoskeletal:       Negative muscle weakness  Endo/Heme/Allergies:       Negative hypoglycemia    PHYSICAL EXAM: Pt in no acute distress  RECENT LABS AND TESTS: BMET    Component Value Date/Time   NA 144 04/14/2018 1019   K 5.1 04/14/2018 1019   CL 106 04/14/2018 1019   CO2 23 04/14/2018 1019   GLUCOSE 126 (H) 04/14/2018 1019   GLUCOSE 150 (H) 10/17/2017 1416   BUN 23 04/14/2018 1019   CREATININE 0.91 04/14/2018 1019   CALCIUM 9.8 04/14/2018 1019   GFRNONAA 65 04/14/2018 1019   GFRAA 75 04/14/2018 1019   Lab Results  Component Value Date   HGBA1C 6.0 (H) 04/14/2018   HGBA1C 6.9  (H) 01/01/2018   HGBA1C 7.4 (H) 10/17/2017   HGBA1C 6.6 (H) 05/24/2017   HGBA1C 6.7 (H) 06/14/2016   Lab Results  Component Value Date   INSULIN 17.1 04/14/2018   INSULIN 14.9 01/01/2018   CBC    Component Value Date/Time   WBC 6.2 01/01/2018 1136   WBC 6.1 10/17/2017 1416   RBC 4.21 01/01/2018 1136   RBC 4.24 10/17/2017 1416   HGB 12.5 01/01/2018 1136   HCT 38.0 01/01/2018 1136   PLT 305 01/01/2018 1136   MCV 90 01/01/2018 1136   MCH 29.7 01/01/2018 1136   MCH 30.6 12/26/2013 1540   MCHC 32.9 01/01/2018 1136   MCHC 33.4 10/17/2017 1416   RDW 12.5 01/01/2018 1136   LYMPHSABS 2.3 01/01/2018 1136   MONOABS 0.5 10/17/2017 1416   EOSABS 0.4 01/01/2018 1136   BASOSABS 0.1 01/01/2018 1136   Iron/TIBC/Ferritin/ %Sat No results found for: IRON, TIBC, FERRITIN, IRONPCTSAT Lipid Panel     Component Value Date/Time   CHOL 115 04/14/2018 1019   TRIG 75 04/14/2018 1019   HDL 40 04/14/2018 1019   CHOLHDL 3.4 01/01/2018 1136   CHOLHDL 3 10/17/2017 1416   VLDL 33.0 10/17/2017 1416   LDLCALC 60 04/14/2018 1019   LDLDIRECT 193.3 11/06/2012 0926   Hepatic Function Panel     Component Value Date/Time   PROT 6.5 04/14/2018 1019   ALBUMIN 4.4 04/14/2018 1019   AST 29 04/14/2018 1019   ALT 23 04/14/2018 1019   ALKPHOS 45 04/14/2018 1019   BILITOT 0.4 04/14/2018 1019   BILIDIR 0.1 08/09/2014 1628      Component Value Date/Time   TSH 2.040 04/14/2018 1019   TSH 6.010 (H) 01/01/2018 1136   TSH 2.21 10/17/2017 1416      I, Trixie Dredge, am acting as transcriptionist for Ilene Qua, MD  I have reviewed the above documentation for accuracy and completeness, and I agree with the above. - Ilene Qua, MD

## 2018-07-25 ENCOUNTER — Other Ambulatory Visit (INDEPENDENT_AMBULATORY_CARE_PROVIDER_SITE_OTHER): Payer: Self-pay | Admitting: Family Medicine

## 2018-07-25 DIAGNOSIS — E038 Other specified hypothyroidism: Secondary | ICD-10-CM

## 2018-07-28 ENCOUNTER — Encounter: Payer: Self-pay | Admitting: Internal Medicine

## 2018-07-28 ENCOUNTER — Ambulatory Visit (INDEPENDENT_AMBULATORY_CARE_PROVIDER_SITE_OTHER): Payer: Medicare Other | Admitting: Internal Medicine

## 2018-07-28 ENCOUNTER — Other Ambulatory Visit: Payer: Self-pay

## 2018-07-28 DIAGNOSIS — G4733 Obstructive sleep apnea (adult) (pediatric): Secondary | ICD-10-CM

## 2018-07-28 NOTE — Patient Instructions (Signed)
We can continue CPAP auto 10-20, mask of choice, humidifier, supplies, AirView/ card  Please call us if we can help

## 2018-07-28 NOTE — Progress Notes (Signed)
HPI female former smoker followed for OSA, Cough, complicated by obesity, hypothyroid, GERD NPSG 08/03/94 RDI/AHI 49/hr, weight was 285 pounds  ----------------------------------------------------------------------------  07/11/2017- 67 year old female former smoker followed for OSA, Cough, complicated by obesity, hypothyroid, GERD CPAP auto 10-20/Advanced -----OSA; DME: [facility] Pt wears CPAP nightly and DL attached. Pressure works well and supplies needed.  Very pleased with CPAP which he uses every night-sleeps much better. Download 100% compliance AHI 8.9/hour which is unchanged from switch to AutoSet 10-20.  She is not bothered by snoring or daytime sleepiness. Pending GI follow-up for significant GERD.  07/28/2018- 68 year old female former smoker followed for OSA, Cough, complicated by obesity, hypothyroid, GERD CPAP auto 10-20/Adapt Download compliance 97%, AHI 6/ hr -----OSA on Cpap no complaints She is satisfied with CPAP. No concerns. Discussed goals. Spring pollen rhinitis with nasal congestion  ROS-see HPI   + = positive Constitutional:   No-   weight loss, night sweats, fevers, chills, fatigue, lassitude. HEENT:   No-  headaches, difficulty swallowing, tooth/dental problems, sore throat,       No-  sneezing, itching, ear ache, +nasal congestion, post nasal drip,  CV:  No-   chest pain, orthopnea, PND, swelling in lower extremities, anasarca,  dizziness, palpitations Resp: No-   shortness of breath with exertion or at rest.               productive cough,   non-productive cough,  No- coughing up of blood.              No-   change in color of mucus.  No- wheezing.   Skin: No-   rash or lesions. GI:  +heartburn, indigestion, abdominal pain, nausea, vomiting, GU: . MS:  No-   joint pain or swelling.   Neuro-     nothing unusual Psych:  No- change in mood or affect. No depression or anxiety.  No memory loss.  OBJ- Physical Exam General- Alert, Oriented, Affect-appropriate,  Distress- none acute, + obese Skin- rash-none, lesions- none, excoriation- none Lymphadenopathy- none Head- atraumatic            Eyes- Gross vision intact, PERRLA, conjunctivae and secretions clear            Ears- Hearing, canals-normal            Nose- Clear, no-Septal dev, mucus, polyps, erosion, perforation             Throat- Mallampati III-IV , mucosa clear , drainage- none, tonsils- atrophic Neck- flexible , trachea midline, no stridor , thyroid nl, carotid no bruit Chest - symmetrical excursion , unlabored           Heart/CV- RRR , no murmur , no gallop  , no rub, nl s1 s2                           - JVD- none , edema- none, stasis changes- none, varices- none           Lung- clear, wheeze- none, cough- none , dullness-none, rub- none           Chest wall-  Abd-  Br/ Gen/ Rectal- Not done, not indicated Extrem- cyanosis- none, clubbing, none, atrophy- none, strength- nl Neuro- grossly intact to observation   .

## 2018-07-30 ENCOUNTER — Other Ambulatory Visit (INDEPENDENT_AMBULATORY_CARE_PROVIDER_SITE_OTHER): Payer: Self-pay | Admitting: Family Medicine

## 2018-07-30 DIAGNOSIS — E559 Vitamin D deficiency, unspecified: Secondary | ICD-10-CM

## 2018-08-01 DIAGNOSIS — G4733 Obstructive sleep apnea (adult) (pediatric): Secondary | ICD-10-CM | POA: Diagnosis not present

## 2018-08-04 ENCOUNTER — Ambulatory Visit (INDEPENDENT_AMBULATORY_CARE_PROVIDER_SITE_OTHER): Payer: Medicare Other | Admitting: Family Medicine

## 2018-08-04 ENCOUNTER — Encounter (INDEPENDENT_AMBULATORY_CARE_PROVIDER_SITE_OTHER): Payer: Self-pay | Admitting: Family Medicine

## 2018-08-04 ENCOUNTER — Other Ambulatory Visit: Payer: Self-pay

## 2018-08-04 DIAGNOSIS — E559 Vitamin D deficiency, unspecified: Secondary | ICD-10-CM | POA: Diagnosis not present

## 2018-08-04 DIAGNOSIS — E1165 Type 2 diabetes mellitus with hyperglycemia: Secondary | ICD-10-CM | POA: Diagnosis not present

## 2018-08-04 DIAGNOSIS — Z6841 Body Mass Index (BMI) 40.0 and over, adult: Secondary | ICD-10-CM

## 2018-08-04 DIAGNOSIS — E038 Other specified hypothyroidism: Secondary | ICD-10-CM

## 2018-08-04 MED ORDER — METFORMIN HCL 500 MG PO TABS: 500.0000 mg | ORAL_TABLET | Freq: Every day | ORAL | 0 refills | Status: DC

## 2018-08-04 MED ORDER — LEVOTHYROXINE SODIUM 112 MCG PO TABS: 112.0000 ug | ORAL_TABLET | Freq: Every day | ORAL | 0 refills | Status: DC

## 2018-08-04 MED ORDER — LISINOPRIL 5 MG PO TABS: 5.0000 mg | ORAL_TABLET | Freq: Every day | ORAL | 0 refills | Status: DC

## 2018-08-04 MED ORDER — VITAMIN D (ERGOCALCIFEROL) 1.25 MG (50000 UNIT) PO CAPS: 50000.0000 [IU] | ORAL_CAPSULE | ORAL | 0 refills | Status: DC

## 2018-08-05 NOTE — Progress Notes (Signed)
Office: [phone]  /  Fax: [phone] TeleHealth Visit:  Amy Mahoney has verbally consented to this TeleHealth visit today. The patient is located at home, the provider is located at the [facility] office. The participants in this visit include the listed provider and patient. The visit was conducted today via webex.  HPI:   Chief Complaint: OBESITY Amy Mahoney is here to discuss her progress with her obesity treatment plan. She is on the Category 2 plan and is following her eating plan approximately 0 % of the time (unsure). She states she is lifting weights and on the stair stepper for 30 minutes 3-4 times per week. Amy Mahoney is weighing at home. Her weight is of 241 lbs today. Her mother has come back from the hospital and she has been a primary caregiver for her mother. She denies hunger. She struggled with eating the meal plan when her mother first came home, but now she is able to take in all of the food. She id doing a good amount of meal prep and planning.  We were unable to weigh the patient today for this TeleHealth visit. She feels as if she has maintained her weight since her last visit. She has lost 19 lbs since starting treatment with [name].  Vitamin D Deficiency Amy Mahoney has a diagnosis of vitamin D deficiency. She is currently taking prescription Vit D. She notes fatigue and denies nausea, vomiting or muscle weakness.  Hypothyroidism Amy Mahoney has a diagnosis of hypothyroidism. She is currently on Synthroid. She denies hot or cold intolerance or palpitations, but does admit to ongoing fatigue.  Diabetes II with Hyperglycemia Amy Mahoney has a diagnosis of diabetes type II. Amy Mahoney is on lisinopril, metformin, and statin. She denies GI side effects of metformin. Last A1c was 6.0. She denies hypoglycemia. She has been working on intensive lifestyle modifications including diet, exercise, and weight loss to help control her blood glucose levels.  ASSESSMENT AND PLAN:  Vitamin D deficiency  - Plan: Vitamin D, Ergocalciferol, (DRISDOL) 1.25 MG (50000 UT) CAPS capsule  Other specified hypothyroidism - Plan: levothyroxine (SYNTHROID) 112 MCG tablet  Type 2 diabetes mellitus with hyperglycemia, without long-term current use of insulin (HCC) - Plan: lisinopril (ZESTRIL) 5 MG tablet, metFORMIN (GLUCOPHAGE) 500 MG tablet  Class 3 severe obesity with serious comorbidity and body mass index (BMI) of 40.0 to 44.9 in adult, unspecified obesity type ([place])  PLAN:  Vitamin D Deficiency Amy Mahoney was informed that low vitamin D levels contributes to fatigue and are associated with obesity, breast, and colon cancer. Amy Mahoney agrees to continue taking prescription Vit D @50 ,000 IU every week #4 and we will refill for 1 month. She will follow up for routine testing of vitamin D, at least 2-3 times per year. She was informed of the risk of over-replacement of vitamin D and agrees to not increase her dose unless she discusses this with [name] first. Amy Mahoney agrees to follow up with our clinic in 2 weeks.  Hypothyroidism Amy Mahoney was informed of the importance of good thyroid control to help with weight loss efforts. She was also informed that supertheraputic thyroid levels are dangerous and will not improve weight loss results. Amy Mahoney agrees to continue taking Synthroid 112 mcg PO q AM #30 and we will refill for 1 month. Amy Mahoney agrees to follow up with our clinic in 2 weeks.  Diabetes II with Hyperglycemia Amy Mahoney has been given extensive diabetes education by myself today including ideal fasting and post-prandial blood glucose readings, individual ideal Hgb  A1c goals and hypoglycemia prevention. We discussed the importance of good blood sugar control to decrease the likelihood of diabetic complications such as nephropathy, neuropathy, limb loss, blindness, coronary artery disease, and death. We discussed the importance of intensive lifestyle modification including diet, exercise and weight loss as the first line treatment for  diabetes. Amy Mahoney agrees to continue taking lisinopril 5 mg PO daily #30 and we will refill for 1 month; she agrees to continue taking metformin 500 mg PO daily #30 and we will refill for 1 month. Amy Mahoney agrees to follow up with our clinic in 2 weeks.  Obesity Amy Mahoney is currently in the action stage of change. As such, her goal is to continue with weight loss efforts She has agreed to follow the Category 2 plan Amy Mahoney has been instructed to work up to a goal of 150 minutes of combined cardio and strengthening exercise per week for weight loss and overall health benefits. We discussed the following Behavioral Modification Strategies today: increasing lean protein intake, increasing vegetables and work on meal planning and easy cooking plans, keeping healthy foods in the home, better snacking choices, and planning for success   Amy Mahoney has agreed to follow up with our clinic in 2 weeks. She was informed of the importance of frequent follow up visits to maximize her success with intensive lifestyle modifications for her multiple health conditions.  ALLERGIES: No Known Allergies  MEDICATIONS: Current Outpatient Medications on File Prior to Visit  Medication Sig Dispense Refill  . atorvastatin (LIPITOR) 40 MG tablet TAKE 1 TABLET BY MOUTH  DAILY 90 tablet 1  . Calcium Carbonate-Vitamin D (CALCIUM 500 + D PO) Take 1 tablet by mouth 2 (two) times daily.      . fenofibrate 160 MG tablet TAKE 1 TABLET BY MOUTH  DAILY 90 tablet 1  . fluticasone (FLONASE) 50 MCG/ACT nasal spray Place 2 sprays into both nostrils daily. (Patient not taking: Reported on 07/28/2018) 16 g 6  . loratadine (CLARITIN) 10 MG tablet Take 1 tablet (10 mg total) by mouth daily. (Patient not taking: Reported on 07/28/2018) 30 tablet 11  . Multiple Vitamin (MULTIVITAMIN) tablet Take 1 tablet by mouth daily.      [name] venlafaxine XR (EFFEXOR-XR) 150 MG 24 hr capsule TAKE 1 CAPSULE BY MOUTH  DAILY 90 capsule 1  . vitamin E 400 UNIT capsule Take 400  Units by mouth daily.     No current facility-administered medications on file prior to visit.     PAST MEDICAL HISTORY: Past Medical History:  Diagnosis Date  . Anxiety   . Back pain   . Constipation   . Depression   . Diabetes mellitus without complication ([place])   . Gallbladder problem   . Heart murmur   . Hyperlipidemia   . Hypertension   . Prediabetes   . Sleep apnea    On CPAP  . Spider bite   . Swelling   . Thyroid disease    hypothyroidism    PAST SURGICAL HISTORY: Past Surgical History:  Procedure Laterality Date  . CHOLECYSTECTOMY    . DILATION AND CURETTAGE OF UTERUS    . ESOPHAGOGASTRODUODENOSCOPY (EGD) WITH PROPOFOL N/A 09/05/2017   Procedure: ESOPHAGOGASTRODUODENOSCOPY (EGD) WITH PROPOFOL;  Surgeon: Jerene Bears, MD;  Location: [facility];  Service: Gastroenterology;  Laterality: N/A;  . fibroids scrapping    . TONSILLECTOMY      SOCIAL HISTORY: Social History   Tobacco Use  . Smoking status: Former Smoker    Packs/day: 0.50  Years: 3.00    Pack years: 1.50    Types: Cigarettes    Quit date: 08/10/1990    Years since quitting: 28.0  . Smokeless tobacco: Never Used  Substance Use Topics  . Alcohol use: Yes    Comment: rarely  . Drug use: No    FAMILY HISTORY: Family History  Problem Relation Age of Onset  . Diabetes Mother   . Thyroid disease Mother   . Depression Mother   . Depression Unknown   . Hyperlipidemia Unknown   . Arthritis Unknown   . Diabetes Maternal Aunt   . Breast cancer Paternal Grandmother   . Lung cancer Maternal Aunt        Nonsmoker  . Cancer Maternal Aunt        breast  . Breast cancer Maternal Aunt   . Cancer Maternal Aunt        lung(nonsmoker)  . Colon cancer Neg Hx   . Pancreatic cancer Neg Hx   . Stomach cancer Neg Hx   . Esophageal cancer Neg Hx   . Rectal cancer Neg Hx     ROS: Review of Systems  Constitutional: Positive for malaise/fatigue. Negative for weight loss.  Cardiovascular: Negative  for palpitations.  Gastrointestinal: Negative for nausea and vomiting.  Musculoskeletal:       Negative muscle weakness  Endo/Heme/Allergies:       Negative hot/cold intolerance Negative hypoglycemia    PHYSICAL EXAM: Pt in no acute distress  RECENT LABS AND TESTS: BMET    Component Value Date/Time   NA 144 04/14/2018 1019   K 5.1 04/14/2018 1019   CL 106 04/14/2018 1019   CO2 23 04/14/2018 1019   GLUCOSE 126 (H) 04/14/2018 1019   GLUCOSE 150 (H) 10/17/2017 1416   BUN 23 04/14/2018 1019   CREATININE 0.91 04/14/2018 1019   CALCIUM 9.8 04/14/2018 1019   GFRNONAA 65 04/14/2018 1019   GFRAA 75 04/14/2018 1019   Lab Results  Component Value Date   HGBA1C 6.0 (H) 04/14/2018   HGBA1C 6.9 (H) 01/01/2018   HGBA1C 7.4 (H) 10/17/2017   HGBA1C 6.6 (H) 05/24/2017   HGBA1C 6.7 (H) 06/14/2016   Lab Results  Component Value Date   INSULIN 17.1 04/14/2018   INSULIN 14.9 01/01/2018   CBC    Component Value Date/Time   WBC 6.2 01/01/2018 1136   WBC 6.1 10/17/2017 1416   RBC 4.21 01/01/2018 1136   RBC 4.24 10/17/2017 1416   HGB 12.5 01/01/2018 1136   HCT 38.0 01/01/2018 1136   PLT 305 01/01/2018 1136   MCV 90 01/01/2018 1136   MCH 29.7 01/01/2018 1136   MCH 30.6 12/26/2013 1540   MCHC 32.9 01/01/2018 1136   MCHC 33.4 10/17/2017 1416   RDW 12.5 01/01/2018 1136   LYMPHSABS 2.3 01/01/2018 1136   MONOABS 0.5 10/17/2017 1416   EOSABS 0.4 01/01/2018 1136   BASOSABS 0.1 01/01/2018 1136   Iron/TIBC/Ferritin/ %Sat No results found for: IRON, TIBC, FERRITIN, IRONPCTSAT Lipid Panel     Component Value Date/Time   CHOL 115 04/14/2018 1019   TRIG 75 04/14/2018 1019   HDL 40 04/14/2018 1019   CHOLHDL 3.4 01/01/2018 1136   CHOLHDL 3 10/17/2017 1416   VLDL 33.0 10/17/2017 1416   LDLCALC 60 04/14/2018 1019   LDLDIRECT 193.3 11/06/2012 0926   Hepatic Function Panel     Component Value Date/Time   PROT 6.5 04/14/2018 1019   ALBUMIN 4.4 04/14/2018 1019   AST 29 04/14/2018  1019  ALT 23 04/14/2018 1019   ALKPHOS 45 04/14/2018 1019   BILITOT 0.4 04/14/2018 1019   BILIDIR 0.1 08/09/2014 1628      Component Value Date/Time   TSH 2.040 04/14/2018 1019   TSH 6.010 (H) 01/01/2018 1136   TSH 2.21 10/17/2017 1416      I, Trixie Dredge, am acting as transcriptionist for Ilene Qua, MD  I have reviewed the above documentation for accuracy and completeness, and I agree with the above. - Ilene Qua, MD

## 2018-08-08 ENCOUNTER — Other Ambulatory Visit: Payer: Self-pay | Admitting: Family Medicine

## 2018-08-08 DIAGNOSIS — E785 Hyperlipidemia, unspecified: Secondary | ICD-10-CM

## 2018-08-15 ENCOUNTER — Other Ambulatory Visit: Payer: Self-pay | Admitting: Family Medicine

## 2018-08-15 DIAGNOSIS — E785 Hyperlipidemia, unspecified: Secondary | ICD-10-CM

## 2018-08-18 ENCOUNTER — Telehealth (INDEPENDENT_AMBULATORY_CARE_PROVIDER_SITE_OTHER): Payer: Medicare Other | Admitting: Family Medicine

## 2018-08-18 ENCOUNTER — Other Ambulatory Visit: Payer: Self-pay

## 2018-08-18 ENCOUNTER — Encounter (INDEPENDENT_AMBULATORY_CARE_PROVIDER_SITE_OTHER): Payer: Self-pay | Admitting: Family Medicine

## 2018-08-18 DIAGNOSIS — E038 Other specified hypothyroidism: Secondary | ICD-10-CM

## 2018-08-18 DIAGNOSIS — Z6841 Body Mass Index (BMI) 40.0 and over, adult: Secondary | ICD-10-CM

## 2018-08-18 DIAGNOSIS — E1165 Type 2 diabetes mellitus with hyperglycemia: Secondary | ICD-10-CM

## 2018-08-19 NOTE — Progress Notes (Signed)
Office: [phone]  /  Fax: [phone] TeleHealth Visit:  Amy Mahoney has verbally consented to this TeleHealth visit today. The patient is located at home, the provider is located at the [facility] office. The participants in this visit include the listed provider and patient. The visit was conducted today via webex.  HPI:   Chief Complaint: OBESITY Amy Mahoney is here to discuss her progress with her obesity treatment plan. She is on the Category 2 plan and is following her eating plan approximately 80 % of the time. She states she is working out with her mother for 30-45 minutes 4 times per week. Amy Mahoney has had a very busy 2 weeks. She had to be recertified for her mother's physical therapy, occupational therapy, and speech. She found that she struggled to continue and maintain meal plan with increase in demands of schedule. She is occasionally not hungry secondary to fatigue. She states her yesterday her weight was 241 lbs. We were unable to weigh the patient today for this TeleHealth visit. She feels as if she has maintained her weight since her last visit. She has lost 19 lbs since starting treatment with [name].  Diabetes II with Hyperglycemia Amy Mahoney has a diagnosis of diabetes type II. Smt. is not on insulin and she states she is not checking her blood sugars at home. She denies GI side effects of metformin. Last A1c was 6.0. She denies hypoglycemia. She has been working on intensive lifestyle modifications including diet, exercise, and weight loss to help control her blood glucose levels.  Hypothyroidism Amy Mahoney has a diagnosis of hypothyroidism. She is currently on levothyroxine. She denies hot or cold intolerance or palpitations, but does admit to ongoing fatigue.  ASSESSMENT AND PLAN:  Type 2 diabetes mellitus with hyperglycemia, without long-term current use of insulin (HCC)  Other specified hypothyroidism  Class 3 severe obesity with serious comorbidity and body mass  index (BMI) of 40.0 to 44.9 in adult, unspecified obesity type ([place])  PLAN:  Diabetes II with Hyperglycemia Amy Mahoney has been given extensive diabetes education by myself today including ideal fasting and post-prandial blood glucose readings, individual ideal Hgb A1c goals and hypoglycemia prevention. We discussed the importance of good blood sugar control to decrease the likelihood of diabetic complications such as nephropathy, neuropathy, limb loss, blindness, coronary artery disease, and death. We discussed the importance of intensive lifestyle modification including diet, exercise and weight loss as the first line treatment for diabetes. Amiley agrees to continue taking metformin, and we will repeat labs at her first in office appointment. Amy Mahoney agrees to follow up with our clinic in 2 weeks.  Hypothyroidism Amy Mahoney was informed of the importance of good thyroid control to help with weight loss efforts. She was also informed that supertheraputic thyroid levels are dangerous and will not improve weight loss results. We will repeat TSH at her first in office appointment. Amy Mahoney agrees to follow up with our clinic in 2 weeks.  Obesity Amy Mahoney is currently in the action stage of change. As such, her goal is to continue with weight loss efforts She has agreed to follow the Category 2 plan Amy Mahoney has been instructed to work up to a goal of 150 minutes of combined cardio and strengthening exercise per week for weight loss and overall health benefits. We discussed the following Behavioral Modification Strategies today: increasing lean protein intake, increasing vegetables and work on meal planning and easy cooking plans, keeping healthy foods in the home, better snacking choices, and planning for  success   Amy Mahoney has agreed to follow up with our clinic in 2 weeks. She was informed of the importance of frequent follow up visits to maximize her success with intensive lifestyle modifications for her multiple health  conditions.  ALLERGIES: No Known Allergies  MEDICATIONS: Current Outpatient Medications on File Prior to Visit  Medication Sig Dispense Refill   atorvastatin (LIPITOR) 40 MG tablet Take 1 tablet (40 mg total) by mouth daily. 90 tablet 0   Calcium Carbonate-Vitamin D (CALCIUM 500 + D PO) Take 1 tablet by mouth 2 (two) times daily.       fenofibrate 160 MG tablet TAKE 1 TABLET BY MOUTH  DAILY 90 tablet 1   levothyroxine (SYNTHROID) 112 MCG tablet Take 1 tablet (112 mcg total) by mouth daily before breakfast. 30 tablet 0   lisinopril (ZESTRIL) 5 MG tablet Take 1 tablet (5 mg total) by mouth daily. 90 tablet 0   metFORMIN (GLUCOPHAGE) 500 MG tablet Take 1 tablet (500 mg total) by mouth daily with breakfast. 90 tablet 0   Multiple Vitamin (MULTIVITAMIN) tablet Take 1 tablet by mouth daily.       venlafaxine XR (EFFEXOR-XR) 150 MG 24 hr capsule TAKE 1 CAPSULE BY MOUTH  DAILY 90 capsule 1   Vitamin D, Ergocalciferol, (DRISDOL) 1.25 MG (50000 UT) CAPS capsule Take 1 capsule (50,000 Units total) by mouth every 7 (seven) days. 12 capsule 0   vitamin E 400 UNIT capsule Take 400 Units by mouth daily.     fluticasone (FLONASE) 50 MCG/ACT nasal spray Place 2 sprays into both nostrils daily. (Patient not taking: Reported on 08/18/2018) 16 g 6   loratadine (CLARITIN) 10 MG tablet Take 1 tablet (10 mg total) by mouth daily. (Patient not taking: Reported on 08/18/2018) 30 tablet 11   No current facility-administered medications on file prior to visit.     PAST MEDICAL HISTORY: Past Medical History:  Diagnosis Date   Anxiety    Back pain    Constipation    Depression    Diabetes mellitus without complication ([place])    Gallbladder problem    Heart murmur    Hyperlipidemia    Hypertension    Prediabetes    Sleep apnea    On CPAP   Spider bite    Swelling    Thyroid disease    hypothyroidism    PAST SURGICAL HISTORY: Past Surgical History:  Procedure Laterality Date     CHOLECYSTECTOMY     DILATION AND CURETTAGE OF UTERUS     ESOPHAGOGASTRODUODENOSCOPY (EGD) WITH PROPOFOL N/A 09/05/2017   Procedure: ESOPHAGOGASTRODUODENOSCOPY (EGD) WITH PROPOFOL;  Surgeon: Jerene Bears, MD;  Location: [facility];  Service: Gastroenterology;  Laterality: N/A;   fibroids scrapping     TONSILLECTOMY      SOCIAL HISTORY: Social History   Tobacco Use   Smoking status: Former Smoker    Packs/day: 0.50    Years: 3.00    Pack years: 1.50    Types: Cigarettes    Quit date: 08/10/1990    Years since quitting: 28.0   Smokeless tobacco: Never Used  Substance Use Topics   Alcohol use: Yes    Comment: rarely   Drug use: No    FAMILY HISTORY: Family History  Problem Relation Age of Onset   Diabetes Mother    Thyroid disease Mother    Depression Mother    Depression Unknown    Hyperlipidemia Unknown    Arthritis Unknown    Diabetes Maternal Aunt  Breast cancer Paternal Grandmother    Lung cancer Maternal Aunt        Nonsmoker   Cancer Maternal Aunt        breast   Breast cancer Maternal Aunt    Cancer Maternal Aunt        lung(nonsmoker)   Colon cancer Neg Hx    Pancreatic cancer Neg Hx    Stomach cancer Neg Hx    Esophageal cancer Neg Hx    Rectal cancer Neg Hx     ROS: Review of Systems  Constitutional: Positive for malaise/fatigue. Negative for weight loss.  Cardiovascular: Negative for palpitations.  Endo/Heme/Allergies:       Negative hypoglycemia Negative hot/cold intolerance    PHYSICAL EXAM: Pt in no acute distress  RECENT LABS AND TESTS: BMET    Component Value Date/Time   NA 144 04/14/2018 1019   K 5.1 04/14/2018 1019   CL 106 04/14/2018 1019   CO2 23 04/14/2018 1019   GLUCOSE 126 (H) 04/14/2018 1019   GLUCOSE 150 (H) 10/17/2017 1416   BUN 23 04/14/2018 1019   CREATININE 0.91 04/14/2018 1019   CALCIUM 9.8 04/14/2018 1019   GFRNONAA 65 04/14/2018 1019   GFRAA 75 04/14/2018 1019   Lab Results   Component Value Date   HGBA1C 6.0 (H) 04/14/2018   HGBA1C 6.9 (H) 01/01/2018   HGBA1C 7.4 (H) 10/17/2017   HGBA1C 6.6 (H) 05/24/2017   HGBA1C 6.7 (H) 06/14/2016   Lab Results  Component Value Date   INSULIN 17.1 04/14/2018   INSULIN 14.9 01/01/2018   CBC    Component Value Date/Time   WBC 6.2 01/01/2018 1136   WBC 6.1 10/17/2017 1416   RBC 4.21 01/01/2018 1136   RBC 4.24 10/17/2017 1416   HGB 12.5 01/01/2018 1136   HCT 38.0 01/01/2018 1136   PLT 305 01/01/2018 1136   MCV 90 01/01/2018 1136   MCH 29.7 01/01/2018 1136   MCH 30.6 12/26/2013 1540   MCHC 32.9 01/01/2018 1136   MCHC 33.4 10/17/2017 1416   RDW 12.5 01/01/2018 1136   LYMPHSABS 2.3 01/01/2018 1136   MONOABS 0.5 10/17/2017 1416   EOSABS 0.4 01/01/2018 1136   BASOSABS 0.1 01/01/2018 1136   Iron/TIBC/Ferritin/ %Sat No results found for: IRON, TIBC, FERRITIN, IRONPCTSAT Lipid Panel     Component Value Date/Time   CHOL 115 04/14/2018 1019   TRIG 75 04/14/2018 1019   HDL 40 04/14/2018 1019   CHOLHDL 3.4 01/01/2018 1136   CHOLHDL 3 10/17/2017 1416   VLDL 33.0 10/17/2017 1416   LDLCALC 60 04/14/2018 1019   LDLDIRECT 193.3 11/06/2012 0926   Hepatic Function Panel     Component Value Date/Time   PROT 6.5 04/14/2018 1019   ALBUMIN 4.4 04/14/2018 1019   AST 29 04/14/2018 1019   ALT 23 04/14/2018 1019   ALKPHOS 45 04/14/2018 1019   BILITOT 0.4 04/14/2018 1019   BILIDIR 0.1 08/09/2014 1628      Component Value Date/Time   TSH 2.040 04/14/2018 1019   TSH 6.010 (H) 01/01/2018 1136   TSH 2.21 10/17/2017 1416      I, Trixie Dredge, am acting as transcriptionist for Ilene Qua, MD  I have reviewed the above documentation for accuracy and completeness, and I agree with the above. - Ilene Qua, MD

## 2018-08-29 ENCOUNTER — Encounter (INDEPENDENT_AMBULATORY_CARE_PROVIDER_SITE_OTHER): Payer: Self-pay | Admitting: Family Medicine

## 2018-08-29 ENCOUNTER — Other Ambulatory Visit (INDEPENDENT_AMBULATORY_CARE_PROVIDER_SITE_OTHER): Payer: Self-pay | Admitting: Family Medicine

## 2018-08-29 DIAGNOSIS — E038 Other specified hypothyroidism: Secondary | ICD-10-CM

## 2018-09-01 ENCOUNTER — Ambulatory Visit (INDEPENDENT_AMBULATORY_CARE_PROVIDER_SITE_OTHER): Payer: Medicare Other | Admitting: Family Medicine

## 2018-09-09 NOTE — Assessment & Plan Note (Signed)
Weight loss remains a goal, but insufficiently motivated for real change at this time. Consider Bariatric referral.

## 2018-09-09 NOTE — Assessment & Plan Note (Signed)
Benefits from CPAP with better sleep. Plan- continue auto 10-20

## 2018-09-15 ENCOUNTER — Other Ambulatory Visit (INDEPENDENT_AMBULATORY_CARE_PROVIDER_SITE_OTHER): Payer: Self-pay | Admitting: Family Medicine

## 2018-09-15 DIAGNOSIS — E038 Other specified hypothyroidism: Secondary | ICD-10-CM

## 2018-09-17 ENCOUNTER — Other Ambulatory Visit: Payer: Self-pay

## 2018-09-17 ENCOUNTER — Encounter (INDEPENDENT_AMBULATORY_CARE_PROVIDER_SITE_OTHER): Payer: Self-pay | Admitting: Family Medicine

## 2018-09-17 ENCOUNTER — Ambulatory Visit (INDEPENDENT_AMBULATORY_CARE_PROVIDER_SITE_OTHER): Payer: Medicare Other | Admitting: Family Medicine

## 2018-09-17 VITALS — BP 117/74 | HR 70 | Temp 98.1°F | Ht 65.0 in | Wt 248.0 lb

## 2018-09-17 DIAGNOSIS — E1165 Type 2 diabetes mellitus with hyperglycemia: Secondary | ICD-10-CM

## 2018-09-17 DIAGNOSIS — Z6841 Body Mass Index (BMI) 40.0 and over, adult: Secondary | ICD-10-CM

## 2018-09-17 DIAGNOSIS — E038 Other specified hypothyroidism: Secondary | ICD-10-CM

## 2018-09-17 MED ORDER — LEVOTHYROXINE SODIUM 112 MCG PO TABS: 112.0000 ug | ORAL_TABLET | Freq: Every day | ORAL | 0 refills | Status: DC

## 2018-09-17 NOTE — Progress Notes (Signed)
Office: [phone]  /  Fax: [phone]   HPI:   Chief Complaint: OBESITY Amy Mahoney is here to discuss her progress with her obesity treatment plan. She is on the Category 2 plan and is following her eating plan approximately 0% of the time. She states she is working with a Physiological scientist 30-40 minutes 3-4 times per week. Amy Mahoney returns to the clinic after a small break from the clinic secondary to her mother's death. She feels ready to recommit and would like to get back on track. She voices her father is also going to eat on her plan as well. Her weight is 248 lb (112.5 kg) today and has had a weight gain of 1 lb since her last in-office visit 5 months ago. She has lost 16 lbs since starting treatment with [name].  Hypothyroidism Amy Mahoney has a diagnosis of hypothyroidism. She is on Synthroid. She denies hot or cold intolerance or palpitations, but does admit to fatigue.  Diabetes II with Hyperglycemia, not on Insulin Amy Mahoney has a diagnosis of diabetes type II and is on metformin and reports no GI side effects. Amy Mahoney does not report checking her blood sugars. Last A1c was 6.0 on 04/14/2018. She has been working on intensive lifestyle modifications including diet, exercise, and weight loss to help control her blood glucose levels.  ASSESSMENT AND PLAN:  Other specified hypothyroidism - Plan: levothyroxine (SYNTHROID) 112 MCG tablet  Type 2 diabetes mellitus with hyperglycemia, without long-term current use of insulin (HCC)  Class 3 severe obesity with serious comorbidity and body mass index (BMI) of 40.0 to 44.9 in adult, unspecified obesity type ([place])  PLAN:  Hypothyroidism Amy Mahoney was informed of the importance of good thyroid control to help with weight loss efforts. She was also informed that supertheraputic thyroid levels are dangerous and will not improve weight loss results. Amy Mahoney was given a refill on her Synthroid 112 mcg PO QAM #90 with 0 refills. She agrees to follow-up with our clinic in 2  weeks.  Diabetes II with Hyperglycemia, not on Insulin Amy Mahoney has been given extensive diabetes education by myself today including ideal fasting and post-prandial blood glucose readings, individual ideal HgA1c goals  and hypoglycemia prevention. We discussed the importance of good blood sugar control to decrease the likelihood of diabetic complications such as nephropathy, neuropathy, limb loss, blindness, coronary artery disease, and death. We discussed the importance of intensive lifestyle modification including diet, exercise and weight loss as the first line treatment for diabetes. [name] agrees to continue metformin and will follow-up at the agreed upon time.  Obesity Amy Mahoney is currently in the action stage of change. As such, her goal is to continue with weight loss efforts. She has agreed to follow the Category 2 plan. Amy Mahoney has been instructed to work up to a goal of 150 minutes of combined cardio and strengthening exercise per week for weight loss and overall health benefits. We discussed the following Behavioral Modification Strategies today: increasing lean protein intake, increasing vegetables, work on meal planning and easy cooking plans, keeping healthy foods in the home, and planning for success.  Amy Mahoney has agreed to follow-up with our clinic in 2 weeks (fasting). She was informed of the importance of frequent follow-up visits to maximize her success with intensive lifestyle modifications for her multiple health conditions.  ALLERGIES: No Known Allergies  MEDICATIONS: Current Outpatient Medications on File Prior to Visit  Medication Sig Dispense Refill  . atorvastatin (LIPITOR) 40 MG tablet Take 1 tablet (40 mg total) by mouth  daily. 90 tablet 0  . Calcium Carbonate-Vitamin D (CALCIUM 500 + D PO) Take 1 tablet by mouth 2 (two) times daily.      . fenofibrate 160 MG tablet TAKE 1 TABLET BY MOUTH  DAILY 90 tablet 1  . fluticasone (FLONASE) 50 MCG/ACT nasal spray Place 2 sprays into both  nostrils daily. (Patient not taking: Reported on 08/18/2018) 16 g 6  . lisinopril (ZESTRIL) 5 MG tablet Take 1 tablet (5 mg total) by mouth daily. 90 tablet 0  . loratadine (CLARITIN) 10 MG tablet Take 1 tablet (10 mg total) by mouth daily. (Patient not taking: Reported on 08/18/2018) 30 tablet 11  . metFORMIN (GLUCOPHAGE) 500 MG tablet Take 1 tablet (500 mg total) by mouth daily with breakfast. 90 tablet 0  . Multiple Vitamin (MULTIVITAMIN) tablet Take 1 tablet by mouth daily.      [name] venlafaxine XR (EFFEXOR-XR) 150 MG 24 hr capsule TAKE 1 CAPSULE BY MOUTH  DAILY 90 capsule 1  . Vitamin D, Ergocalciferol, (DRISDOL) 1.25 MG (50000 UT) CAPS capsule Take 1 capsule (50,000 Units total) by mouth every 7 (seven) days. 12 capsule 0  . vitamin E 400 UNIT capsule Take 400 Units by mouth daily.     No current facility-administered medications on file prior to visit.     PAST MEDICAL HISTORY: Past Medical History:  Diagnosis Date  . Anxiety   . Back pain   . Constipation   . Depression   . Diabetes mellitus without complication ([place])   . Gallbladder problem   . Heart murmur   . Hyperlipidemia   . Hypertension   . Prediabetes   . Sleep apnea    On CPAP  . Spider bite   . Swelling   . Thyroid disease    hypothyroidism    PAST SURGICAL HISTORY: Past Surgical History:  Procedure Laterality Date  . CHOLECYSTECTOMY    . DILATION AND CURETTAGE OF UTERUS    . ESOPHAGOGASTRODUODENOSCOPY (EGD) WITH PROPOFOL N/A 09/05/2017   Procedure: ESOPHAGOGASTRODUODENOSCOPY (EGD) WITH PROPOFOL;  Surgeon: Jerene Bears, MD;  Location: [facility];  Service: Gastroenterology;  Laterality: N/A;  . fibroids scrapping    . TONSILLECTOMY      SOCIAL HISTORY: Social History   Tobacco Use  . Smoking status: Former Smoker    Packs/day: 0.50    Years: 3.00    Pack years: 1.50    Types: Cigarettes    Quit date: 08/10/1990    Years since quitting: 28.1  . Smokeless tobacco: Never Used  Substance Use Topics   . Alcohol use: Yes    Comment: rarely  . Drug use: No    FAMILY HISTORY: Family History  Problem Relation Age of Onset  . Diabetes Mother   . Thyroid disease Mother   . Depression Mother   . Depression Unknown   . Hyperlipidemia Unknown   . Arthritis Unknown   . Diabetes Maternal Aunt   . Breast cancer Paternal Grandmother   . Lung cancer Maternal Aunt        Nonsmoker  . Cancer Maternal Aunt        breast  . Breast cancer Maternal Aunt   . Cancer Maternal Aunt        lung(nonsmoker)  . Colon cancer Neg Hx   . Pancreatic cancer Neg Hx   . Stomach cancer Neg Hx   . Esophageal cancer Neg Hx   . Rectal cancer Neg Hx    ROS: Review of Systems  Constitutional: Positive for malaise/fatigue.  Cardiovascular: Negative for palpitations.  Endo/Heme/Allergies:       Negative for heat/cold intolerance.   PHYSICAL EXAM: Blood pressure 117/74, pulse 70, temperature 98.1 F (36.7 C), temperature source Oral, height 5\' 5"  (1.651 m), weight 248 lb (112.5 kg), SpO2 97 %. Body mass index is 41.27 kg/m. Physical Exam Vitals signs reviewed.  Constitutional:      Appearance: Normal appearance. She is obese.  Cardiovascular:     Rate and Rhythm: Normal rate.     Pulses: Normal pulses.  Pulmonary:     Effort: Pulmonary effort is normal.     Breath sounds: Normal breath sounds.  Musculoskeletal: Normal range of motion.  Skin:    General: Skin is warm and dry.  Neurological:     Mental Status: She is alert and oriented to person, place, and time.  Psychiatric:        Behavior: Behavior normal.   RECENT LABS AND TESTS: BMET    Component Value Date/Time   NA 144 04/14/2018 1019   K 5.1 04/14/2018 1019   CL 106 04/14/2018 1019   CO2 23 04/14/2018 1019   GLUCOSE 126 (H) 04/14/2018 1019   GLUCOSE 150 (H) 10/17/2017 1416   BUN 23 04/14/2018 1019   CREATININE 0.91 04/14/2018 1019   CALCIUM 9.8 04/14/2018 1019   GFRNONAA 65 04/14/2018 1019   GFRAA 75 04/14/2018 1019   Lab  Results  Component Value Date   HGBA1C 6.0 (H) 04/14/2018   HGBA1C 6.9 (H) 01/01/2018   HGBA1C 7.4 (H) 10/17/2017   HGBA1C 6.6 (H) 05/24/2017   HGBA1C 6.7 (H) 06/14/2016   Lab Results  Component Value Date   INSULIN 17.1 04/14/2018   INSULIN 14.9 01/01/2018   CBC    Component Value Date/Time   WBC 6.2 01/01/2018 1136   WBC 6.1 10/17/2017 1416   RBC 4.21 01/01/2018 1136   RBC 4.24 10/17/2017 1416   HGB 12.5 01/01/2018 1136   HCT 38.0 01/01/2018 1136   PLT 305 01/01/2018 1136   MCV 90 01/01/2018 1136   MCH 29.7 01/01/2018 1136   MCH 30.6 12/26/2013 1540   MCHC 32.9 01/01/2018 1136   MCHC 33.4 10/17/2017 1416   RDW 12.5 01/01/2018 1136   LYMPHSABS 2.3 01/01/2018 1136   MONOABS 0.5 10/17/2017 1416   EOSABS 0.4 01/01/2018 1136   BASOSABS 0.1 01/01/2018 1136   Iron/TIBC/Ferritin/ %Sat No results found for: IRON, TIBC, FERRITIN, IRONPCTSAT Lipid Panel     Component Value Date/Time   CHOL 115 04/14/2018 1019   TRIG 75 04/14/2018 1019   HDL 40 04/14/2018 1019   CHOLHDL 3.4 01/01/2018 1136   CHOLHDL 3 10/17/2017 1416   VLDL 33.0 10/17/2017 1416   LDLCALC 60 04/14/2018 1019   LDLDIRECT 193.3 11/06/2012 0926   Hepatic Function Panel     Component Value Date/Time   PROT 6.5 04/14/2018 1019   ALBUMIN 4.4 04/14/2018 1019   AST 29 04/14/2018 1019   ALT 23 04/14/2018 1019   ALKPHOS 45 04/14/2018 1019   BILITOT 0.4 04/14/2018 1019   BILIDIR 0.1 08/09/2014 1628      Component Value Date/Time   TSH 2.040 04/14/2018 1019   TSH 6.010 (H) 01/01/2018 1136   TSH 2.21 10/17/2017 1416   Results for LARKIN, ALFRED (MRN [id number]) as of 09/17/2018 12:31  Ref. Range 04/14/2018 10:19  Vitamin D, 25-Hydroxy Latest Ref Range: 30.0 - 100.0 ng/mL 54.6   OBESITY BEHAVIORAL INTERVENTION VISIT  Today's visit was #14  Starting weight: 264 lbs Starting date: 01/01/2018 Today's weight: 248 lbs  Today's date: 09/17/2018 Total lbs lost to date: 16 At least 15 minutes were spent on  discussing the following behavioral intervention visit.    09/17/2018  Height 5\' 5"  (1.651 m)  Weight 248 lb (112.5 kg)  BMI (Calculated) 41.27  BLOOD PRESSURE - SYSTOLIC 756  BLOOD PRESSURE - DIASTOLIC 74   Body Fat % 43.3 %   ASK: We discussed the diagnosis of obesity with Amy Mahoney today and Amy Mahoney agreed to give [name] permission to discuss obesity behavioral modification therapy today.  ASSESS: Alliah has the diagnosis of obesity and her BMI today is 41.4. Madalena is in the action stage of change.   ADVISE: Marialice was educated on the multiple health risks of obesity as well as the benefit of weight loss to improve her health. She was advised of the need for long term treatment and the importance of lifestyle modifications to improve her current health and to decrease her risk of future health problems.  AGREE: Multiple dietary modification options and treatment options were discussed and  Amy Mahoney agreed to follow the recommendations documented in the above note.  ARRANGE: Amy Mahoney was educated on the importance of frequent visits to treat obesity as outlined per CMS and USPSTF guidelines and agreed to schedule her next follow up appointment today.  I, Michaelene Song, am acting as transcriptionist for Ilene Qua, MD  I have reviewed the above documentation for accuracy and completeness, and I agree with the above. - Ilene Qua, MD

## 2018-10-06 ENCOUNTER — Ambulatory Visit (INDEPENDENT_AMBULATORY_CARE_PROVIDER_SITE_OTHER): Payer: Medicare Other | Admitting: Family Medicine

## 2018-10-06 ENCOUNTER — Other Ambulatory Visit: Payer: Self-pay

## 2018-10-06 ENCOUNTER — Encounter (INDEPENDENT_AMBULATORY_CARE_PROVIDER_SITE_OTHER): Payer: Self-pay | Admitting: Family Medicine

## 2018-10-06 VITALS — BP 126/81 | HR 70 | Temp 98.1°F | Ht 65.0 in | Wt 247.0 lb

## 2018-10-06 DIAGNOSIS — Z6841 Body Mass Index (BMI) 40.0 and over, adult: Secondary | ICD-10-CM

## 2018-10-06 DIAGNOSIS — E1165 Type 2 diabetes mellitus with hyperglycemia: Secondary | ICD-10-CM | POA: Diagnosis not present

## 2018-10-06 DIAGNOSIS — Z9189 Other specified personal risk factors, not elsewhere classified: Secondary | ICD-10-CM | POA: Diagnosis not present

## 2018-10-06 DIAGNOSIS — E038 Other specified hypothyroidism: Secondary | ICD-10-CM

## 2018-10-06 DIAGNOSIS — E559 Vitamin D deficiency, unspecified: Secondary | ICD-10-CM | POA: Diagnosis not present

## 2018-10-07 LAB — COMPREHENSIVE METABOLIC PANEL
ALT: 19 IU/L (ref 0–32)
AST: 28 IU/L (ref 0–40)
Albumin/Globulin Ratio: 1.8 (ref 1.2–2.2)
Albumin: 4.3 g/dL (ref 3.8–4.8)
Alkaline Phosphatase: 44 IU/L (ref 39–117)
BUN/Creatinine Ratio: 30 — ABNORMAL HIGH (ref 12–28)
BUN: 26 mg/dL (ref 8–27)
Bilirubin Total: 0.3 mg/dL (ref 0.0–1.2)
CO2: 26 mmol/L (ref 20–29)
Calcium: 9.7 mg/dL (ref 8.7–10.3)
Chloride: 103 mmol/L (ref 96–106)
Creatinine, Ser: 0.88 mg/dL (ref 0.57–1.00)
GFR calc Af Amer: 78 mL/min/{1.73_m2} (ref >59)
GFR calc non Af Amer: 68 mL/min/{1.73_m2} (ref >59)
Globulin, Total: 2.4 g/dL (ref 1.5–4.5)
Glucose: 124 mg/dL — ABNORMAL HIGH (ref 65–99)
Potassium: 5.1 mmol/L (ref 3.5–5.2)
Sodium: 142 mmol/L (ref 134–144)
Total Protein: 6.7 g/dL (ref 6.0–8.5)

## 2018-10-07 LAB — HEMOGLOBIN A1C
Est. average glucose Bld gHb Est-mCnc: 131 mg/dL
Hgb A1c MFr Bld: 6.2 % — ABNORMAL HIGH (ref 4.8–5.6)

## 2018-10-07 LAB — TSH: TSH: 2.24 u[IU]/mL (ref 0.450–4.500)

## 2018-10-07 LAB — VITAMIN B12: Vitamin B-12: 724 pg/mL (ref 232–1245)

## 2018-10-07 LAB — VITAMIN D 25 HYDROXY (VIT D DEFICIENCY, FRACTURES): Vit D, 25-Hydroxy: 43 ng/mL (ref 30.0–100.0)

## 2018-10-07 LAB — INSULIN, RANDOM: INSULIN: 12.6 u[IU]/mL (ref 2.6–24.9)

## 2018-10-07 NOTE — Progress Notes (Signed)
Office: [phone]  /  Fax: [phone]   HPI:   Chief Complaint: OBESITY Amy Mahoney is here to discuss her progress with her obesity treatment plan. She is on the  follow the Category 2 plan and is following her eating plan approximately 80 % of the time. She states she is exercising by walking with trainer for 40 minutes 5 times per week. Amy Mahoney has a few trips scheduled in the next few months. Amy Mahoney has been better but pt was doing some emotional snacking over the past few weeks. She does mention this is somewhat improved from prior weeks.  Her weight is 247 lb (112 kg) today and has had a weight loss of 1 pounds over a period of 2 weeks since her last visit. She has lost 17 lbs since starting treatment with [name].  Diabetes II Amy Mahoney has a diagnosis of diabetes type II. Amy Mahoney denies any hypoglycemic episodes. Last A1c was 6.2.  She has been working on intensive lifestyle modifications including diet, exercise, and weight loss to help control her blood glucose levels. She denies any GI side effects of Metformin.   Vitamin D deficiency Amy Mahoney has a diagnosis of vitamin D deficiency. She is currently taking vit D and denies nausea, vomiting or muscle weakness. She reports fatigue.   Hypothyroid Amy Mahoney has a diagnosis of hypothyroidism. She is on levothyroxine. She denies hot or cold intolerance or palpitations, but does admit to ongoing fatigue.  At risk for osteopenia and osteoporosis Amy Mahoney is at higher risk of osteopenia and osteoporosis due to vitamin D deficiency.   ASSESSMENT AND PLAN:  Type 2 diabetes mellitus with hyperglycemia, without long-term current use of insulin ([place]) - Plan: Comprehensive metabolic panel, Hemoglobin A1c, Insulin, random  Vitamin D deficiency - Plan: VITAMIN D 25 Hydroxy (Vit-D Deficiency, Fractures), Vitamin B12  Other specified hypothyroidism  At risk for osteoporosis  Class 3 severe obesity with serious comorbidity and body mass index (BMI) of 40.0 to 44.9 in  adult, unspecified obesity type ([place])  PLAN: Diabetes II Amy Mahoney has been given extensive diabetes education by myself today including ideal fasting and post-prandial blood glucose readings, individual ideal HgA1c goals  and hypoglycemia prevention. We discussed the importance of good blood sugar control to decrease the likelihood of diabetic complications such as nephropathy, neuropathy, limb loss, blindness, coronary artery disease, and death. We discussed the importance of intensive lifestyle modification including diet, exercise and weight loss as the first line treatment for diabetes. Amy Mahoney agrees to continue her diabetes medications and will follow up at the agreed upon time. We will repeat HgA1c, Insulin, B12, and CMP today.   Vitamin D Deficiency Amy Mahoney was informed that low vitamin D levels contributes to fatigue and are associated with obesity, breast, and colon cancer. She agrees to continue to take prescription Vit D @50 ,000 IU every week and will follow up for routine testing of vitamin D, at least 2-3 times per year. She was informed of the risk of over-replacement of vitamin D and agrees to not increase her dose unless she discusses this with [name] first. We will repeat vitamin D level today.   Hypothyroid Amy Mahoney was informed of the importance of good thyroid control to help with weight loss efforts. She was also informed that supertheraputic thyroid levels are dangerous and will not improve weight loss results. We will repeat TSH today.   At risk for osteopenia and osteoporosis Amy Mahoney was given extended  (15 minutes) osteoporosis prevention counseling today. Amy Mahoney is at risk for osteopenia  and osteoporosis due to her vitamin D deficiency. She was encouraged to take her vitamin D and follow her higher calcium diet and increase strengthening exercise to help strengthen her bones and decrease her risk of osteopenia and osteoporosis.  Obesity Amy Mahoney is currently in the action stage of change. As such,  her goal is to continue with weight loss efforts She has agreed to follow the Category 2 plan Amy Mahoney has been instructed to work up to a goal of 150 minutes of combined cardio and strengthening exercise per week for weight loss and overall health benefits. We discussed the following Behavioral Modification Strategies today:keeping healthy foods in the home, better snacking choices, planning for success,  increasing lean protein intake, increasing vegetables and work on meal planning and easy cooking plans   Amy Mahoney has agreed to follow up with our clinic in 2 weeks. She was informed of the importance of frequent follow up visits to maximize her success with intensive lifestyle modifications for her multiple health conditions.  ALLERGIES: No Known Allergies  MEDICATIONS: Current Outpatient Medications on File Prior to Visit  Medication Sig Dispense Refill  . atorvastatin (LIPITOR) 40 MG tablet Take 1 tablet (40 mg total) by mouth daily. 90 tablet 0  . Calcium Carbonate-Vitamin D (CALCIUM 500 + D PO) Take 1 tablet by mouth 2 (two) times daily.      . fenofibrate 160 MG tablet TAKE 1 TABLET BY MOUTH  DAILY 90 tablet 1  . levothyroxine (SYNTHROID) 112 MCG tablet Take 1 tablet (112 mcg total) by mouth daily before breakfast. 90 tablet 0  . lisinopril (ZESTRIL) 5 MG tablet Take 1 tablet (5 mg total) by mouth daily. 90 tablet 0  . loratadine (CLARITIN) 10 MG tablet Take 1 tablet (10 mg total) by mouth daily. 30 tablet 11  . metFORMIN (GLUCOPHAGE) 500 MG tablet Take 1 tablet (500 mg total) by mouth daily with breakfast. 90 tablet 0  . Multiple Vitamin (MULTIVITAMIN) tablet Take 1 tablet by mouth daily.      [name] venlafaxine XR (EFFEXOR-XR) 150 MG 24 hr capsule TAKE 1 CAPSULE BY MOUTH  DAILY 90 capsule 1  . Vitamin D, Ergocalciferol, (DRISDOL) 1.25 MG (50000 UT) CAPS capsule Take 1 capsule (50,000 Units total) by mouth every 7 (seven) days. 12 capsule 0  . vitamin E 400 UNIT capsule Take 400 Units by mouth  daily.    . fluticasone (FLONASE) 50 MCG/ACT nasal spray Place 2 sprays into both nostrils daily. (Patient not taking: Reported on 10/06/2018) 16 g 6   No current facility-administered medications on file prior to visit.     PAST MEDICAL HISTORY: Past Medical History:  Diagnosis Date  . Anxiety   . Back pain   . Constipation   . Depression   . Diabetes mellitus without complication ([place])   . Gallbladder problem   . Heart murmur   . Hyperlipidemia   . Hypertension   . Prediabetes   . Sleep apnea    On CPAP  . Spider bite   . Swelling   . Thyroid disease    hypothyroidism    PAST SURGICAL HISTORY: Past Surgical History:  Procedure Laterality Date  . CHOLECYSTECTOMY    . DILATION AND CURETTAGE OF UTERUS    . ESOPHAGOGASTRODUODENOSCOPY (EGD) WITH PROPOFOL N/A 09/05/2017   Procedure: ESOPHAGOGASTRODUODENOSCOPY (EGD) WITH PROPOFOL;  Surgeon: Jerene Bears, MD;  Location: [facility];  Service: Gastroenterology;  Laterality: N/A;  . fibroids scrapping    . TONSILLECTOMY  SOCIAL HISTORY: Social History   Tobacco Use  . Smoking status: Former Smoker    Packs/day: 0.50    Years: 3.00    Pack years: 1.50    Types: Cigarettes    Quit date: 08/10/1990    Years since quitting: 28.1  . Smokeless tobacco: Never Used  Substance Use Topics  . Alcohol use: Yes    Comment: rarely  . Drug use: No    FAMILY HISTORY: Family History  Problem Relation Age of Onset  . Diabetes Mother   . Thyroid disease Mother   . Depression Mother   . Depression Unknown   . Hyperlipidemia Unknown   . Arthritis Unknown   . Diabetes Maternal Aunt   . Breast cancer Paternal Grandmother   . Lung cancer Maternal Aunt        Nonsmoker  . Cancer Maternal Aunt        breast  . Breast cancer Maternal Aunt   . Cancer Maternal Aunt        lung(nonsmoker)  . Colon cancer Neg Hx   . Pancreatic cancer Neg Hx   . Stomach cancer Neg Hx   . Esophageal cancer Neg Hx   . Rectal cancer Neg Hx      ROS: Review of Systems  Constitutional: Positive for malaise/fatigue and weight loss.  Cardiovascular: Negative for palpitations.  Gastrointestinal: Negative for nausea and vomiting.  Musculoskeletal:       Negative for muscle weakness  Endo/Heme/Allergies:       Negative for hypoglycemia  Negative for hot/cold intolerance     PHYSICAL EXAM: Blood pressure 126/81, pulse 70, temperature 98.1 F (36.7 C), temperature source Oral, height 5\' 5"  (1.651 m), weight 247 lb (112 kg), SpO2 95 %. Body mass index is 41.1 kg/m. Physical Exam Vitals signs reviewed.  Constitutional:      Appearance: Normal appearance. She is obese.  HENT:     Head: Normocephalic.     Nose: Nose normal.  Neck:     Musculoskeletal: Normal range of motion.  Cardiovascular:     Rate and Rhythm: Normal rate.  Pulmonary:     Effort: Pulmonary effort is normal.  Musculoskeletal: Normal range of motion.  Skin:    General: Skin is warm and dry.  Neurological:     Mental Status: She is alert and oriented to person, place, and time.  Psychiatric:        Mood and Affect: Mood normal.        Behavior: Behavior normal.     RECENT LABS AND TESTS: BMET    Component Value Date/Time   NA 142 10/06/2018 1039   K 5.1 10/06/2018 1039   CL 103 10/06/2018 1039   CO2 26 10/06/2018 1039   GLUCOSE 124 (H) 10/06/2018 1039   GLUCOSE 150 (H) 10/17/2017 1416   BUN 26 10/06/2018 1039   CREATININE 0.88 10/06/2018 1039   CALCIUM 9.7 10/06/2018 1039   GFRNONAA 68 10/06/2018 1039   GFRAA 78 10/06/2018 1039   Lab Results  Component Value Date   HGBA1C 6.2 (H) 10/06/2018   HGBA1C 6.0 (H) 04/14/2018   HGBA1C 6.9 (H) 01/01/2018   HGBA1C 7.4 (H) 10/17/2017   HGBA1C 6.6 (H) 05/24/2017   Lab Results  Component Value Date   INSULIN 12.6 10/06/2018   INSULIN 17.1 04/14/2018   INSULIN 14.9 01/01/2018   CBC    Component Value Date/Time   WBC 6.2 01/01/2018 1136   WBC 6.1 10/17/2017 1416   RBC 4.21  01/01/2018 1136    RBC 4.24 10/17/2017 1416   HGB 12.5 01/01/2018 1136   HCT 38.0 01/01/2018 1136   PLT 305 01/01/2018 1136   MCV 90 01/01/2018 1136   MCH 29.7 01/01/2018 1136   MCH 30.6 12/26/2013 1540   MCHC 32.9 01/01/2018 1136   MCHC 33.4 10/17/2017 1416   RDW 12.5 01/01/2018 1136   LYMPHSABS 2.3 01/01/2018 1136   MONOABS 0.5 10/17/2017 1416   EOSABS 0.4 01/01/2018 1136   BASOSABS 0.1 01/01/2018 1136   Iron/TIBC/Ferritin/ %Sat No results found for: IRON, TIBC, FERRITIN, IRONPCTSAT Lipid Panel     Component Value Date/Time   CHOL 115 04/14/2018 1019   TRIG 75 04/14/2018 1019   HDL 40 04/14/2018 1019   CHOLHDL 3.4 01/01/2018 1136   CHOLHDL 3 10/17/2017 1416   VLDL 33.0 10/17/2017 1416   LDLCALC 60 04/14/2018 1019   LDLDIRECT 193.3 11/06/2012 0926   Hepatic Function Panel     Component Value Date/Time   PROT 6.7 10/06/2018 1039   ALBUMIN 4.3 10/06/2018 1039   AST 28 10/06/2018 1039   ALT 19 10/06/2018 1039   ALKPHOS 44 10/06/2018 1039   BILITOT 0.3 10/06/2018 1039   BILIDIR 0.1 08/09/2014 1628      Component Value Date/Time   TSH 2.240 10/06/2018 1039   TSH 2.040 04/14/2018 1019   TSH 6.010 (H) 01/01/2018 1136     Ref. Range 01/01/2018 11:36  Vitamin D, 25-Hydroxy Latest Ref Range: 30.0 - 100.0 ng/mL 24.0 (L)     OBESITY BEHAVIORAL INTERVENTION VISIT  Today's visit was # 17   Starting weight: 264 lbs Starting date: 01/01/18 Today's weight : Weight: 247 lb (112 kg)  Today's date: 10/06/18 Total lbs lost to date: 17 lbs At least 15 minutes were spent on discussing the following behavioral intervention visit.   ASK: We discussed the diagnosis of obesity with Amy Mahoney today and Amy Mahoney agreed to give [name] permission to discuss obesity behavioral modification therapy today.  ASSESS: Charrise has the diagnosis of obesity and her BMI today is 41.1 Amy Mahoney is in the action stage of change   ADVISE: Amy Mahoney was educated on the multiple health risks of obesity as well as the benefit  of weight loss to improve her health. She was advised of the need for long term treatment and the importance of lifestyle modifications to improve her current health and to decrease her risk of future health problems.  AGREE: Multiple dietary modification options and treatment options were discussed and  Amy Mahoney agreed to follow the recommendations documented in the above note.  ARRANGE: Amy Mahoney was educated on the importance of frequent visits to treat obesity as outlined per CMS and USPSTF guidelines and agreed to schedule her next follow up appointment today.  I, Renee Ramus, am acting as transcriptionist for Ilene Qua, MD   I have reviewed the above documentation for accuracy and completeness, and I agree with the above. - Ilene Qua, MD

## 2018-10-17 ENCOUNTER — Ambulatory Visit: Payer: Medicare Other | Admitting: Internal Medicine

## 2018-10-20 ENCOUNTER — Ambulatory Visit (INDEPENDENT_AMBULATORY_CARE_PROVIDER_SITE_OTHER): Payer: Medicare Other | Admitting: Family Medicine

## 2018-10-22 ENCOUNTER — Other Ambulatory Visit (INDEPENDENT_AMBULATORY_CARE_PROVIDER_SITE_OTHER): Payer: Self-pay | Admitting: Family Medicine

## 2018-10-22 DIAGNOSIS — E559 Vitamin D deficiency, unspecified: Secondary | ICD-10-CM

## 2018-10-28 ENCOUNTER — Other Ambulatory Visit (INDEPENDENT_AMBULATORY_CARE_PROVIDER_SITE_OTHER): Payer: Self-pay | Admitting: Family Medicine

## 2018-10-28 DIAGNOSIS — E1165 Type 2 diabetes mellitus with hyperglycemia: Secondary | ICD-10-CM

## 2018-11-01 ENCOUNTER — Other Ambulatory Visit: Payer: Self-pay | Admitting: Family Medicine

## 2018-11-01 DIAGNOSIS — E785 Hyperlipidemia, unspecified: Secondary | ICD-10-CM

## 2018-11-04 ENCOUNTER — Encounter: Payer: Self-pay | Admitting: Family Medicine

## 2018-11-04 NOTE — Telephone Encounter (Signed)
We can refill them if she needs them F/u with [name] in Nov

## 2018-11-06 ENCOUNTER — Encounter: Payer: Self-pay | Admitting: Family Medicine

## 2018-11-06 DIAGNOSIS — E559 Vitamin D deficiency, unspecified: Secondary | ICD-10-CM

## 2018-11-06 DIAGNOSIS — E1165 Type 2 diabetes mellitus with hyperglycemia: Secondary | ICD-10-CM

## 2018-11-06 DIAGNOSIS — E038 Other specified hypothyroidism: Secondary | ICD-10-CM

## 2018-11-06 MED ORDER — LEVOTHYROXINE SODIUM 112 MCG PO TABS: 112.0000 ug | ORAL_TABLET | Freq: Every day | ORAL | 1 refills | Status: DC

## 2018-11-06 MED ORDER — VITAMIN D (ERGOCALCIFEROL) 1.25 MG (50000 UNIT) PO CAPS: 50000.0000 [IU] | ORAL_CAPSULE | ORAL | 2 refills | Status: DC

## 2018-11-06 MED ORDER — LISINOPRIL 5 MG PO TABS: 5.0000 mg | ORAL_TABLET | Freq: Every day | ORAL | 1 refills | Status: DC

## 2018-11-06 MED ORDER — METFORMIN HCL 500 MG PO TABS: 500.0000 mg | ORAL_TABLET | Freq: Every day | ORAL | 1 refills | Status: DC

## 2018-11-14 ENCOUNTER — Ambulatory Visit (INDEPENDENT_AMBULATORY_CARE_PROVIDER_SITE_OTHER): Payer: Medicare Other

## 2018-11-14 ENCOUNTER — Other Ambulatory Visit: Payer: Self-pay

## 2018-11-14 DIAGNOSIS — Z23 Encounter for immunization: Secondary | ICD-10-CM

## 2018-11-14 NOTE — Progress Notes (Signed)
Here for flu shot

## 2018-11-29 ENCOUNTER — Encounter: Payer: Self-pay | Admitting: Family Medicine

## 2018-11-29 ENCOUNTER — Other Ambulatory Visit: Payer: Self-pay | Admitting: Family Medicine

## 2018-12-01 NOTE — Telephone Encounter (Signed)
Many times its the dose they don't cover-----  Do they cover another dose? Otherwise ask about lovaza or vascepa.

## 2018-12-02 ENCOUNTER — Other Ambulatory Visit: Payer: Self-pay | Admitting: *Deleted

## 2018-12-02 DIAGNOSIS — E038 Other specified hypothyroidism: Secondary | ICD-10-CM

## 2018-12-02 MED ORDER — LEVOTHYROXINE SODIUM 112 MCG PO TABS: 112.0000 ug | ORAL_TABLET | Freq: Every day | ORAL | 1 refills | Status: DC

## 2018-12-03 ENCOUNTER — Other Ambulatory Visit: Payer: Self-pay

## 2018-12-03 ENCOUNTER — Encounter: Payer: Self-pay | Admitting: Family Medicine

## 2018-12-03 ENCOUNTER — Ambulatory Visit (INDEPENDENT_AMBULATORY_CARE_PROVIDER_SITE_OTHER): Payer: Medicare Other | Admitting: Family Medicine

## 2018-12-03 DIAGNOSIS — F321 Major depressive disorder, single episode, moderate: Secondary | ICD-10-CM

## 2018-12-03 MED ORDER — VENLAFAXINE HCL ER 150 MG PO CP24: 150.0000 mg | ORAL_CAPSULE | Freq: Every day | ORAL | 1 refills | Status: DC

## 2018-12-03 NOTE — Progress Notes (Signed)
Virtual Visit via Video Note  I connected with Amy Mahoney on 12/03/18 at  9:00 AM EDT by a video enabled telemedicine application and verified that I am speaking with the correct person using two identifiers.  Location: Patient: home  Provider: office    I discussed the limitations of evaluation and management by telemedicine and the availability of in person appointments. The patient expressed understanding and agreed to proceed.  History of Present Illness: Pt is home and needs a refill on her effexor.  [facility] says they stopped covering     Observations/Objective: No vitals obtained  Pt is in NAD  Assessment and Plan: 1. Depression, major, single episode, moderate (HCC) Stable--- she [facility] the [facility]-- she will look at her formulary on line in case we need to change it  - venlafaxine XR (EFFEXOR-XR) 150 MG 24 hr capsule; Take 1 capsule (150 mg total) by mouth daily.  Dispense: 90 capsule; Refill: 1  Follow Up Instructions:    I discussed the assessment and treatment plan with the patient. The patient was provided an opportunity to ask questions and all were answered. The patient agreed with the plan and demonstrated an understanding of the instructions.   The patient was advised to call back or seek an in-person evaluation if the symptoms worsen or if the condition fails to improve as anticipated.  I provided 15 minutes of non-face-to-face time during this encounter.   [name], DO

## 2018-12-04 ENCOUNTER — Other Ambulatory Visit (HOSPITAL_BASED_OUTPATIENT_CLINIC_OR_DEPARTMENT_OTHER): Payer: Self-pay | Admitting: Family Medicine

## 2018-12-04 ENCOUNTER — Encounter: Payer: Self-pay | Admitting: Family Medicine

## 2018-12-04 DIAGNOSIS — Z1231 Encounter for screening mammogram for malignant neoplasm of breast: Secondary | ICD-10-CM

## 2018-12-08 ENCOUNTER — Ambulatory Visit (INDEPENDENT_AMBULATORY_CARE_PROVIDER_SITE_OTHER): Payer: Medicare Other | Admitting: Physician Assistant

## 2018-12-08 ENCOUNTER — Other Ambulatory Visit: Payer: Self-pay | Admitting: Family Medicine

## 2018-12-08 ENCOUNTER — Other Ambulatory Visit: Payer: Self-pay

## 2018-12-08 VITALS — BP 130/71 | HR 82 | Temp 98.7°F | Ht 65.0 in | Wt 256.0 lb

## 2018-12-08 DIAGNOSIS — E559 Vitamin D deficiency, unspecified: Secondary | ICD-10-CM | POA: Diagnosis not present

## 2018-12-08 DIAGNOSIS — Z6841 Body Mass Index (BMI) 40.0 and over, adult: Secondary | ICD-10-CM | POA: Diagnosis not present

## 2018-12-08 DIAGNOSIS — E785 Hyperlipidemia, unspecified: Secondary | ICD-10-CM

## 2018-12-08 MED ORDER — FENOFIBRATE 160 MG PO TABS: 160.0000 mg | ORAL_TABLET | Freq: Every day | ORAL | 2 refills | Status: DC

## 2018-12-08 NOTE — Telephone Encounter (Signed)
I first ordered 67 mg but it said not covered when I tried to send it  The one I sent in ---it said covered so I'm a little confused If it is not the correct one -- let us know and we can change it

## 2018-12-10 NOTE — Progress Notes (Signed)
Office: [phone]  /  Fax: [phone]   HPI:   Chief Complaint: OBESITY Amy Mahoney is here to discuss her progress with her obesity treatment plan. She is on the  follow the Category 2 plan and is following her eating plan approximately 50 % of the time. She states she is weight lifting and doing cardio exercise 45 minutes 3 times per week. Amy Mahoney reports that she has been doing some emotional eating recently and she is overeating her snack calories with sweets. Her weight is 256 lb (116.1 kg) today and has had a weight gain of 9 pounds over a period of 9 weeks since her last visit. She has lost 8 lbs since starting treatment with [name].  Vitamin D deficiency Amy Mahoney has a diagnosis of vitamin D deficiency. Amy Mahoney is currently taking vit D and she denies nausea, vomiting or muscle weakness.  ASSESSMENT AND PLAN:  Vitamin D deficiency  Class 3 severe obesity with serious comorbidity and body mass index (BMI) of 40.0 to 44.9 in adult, unspecified obesity type ([place])  PLAN:  Vitamin D Deficiency Amy Mahoney was informed that low vitamin D levels contributes to fatigue and are associated with obesity, breast, and colon cancer. Amy Mahoney will continue to take prescription Vit D @50 ,000 IU every week and will follow up for routine testing of vitamin D, at least 2-3 times per year. She was informed of the risk of over-replacement of vitamin D and agrees to not increase her dose unless she discusses this with [name] first.  Obesity Amy Mahoney is currently in the action stage of change. As such, her goal is to continue with weight loss efforts She has agreed to keep a food journal with 400 to 500 calories and 35 grams of protein at supper daily and follow the Category 2 plan Amy Mahoney has been instructed to work up to a goal of 150 minutes of combined cardio and strengthening exercise per week for weight loss and overall health benefits. We discussed the following Behavioral Modification Strategies today: keeping healthy foods in  the home and work on meal planning and easy cooking plans  Amy Mahoney has agreed to follow up with our clinic in 2 weeks. She was informed of the importance of frequent follow up visits to maximize her success with intensive lifestyle modifications for her multiple health conditions.  I spent > than 50% of the 25 minute visit on counseling as documented in the note.    ALLERGIES: No Known Allergies  MEDICATIONS: Current Outpatient Medications on File Prior to Visit  Medication Sig Dispense Refill   atorvastatin (LIPITOR) 40 MG tablet Take 1 tablet (40 mg total) by mouth daily. Needs ov 90 tablet 0   Calcium Carbonate-Vitamin D (CALCIUM 500 + D PO) Take 1 tablet by mouth 2 (two) times daily.       levothyroxine (SYNTHROID) 112 MCG tablet Take 1 tablet (112 mcg total) by mouth daily before breakfast. 90 tablet 1   lisinopril (ZESTRIL) 5 MG tablet Take 1 tablet (5 mg total) by mouth daily. 90 tablet 1   loratadine (CLARITIN) 10 MG tablet Take 1 tablet (10 mg total) by mouth daily. 30 tablet 11   metFORMIN (GLUCOPHAGE) 500 MG tablet Take 1 tablet (500 mg total) by mouth daily with breakfast. 90 tablet 1   Multiple Vitamin (MULTIVITAMIN) tablet Take 1 tablet by mouth daily.       venlafaxine XR (EFFEXOR-XR) 150 MG 24 hr capsule Take 1 capsule (150 mg total) by mouth daily. 90 capsule 1  Vitamin D, Ergocalciferol, (DRISDOL) 1.25 MG (50000 UT) CAPS capsule Take 1 capsule (50,000 Units total) by mouth every 7 (seven) days. 12 capsule 2   vitamin E 400 UNIT capsule Take 400 Units by mouth daily.     No current facility-administered medications on file prior to visit.     PAST MEDICAL HISTORY: Past Medical History:  Diagnosis Date   Anxiety    Back pain    Constipation    Depression    Diabetes mellitus without complication ([place])    Gallbladder problem    Heart murmur    Hyperlipidemia    Hypertension    Prediabetes    Sleep apnea    On CPAP   Spider bite     Swelling    Thyroid disease    hypothyroidism    PAST SURGICAL HISTORY: Past Surgical History:  Procedure Laterality Date   CHOLECYSTECTOMY     DILATION AND CURETTAGE OF UTERUS     ESOPHAGOGASTRODUODENOSCOPY (EGD) WITH PROPOFOL N/A 09/05/2017   Procedure: ESOPHAGOGASTRODUODENOSCOPY (EGD) WITH PROPOFOL;  Surgeon: Jerene Bears, MD;  Location: [facility];  Service: Gastroenterology;  Laterality: N/A;   fibroids scrapping     TONSILLECTOMY      SOCIAL HISTORY: Social History   Tobacco Use   Smoking status: Former Smoker    Packs/day: 0.50    Years: 3.00    Pack years: 1.50    Types: Cigarettes    Quit date: 08/10/1990    Years since quitting: 28.3   Smokeless tobacco: Never Used  Substance Use Topics   Alcohol use: Yes    Comment: rarely   Drug use: No    FAMILY HISTORY: Family History  Problem Relation Age of Onset   Diabetes Mother    Thyroid disease Mother    Depression Mother    Depression Unknown    Hyperlipidemia Unknown    Arthritis Unknown    Diabetes Maternal Aunt    Breast cancer Paternal Grandmother    Lung cancer Maternal Aunt        Nonsmoker   Cancer Maternal Aunt        breast   Breast cancer Maternal Aunt    Cancer Maternal Aunt        lung(nonsmoker)   Colon cancer Neg Hx    Pancreatic cancer Neg Hx    Stomach cancer Neg Hx    Esophageal cancer Neg Hx    Rectal cancer Neg Hx     ROS: Review of Systems  Constitutional: Negative for weight loss.  Gastrointestinal: Negative for nausea and vomiting.  Musculoskeletal:       Negative for muscle weakness    PHYSICAL EXAM: Blood pressure 130/71, pulse 82, temperature 98.7 F (37.1 C), temperature source Oral, height 5\' 5"  (1.651 m), weight 256 lb (116.1 kg), SpO2 95 %. Body mass index is 42.6 kg/m. Physical Exam Vitals signs reviewed.  Constitutional:      Appearance: Normal appearance. She is well-developed. She is obese.  Cardiovascular:     Rate and  Rhythm: Normal rate.  Pulmonary:     Effort: Pulmonary effort is normal.  Musculoskeletal: Normal range of motion.  Skin:    General: Skin is warm and dry.  Neurological:     Mental Status: She is alert and oriented to person, place, and time.  Psychiatric:        Mood and Affect: Mood normal.        Behavior: Behavior normal.     RECENT LABS  AND TESTS: BMET    Component Value Date/Time   NA 142 10/06/2018 1039   K 5.1 10/06/2018 1039   CL 103 10/06/2018 1039   CO2 26 10/06/2018 1039   GLUCOSE 124 (H) 10/06/2018 1039   GLUCOSE 150 (H) 10/17/2017 1416   BUN 26 10/06/2018 1039   CREATININE 0.88 10/06/2018 1039   CALCIUM 9.7 10/06/2018 1039   GFRNONAA 68 10/06/2018 1039   GFRAA 78 10/06/2018 1039   Lab Results  Component Value Date   HGBA1C 6.2 (H) 10/06/2018   HGBA1C 6.0 (H) 04/14/2018   HGBA1C 6.9 (H) 01/01/2018   HGBA1C 7.4 (H) 10/17/2017   HGBA1C 6.6 (H) 05/24/2017   Lab Results  Component Value Date   INSULIN 12.6 10/06/2018   INSULIN 17.1 04/14/2018   INSULIN 14.9 01/01/2018   CBC    Component Value Date/Time   WBC 6.2 01/01/2018 1136   WBC 6.1 10/17/2017 1416   RBC 4.21 01/01/2018 1136   RBC 4.24 10/17/2017 1416   HGB 12.5 01/01/2018 1136   HCT 38.0 01/01/2018 1136   PLT 305 01/01/2018 1136   MCV 90 01/01/2018 1136   MCH 29.7 01/01/2018 1136   MCH 30.6 12/26/2013 1540   MCHC 32.9 01/01/2018 1136   MCHC 33.4 10/17/2017 1416   RDW 12.5 01/01/2018 1136   LYMPHSABS 2.3 01/01/2018 1136   MONOABS 0.5 10/17/2017 1416   EOSABS 0.4 01/01/2018 1136   BASOSABS 0.1 01/01/2018 1136   Iron/TIBC/Ferritin/ %Sat No results found for: IRON, TIBC, FERRITIN, IRONPCTSAT Lipid Panel     Component Value Date/Time   CHOL 115 04/14/2018 1019   TRIG 75 04/14/2018 1019   HDL 40 04/14/2018 1019   CHOLHDL 3.4 01/01/2018 1136   CHOLHDL 3 10/17/2017 1416   VLDL 33.0 10/17/2017 1416   LDLCALC 60 04/14/2018 1019   LDLDIRECT 193.3 11/06/2012 0926   Hepatic Function  Panel     Component Value Date/Time   PROT 6.7 10/06/2018 1039   ALBUMIN 4.3 10/06/2018 1039   AST 28 10/06/2018 1039   ALT 19 10/06/2018 1039   ALKPHOS 44 10/06/2018 1039   BILITOT 0.3 10/06/2018 1039   BILIDIR 0.1 08/09/2014 1628      Component Value Date/Time   TSH 2.240 10/06/2018 1039   TSH 2.040 04/14/2018 1019   TSH 6.010 (H) 01/01/2018 1136     Ref. Range 10/06/2018 10:39  Vitamin D, 25-Hydroxy Latest Ref Range: 30.0 - 100.0 ng/mL 43.0    OBESITY BEHAVIORAL INTERVENTION VISIT  Today's visit was # 16   Starting weight: 264 lbs Starting date: 01/01/2018 Today's weight : 256 lbs Today's date: 12/08/2018 Total lbs lost to date: 8    12/08/2018  Height 5\' 5"  (1.651 m)  Weight 256 lb (116.1 kg)  BMI (Calculated) 42.6  BLOOD PRESSURE - SYSTOLIC [id number]  BLOOD PRESSURE - DIASTOLIC 71   Body Fat % [id number] %    ASK: We discussed the diagnosis of obesity with Amy Mahoney today and Amy Mahoney agreed to give [name] permission to discuss obesity behavioral modification therapy today.  ASSESS: Amy Mahoney has the diagnosis of obesity and her BMI today is 51.6 Amy Mahoney is in the action stage of change   ADVISE: Amy Mahoney was educated on the multiple health risks of obesity as well as the benefit of weight loss to improve her health. She was advised of the need for long term treatment and the importance of lifestyle modifications to improve her current health and to decrease her risk of future health  problems.  AGREE: Multiple dietary modification options and treatment options were discussed and  Amy Mahoney agreed to follow the recommendations documented in the above note.  ARRANGE: Amy Mahoney was educated on the importance of frequent visits to treat obesity as outlined per CMS and USPSTF guidelines and agreed to schedule her next follow up appointment today.  Corey Skains, am acting as transcriptionist for Abby Potash, PA-C I, Abby Potash, PA-C have reviewed above note and agree with its content

## 2018-12-25 ENCOUNTER — Other Ambulatory Visit: Payer: Self-pay

## 2018-12-25 ENCOUNTER — Encounter (INDEPENDENT_AMBULATORY_CARE_PROVIDER_SITE_OTHER): Payer: Self-pay | Admitting: Physician Assistant

## 2018-12-25 ENCOUNTER — Ambulatory Visit (INDEPENDENT_AMBULATORY_CARE_PROVIDER_SITE_OTHER): Payer: Medicare Other | Admitting: Physician Assistant

## 2018-12-25 VITALS — BP 120/72 | HR 74 | Temp 98.3°F | Ht 65.0 in | Wt 249.0 lb

## 2018-12-25 DIAGNOSIS — E7849 Other hyperlipidemia: Secondary | ICD-10-CM | POA: Diagnosis not present

## 2018-12-25 DIAGNOSIS — Z6841 Body Mass Index (BMI) 40.0 and over, adult: Secondary | ICD-10-CM

## 2018-12-29 NOTE — Progress Notes (Signed)
Office: [phone]  /  Fax: [phone]   HPI:   Chief Complaint: OBESITY Velta is here to discuss her progress with her obesity treatment plan. She is on the Category 2 plan and is following her eating plan approximately 95 % of the time. She states she is walking and exercising with a trainer 40 to 45 minutes 3 to 5 times per week. Anthonella did very well on the plan the past few weeks. She reports that her cravings are under much better control. She is traveling to the beach for a week, right after Thanksgiving. Her weight is 249 lb (112.9 kg) today and has had a weight loss of 7 pounds over a period of 2 to 3 weeks since her last visit. She has lost 15 lbs since starting treatment with [name].  Hyperlipidemia Juleigh has hyperlipidemia and she is not on medications. She has been trying to improve her cholesterol levels with intensive lifestyle modification including a low saturated fat diet, exercise and weight loss. She denies any chest pain.  ASSESSMENT AND PLAN:  Other hyperlipidemia  Class 3 severe obesity with serious comorbidity and body mass index (BMI) of 40.0 to 44.9 in adult, unspecified obesity type ([place])  PLAN:  Hyperlipidemia Kadeisha was informed of the American Heart Association Guidelines emphasizing intensive lifestyle modifications as the first line treatment for hyperlipidemia. We discussed many lifestyle modifications today in depth, and Dijana will continue to work on decreasing saturated fats such as fatty red meat, butter and many fried foods. She will also increase vegetables and lean protein in her diet and continue to work on exercise and weight loss efforts.  Obesity Alahnna is currently in the action stage of change. As such, her goal is to continue with weight loss efforts She has agreed to keep a food journal with 400 to 500 calories and 35 grams of protein at supper daily and follow the Category 2 plan Aleaha has been instructed to work up to a goal of 150 minutes of  combined cardio and strengthening exercise per week for weight loss and overall health benefits. We discussed the following Behavioral Modification Strategies today: work on meal planning and easy cooking plans and holiday eating strategies   Freyja has agreed to follow up with our clinic in 3 to 4 weeks. She was informed of the importance of frequent follow up visits to maximize her success with intensive lifestyle modifications for her multiple health conditions.  ALLERGIES: No Known Allergies  MEDICATIONS: Current Outpatient Medications on File Prior to Visit  Medication Sig Dispense Refill   atorvastatin (LIPITOR) 40 MG tablet Take 1 tablet (40 mg total) by mouth daily. Needs ov 90 tablet 0   Calcium Carbonate-Vitamin D (CALCIUM 500 + D PO) Take 1 tablet by mouth 2 (two) times daily.       levothyroxine (SYNTHROID) 112 MCG tablet Take 1 tablet (112 mcg total) by mouth daily before breakfast. 90 tablet 1   lisinopril (ZESTRIL) 5 MG tablet Take 1 tablet (5 mg total) by mouth daily. 90 tablet 1   loratadine (CLARITIN) 10 MG tablet Take 1 tablet (10 mg total) by mouth daily. 30 tablet 11   metFORMIN (GLUCOPHAGE) 500 MG tablet Take 1 tablet (500 mg total) by mouth daily with breakfast. 90 tablet 1   Multiple Vitamin (MULTIVITAMIN) tablet Take 1 tablet by mouth daily.       venlafaxine XR (EFFEXOR-XR) 150 MG 24 hr capsule Take 1 capsule (150 mg total) by mouth daily. 90 capsule  1   Vitamin D, Ergocalciferol, (DRISDOL) 1.25 MG (50000 UT) CAPS capsule Take 1 capsule (50,000 Units total) by mouth every 7 (seven) days. 12 capsule 2   vitamin E 400 UNIT capsule Take 400 Units by mouth daily.     No current facility-administered medications on file prior to visit.     PAST MEDICAL HISTORY: Past Medical History:  Diagnosis Date   Anxiety    Back pain    Constipation    Depression    Diabetes mellitus without complication ([place])    Gallbladder problem    Heart murmur     Hyperlipidemia    Hypertension    Prediabetes    Sleep apnea    On CPAP   Spider bite    Swelling    Thyroid disease    hypothyroidism    PAST SURGICAL HISTORY: Past Surgical History:  Procedure Laterality Date   CHOLECYSTECTOMY     DILATION AND CURETTAGE OF UTERUS     ESOPHAGOGASTRODUODENOSCOPY (EGD) WITH PROPOFOL N/A 09/05/2017   Procedure: ESOPHAGOGASTRODUODENOSCOPY (EGD) WITH PROPOFOL;  Surgeon: Jerene Bears, MD;  Location: [facility];  Service: Gastroenterology;  Laterality: N/A;   fibroids scrapping     TONSILLECTOMY      SOCIAL HISTORY: Social History   Tobacco Use   Smoking status: Former Smoker    Packs/day: 0.50    Years: 3.00    Pack years: 1.50    Types: Cigarettes    Quit date: 08/10/1990    Years since quitting: 28.4   Smokeless tobacco: Never Used  Substance Use Topics   Alcohol use: Yes    Comment: rarely   Drug use: No    FAMILY HISTORY: Family History  Problem Relation Age of Onset   Diabetes Mother    Thyroid disease Mother    Depression Mother    Depression Unknown    Hyperlipidemia Unknown    Arthritis Unknown    Diabetes Maternal Aunt    Breast cancer Paternal Grandmother    Lung cancer Maternal Aunt        Nonsmoker   Cancer Maternal Aunt        breast   Breast cancer Maternal Aunt    Cancer Maternal Aunt        lung(nonsmoker)   Colon cancer Neg Hx    Pancreatic cancer Neg Hx    Stomach cancer Neg Hx    Esophageal cancer Neg Hx    Rectal cancer Neg Hx     ROS: Review of Systems  Constitutional: Positive for weight loss.  Cardiovascular: Negative for chest pain.    PHYSICAL EXAM: Blood pressure 120/72, pulse 74, temperature 98.3 F (36.8 C), temperature source Oral, height 5\' 5"  (1.651 m), weight 249 lb (112.9 kg), SpO2 96 %. Body mass index is 41.44 kg/m. Physical Exam Vitals signs reviewed.  Constitutional:      Appearance: Normal appearance. She is well-developed. She is obese.    Cardiovascular:     Rate and Rhythm: Normal rate.  Pulmonary:     Effort: Pulmonary effort is normal.  Musculoskeletal: Normal range of motion.  Skin:    General: Skin is warm and dry.  Neurological:     Mental Status: She is alert and oriented to person, place, and time.  Psychiatric:        Mood and Affect: Mood normal.        Behavior: Behavior normal.     RECENT LABS AND TESTS: BMET    Component Value Date/Time  NA 142 10/06/2018 1039   K 5.1 10/06/2018 1039   CL 103 10/06/2018 1039   CO2 26 10/06/2018 1039   GLUCOSE 124 (H) 10/06/2018 1039   GLUCOSE 150 (H) 10/17/2017 1416   BUN 26 10/06/2018 1039   CREATININE 0.88 10/06/2018 1039   CALCIUM 9.7 10/06/2018 1039   GFRNONAA 68 10/06/2018 1039   GFRAA 78 10/06/2018 1039   Lab Results  Component Value Date   HGBA1C 6.2 (H) 10/06/2018   HGBA1C 6.0 (H) 04/14/2018   HGBA1C 6.9 (H) 01/01/2018   HGBA1C 7.4 (H) 10/17/2017   HGBA1C 6.6 (H) 05/24/2017   Lab Results  Component Value Date   INSULIN 12.6 10/06/2018   INSULIN 17.1 04/14/2018   INSULIN 14.9 01/01/2018   CBC    Component Value Date/Time   WBC 6.2 01/01/2018 1136   WBC 6.1 10/17/2017 1416   RBC 4.21 01/01/2018 1136   RBC 4.24 10/17/2017 1416   HGB 12.5 01/01/2018 1136   HCT 38.0 01/01/2018 1136   PLT 305 01/01/2018 1136   MCV 90 01/01/2018 1136   MCH 29.7 01/01/2018 1136   MCH 30.6 12/26/2013 1540   MCHC 32.9 01/01/2018 1136   MCHC 33.4 10/17/2017 1416   RDW 12.5 01/01/2018 1136   LYMPHSABS 2.3 01/01/2018 1136   MONOABS 0.5 10/17/2017 1416   EOSABS 0.4 01/01/2018 1136   BASOSABS 0.1 01/01/2018 1136   Iron/TIBC/Ferritin/ %Sat No results found for: IRON, TIBC, FERRITIN, IRONPCTSAT Lipid Panel     Component Value Date/Time   CHOL 115 04/14/2018 1019   TRIG 75 04/14/2018 1019   HDL 40 04/14/2018 1019   CHOLHDL 3.4 01/01/2018 1136   CHOLHDL 3 10/17/2017 1416   VLDL 33.0 10/17/2017 1416   LDLCALC 60 04/14/2018 1019   LDLDIRECT 193.3  11/06/2012 0926   Hepatic Function Panel     Component Value Date/Time   PROT 6.7 10/06/2018 1039   ALBUMIN 4.3 10/06/2018 1039   AST 28 10/06/2018 1039   ALT 19 10/06/2018 1039   ALKPHOS 44 10/06/2018 1039   BILITOT 0.3 10/06/2018 1039   BILIDIR 0.1 08/09/2014 1628      Component Value Date/Time   TSH 2.240 10/06/2018 1039   TSH 2.040 04/14/2018 1019   TSH 6.010 (H) 01/01/2018 1136     Ref. Range 10/06/2018 10:39  Vitamin D, 25-Hydroxy Latest Ref Range: 30.0 - 100.0 ng/mL 43.0    OBESITY BEHAVIORAL INTERVENTION VISIT  Today's visit was # 17   Starting weight: 264 lbs Starting date: 01/01/2018 Today's weight : 249 lbs Today's date: 12/25/2018 Total lbs lost to date: 15    12/25/2018  Height 5\' 5"  (1.651 m)  Weight 249 lb (112.9 kg)  BMI (Calculated) 41.44  BLOOD PRESSURE - SYSTOLIC [id number]  BLOOD PRESSURE - DIASTOLIC 72   Body Fat % [id number] %  Total Body Water (lbs) 89 lbs    ASK: We discussed the diagnosis of obesity with [name] today and Emahni agreed to give [name] permission to discuss obesity behavioral modification therapy today.  ASSESS: Cristela has the diagnosis of obesity and her BMI today is 41.44 Charika is in the action stage of change   ADVISE: Wilson was educated on the multiple health risks of obesity as well as the benefit of weight loss to improve her health. She was advised of the need for long term treatment and the importance of lifestyle modifications to improve her current health and to decrease her risk of future health problems.  AGREE: Multiple  dietary modification options and treatment options were discussed and  Lichelle agreed to follow the recommendations documented in the above note.  ARRANGE: Chemeka was educated on the importance of frequent visits to treat obesity as outlined per CMS and USPSTF guidelines and agreed to schedule her next follow up appointment today.  Corey Skains, am acting as transcriptionist for Abby Potash, PA-C I, Abby Potash, PA-C have reviewed above note and agree with its content

## 2019-01-21 ENCOUNTER — Other Ambulatory Visit: Payer: Self-pay | Admitting: Family Medicine

## 2019-01-21 DIAGNOSIS — E785 Hyperlipidemia, unspecified: Secondary | ICD-10-CM

## 2019-01-26 ENCOUNTER — Encounter (INDEPENDENT_AMBULATORY_CARE_PROVIDER_SITE_OTHER): Payer: Self-pay | Admitting: Physician Assistant

## 2019-01-26 ENCOUNTER — Other Ambulatory Visit: Payer: Self-pay

## 2019-01-26 ENCOUNTER — Telehealth (INDEPENDENT_AMBULATORY_CARE_PROVIDER_SITE_OTHER): Payer: Medicare Other | Admitting: Physician Assistant

## 2019-01-26 DIAGNOSIS — E559 Vitamin D deficiency, unspecified: Secondary | ICD-10-CM

## 2019-01-26 DIAGNOSIS — Z6841 Body Mass Index (BMI) 40.0 and over, adult: Secondary | ICD-10-CM

## 2019-01-26 NOTE — Progress Notes (Signed)
Office: [phone]  /  Fax: [phone] TeleHealth Visit:  Amy Mahoney has verbally consented to this TeleHealth visit today. The patient is located at home, the provider is located at the [employer] [facility] office. The participants in this visit include the listed provider and patient and any and all parties involved. The visit was conducted today via WebEx.  HPI:   Chief Complaint: OBESITY Amy Mahoney is here to discuss her progress with her obesity treatment plan. She is on the Category 2 plan and is following her eating plan approximately 80 to 90 % of the time. She states she is walking 60 minutes 7 times per week. Amy Mahoney's most recent weight is 245 pounds. She reports that she ate very well over Thanksgiving, and she has not struggled with the plan at all. She already knows what she is making for Christmas and she is having healthy options.  We were unable to weigh the patient today for this TeleHealth visit. She feels as if she has lost weight since her last visit (weight 245 lbs 01/25/19). She has lost 15 lbs since starting treatment with [name].  Vitamin D deficiency Amy Mahoney has a diagnosis of vitamin D deficiency. Amy Mahoney is currently on vit D and she denies nausea, vomiting or muscle weakness. Amy Mahoney is staying inside the house most days.  ASSESSMENT AND PLAN:  Vitamin D deficiency  Class 3 severe obesity with serious comorbidity and body mass index (BMI) of 40.0 to 44.9 in adult, unspecified obesity type (HCC)  PLAN:  Vitamin D Deficiency Low vitamin D level contributes to fatigue and are associated with obesity, breast, and colon cancer. She will continue to take prescription vitamin D 50,000 IU weekly and she will follow up for routine testing of vitamin D, at least 2-3 times per year to avoid over-replacement.  Obesity Amy Mahoney is currently in the action stage of change. As such, her goal is to continue with weight loss efforts She has agreed to follow the Category 2 plan Amy Mahoney has  been instructed to work up to a goal of 150 minutes of combined cardio and strengthening exercise per week for weight loss and overall health benefits. We discussed the following Behavioral Modification Strategies today: keeping healthy foods in the home and work on meal planning and easy cooking plans  Amy Mahoney has agreed to follow up with our clinic in 2 weeks. She was informed of the importance of frequent follow up visits to maximize her success with intensive lifestyle modifications for her multiple health conditions.  ALLERGIES: No Known Allergies  MEDICATIONS: Current Outpatient Medications on File Prior to Visit  Medication Sig Dispense Refill   atorvastatin (LIPITOR) 40 MG tablet TAKE 1 TABLET BY MOUTH  DAILY 90 tablet 0   Calcium Carbonate-Vitamin D (CALCIUM 500 + D PO) Take 1 tablet by mouth 2 (two) times daily.       levothyroxine (SYNTHROID) 112 MCG tablet Take 1 tablet (112 mcg total) by mouth daily before breakfast. 90 tablet 1   lisinopril (ZESTRIL) 5 MG tablet Take 1 tablet (5 mg total) by mouth daily. 90 tablet 1   loratadine (CLARITIN) 10 MG tablet Take 1 tablet (10 mg total) by mouth daily. 30 tablet 11   metFORMIN (GLUCOPHAGE) 500 MG tablet Take 1 tablet (500 mg total) by mouth daily with breakfast. 90 tablet 1   Multiple Vitamin (MULTIVITAMIN) tablet Take 1 tablet by mouth daily.       venlafaxine XR (EFFEXOR-XR) 150 MG 24 hr capsule Take 1  capsule (150 mg total) by mouth daily. 90 capsule 1   Vitamin D, Ergocalciferol, (DRISDOL) 1.25 MG (50000 UT) CAPS capsule Take 1 capsule (50,000 Units total) by mouth every 7 (seven) days. 12 capsule 2   vitamin E 400 UNIT capsule Take 400 Units by mouth daily.     No current facility-administered medications on file prior to visit.     PAST MEDICAL HISTORY: Past Medical History:  Diagnosis Date   Anxiety    Back pain    Constipation    Depression    Diabetes mellitus without complication ([place])    Gallbladder  problem    Heart murmur    Hyperlipidemia    Hypertension    Prediabetes    Sleep apnea    On CPAP   Spider bite    Swelling    Thyroid disease    hypothyroidism    PAST SURGICAL HISTORY: Past Surgical History:  Procedure Laterality Date   CHOLECYSTECTOMY     DILATION AND CURETTAGE OF UTERUS     ESOPHAGOGASTRODUODENOSCOPY (EGD) WITH PROPOFOL N/A 09/05/2017   Procedure: ESOPHAGOGASTRODUODENOSCOPY (EGD) WITH PROPOFOL;  Surgeon: Jerene Bears, MD;  Location: [facility];  Service: Gastroenterology;  Laterality: N/A;   fibroids scrapping     TONSILLECTOMY      SOCIAL HISTORY: Social History   Tobacco Use   Smoking status: Former Smoker    Packs/day: 0.50    Years: 3.00    Pack years: 1.50    Types: Cigarettes    Quit date: 08/10/1990    Years since quitting: 28.4   Smokeless tobacco: Never Used  Substance Use Topics   Alcohol use: Yes    Comment: rarely   Drug use: No    FAMILY HISTORY: Family History  Problem Relation Age of Onset   Diabetes Mother    Thyroid disease Mother    Depression Mother    Depression Unknown    Hyperlipidemia Unknown    Arthritis Unknown    Diabetes Maternal Aunt    Breast cancer Paternal Grandmother    Lung cancer Maternal Aunt        Nonsmoker   Cancer Maternal Aunt        breast   Breast cancer Maternal Aunt    Cancer Maternal Aunt        lung(nonsmoker)   Colon cancer Neg Hx    Pancreatic cancer Neg Hx    Stomach cancer Neg Hx    Esophageal cancer Neg Hx    Rectal cancer Neg Hx     ROS: Review of Systems  Constitutional: Positive for weight loss.  Gastrointestinal: Negative for nausea and vomiting.  Musculoskeletal:       Negative for muscle weakness    PHYSICAL EXAM: Pt in no acute distress  RECENT LABS AND TESTS: BMET    Component Value Date/Time   NA 142 10/06/2018 1039   K 5.1 10/06/2018 1039   CL 103 10/06/2018 1039   CO2 26 10/06/2018 1039   GLUCOSE 124 (H) 10/06/2018  1039   GLUCOSE 150 (H) 10/17/2017 1416   BUN 26 10/06/2018 1039   CREATININE 0.88 10/06/2018 1039   CALCIUM 9.7 10/06/2018 1039   GFRNONAA 68 10/06/2018 1039   GFRAA 78 10/06/2018 1039   Lab Results  Component Value Date   HGBA1C 6.2 (H) 10/06/2018   HGBA1C 6.0 (H) 04/14/2018   HGBA1C 6.9 (H) 01/01/2018   HGBA1C 7.4 (H) 10/17/2017   HGBA1C 6.6 (H) 05/24/2017   Lab Results  Component Value Date   INSULIN 12.6 10/06/2018   INSULIN 17.1 04/14/2018   INSULIN 14.9 01/01/2018   CBC    Component Value Date/Time   WBC 6.2 01/01/2018 1136   WBC 6.1 10/17/2017 1416   RBC 4.21 01/01/2018 1136   RBC 4.24 10/17/2017 1416   HGB 12.5 01/01/2018 1136   HCT 38.0 01/01/2018 1136   PLT 305 01/01/2018 1136   MCV 90 01/01/2018 1136   MCH 29.7 01/01/2018 1136   MCH 30.6 12/26/2013 1540   MCHC 32.9 01/01/2018 1136   MCHC 33.4 10/17/2017 1416   RDW 12.5 01/01/2018 1136   LYMPHSABS 2.3 01/01/2018 1136   MONOABS 0.5 10/17/2017 1416   EOSABS 0.4 01/01/2018 1136   BASOSABS 0.1 01/01/2018 1136   Iron/TIBC/Ferritin/ %Sat No results found for: IRON, TIBC, FERRITIN, IRONPCTSAT Lipid Panel     Component Value Date/Time   CHOL 115 04/14/2018 1019   TRIG 75 04/14/2018 1019   HDL 40 04/14/2018 1019   CHOLHDL 3.4 01/01/2018 1136   CHOLHDL 3 10/17/2017 1416   VLDL 33.0 10/17/2017 1416   LDLCALC 60 04/14/2018 1019   LDLDIRECT 193.3 11/06/2012 0926   Hepatic Function Panel     Component Value Date/Time   PROT 6.7 10/06/2018 1039   ALBUMIN 4.3 10/06/2018 1039   AST 28 10/06/2018 1039   ALT 19 10/06/2018 1039   ALKPHOS 44 10/06/2018 1039   BILITOT 0.3 10/06/2018 1039   BILIDIR 0.1 08/09/2014 1628      Component Value Date/Time   TSH 2.240 10/06/2018 1039   TSH 2.040 04/14/2018 1019   TSH 6.010 (H) 01/01/2018 1136    Results for KHAZA, ZIMPFER (MRN [id number]) as of 01/26/2019 17:02  Ref. Range 10/06/2018 10:39  Vitamin D, 25-Hydroxy Latest Ref Range: 30.0 - 100.0 ng/mL 43.0    I,  Doreene Nest, am acting as Location manager for Abby Potash, PA-C I, Abby Potash, PA-C have reviewed above note and agree with its content

## 2019-02-02 ENCOUNTER — Encounter: Payer: Self-pay | Admitting: Family Medicine

## 2019-02-02 ENCOUNTER — Encounter (INDEPENDENT_AMBULATORY_CARE_PROVIDER_SITE_OTHER): Payer: Self-pay | Admitting: Physician Assistant

## 2019-02-10 ENCOUNTER — Encounter (INDEPENDENT_AMBULATORY_CARE_PROVIDER_SITE_OTHER): Payer: Self-pay | Admitting: Physician Assistant

## 2019-02-10 ENCOUNTER — Telehealth (INDEPENDENT_AMBULATORY_CARE_PROVIDER_SITE_OTHER): Payer: Medicare Other | Admitting: Physician Assistant

## 2019-02-10 ENCOUNTER — Other Ambulatory Visit: Payer: Self-pay

## 2019-02-10 DIAGNOSIS — E7849 Other hyperlipidemia: Secondary | ICD-10-CM

## 2019-02-10 DIAGNOSIS — Z6841 Body Mass Index (BMI) 40.0 and over, adult: Secondary | ICD-10-CM

## 2019-02-10 NOTE — Progress Notes (Signed)
Office: [phone]  /  Fax: [phone] TeleHealth Visit:  Amy Mahoney has verbally consented to this TeleHealth visit today. The patient is located at home, the provider is located at the [employer] [facility] office. The participants in this visit include the listed provider and patient. The visit was conducted today via Webex (17 minutes).  HPI:  Chief Complaint: OBESITY Amy Mahoney is here to discuss her progress with her obesity treatment plan. She is on the Category 2 plan and states she is following her eating plan approximately 90% of the time. She states she is exercising 0 minutes 0 times per week.  Amy Mahoney's most recent weight was 242 lbs. She recently lost her dog and has had a difficult time. She has not been working as much and is doing a good job staying on the plan.  Today's visit was #19 Starting weight: 264 lbs Starting date: 01/01/2018   Hyperlipidemia Amy Mahoney has a diagnosis of hyperlipidemia and is on atorvastatin. No chest pain or myalgias.  ASSESSMENT AND PLAN:  Other hyperlipidemia  Class 3 severe obesity with serious comorbidity and body mass index (BMI) of 40.0 to 44.9 in adult, unspecified obesity type (Amy Mahoney)  PLAN:  Hyperlipidemia Intensive lifestyle modifications as the first line treatment for hyperlipidemia. We discussed many lifestyle modifications today and Amy Mahoney will continue her medications and continue to work on diet, exercise and weight loss efforts.  Obesity Amy Mahoney is currently in the action stage of change. As such, her goal is to continue with weight loss efforts She has agreed to follow the Category 2 plan Amy Mahoney has been instructed to work up to a goal of 150 minutes of combined cardio and strengthening exercise per week for weight loss and overall health benefits. We discussed the following Behavioral Modification Stratagies today: work on meal planning and easy cooking plans, and planning for success.  Amy Mahoney has agreed to follow-up with our  clinic in 3 weeks. She was informed of the importance of frequent follow-up visits to maximize her success with intensive lifestyle modifications for her multiple health conditions.  ALLERGIES: No Known Allergies  MEDICATIONS: Current Outpatient Medications on File Prior to Visit  Medication Sig Dispense Refill  . atorvastatin (LIPITOR) 40 MG tablet TAKE 1 TABLET BY MOUTH  DAILY 90 tablet 0  . Calcium Carbonate-Vitamin D (CALCIUM 500 + D PO) Take 1 tablet by mouth 2 (two) times daily.      [name] levothyroxine (SYNTHROID) 112 MCG tablet Take 1 tablet (112 mcg total) by mouth daily before breakfast. 90 tablet 1  . lisinopril (ZESTRIL) 5 MG tablet Take 1 tablet (5 mg total) by mouth daily. 90 tablet 1  . loratadine (CLARITIN) 10 MG tablet Take 1 tablet (10 mg total) by mouth daily. 30 tablet 11  . metFORMIN (GLUCOPHAGE) 500 MG tablet Take 1 tablet (500 mg total) by mouth daily with breakfast. 90 tablet 1  . Multiple Vitamin (MULTIVITAMIN) tablet Take 1 tablet by mouth daily.      [name] venlafaxine XR (EFFEXOR-XR) 150 MG 24 hr capsule Take 1 capsule (150 mg total) by mouth daily. 90 capsule 1  . Vitamin D, Ergocalciferol, (DRISDOL) 1.25 MG (50000 UT) CAPS capsule Take 1 capsule (50,000 Units total) by mouth every 7 (seven) days. 12 capsule 2  . vitamin E 400 UNIT capsule Take 400 Units by mouth daily.     No current facility-administered medications on file prior to visit.    PAST MEDICAL HISTORY: Past Medical History:  Diagnosis Date  .  Anxiety   . Back pain   . Constipation   . Depression   . Diabetes mellitus without complication ([place])   . Gallbladder problem   . Heart murmur   . Hyperlipidemia   . Hypertension   . Prediabetes   . Sleep apnea    On CPAP  . Spider bite   . Swelling   . Thyroid disease    hypothyroidism    PAST SURGICAL HISTORY: Past Surgical History:  Procedure Laterality Date  . CHOLECYSTECTOMY    . DILATION AND CURETTAGE OF UTERUS    .  ESOPHAGOGASTRODUODENOSCOPY (EGD) WITH PROPOFOL N/A 09/05/2017   Procedure: ESOPHAGOGASTRODUODENOSCOPY (EGD) WITH PROPOFOL;  Surgeon: Jerene Bears, MD;  Location: [facility];  Service: Gastroenterology;  Laterality: N/A;  . fibroids scrapping    . TONSILLECTOMY      SOCIAL HISTORY: Social History   Tobacco Use  . Smoking status: Former Smoker    Packs/day: 0.50    Years: 3.00    Pack years: 1.50    Types: Cigarettes    Quit date: 08/10/1990    Years since quitting: 28.5  . Smokeless tobacco: Never Used  Substance Use Topics  . Alcohol use: Yes    Comment: rarely  . Drug use: No    FAMILY HISTORY: Family History  Problem Relation Age of Onset  . Diabetes Mother   . Thyroid disease Mother   . Depression Mother   . Depression Unknown   . Hyperlipidemia Unknown   . Arthritis Unknown   . Diabetes Maternal Aunt   . Breast cancer Paternal Grandmother   . Lung cancer Maternal Aunt        Nonsmoker  . Cancer Maternal Aunt        breast  . Breast cancer Maternal Aunt   . Cancer Maternal Aunt        lung(nonsmoker)  . Colon cancer Neg Hx   . Pancreatic cancer Neg Hx   . Stomach cancer Neg Hx   . Esophageal cancer Neg Hx   . Rectal cancer Neg Hx    ROS: Review of Systems  Cardiovascular: Negative for chest pain.  Musculoskeletal: Negative for myalgias.   PHYSICAL EXAM: There were no vitals taken for this visit. There is no height or weight on file to calculate BMI. Physical Exam: Pt in no acute distress.  RECENT LABS AND TESTS: BMET    Component Value Date/Time   NA 142 10/06/2018 1039   K 5.1 10/06/2018 1039   CL 103 10/06/2018 1039   CO2 26 10/06/2018 1039   GLUCOSE 124 (H) 10/06/2018 1039   GLUCOSE 150 (H) 10/17/2017 1416   BUN 26 10/06/2018 1039   CREATININE 0.88 10/06/2018 1039   CALCIUM 9.7 10/06/2018 1039   GFRNONAA 68 10/06/2018 1039   GFRAA 78 10/06/2018 1039   Lab Results  Component Value Date   HGBA1C 6.2 (H) 10/06/2018   HGBA1C 6.0 (H)  04/14/2018   HGBA1C 6.9 (H) 01/01/2018   HGBA1C 7.4 (H) 10/17/2017   HGBA1C 6.6 (H) 05/24/2017   Lab Results  Component Value Date   INSULIN 12.6 10/06/2018   INSULIN 17.1 04/14/2018   INSULIN 14.9 01/01/2018   CBC    Component Value Date/Time   WBC 6.2 01/01/2018 1136   WBC 6.1 10/17/2017 1416   RBC 4.21 01/01/2018 1136   RBC 4.24 10/17/2017 1416   HGB 12.5 01/01/2018 1136   HCT 38.0 01/01/2018 1136   PLT 305 01/01/2018 1136   MCV 90  01/01/2018 1136   MCH 29.7 01/01/2018 1136   MCH 30.6 12/26/2013 1540   MCHC 32.9 01/01/2018 1136   MCHC 33.4 10/17/2017 1416   RDW 12.5 01/01/2018 1136   LYMPHSABS 2.3 01/01/2018 1136   MONOABS 0.5 10/17/2017 1416   EOSABS 0.4 01/01/2018 1136   BASOSABS 0.1 01/01/2018 1136   Iron/TIBC/Ferritin/ %Sat No results found for: IRON, TIBC, FERRITIN, IRONPCTSAT Lipid Panel     Component Value Date/Time   CHOL 115 04/14/2018 1019   TRIG 75 04/14/2018 1019   HDL 40 04/14/2018 1019   CHOLHDL 3.4 01/01/2018 1136   CHOLHDL 3 10/17/2017 1416   VLDL 33.0 10/17/2017 1416   LDLCALC 60 04/14/2018 1019   LDLDIRECT 193.3 11/06/2012 0926   Hepatic Function Panel     Component Value Date/Time   PROT 6.7 10/06/2018 1039   ALBUMIN 4.3 10/06/2018 1039   AST 28 10/06/2018 1039   ALT 19 10/06/2018 1039   ALKPHOS 44 10/06/2018 1039   BILITOT 0.3 10/06/2018 1039   BILIDIR 0.1 08/09/2014 1628      Component Value Date/Time   TSH 2.240 10/06/2018 1039   TSH 2.040 04/14/2018 1019   TSH 6.010 (H) 01/01/2018 1136      I, Michaelene Song, am acting as Location manager for Abby Potash, PA-C I, Abby Potash, PA-C have reviewed above note and agree with its content

## 2019-03-04 ENCOUNTER — Telehealth (INDEPENDENT_AMBULATORY_CARE_PROVIDER_SITE_OTHER): Payer: Medicare Other | Admitting: Physician Assistant

## 2019-03-04 ENCOUNTER — Other Ambulatory Visit: Payer: Self-pay

## 2019-03-04 ENCOUNTER — Encounter (INDEPENDENT_AMBULATORY_CARE_PROVIDER_SITE_OTHER): Payer: Self-pay | Admitting: Physician Assistant

## 2019-03-04 DIAGNOSIS — E559 Vitamin D deficiency, unspecified: Secondary | ICD-10-CM

## 2019-03-04 DIAGNOSIS — Z6841 Body Mass Index (BMI) 40.0 and over, adult: Secondary | ICD-10-CM

## 2019-03-08 NOTE — Progress Notes (Signed)
TeleHealth Visit:  Due to the COVID-19 pandemic, this visit was completed with telemedicine (audio/video) technology to reduce patient and provider exposure as well as to preserve personal protective equipment.   Amy Mahoney has verbally consented to this TeleHealth visit. The patient is located at home, the provider is located at the [employer] [facility] office. The participants in this visit include the listed provider and patient and any and all parties involved. The visit was conducted today via WebEx.  Chief Complaint: OBESITY Amy Mahoney is here to discuss her progress with her obesity treatment plan along with follow-up of her obesity related diagnoses. Amy Mahoney is on the Category 2 Plan and states she is following her eating plan approximately 90% of the time. Amy Mahoney states she is walking 45 to 60 minutes 7 times per week.  Today's visit was #: 20 Starting weight: 264 lbs Starting date: 01/01/2018  Interim History: Amy Mahoney's most recent weight is 241 pounds (03/04/19). She reports being slightly disappointed with her weight, because se thought she did well with the plan. She is sometimes not eating all of her calories though she is getting her protein.  Subjective:   Vitamin D deficiency Amy Mahoney's Vitamin D level was 43.0 on 10/06/18. She is currently taking vit D. She denies nausea, vomiting or muscle weakness. Amy Mahoney is due for labs.  Assessment/Plan:   Vitamin D deficiency Low Vitamin D level contributes to fatigue and are associated with obesity, breast, and colon cancer. Amy Mahoney will continue to take prescription Vitamin D @50 ,000 IU every week and she will follow-up for routine testing of Vitamin D, at least 2-3 times per year to avoid over-replacement.  Obesity Amy Mahoney is currently in the action stage of change. As such, her goal is to continue with weight loss efforts. She has agreed to on the Category 2 Plan.   Exercise goals: Older adults should follow the adult guidelines. When older  adults cannot meet the adult guidelines, they should be as physically active as their abilities and conditions will allow.  Older adults should do exercises that maintain or improve balance if they are at risk of falling.   Behavioral modification strategies: meal planning and cooking strategies and keeping healthy foods in the home.  Amy Mahoney has agreed to follow-up with our clinic in 2 weeks. She was informed of the importance of frequent follow-up visits to maximize her success with intensive lifestyle modifications for her multiple health conditions.  Objective:   VITALS: Per patient if applicable, see vitals. GENERAL: Alert and in no acute distress. CARDIOPULMONARY: No increased WOB. Speaking in clear sentences.  PSYCH: Pleasant and cooperative. Speech normal rate and rhythm. Affect is appropriate. Insight and judgement are appropriate. Attention is focused, linear, and appropriate.  NEURO: Oriented as arrived to appointment on time with no prompting.   Lab Results  Component Value Date   CREATININE 0.88 10/06/2018   BUN 26 10/06/2018   NA 142 10/06/2018   K 5.1 10/06/2018   CL 103 10/06/2018   CO2 26 10/06/2018   Lab Results  Component Value Date   ALT 19 10/06/2018   AST 28 10/06/2018   ALKPHOS 44 10/06/2018   BILITOT 0.3 10/06/2018   Lab Results  Component Value Date   HGBA1C 6.2 (H) 10/06/2018   HGBA1C 6.0 (H) 04/14/2018   HGBA1C 6.9 (H) 01/01/2018   HGBA1C 7.4 (H) 10/17/2017   HGBA1C 6.6 (H) 05/24/2017   Lab Results  Component Value Date   INSULIN 12.6 10/06/2018  INSULIN 17.1 04/14/2018   INSULIN 14.9 01/01/2018   Lab Results  Component Value Date   TSH 2.240 10/06/2018   Lab Results  Component Value Date   CHOL 115 04/14/2018   HDL 40 04/14/2018   LDLCALC 60 04/14/2018   LDLDIRECT 193.3 11/06/2012   TRIG 75 04/14/2018   CHOLHDL 3.4 01/01/2018   Lab Results  Component Value Date   WBC 6.2 01/01/2018   HGB 12.5 01/01/2018   HCT 38.0 01/01/2018    MCV 90 01/01/2018   PLT 305 01/01/2018   No results found for: IRON, TIBC, FERRITIN  Attestation Statements:   Reviewed by clinician on day of visit: allergies, medications, problem list, medical history, surgical history, family history, social history, and previous encounter notes.  Time spent on visit including pre-visit chart review and post-visit care was 30 minutes.   Corey Skains, am acting as Location manager for [employer], PA-C.  I have reviewed the above documentation for accuracy and completeness, and I agree with the above. Abby Potash, PA-C

## 2019-03-18 ENCOUNTER — Telehealth (INDEPENDENT_AMBULATORY_CARE_PROVIDER_SITE_OTHER): Payer: Medicare Other | Admitting: Physician Assistant

## 2019-03-25 DIAGNOSIS — L821 Other seborrheic keratosis: Secondary | ICD-10-CM | POA: Diagnosis not present

## 2019-03-25 DIAGNOSIS — I781 Nevus, non-neoplastic: Secondary | ICD-10-CM | POA: Diagnosis not present

## 2019-03-25 DIAGNOSIS — D485 Neoplasm of uncertain behavior of skin: Secondary | ICD-10-CM | POA: Diagnosis not present

## 2019-04-12 ENCOUNTER — Other Ambulatory Visit: Payer: Self-pay | Admitting: Family Medicine

## 2019-04-12 DIAGNOSIS — E785 Hyperlipidemia, unspecified: Secondary | ICD-10-CM

## 2019-04-30 ENCOUNTER — Other Ambulatory Visit: Payer: Self-pay | Admitting: Family Medicine

## 2019-04-30 DIAGNOSIS — E1165 Type 2 diabetes mellitus with hyperglycemia: Secondary | ICD-10-CM

## 2019-05-11 ENCOUNTER — Other Ambulatory Visit: Payer: Self-pay | Admitting: Family Medicine

## 2019-05-11 DIAGNOSIS — E785 Hyperlipidemia, unspecified: Secondary | ICD-10-CM

## 2019-05-13 ENCOUNTER — Other Ambulatory Visit: Payer: Self-pay

## 2019-05-14 ENCOUNTER — Encounter: Payer: Self-pay | Admitting: Family Medicine

## 2019-05-14 ENCOUNTER — Ambulatory Visit (INDEPENDENT_AMBULATORY_CARE_PROVIDER_SITE_OTHER): Payer: Medicare Other | Admitting: Family Medicine

## 2019-05-14 DIAGNOSIS — E559 Vitamin D deficiency, unspecified: Secondary | ICD-10-CM | POA: Diagnosis not present

## 2019-05-14 DIAGNOSIS — E785 Hyperlipidemia, unspecified: Secondary | ICD-10-CM

## 2019-05-14 DIAGNOSIS — E1165 Type 2 diabetes mellitus with hyperglycemia: Secondary | ICD-10-CM | POA: Diagnosis not present

## 2019-05-14 DIAGNOSIS — F321 Major depressive disorder, single episode, moderate: Secondary | ICD-10-CM

## 2019-05-14 DIAGNOSIS — E66813 Obesity, class 3: Secondary | ICD-10-CM

## 2019-05-14 DIAGNOSIS — E038 Other specified hypothyroidism: Secondary | ICD-10-CM

## 2019-05-14 DIAGNOSIS — Z6841 Body Mass Index (BMI) 40.0 and over, adult: Secondary | ICD-10-CM

## 2019-05-14 MED ORDER — LEVOTHYROXINE SODIUM 112 MCG PO TABS: 112.0000 ug | ORAL_TABLET | Freq: Every day | ORAL | 1 refills | Status: DC

## 2019-05-14 MED ORDER — VENLAFAXINE HCL ER 150 MG PO CP24: 150.0000 mg | ORAL_CAPSULE | Freq: Every day | ORAL | 1 refills | Status: DC

## 2019-05-14 MED ORDER — VITAMIN D (ERGOCALCIFEROL) 1.25 MG (50000 UNIT) PO CAPS: 50000.0000 [IU] | ORAL_CAPSULE | ORAL | 2 refills | Status: DC

## 2019-05-14 MED ORDER — LISINOPRIL 5 MG PO TABS: 5.0000 mg | ORAL_TABLET | Freq: Every day | ORAL | 1 refills | Status: DC

## 2019-05-14 MED ORDER — FENOFIBRATE 160 MG PO TABS: 160.0000 mg | ORAL_TABLET | Freq: Every day | ORAL | 1 refills | Status: DC

## 2019-05-14 MED ORDER — METFORMIN HCL 500 MG PO TABS: ORAL_TABLET | ORAL | 1 refills | Status: DC

## 2019-05-14 MED ORDER — ATORVASTATIN CALCIUM 40 MG PO TABS: 40.0000 mg | ORAL_TABLET | Freq: Every day | ORAL | 1 refills | Status: DC

## 2019-05-14 NOTE — Progress Notes (Signed)
Patient ID: Amy Mahoney, female    DOB: Nov 17, 1950  Age: 69 y.o. MRN: [id number]    Subjective:  Subjective  HPI STORMI GOUDEAU presents for f/u dm, chol and htn.    HPI HYPERTENSION   Blood pressure range-not checking   Chest pain- no      Dyspnea- no Lightheadedness- no   Edema- no  Other side effects - no   Medication compliance: good Low salt diet- yes    DIABETES    Blood Sugar ranges-good per pt   Polyuria- no New Visual problems- no  Hypoglycemic symptoms- no  Other side effects-no Medication compliance - good Last eye exam- due Foot exam- today   HYPERLIPIDEMIA  Medication compliance- good RUQ pain- no  Muscle aches- no Other side effects-no     Review of Systems  Constitutional: Negative for appetite change, diaphoresis, fatigue and unexpected weight change.  Eyes: Negative for pain, redness and visual disturbance.  Respiratory: Negative for cough, chest tightness, shortness of breath and wheezing.   Cardiovascular: Negative for chest pain, palpitations and leg swelling.  Endocrine: Negative for cold intolerance, heat intolerance, polydipsia, polyphagia and polyuria.  Genitourinary: Negative for difficulty urinating, dysuria and frequency.  Neurological: Negative for dizziness, light-headedness, numbness and headaches.    History Past Medical History:  Diagnosis Date  . Anxiety   . Back pain   . Constipation   . Depression   . Diabetes mellitus without complication ([place])   . Gallbladder problem   . Heart murmur   . Hyperlipidemia   . Hypertension   . Prediabetes   . Sleep apnea    On CPAP  . Spider bite   . Swelling   . Thyroid disease    hypothyroidism    She has a past surgical history that includes Cholecystectomy; Dilation and curettage of uterus; Tonsillectomy; fibroids scrapping; and Esophagogastroduodenoscopy (egd) with propofol (N/A, 09/05/2017).   Her family history includes Arthritis in her unknown relative; Breast cancer in her  maternal aunt and paternal grandmother; Cancer in her maternal aunt and maternal aunt; Depression in her mother and unknown relative; Diabetes in her maternal aunt and mother; Hyperlipidemia in her unknown relative; Lung cancer in her maternal aunt; Thyroid disease in her mother.She reports that she quit smoking about 28 years ago. Her smoking use included cigarettes. She has a 1.50 pack-year smoking history. She has never used smokeless tobacco. She reports current alcohol use. She reports that she does not use drugs.  Current Outpatient Medications on File Prior to Visit  Medication Sig Dispense Refill  . Calcium Carbonate-Vitamin D (CALCIUM 500 + D PO) Take 1 tablet by mouth 2 (two) times daily.      . Multiple Vitamin (MULTIVITAMIN) tablet Take 1 tablet by mouth daily.       No current facility-administered medications on file prior to visit.     Objective:  Objective  Physical Exam Vitals and nursing note reviewed.  Constitutional:      Appearance: She is well-developed.  HENT:     Head: Normocephalic and atraumatic.  Eyes:     Conjunctiva/sclera: Conjunctivae normal.  Neck:     Thyroid: No thyromegaly.     Vascular: No carotid bruit or JVD.  Cardiovascular:     Rate and Rhythm: Normal rate and regular rhythm.     Heart sounds: Normal heart sounds. No murmur.  Pulmonary:     Effort: Pulmonary effort is normal. No respiratory distress.     Breath sounds: Normal breath  sounds. No wheezing or rales.  Chest:     Chest wall: No tenderness.  Musculoskeletal:     Cervical back: Normal range of motion and neck supple.  Neurological:     Mental Status: She is alert and oriented to person, place, and time.    Diabetic Foot Exam - Simple   Simple Foot Form Diabetic Foot exam was performed with the following findings: Yes 05/14/2019  4:42 PM  Visual Inspection No deformities, no ulcerations, no other skin breakdown bilaterally: Yes Sensation Testing Intact to touch and monofilament  testing bilaterally: Yes Pulse Check Posterior Tibialis and Dorsalis pulse intact bilaterally: Yes Comments     BP 124/80 (BP Location: Left Arm, Patient Position: Sitting, Cuff Size: Large)   Pulse 83   Temp (!) 97.1 F (36.2 C) (Temporal)   Resp 18   Ht 5\' 5"  (1.651 m)   Wt 256 lb 9.6 oz (116.4 kg)   SpO2 97%   BMI 42.70 kg/m  Wt Readings from Last 3 Encounters:  05/14/19 256 lb 9.6 oz (116.4 kg)  12/25/18 249 lb (112.9 kg)  12/08/18 256 lb (116.1 kg)     Lab Results  Component Value Date   WBC 6.2 01/01/2018   HGB 12.5 01/01/2018   HCT 38.0 01/01/2018   PLT 305 01/01/2018   GLUCOSE 124 (H) 10/06/2018   CHOL 115 04/14/2018   TRIG 75 04/14/2018   HDL 40 04/14/2018   LDLDIRECT 193.3 11/06/2012   LDLCALC 60 04/14/2018   ALT 19 10/06/2018   AST 28 10/06/2018   NA 142 10/06/2018   K 5.1 10/06/2018   CL 103 10/06/2018   CREATININE 0.88 10/06/2018   BUN 26 10/06/2018   CO2 26 10/06/2018   TSH 2.240 10/06/2018   HGBA1C 6.2 (H) 10/06/2018   MICROALBUR 1.4 09/12/2015    MM 3D SCREEN BREAST BILATERAL  Result Date: 10/18/2017 CLINICAL DATA:  Screening. EXAM: DIGITAL SCREENING BILATERAL MAMMOGRAM WITH TOMO AND CAD COMPARISON:  Previous exam(s). ACR Breast Density Category a: The breast tissue is almost entirely fatty. FINDINGS: There are no findings suspicious for malignancy. Images were processed with CAD. IMPRESSION: No mammographic evidence of malignancy. A result letter of this screening mammogram will be mailed directly to the patient. RECOMMENDATION: Screening mammogram in one year. (Code:SM-B-01Y) BI-RADS CATEGORY  1: Negative. Electronically Signed   By: Lajean Manes M.D.   On: 10/18/2017 10:33     Assessment & Plan:  Plan  I have discontinued Sherrlyn Hock vitamin E. I have also changed her atorvastatin, fenofibrate, lisinopril, and Vitamin D (Ergocalciferol). Additionally, I am having her maintain her Calcium Carbonate-Vitamin D (CALCIUM 500 + D PO),  multivitamin, levothyroxine, metFORMIN, and venlafaxine XR.  Meds ordered this encounter  Medications  . atorvastatin (LIPITOR) 40 MG tablet    Sig: Take 1 tablet (40 mg total) by mouth daily.    Dispense:  90 tablet    Refill:  1    Requesting 1 year supply  . fenofibrate 160 MG tablet    Sig: Take 1 tablet (160 mg total) by mouth daily.    Dispense:  90 tablet    Refill:  1  . levothyroxine (SYNTHROID) 112 MCG tablet    Sig: Take 1 tablet (112 mcg total) by mouth daily before breakfast.    Dispense:  90 tablet    Refill:  1  . lisinopril (ZESTRIL) 5 MG tablet    Sig: Take 1 tablet (5 mg total) by mouth daily.  Dispense:  90 tablet    Refill:  1  . metFORMIN (GLUCOPHAGE) 500 MG tablet    Sig: TAKE 1 TABLET BY MOUTH EVERY DAY WITH BREAKFAST    Dispense:  90 tablet    Refill:  1  . venlafaxine XR (EFFEXOR-XR) 150 MG 24 hr capsule    Sig: Take 1 capsule (150 mg total) by mouth daily.    Dispense:  90 capsule    Refill:  1  . Vitamin D, Ergocalciferol, (DRISDOL) 1.25 MG (50000 UNIT) CAPS capsule    Sig: Take 1 capsule (50,000 Units total) by mouth every 7 (seven) days.    Dispense:  12 capsule    Refill:  2    Problem List Items Addressed This Visit      Unprioritized   Class 3 severe obesity with serious comorbidity and body mass index (BMI) of 40.0 to 44.9 in adult [facility])    D/w pt diet and exercise She is not going back to healthy weight and wellness at this time       Relevant Medications   metFORMIN (GLUCOPHAGE) 500 MG tablet   Hyperlipidemia LDL goal <100    Encouraged heart healthy diet, increase exercise, avoid trans fats, consider a krill oil cap daily      Relevant Medications   atorvastatin (LIPITOR) 40 MG tablet   fenofibrate 160 MG tablet   lisinopril (ZESTRIL) 5 MG tablet   Hypothyroidism    con't meds      Relevant Medications   levothyroxine (SYNTHROID) 112 MCG tablet   Other Relevant Orders   TSH   Type 2 diabetes mellitus with  hyperglycemia, without long-term current use of insulin (HCC)    hgba1c to be checked , minimize simple carbs. Increase exercise as tolerated. Continue current meds       Relevant Medications   atorvastatin (LIPITOR) 40 MG tablet   lisinopril (ZESTRIL) 5 MG tablet   metFORMIN (GLUCOPHAGE) 500 MG tablet   Other Relevant Orders   Hemoglobin A1c   Comprehensive metabolic panel    Other Visit Diagnoses    Hyperlipidemia, unspecified hyperlipidemia type       Relevant Medications   atorvastatin (LIPITOR) 40 MG tablet   fenofibrate 160 MG tablet   lisinopril (ZESTRIL) 5 MG tablet   Other Relevant Orders   Lipid panel   Depression, major, single episode, moderate (HCC)       Relevant Medications   venlafaxine XR (EFFEXOR-XR) 150 MG 24 hr capsule   Vitamin D deficiency       Relevant Medications   Vitamin D, Ergocalciferol, (DRISDOL) 1.25 MG (50000 UNIT) CAPS capsule      Follow-up: Return in about 6 months (around 11/14/2019), or if symptoms worsen or fail to improve, for annual exam, fasting.  [name], DO

## 2019-05-14 NOTE — Assessment & Plan Note (Signed)
con't meds 

## 2019-05-14 NOTE — Assessment & Plan Note (Signed)
Encouraged heart healthy diet, increase exercise, avoid trans fats, consider a krill oil cap daily 

## 2019-05-14 NOTE — Assessment & Plan Note (Signed)
hgba1c to be checked, minimize simple carbs. Increase exercise as tolerated. Continue current meds  

## 2019-05-14 NOTE — Assessment & Plan Note (Signed)
D/w pt diet and exercise She is not going back to healthy weight and wellness at this time

## 2019-05-14 NOTE — Patient Instructions (Signed)
Carbohydrate Counting for Diabetes Mellitus, Adult  Carbohydrate counting is a method of keeping track of how many carbohydrates you eat. Eating carbohydrates naturally increases the amount of sugar (glucose) in the blood. Counting how many carbohydrates you eat helps keep your blood glucose within normal limits, which helps you manage your diabetes (diabetes mellitus). It is important to know how many carbohydrates you can safely have in each meal. This is different for every person. A diet and nutrition specialist (registered dietitian) can help you make a meal plan and calculate how many carbohydrates you should have at each meal and snack. Carbohydrates are found in the following foods:  Grains, such as breads and cereals.  Dried beans and soy products.  Starchy vegetables, such as potatoes, peas, and corn.  Fruit and fruit juices.  Milk and yogurt.  Sweets and snack foods, such as cake, cookies, candy, chips, and soft drinks. How do I count carbohydrates? There are two ways to count carbohydrates in food. You can use either of the methods or a combination of both. Reading "Nutrition Facts" on packaged food The "Nutrition Facts" list is included on the labels of almost all packaged foods and beverages in the [place] It includes:  The serving size.  Information about nutrients in each serving, including the grams (g) of carbohydrate per serving. To use the "Nutrition Facts":  Decide how many servings you will have.  Multiply the number of servings by the number of carbohydrates per serving.  The resulting number is the total amount of carbohydrates that you will be having. Learning standard serving sizes of other foods When you eat carbohydrate foods that are not packaged or do not include "Nutrition Facts" on the label, you need to measure the servings in order to count the amount of carbohydrates:  Measure the foods that you will eat with a food scale or measuring cup, if  needed.  Decide how many standard-size servings you will eat.  Multiply the number of servings by 15. Most carbohydrate-rich foods have about 15 g of carbohydrates per serving. ? For example, if you eat 8 oz (170 g) of strawberries, you will have eaten 2 servings and 30 g of carbohydrates (2 servings x 15 g = 30 g).  For foods that have more than one food mixed, such as soups and casseroles, you must count the carbohydrates in each food that is included. The following list contains standard serving sizes of common carbohydrate-rich foods. Each of these servings has about 15 g of carbohydrates:   hamburger bun or  English muffin.   oz (15 mL) syrup.   oz (14 g) jelly.  1 slice of bread.  1 six-inch tortilla.  3 oz (85 g) cooked rice or pasta.  4 oz (113 g) cooked dried beans.  4 oz (113 g) starchy vegetable, such as peas, corn, or potatoes.  4 oz (113 g) hot cereal.  4 oz (113 g) mashed potatoes or  of a large baked potato.  4 oz (113 g) canned or frozen fruit.  4 oz (120 mL) fruit juice.  4-6 crackers.  6 chicken nuggets.  6 oz (170 g) unsweetened dry cereal.  6 oz (170 g) plain fat-free yogurt or yogurt sweetened with artificial sweeteners.  8 oz (240 mL) milk.  8 oz (170 g) fresh fruit or one small piece of fruit.  24 oz (680 g) popped popcorn. Example of carbohydrate counting Sample meal  3 oz (85 g) chicken breast.  6 oz (170 g)   brown rice.  4 oz (113 g) corn.  8 oz (240 mL) milk.  8 oz (170 g) strawberries with sugar-free whipped topping. Carbohydrate calculation 1. Identify the foods that contain carbohydrates: ? Rice. ? Corn. ? Milk. ? Strawberries. 2. Calculate how many servings you have of each food: ? 2 servings rice. ? 1 serving corn. ? 1 serving milk. ? 1 serving strawberries. 3. Multiply each number of servings by 15 g: ? 2 servings rice x 15 g = 30 g. ? 1 serving corn x 15 g = 15 g. ? 1 serving milk x 15 g = 15 g. ? 1  serving strawberries x 15 g = 15 g. 4. Add together all of the amounts to find the total grams of carbohydrates eaten: ? 30 g + 15 g + 15 g + 15 g = 75 g of carbohydrates total. Summary  Carbohydrate counting is a method of keeping track of how many carbohydrates you eat.  Eating carbohydrates naturally increases the amount of sugar (glucose) in the blood.  Counting how many carbohydrates you eat helps keep your blood glucose within normal limits, which helps you manage your diabetes.  A diet and nutrition specialist (registered dietitian) can help you make a meal plan and calculate how many carbohydrates you should have at each meal and snack. This information is not intended to replace advice given to you by your health care provider. Make sure you discuss any questions you have with your health care provider. Document Revised: 08/30/2016 Document Reviewed: 07/20/2015 Elsevier Patient Education  2020 Elsevier Inc.  

## 2019-05-15 LAB — TSH: TSH: 0.46 u[IU]/mL (ref 0.35–4.50)

## 2019-05-15 LAB — COMPREHENSIVE METABOLIC PANEL
ALT: 17 U/L (ref 0–35)
AST: 20 U/L (ref 0–37)
Albumin: 4.2 g/dL (ref 3.5–5.2)
Alkaline Phosphatase: 43 U/L (ref 39–117)
BUN: 21 mg/dL (ref 6–23)
CO2: 30 mEq/L (ref 19–32)
Calcium: 9.8 mg/dL (ref 8.4–10.5)
Chloride: 105 mEq/L (ref 96–112)
Creatinine, Ser: 0.9 mg/dL (ref 0.40–1.20)
GFR: 61.98 mL/min (ref >60.00)
Glucose, Bld: 152 mg/dL — ABNORMAL HIGH (ref 70–99)
Potassium: 4.4 mEq/L (ref 3.5–5.1)
Sodium: 141 mEq/L (ref 135–145)
Total Bilirubin: 0.3 mg/dL (ref 0.2–1.2)
Total Protein: 6.3 g/dL (ref 6.0–8.3)

## 2019-05-15 LAB — LIPID PANEL
Cholesterol: 130 mg/dL (ref 0–200)
HDL: 36.7 mg/dL — ABNORMAL LOW (ref >39.00)
LDL Cholesterol: 65 mg/dL (ref 0–99)
NonHDL: 93.05
Total CHOL/HDL Ratio: 4
Triglycerides: 141 mg/dL (ref 0.0–149.0)
VLDL: 28.2 mg/dL (ref 0.0–40.0)

## 2019-05-15 LAB — HEMOGLOBIN A1C: Hgb A1c MFr Bld: 6.8 % — ABNORMAL HIGH (ref 4.6–6.5)

## 2019-05-16 ENCOUNTER — Other Ambulatory Visit: Payer: Self-pay | Admitting: Family Medicine

## 2019-05-16 DIAGNOSIS — E1165 Type 2 diabetes mellitus with hyperglycemia: Secondary | ICD-10-CM

## 2019-05-23 ENCOUNTER — Other Ambulatory Visit: Payer: Self-pay | Admitting: Family Medicine

## 2019-05-23 DIAGNOSIS — E038 Other specified hypothyroidism: Secondary | ICD-10-CM

## 2019-05-31 ENCOUNTER — Other Ambulatory Visit: Payer: Self-pay | Admitting: Family Medicine

## 2019-05-31 DIAGNOSIS — F321 Major depressive disorder, single episode, moderate: Secondary | ICD-10-CM

## 2019-07-21 ENCOUNTER — Other Ambulatory Visit: Payer: Self-pay | Admitting: Family Medicine

## 2019-07-21 DIAGNOSIS — E559 Vitamin D deficiency, unspecified: Secondary | ICD-10-CM

## 2019-07-26 ENCOUNTER — Encounter: Payer: Self-pay | Admitting: Internal Medicine

## 2019-07-28 ENCOUNTER — Encounter: Payer: Self-pay | Admitting: Internal Medicine

## 2019-07-28 ENCOUNTER — Other Ambulatory Visit: Payer: Self-pay

## 2019-07-28 ENCOUNTER — Ambulatory Visit: Payer: Medicare Other | Admitting: Internal Medicine

## 2019-07-28 VITALS — BP 134/70 | HR 80 | Temp 98.7°F | Ht 65.0 in | Wt 261.6 lb

## 2019-07-28 DIAGNOSIS — Z6841 Body Mass Index (BMI) 40.0 and over, adult: Secondary | ICD-10-CM | POA: Diagnosis not present

## 2019-07-28 DIAGNOSIS — G4733 Obstructive sleep apnea (adult) (pediatric): Secondary | ICD-10-CM

## 2019-07-28 NOTE — Assessment & Plan Note (Signed)
Continues to benefit with good compliance and control. We discussed full face mask, increased humidity and Biotene as ways to deal with dry mouth on waking in the morning.  Plan- continue 10-20

## 2019-07-28 NOTE — Assessment & Plan Note (Deleted)
No longer working with [facility] Weight program. Encouraged to keep trying to lose.

## 2019-07-28 NOTE — Assessment & Plan Note (Signed)
No longer working with Healthy Weight program. Encouraged to keep trying to lose.

## 2019-07-28 NOTE — Progress Notes (Signed)
HPI female former smoker followed for OSA, Cough, complicated by obesity, hypothyroid, GERD NPSG 08/03/94 RDI/AHI 49/hr, weight was 285 pounds  ----------------------------------------------------------------------------   07/28/2018- 69 year old female former smoker followed for OSA, Cough, complicated by obesity, hypothyroid, GERD CPAP auto 10-20/[employer] Download compliance 97%, AHI 6/ hr -----OSA on Cpap no complaints She is satisfied with CPAP. No concerns. Discussed goals. Spring pollen rhinitis with nasal congestion  07/28/19- 69 year old female former smoker followed for OSA, Cough, complicated by Obesity, hypothyroid, GERD, DM2, Hyperlipidemia,  CPAP auto 10-20/[employer] Download compliance 100%, AHI 5.2/ hr Body weight today 261 lbs -----using C-Pap nightly  For the past year has been waking with dry mouth. Nasal mask. Discussed chin strap, Biotene, adjust humidifier.  Has had 1st Phizer Covax.   ROS-see HPI   + = positive Constitutional:   No-   weight loss, night sweats, fevers, chills, fatigue, lassitude. HEENT:   No-  headaches, difficulty swallowing, tooth/dental problems, sore throat,       No-  sneezing, itching, ear ache, +nasal congestion, post nasal drip,  CV:  No-   chest pain, orthopnea, PND, swelling in lower extremities, anasarca,  dizziness, palpitations Resp: No-   shortness of breath with exertion or at rest.               productive cough,   non-productive cough,  No- coughing up of blood.              No-   change in color of mucus.  No- wheezing.   Skin: No-   rash or lesions. GI:  +heartburn, indigestion, abdominal pain, nausea, vomiting, GU: . MS:  No-   joint pain or swelling.   Neuro-     nothing unusual Psych:  No- change in mood or affect. No depression or anxiety.  No memory loss.  OBJ- Physical Exam General- Alert, Oriented, Affect-appropriate, Distress- none acute, + obese Skin- rash-none, lesions- none, excoriation- none Lymphadenopathy-  none Head- atraumatic            Eyes- Gross vision intact, PERRLA, conjunctivae and secretions clear            Ears- Hearing, canals-normal            Nose- Clear, no-Septal dev, mucus, polyps, erosion, perforation             Throat- Mallampati III-IV , mucosa clear , drainage- none, tonsils- atrophic Neck- flexible , trachea midline, no stridor , thyroid nl, carotid no bruit Chest - symmetrical excursion , unlabored           Heart/CV- RRR , +trace AS murmur , no gallop  , no rub, nl s1 s2                           - JVD- none , edema- none, stasis changes- none, varices- none           Lung- clear, wheeze- none, cough- none , dullness-none, rub- none           Chest wall-  Abd-  Br/ Gen/ Rectal- Not done, not indicated Extrem- cyanosis- none, clubbing, none, atrophy- none, strength- nl Neuro- grossly intact to observation   .

## 2019-07-28 NOTE — Patient Instructions (Signed)
We can continue CPAP auto 10--20, mask of choice, humidifier, supplies, airView/ card  Check your owner's manual for how to adjust your humidifier, and try turning it up just a little to see if that helps dry mouth.  You can talk with [employer] about trying a full-face mask that covers nose and mouth.  Order- DME [employer]- please provide chin strap Please call if we can help

## 2019-07-30 DIAGNOSIS — G4733 Obstructive sleep apnea (adult) (pediatric): Secondary | ICD-10-CM | POA: Diagnosis not present

## 2019-08-03 DIAGNOSIS — R0982 Postnasal drip: Secondary | ICD-10-CM

## 2019-08-03 HISTORY — DX: Postnasal drip: R09.82

## 2019-10-02 ENCOUNTER — Other Ambulatory Visit: Payer: Self-pay | Admitting: Family Medicine

## 2019-10-02 DIAGNOSIS — E038 Other specified hypothyroidism: Secondary | ICD-10-CM

## 2019-10-02 DIAGNOSIS — F321 Major depressive disorder, single episode, moderate: Secondary | ICD-10-CM

## 2019-10-02 DIAGNOSIS — E559 Vitamin D deficiency, unspecified: Secondary | ICD-10-CM

## 2019-10-07 ENCOUNTER — Other Ambulatory Visit: Payer: Self-pay | Admitting: Family Medicine

## 2019-10-07 DIAGNOSIS — E785 Hyperlipidemia, unspecified: Secondary | ICD-10-CM

## 2019-10-16 DIAGNOSIS — E119 Type 2 diabetes mellitus without complications: Secondary | ICD-10-CM | POA: Diagnosis not present

## 2019-10-16 DIAGNOSIS — H52203 Unspecified astigmatism, bilateral: Secondary | ICD-10-CM | POA: Diagnosis not present

## 2019-10-16 DIAGNOSIS — H5203 Hypermetropia, bilateral: Secondary | ICD-10-CM | POA: Diagnosis not present

## 2019-10-16 LAB — HM DIABETES EYE EXAM

## 2019-10-28 ENCOUNTER — Other Ambulatory Visit: Payer: Self-pay | Admitting: Family Medicine

## 2019-10-28 DIAGNOSIS — E1165 Type 2 diabetes mellitus with hyperglycemia: Secondary | ICD-10-CM

## 2019-10-29 ENCOUNTER — Other Ambulatory Visit: Payer: Self-pay | Admitting: Family Medicine

## 2019-10-29 DIAGNOSIS — E1165 Type 2 diabetes mellitus with hyperglycemia: Secondary | ICD-10-CM

## 2019-12-16 ENCOUNTER — Other Ambulatory Visit: Payer: Self-pay | Admitting: Family Medicine

## 2019-12-16 DIAGNOSIS — E785 Hyperlipidemia, unspecified: Secondary | ICD-10-CM

## 2020-01-04 ENCOUNTER — Encounter (HOSPITAL_BASED_OUTPATIENT_CLINIC_OR_DEPARTMENT_OTHER): Payer: Self-pay

## 2020-01-04 ENCOUNTER — Other Ambulatory Visit: Payer: Self-pay

## 2020-01-04 ENCOUNTER — Ambulatory Visit (HOSPITAL_BASED_OUTPATIENT_CLINIC_OR_DEPARTMENT_OTHER)
Admission: RE | Admit: 2020-01-04 | Discharge: 2020-01-04 | Disposition: A | Discharge status: Home / Self Care | Payer: Medicare Other | Source: Ambulatory Visit | Attending: Family Medicine | Admitting: Family Medicine

## 2020-01-04 DIAGNOSIS — Z1231 Encounter for screening mammogram for malignant neoplasm of breast: Secondary | ICD-10-CM

## 2020-02-15 ENCOUNTER — Telehealth: Payer: Self-pay

## 2020-02-15 NOTE — Telephone Encounter (Signed)
Nurse Assessment Nurse: Nunzio Cory, RN, Sherrie Date/Time Amy Mahoney Time): 02/11/2020 4:28:15 PM Confirm and document reason for call. If symptomatic, describe symptoms. ---Caller states having low grade fever (99.2 oral), +nausea, +cough, +nasal congestion. Symptoms started about 4 days ago. Father has tested + for COVID, with same symptoms as she has. Not vaccinated. Does the patient have any new or worsening symptoms? ---Yes Will a triage be completed? ---Yes Related visit to physician within the last 2 weeks? ---No Does the PT have any chronic conditions? (i.e. diabetes, asthma, this includes High risk factors for pregnancy, etc.) ---Yes List chronic conditions. ---pre diabetic, Thyroid issues, High Cholesterol Is this a behavioral health or substance abuse call? ---No Guidelines Guideline Title Affirmed Question Affirmed Notes Nurse Date/Time (Eastern Time) COVID-19 - Diagnosed or Suspected [1] COVID-19 infection suspected by caller or triager AND [2] mild symptoms (cough, fever, or others) AND [3] has not gotten tested yet Nunzio Cory, Charity fundraiser, Sherrie 02/11/2020 4:32:32 PM Disp. Time Amy Mahoney Time) Disposition Final User 02/11/2020 4:24:42 PM Send To RN Personal Cumesty, RN, Barkley Boards NOTE: All timestamps contained within this report are represented as [place] Standard Time. CONFIDENTIALTY NOTICE: This fax transmission is intended only for the addressee. It contains information that is legally privileged, confidential or otherwise protected from use or disclosure. If you are not the intended recipient, you are strictly prohibited from reviewing, disclosing, copying using or disseminating any of this information or taking any action in reliance on or regarding this information. If you have received this fax in error, please notify us immediately by telephone so that we can arrange for its return to [name]. Phone: [phone], Toll-Free: [phone], Fax: [phone] Page: 2 of 2 Call Id:  [id number] 02/11/2020 4:38:31 PM Call PCP when Office is Open Yes Nunzio Cory, RN, Sherrie Caller Disagree/Comply Comply Caller Understands Yes PreDisposition InappropriateToAsk Care Advice Given Per Guideline CALL PCP WHEN OFFICE IS OPEN: * You need to discuss this with your doctor (or NP/PA) within the next few days. * Cough: Use cough drops. * Fever: For fever over 101 F (38.3 C), take acetaminophen every 4 to 6 hours (Adults 650 mg) OR ibuprofen every 6 to 8 hours (Adults 400 mg). Before taking any medicine, read all the instructions on the package. Do not take aspirin unless your doctor has prescribed it for you. COUGH MEDICINES: * COUGH DROPS: Over-the-counter cough drops can help a lot, especially for mild coughs. They soothe an irritated throat and remove the tickle sensation in the back of the throat. Cough drops are easy to carry with you. * HOME REMEDY - HARD CANDY: Hard candy works just as well as over-the-counter cough drops. People who have diabetes should use sugar-free candy. * HOME REMEDY - HONEY: This old home remedy has been shown to help decrease coughing at night. The adult dosage is 2 teaspoons (10 ml) at bedtime. Honey should not be given to infants under one year of age. COUGHING SPELLS: * Drink warm fluids. Inhale warm mist. This can help relax the airway and also loosen up phlegm. CALL BACK IF: * Fever lasts over 3 days * Chest pain or difficulty breathing occurs * You become worse Comments User: Holland Commons, RN Date/Time (Eastern Time): 02/11/2020 4:38:30 PM Instructed that there were home COVID tests. States their [facility] had [facility] testing and will go there since office is closed now until Monday. Referrals GO TO FACILITY UNDECIDED

## 2020-02-15 NOTE — Telephone Encounter (Signed)
Pt was advised by nurse for testing by after hours nurse.

## 2020-03-10 ENCOUNTER — Other Ambulatory Visit: Payer: Self-pay | Admitting: Family Medicine

## 2020-03-10 DIAGNOSIS — E785 Hyperlipidemia, unspecified: Secondary | ICD-10-CM

## 2020-03-19 DIAGNOSIS — G4733 Obstructive sleep apnea (adult) (pediatric): Secondary | ICD-10-CM | POA: Diagnosis not present

## 2020-04-14 ENCOUNTER — Encounter: Payer: Self-pay | Admitting: Family Medicine

## 2020-05-31 ENCOUNTER — Other Ambulatory Visit: Payer: Self-pay | Admitting: Family Medicine

## 2020-05-31 DIAGNOSIS — E785 Hyperlipidemia, unspecified: Secondary | ICD-10-CM

## 2020-07-11 ENCOUNTER — Other Ambulatory Visit: Payer: Self-pay

## 2020-07-11 ENCOUNTER — Encounter: Payer: Self-pay | Admitting: Family Medicine

## 2020-07-11 ENCOUNTER — Ambulatory Visit (INDEPENDENT_AMBULATORY_CARE_PROVIDER_SITE_OTHER): Payer: Medicare Other | Admitting: Family Medicine

## 2020-07-11 ENCOUNTER — Ambulatory Visit (HOSPITAL_BASED_OUTPATIENT_CLINIC_OR_DEPARTMENT_OTHER)
Admission: RE | Admit: 2020-07-11 | Discharge: 2020-07-11 | Disposition: A | Discharge status: Home / Self Care | Payer: Medicare Other | Source: Ambulatory Visit | Attending: Family Medicine | Admitting: Family Medicine

## 2020-07-11 ENCOUNTER — Telehealth: Payer: Self-pay

## 2020-07-11 VITALS — BP 100/70 | HR 86 | Temp 98.7°F | Resp 20 | Ht 65.0 in | Wt 262.8 lb

## 2020-07-11 DIAGNOSIS — F419 Anxiety disorder, unspecified: Secondary | ICD-10-CM

## 2020-07-11 DIAGNOSIS — E1169 Type 2 diabetes mellitus with other specified complication: Secondary | ICD-10-CM

## 2020-07-11 DIAGNOSIS — E038 Other specified hypothyroidism: Secondary | ICD-10-CM | POA: Diagnosis not present

## 2020-07-11 DIAGNOSIS — F418 Other specified anxiety disorders: Secondary | ICD-10-CM

## 2020-07-11 DIAGNOSIS — I10 Essential (primary) hypertension: Secondary | ICD-10-CM

## 2020-07-11 DIAGNOSIS — R0789 Other chest pain: Secondary | ICD-10-CM | POA: Insufficient documentation

## 2020-07-11 DIAGNOSIS — E1165 Type 2 diabetes mellitus with hyperglycemia: Secondary | ICD-10-CM

## 2020-07-11 DIAGNOSIS — E785 Hyperlipidemia, unspecified: Secondary | ICD-10-CM | POA: Diagnosis not present

## 2020-07-11 HISTORY — DX: Essential (primary) hypertension: I10

## 2020-07-11 HISTORY — DX: Other chest pain: R07.89

## 2020-07-11 HISTORY — DX: Other specified anxiety disorders: F41.8

## 2020-07-11 HISTORY — DX: Type 2 diabetes mellitus with other specified complication: E11.69

## 2020-07-11 LAB — CBC WITH DIFFERENTIAL/PLATELET
Basophils Absolute: 0.1 10*3/uL (ref 0.0–0.1)
Basophils Relative: 0.9 % (ref 0.0–3.0)
Eosinophils Absolute: 0.5 10*3/uL (ref 0.0–0.7)
Eosinophils Relative: 8.9 % — ABNORMAL HIGH (ref 0.0–5.0)
HCT: 39.1 % (ref 36.0–46.0)
Hemoglobin: 13 g/dL (ref 12.0–15.0)
Lymphocytes Relative: 27.2 % (ref 12.0–46.0)
Lymphs Abs: 1.6 10*3/uL (ref 0.7–4.0)
MCHC: 33.2 g/dL (ref 30.0–36.0)
MCV: 90 fl (ref 78.0–100.0)
Monocytes Absolute: 0.4 10*3/uL (ref 0.1–1.0)
Monocytes Relative: 7.7 % (ref 3.0–12.0)
Neutro Abs: 3.2 10*3/uL (ref 1.4–7.7)
Neutrophils Relative %: 55.3 % (ref 43.0–77.0)
Platelets: 294 10*3/uL (ref 150.0–400.0)
RBC: 4.34 Mil/uL (ref 3.87–5.11)
RDW: 13.3 % (ref 11.5–15.5)
WBC: 5.8 10*3/uL (ref 4.0–10.5)

## 2020-07-11 LAB — COMPREHENSIVE METABOLIC PANEL
ALT: 24 U/L (ref 0–35)
AST: 22 U/L (ref 0–37)
Albumin: 4.1 g/dL (ref 3.5–5.2)
Alkaline Phosphatase: 64 U/L (ref 39–117)
BUN: 17 mg/dL (ref 6–23)
CO2: 27 mEq/L (ref 19–32)
Calcium: 9.9 mg/dL (ref 8.4–10.5)
Chloride: 100 mEq/L (ref 96–112)
Creatinine, Ser: 0.83 mg/dL (ref 0.40–1.20)
GFR: 71.27 mL/min (ref >60.00)
Glucose, Bld: 563 mg/dL (ref 70–99)
Potassium: 4.8 mEq/L (ref 3.5–5.1)
Sodium: 135 mEq/L (ref 135–145)
Total Bilirubin: 0.4 mg/dL (ref 0.2–1.2)
Total Protein: 6.7 g/dL (ref 6.0–8.3)

## 2020-07-11 LAB — MICROALBUMIN / CREATININE URINE RATIO
Creatinine,U: 20.1 mg/dL
Microalb Creat Ratio: 3.5 mg/g (ref 0.0–30.0)
Microalb, Ur: <0.7 mg/dL (ref 0.0–1.9)

## 2020-07-11 LAB — LIPID PANEL
Cholesterol: 148 mg/dL (ref 0–200)
HDL: 34.5 mg/dL — ABNORMAL LOW (ref >39.00)
LDL Cholesterol: 82 mg/dL (ref 0–99)
NonHDL: 113.88
Total CHOL/HDL Ratio: 4
Triglycerides: 158 mg/dL — ABNORMAL HIGH (ref 0.0–149.0)
VLDL: 31.6 mg/dL (ref 0.0–40.0)

## 2020-07-11 LAB — HEMOGLOBIN A1C: Hgb A1c MFr Bld: 14.3 % — ABNORMAL HIGH (ref 4.6–6.5)

## 2020-07-11 LAB — TSH: TSH: 2.29 u[IU]/mL (ref 0.35–4.50)

## 2020-07-11 MED ORDER — ALPRAZOLAM 0.25 MG PO TABS: 0.2500 mg | ORAL_TABLET | Freq: Two times a day (BID) | ORAL | 0 refills | Status: DC | PRN

## 2020-07-11 MED ORDER — BLOOD GLUCOSE METER KIT: PACK | 0 refills | Status: DC

## 2020-07-11 MED ORDER — INSULIN DEGLUDEC 100 UNIT/ML ~~LOC~~ SOPN: 10.0000 [IU] | PEN_INJECTOR | Freq: Every day | SUBCUTANEOUS | 1 refills | Status: DC

## 2020-07-11 NOTE — Assessment & Plan Note (Signed)
Tolerating statin, encouraged heart healthy diet, avoid trans fats, minimize simple carbs and saturated fats. Increase exercise as tolerated 

## 2020-07-11 NOTE — Telephone Encounter (Signed)
CRITICAL VALUE STICKER  CRITICAL VALUE: Glucose 563  RECEIVER (on-site recipient of call): Amy Mahoney  DATE & TIME NOTIFIED: 07/11/2020 at 1:07 pm  MESSENGER (representative from lab): Hope MD NOTIFIED: [name], DO  TIME OF NOTIFICATION: 1:13 pm   RESPONSE:

## 2020-07-11 NOTE — Assessment & Plan Note (Signed)
cxr  ekg---  No change from previous 12/2017 Check echo May be anxiety== rto if occurs again or go to ER

## 2020-07-11 NOTE — Assessment & Plan Note (Signed)
Pt is on effexor Chest pressure may be anxiety rx xanax ---  For prn use

## 2020-07-11 NOTE — Assessment & Plan Note (Signed)
Well controlled, no changes to meds. Encouraged heart healthy diet such as the DASH diet and exercise as tolerated.  °

## 2020-07-11 NOTE — Progress Notes (Signed)
Patient ID: Amy Mahoney, female    DOB: 1951/01/21  Age: 70 y.o. MRN: [id number]    Subjective:  Subjective  HPI Amy Mahoney presents for an office visit today. She complains of chest heaviness and anxiety. She states that she has been dealing with family issues. She describes the heaviness as someone sitting on her chest. She notes that she had a chest heaviness episode on 07/11/2020 when she was walking her dog. She endorses having episodes of chest heaviness when she is goes to bed and after standing up.  She notes that when she concentrate on staying calm and her breathing, the episode last for 5 minute. She also complains of chest and back pressure. She states that the chest pressure has affect her breathing. She also complains of cough. She describes the cough as a tickle. She notes that the cough worsen after/while drinking cold liquids, with anger, and after movement. She denies, fever, abdominal pain, chills, sore throat, dysuria, urinary incontinence, HA, or N/V/D at this time.  Diabetes---home readings 90-120 No other complaints We are f/u on dm, chol and bp also and getting labs today  Review of Systems  Constitutional: Negative for activity change, appetite change, chills, fatigue, fever and unexpected weight change.  HENT: Negative for ear pain, rhinorrhea, sinus pressure, sinus pain, sore throat and tinnitus.   Eyes: Negative for pain.  Respiratory: Positive for cough and shortness of breath. Negative for wheezing.        (+) chest heaviness   Cardiovascular: Negative for chest pain and palpitations.       (+) chest pressure    Gastrointestinal: Negative for abdominal pain, anal bleeding, constipation, diarrhea, nausea and vomiting.  Genitourinary: Negative for flank pain.  Musculoskeletal: Negative for neck pain.       (+) back pressure   Skin: Negative for rash.  Neurological: Negative for seizures, weakness, light-headedness, numbness and headaches.   Psychiatric/Behavioral: Negative for behavioral problems and dysphoric mood. The patient is nervous/anxious.     History Past Medical History:  Diagnosis Date  . Anxiety   . Back pain   . Constipation   . Depression   . Diabetes mellitus without complication ([place])   . Gallbladder problem   . Heart murmur   . Hyperlipidemia   . Hypertension   . Prediabetes   . Sleep apnea    On CPAP  . Spider bite   . Swelling   . Thyroid disease    hypothyroidism    She has a past surgical history that includes Cholecystectomy; Dilation and curettage of uterus; Tonsillectomy; fibroids scrapping; and Esophagogastroduodenoscopy (egd) with propofol (N/A, 09/05/2017).   Her family history includes Arthritis in an other family member; Breast cancer in her maternal aunt and paternal grandmother; Cancer in her maternal aunt and maternal aunt; Depression in her mother and another family member; Diabetes in her maternal aunt and mother; Hyperlipidemia in an other family member; Lung cancer in her maternal aunt; Thyroid disease in her mother.She reports that she quit smoking about 29 years ago. Her smoking use included cigarettes. She has a 1.50 pack-year smoking history. She has never used smokeless tobacco. She reports current alcohol use. She reports that she does not use drugs.  Current Outpatient Medications on File Prior to Visit  Medication Sig Dispense Refill  . atorvastatin (LIPITOR) 40 MG tablet TAKE 1 TABLET BY MOUTH  DAILY 90 tablet 0  . Calcium Carbonate-Vitamin D (CALCIUM 500 + D PO) Take 1 tablet by mouth  2 (two) times daily.    . fenofibrate 160 MG tablet TAKE 1 TABLET BY MOUTH  DAILY 90 tablet 3  . levothyroxine (SYNTHROID) 112 MCG tablet TAKE 1 TABLET BY MOUTH  DAILY BEFORE BREAKFAST 90 tablet 3  . lisinopril (ZESTRIL) 5 MG tablet TAKE 1 TABLET BY MOUTH  DAILY 90 tablet 3  . metFORMIN (GLUCOPHAGE) 500 MG tablet TAKE 1 TABLET BY MOUTH  DAILY WITH BREAKFAST 90 tablet 3  . Multiple Vitamin  (MULTIVITAMIN) tablet Take 1 tablet by mouth daily.    [name] venlafaxine XR (EFFEXOR-XR) 150 MG 24 hr capsule TAKE 1 CAPSULE BY MOUTH  DAILY 90 capsule 3  . Vitamin D, Ergocalciferol, (DRISDOL) 1.25 MG (50000 UNIT) CAPS capsule TAKE 1 CAPSULE BY MOUTH  EVERY 7 DAYS 13 capsule 3   No current facility-administered medications on file prior to visit.     Objective:  Objective  Physical Exam Vitals and nursing note reviewed.  Constitutional:      General: She is not in acute distress.    Appearance: Normal appearance. She is well-developed. She is not ill-appearing.  HENT:     Head: Normocephalic and atraumatic.     Right Ear: External ear normal.     Left Ear: External ear normal.     Nose: Nose normal.  Eyes:     General:        Right eye: No discharge.        Left eye: No discharge.     Extraocular Movements: Extraocular movements intact.     Pupils: Pupils are equal, round, and reactive to light.  Cardiovascular:     Rate and Rhythm: Normal rate and regular rhythm.     Pulses: Normal pulses.     Heart sounds: Normal heart sounds. No murmur heard. No friction rub. No gallop.   Pulmonary:     Effort: Pulmonary effort is normal. No respiratory distress.     Breath sounds: Normal breath sounds. No stridor. No wheezing, rhonchi or rales.  Chest:     Chest wall: No tenderness.  Abdominal:     General: Bowel sounds are normal. There is no distension.     Palpations: Abdomen is soft. There is no mass.     Tenderness: There is no abdominal tenderness. There is no guarding or rebound.     Hernia: No hernia is present.  Musculoskeletal:        General: Normal range of motion.     Cervical back: Normal range of motion and neck supple.     Right lower leg: No edema.     Left lower leg: No edema.  Skin:    General: Skin is warm and dry.  Neurological:     Mental Status: She is alert and oriented to person, place, and time.  Psychiatric:        Behavior: Behavior normal.         Thought Content: Thought content normal.    Diabetic Foot Exam - Simple   Simple Foot Form Diabetic Foot exam was performed with the following findings: Yes 07/11/2020  9:10 AM  Visual Inspection No deformities, no ulcerations, no other skin breakdown bilaterally: Yes Sensation Testing Intact to touch and monofilament testing bilaterally: Yes Pulse Check Posterior Tibialis and Dorsalis pulse intact bilaterally: Yes Comments     BP 100/70 (BP Location: Right Arm, Patient Position: Sitting, Cuff Size: Large)   Pulse 86   Temp 98.7 F (37.1 C) (Oral)   Resp 20  Ht 5\' 5"  (1.651 m)   Wt 262 lb 12.8 oz (119.2 kg)   SpO2 97%   BMI 43.73 kg/m  Wt Readings from Last 3 Encounters:  07/11/20 262 lb 12.8 oz (119.2 kg)  07/28/19 261 lb 9.6 oz (118.7 kg)  05/14/19 256 lb 9.6 oz (116.4 kg)     Lab Results  Component Value Date   WBC 6.2 01/01/2018   HGB 12.5 01/01/2018   HCT 38.0 01/01/2018   PLT 305 01/01/2018   GLUCOSE 152 (H) 05/14/2019   CHOL 130 05/14/2019   TRIG 141.0 05/14/2019   HDL 36.70 (L) 05/14/2019   LDLDIRECT 193.3 11/06/2012   LDLCALC 65 05/14/2019   ALT 17 05/14/2019   AST 20 05/14/2019   NA 141 05/14/2019   K 4.4 05/14/2019   CL 105 05/14/2019   CREATININE 0.90 05/14/2019   BUN 21 05/14/2019   CO2 30 05/14/2019   TSH 0.46 05/14/2019   HGBA1C 6.8 (H) 05/14/2019   MICROALBUR 1.4 09/12/2015    MM 3D SCREEN BREAST BILATERAL  Result Date: 01/06/2020 CLINICAL DATA:  Screening. EXAM: DIGITAL SCREENING BILATERAL MAMMOGRAM WITH TOMO AND CAD COMPARISON:  Previous exam(s). ACR Breast Density Category b: There are scattered areas of fibroglandular density. FINDINGS: There are no findings suspicious for malignancy. Images were processed with CAD. IMPRESSION: No mammographic evidence of malignancy. A result letter of this screening mammogram will be mailed directly to the patient. RECOMMENDATION: Screening mammogram in one year. (Code:SM-B-01Y) BI-RADS CATEGORY  1:  Negative. Electronically Signed   By: Abelardo Diesel M.D.   On: 01/06/2020 13:08     Assessment & Plan:  Plan    Meds ordered this encounter  Medications  . ALPRAZolam (XANAX) 0.25 MG tablet    Sig: Take 1 tablet (0.25 mg total) by mouth 2 (two) times daily as needed for anxiety.    Dispense:  20 tablet    Refill:  0    Problem List Items Addressed This Visit      Unprioritized   Chest heaviness - Primary    cxr  ekg---  No change from previous 12/2017 Check echo May be anxiety== rto if occurs again or go to ER      Relevant Orders   EKG 12-Lead (Completed)   ECHOCARDIOGRAM COMPLETE   Lipid panel   Hemoglobin A1c   CBC with Differential/Platelet   TSH   Comprehensive metabolic panel   Microalbumin / creatinine urine ratio   DG Chest 2 View   Depression with anxiety    Pt is on effexor Chest pressure may be anxiety rx xanax ---  For prn use       Relevant Medications   ALPRAZolam (XANAX) 0.25 MG tablet   Hyperlipidemia associated with type 2 diabetes mellitus ([place])    Tolerating statin, encouraged heart healthy diet, avoid trans fats, minimize simple carbs and saturated fats. Increase exercise as tolerated      Relevant Orders   Lipid panel   Hemoglobin A1c   CBC with Differential/Platelet   TSH   Comprehensive metabolic panel   Microalbumin / creatinine urine ratio   AMB Referral to [facility]   Hypothyroidism    Check tsh      MORBID OBESITY   Primary hypertension    Well controlled, no changes to meds. Encouraged heart healthy diet such as the DASH diet and exercise as tolerated.       Relevant Orders   Lipid panel   Hemoglobin A1c  CBC with Differential/Platelet   TSH   Comprehensive metabolic panel   Microalbumin / creatinine urine ratio   AMB Referral to [facility]ordin[facility]   Type 2 diabetes mellitus with hyperglycemia, without long-term current use of insulin ([facility])    hgba1c to be checked, minimize simple carbs.  Increase exercise as tolerated. Continue current meds       Relevant Orders   Lipid panel   Hemoglobin A1c   CBC with Differential/Platelet   TSH   Comprehensive metabolic panel   Microalbumin / creatinine urine ratio   AMB Referral to [facility]    Other Visit Diagnoses    Anxiety       Relevant Medications   ALPRAZolam (XANAX) 0.25 MG tablet      Follow-up: Return in about 6 months (around 01/11/2021), or if symptoms worsen or fail to improve, for hypertension, hyperlipidemia, diabetes II, fasting, annual exam.  I,Gordon Zheng,acting as a scribe for [employer], DO.,have documented all relevant documentation on the behalf of [name], DO,as directed by  [name], DO while in the presence of [name], DO.  I, [name], DO, have reviewed all documentation for this visit. The documentation on 07/11/20 for the exam, diagnosis, procedures, and orders are all accurate and complete.

## 2020-07-11 NOTE — Telephone Encounter (Signed)
Glucose -- very elevated Pt needs to start tresiba 10 u sq daily  Will need glucometer and supplies sent in--- she will need nurse visit for dm ed and needs to start tresiba today

## 2020-07-11 NOTE — Patient Instructions (Signed)

## 2020-07-11 NOTE — Assessment & Plan Note (Signed)
Check tsh 

## 2020-07-11 NOTE — Addendum Note (Signed)
Addended by: Sanda Linger on: 07/11/2020 03:58 PM   Modules accepted: Orders

## 2020-07-11 NOTE — Telephone Encounter (Signed)
Pt called. Pt verbalized understanding. Rx and meter sent to pharmacy. Pt will call back to schedule nurse visit once she gets the meter.

## 2020-07-11 NOTE — Assessment & Plan Note (Signed)
hgba1c to be checked, minimize simple carbs. Increase exercise as tolerated. Continue current meds  

## 2020-07-12 ENCOUNTER — Other Ambulatory Visit: Payer: Self-pay

## 2020-07-12 ENCOUNTER — Telehealth: Payer: Self-pay

## 2020-07-12 DIAGNOSIS — E1165 Type 2 diabetes mellitus with hyperglycemia: Secondary | ICD-10-CM

## 2020-07-12 MED ORDER — "INSULIN SYRINGE-NEEDLE U-100 31G X 15/64"" 1 ML MISC": 1.0000 | Freq: Every day | 3 refills | Status: DC

## 2020-07-12 NOTE — Telephone Encounter (Signed)
Done this morning 

## 2020-07-12 NOTE — Telephone Encounter (Signed)
Who Is Calling Patient / Member / Family / Caregiver Caller Name Mekaela Azizi Caller Phone Number [phone] Patient Name Amy Mahoney Patient DOB 10/20/1949 Call Type Message Only Information Provided  Initial Comment Caller: states his sister was in the office today for a heart checkup and anxiety issues she's been having. Caller states her stress level is currently through the roof due to her being told her sugar was high. The office called in insulin, needles, machines, etc. She was told to take it right away, but when they called in the prescription they didn't send over the needles, [facility] sent a request but caller is requesting needles to be sent ASAP. Caller is upset because of how it was handled for her because she felt she was left in the dark. Additional Comment Caller is declining triage, he wants to send this as a message only for the doctor to call him directly because he is very upset. Office hours provided

## 2020-07-12 NOTE — Telephone Encounter (Signed)
Needles sent to pharmacy per pt request.

## 2020-07-25 ENCOUNTER — Other Ambulatory Visit: Payer: Self-pay

## 2020-07-25 DIAGNOSIS — E1165 Type 2 diabetes mellitus with hyperglycemia: Secondary | ICD-10-CM

## 2020-07-25 MED ORDER — INSULIN PEN NEEDLE 31G X 5 MM MISC: 1.0000 | Freq: Every day | 0 refills | Status: DC

## 2020-08-07 ENCOUNTER — Other Ambulatory Visit: Payer: Self-pay | Admitting: Family Medicine

## 2020-08-08 NOTE — Progress Notes (Signed)
HPI female former smoker followed for OSA, Cough, complicated by obesity, hypothyroid, GERD NPSG 08/03/94 RDI/AHI 49/hr, weight was 285 pounds  ----------------------------------------------------------------------------   07/28/19- 70 year old female former smoker followed for OSA, Cough, complicated by Obesity, hypothyroid, GERD, DM2, Hyperlipidemia,  CPAP auto 10-20/Adapt Download compliance 100%, AHI 5.2/ hr Body weight today 261 lbs -----using C-Pap nightly  For the past year has been waking with dry mouth. Nasal mask. Discussed chin strap, Biotene, adjust humidifier.    08/09/20- 70 year old female former smoker followed for OSA, Cough, complicated by Obesity, hypothyroid, GERD, DM2, Hyperlipidemia,  CPAP auto 10-20/Adapt Download- compliance 100%, AHI 4.5/ hr Body weight today-265 lbs Covid vax-none -----No problems.  Having echo on Thursday. Sleeps very well with CPAP. Download reviewed with her. Pressures good Pending echo- has chronic murmur. Now insulin dependent. CXR 07/11/20 IMPRESSION: Chest negative for acute cardiopulmonary disease  ROS-see HPI   + = positive Constitutional:   No-   weight loss, night sweats, fevers, chills, fatigue, lassitude. HEENT:   No-  headaches, difficulty swallowing, tooth/dental problems, sore throat,       No-  sneezing, itching, ear ache, +nasal congestion, post nasal drip,  CV:  No-   chest pain, orthopnea, PND, swelling in lower extremities, anasarca,  dizziness, palpitations Resp: No-   shortness of breath with exertion or at rest.               productive cough,   non-productive cough,  No- coughing up of blood.              No-   change in color of mucus.  No- wheezing.   Skin: No-   rash or lesions. GI:  +heartburn, indigestion, abdominal pain, nausea, vomiting, GU: . MS:  No-   joint pain or swelling.   Neuro-     nothing unusual Psych:  No- change in mood or affect. No depression or anxiety.  No memory loss.  OBJ- Physical  Exam General- Alert, Oriented, Affect-appropriate, Distress- none acute, + morbidly obese Skin- rash-none, lesions- none, excoriation- none Lymphadenopathy- none Head- atraumatic            Eyes- Gross vision intact, PERRLA, conjunctivae and secretions clear            Ears- Hearing, canals-normal            Nose- Clear, no-Septal dev, mucus, polyps, erosion, perforation             Throat- Mallampati III-IV , mucosa clear , drainage- none, tonsils- atrophic Neck- flexible , trachea midline, no stridor , thyroid nl, carotid no bruit Chest - symmetrical excursion , unlabored           Heart/CV- RRR , +5/8 systolic M, no gallop  , no rub, nl s1 s2                           - JVD- none , edema- none, stasis changes- none, varices- none           Lung- clear, wheeze- none, cough- none , dullness-none, rub- none           Chest wall-  Abd-  Br/ Gen/ Rectal- Not done, not indicated Extrem- cyanosis- none, clubbing, none, atrophy- none, strength- nl Neuro- grossly intact to observation   .

## 2020-08-09 ENCOUNTER — Ambulatory Visit: Payer: Medicare Other | Admitting: Internal Medicine

## 2020-08-09 ENCOUNTER — Other Ambulatory Visit: Payer: Self-pay

## 2020-08-09 ENCOUNTER — Encounter: Payer: Self-pay | Admitting: Internal Medicine

## 2020-08-09 VITALS — BP 138/90 | HR 79 | Temp 98.5°F | Ht 65.5 in | Wt 265.2 lb

## 2020-08-09 DIAGNOSIS — G4733 Obstructive sleep apnea (adult) (pediatric): Secondary | ICD-10-CM

## 2020-08-09 NOTE — Patient Instructions (Signed)
We can continue CPAP auto 10-20  I'm glad you are doing well and I hope you have a good report on your Echocardiogram. Please let us know if we can help

## 2020-08-11 ENCOUNTER — Encounter: Payer: Self-pay | Admitting: Family Medicine

## 2020-08-11 ENCOUNTER — Other Ambulatory Visit: Payer: Self-pay

## 2020-08-11 ENCOUNTER — Ambulatory Visit (HOSPITAL_BASED_OUTPATIENT_CLINIC_OR_DEPARTMENT_OTHER)
Admission: RE | Admit: 2020-08-11 | Discharge: 2020-08-11 | Disposition: A | Discharge status: Home / Self Care | Payer: Medicare Other | Source: Ambulatory Visit | Attending: Family Medicine | Admitting: Family Medicine

## 2020-08-11 DIAGNOSIS — I5189 Other ill-defined heart diseases: Secondary | ICD-10-CM

## 2020-08-11 DIAGNOSIS — R0789 Other chest pain: Secondary | ICD-10-CM | POA: Insufficient documentation

## 2020-08-11 LAB — ECHOCARDIOGRAM COMPLETE
AR max vel: 1.49 cm2
AV Area VTI: 1.57 cm2
AV Area mean vel: 1.64 cm2
AV Mean grad: 11.5 mmHg
AV Peak grad: 24.1 mmHg
Ao pk vel: 2.46 m/s
Area-P 1/2: 2.74 cm2
S' Lateral: 1.97 cm
Single Plane A4C EF: 67.1 %

## 2020-08-11 NOTE — Progress Notes (Signed)
*  PRELIMINARY RESULTS* Echocardiogram 2D Echocardiogram has been performed.  Luisa Hart RDCS 08/11/2020, 9:00 AM

## 2020-08-12 ENCOUNTER — Encounter: Payer: Self-pay | Admitting: Internal Medicine

## 2020-08-12 ENCOUNTER — Other Ambulatory Visit: Payer: Self-pay | Admitting: Family Medicine

## 2020-08-12 DIAGNOSIS — E038 Other specified hypothyroidism: Secondary | ICD-10-CM

## 2020-08-12 NOTE — Assessment & Plan Note (Signed)
Her obesity is adverse to several medical problems. Considered especially in context of OSA at this visit.

## 2020-08-12 NOTE — Assessment & Plan Note (Signed)
Benefits from CPAP with good compliance and control Plan- continue  Auto 10-20

## 2020-08-25 ENCOUNTER — Telehealth: Payer: Self-pay | Admitting: *Deleted

## 2020-08-25 NOTE — Chronic Care Management (AMB) (Signed)
  Chronic Care Management   Note  08/25/2020 Name: Amy Mahoney MRN: [id number] DOB: April 30, 1950  Amy Mahoney is a 70 y.o. year old female who is a primary care patient of [name], DO. I reached out to [name] by phone today in response to a referral sent by [name] PCP, [name], DO.      Amy Mahoney was given information about Chronic Care Management services today including:  CCM service includes personalized support from designated clinical staff supervised by her physician, including individualized plan of care and coordination with other care providers 24/7 contact phone numbers for assistance for urgent and routine care needs. Service will only be billed when office clinical staff spend 20 minutes or more in a month to coordinate care. Only one practitioner may furnish and bill the service in a calendar month. The patient may stop CCM services at any time (effective at the end of the month) by phone call to the office staff. The patient will be responsible for cost sharing (co-pay) of up to 20% of the service fee (after annual deductible is met).  Patient agreed to services and verbal consent obtained.   Follow up plan: Telephone appointment with care management team member scheduled for:09/07/20  [place] [facility]

## 2020-09-02 DIAGNOSIS — K5900 Constipation, unspecified: Secondary | ICD-10-CM | POA: Insufficient documentation

## 2020-09-02 DIAGNOSIS — R7303 Prediabetes: Secondary | ICD-10-CM | POA: Insufficient documentation

## 2020-09-02 DIAGNOSIS — E119 Type 2 diabetes mellitus without complications: Secondary | ICD-10-CM

## 2020-09-02 DIAGNOSIS — R609 Edema, unspecified: Secondary | ICD-10-CM | POA: Insufficient documentation

## 2020-09-02 DIAGNOSIS — R011 Cardiac murmur, unspecified: Secondary | ICD-10-CM | POA: Insufficient documentation

## 2020-09-02 DIAGNOSIS — T63301A Toxic effect of unspecified spider venom, accidental (unintentional), initial encounter: Secondary | ICD-10-CM | POA: Insufficient documentation

## 2020-09-02 DIAGNOSIS — F419 Anxiety disorder, unspecified: Secondary | ICD-10-CM | POA: Insufficient documentation

## 2020-09-02 DIAGNOSIS — M549 Dorsalgia, unspecified: Secondary | ICD-10-CM | POA: Insufficient documentation

## 2020-09-02 DIAGNOSIS — K829 Disease of gallbladder, unspecified: Secondary | ICD-10-CM | POA: Insufficient documentation

## 2020-09-02 HISTORY — DX: Type 2 diabetes mellitus without complications: E11.9

## 2020-09-04 ENCOUNTER — Other Ambulatory Visit: Payer: Self-pay | Admitting: Family Medicine

## 2020-09-04 DIAGNOSIS — F321 Major depressive disorder, single episode, moderate: Secondary | ICD-10-CM

## 2020-09-07 ENCOUNTER — Ambulatory Visit (INDEPENDENT_AMBULATORY_CARE_PROVIDER_SITE_OTHER): Payer: Medicare Other | Admitting: Pharmacist

## 2020-09-07 ENCOUNTER — Other Ambulatory Visit: Payer: Self-pay | Admitting: Family Medicine

## 2020-09-07 DIAGNOSIS — E782 Mixed hyperlipidemia: Secondary | ICD-10-CM

## 2020-09-07 DIAGNOSIS — E1165 Type 2 diabetes mellitus with hyperglycemia: Secondary | ICD-10-CM

## 2020-09-07 DIAGNOSIS — E559 Vitamin D deficiency, unspecified: Secondary | ICD-10-CM

## 2020-09-07 DIAGNOSIS — E785 Hyperlipidemia, unspecified: Secondary | ICD-10-CM

## 2020-09-07 DIAGNOSIS — F418 Other specified anxiety disorders: Secondary | ICD-10-CM

## 2020-09-07 NOTE — Patient Instructions (Signed)
Visit Information  PATIENT GOALS:  Goals Addressed             This Visit's Progress    Chronic Care Management Pharmacy Care Plan       Current Barriers:  Unable to maintain control of type 2 diabetes Education needed regarding diabetes goals and medication therapy  Pharmacist Clinical Goal(s):  Over the next 90 days, patient will achieve control of type 2 diabetes as evidenced by A1c < 7.0% maintain control of blood pressure and choelsteorl  as evidenced by BP < 140/90 and LDL <100  adhere to prescribed medication regimen as evidenced by refill history through collaboration with PharmD and provider.   Interventions: 1:1 collaboration with Carollee Herter, Alferd Apa, DO regarding development and update of comprehensive plan of care as evidenced by provider attestation and co-signature Inter-disciplinary care team collaboration (see longitudinal plan of care) Comprehensive medication review performed; medication list updated in electronic medical record  Diabetes: Uncontrolled;  A1c goal <7.0% Lab Results  Component Value Date   HGBA1C 14.3 (H) 07/11/2020  Current treatment: Tresiba 10 units daily (started 06/2020) Metformin 554mt daily  Interventions:  Reviewed home blood glucose readings and reviewed goals  Fasting blood glucose goal (before meals) = 80 to 130 Blood glucose goal after a meal = less than 180  Counseled on low carbohydrate foods / diet; patient has already mad great changes.  Recommended increase exercise - chair yoga or on-line silver sneakers - work up to goal of at least 150 minutes per week Collaborated with PCP to consider increasing metformin to 500mg  ER - take 2 tablets = 1000mg  daily; Eventually will try to taper Tresiba dose and stop if able.    Hypertension: Controlled; goal BP <140/90 BP Readings from Last 3 Encounters:  08/09/20 138/90  07/11/20 100/70  07/28/19 134/70  Current treatment: Lisinopril 5mg  daily  Interventions:  Discussed blood  pressure goals Discussed benefits of lisinopril for heart and kidneys.  Hyperlipidemia, mixed: LDL at goal of <100; Triglycerides slightly elevated above goal of <150 and HDL below goal of >45 Lipid Panel     Component Value Date/Time   CHOL 148 07/11/2020 0844   CHOL 115 04/14/2018 1019   TRIG 158.0 (H) 07/11/2020 0844   HDL 34.50 (L) 07/11/2020 0844   HDL 40 04/14/2018 1019   CHOLHDL 4 07/11/2020 0844   VLDL 31.6 07/11/2020 0844   LDLCALC 82 07/11/2020 0844   LDLCALC 60 04/14/2018 1019   LDLDIRECT 193.3 11/06/2012 0926   LABVLDL 15 04/14/2018 1019   Current treatment: Atorvastatin 40mg  daily  Fenofibrate 160mg  daily Interventions:  Discussed lipid goals Encouraged to start exercise as planned and continue with current diet changes - I think both of these will help with Tg and HDL.   Depression/Anxiety Controlled Current treatment: Venlafaxine ER 150mg  daily  Alprazolam 0.25mg  as needed for anxiety up to twice a day (patient states she never started this)  Interventions:  Continue current therapy If she continues to not need alprazolam at next check in, will remove from med list.   Patient Goals/Self-Care Activities Over the next 90 days, patient will:  Take medications as prescribed check glucose 2 to 3 times a day, document, and provide at future appointments target a minimum of 150 minutes of moderate intensity exercise weekly, and  continue dietary modifications by limiting sweets and carbohydrate intake  Follow Up Plan: Telephone follow up appointment with care management team member scheduled for:  1 month  Patient verbalizes understanding of instructions provided today and agrees to view in [place].   Telephone follow up appointment with care management team member scheduled for: 1 month  Cherre Robins, PharmD Clinical Pharmacist [facility]

## 2020-09-07 NOTE — Chronic Care Management (AMB) (Signed)
Chronic Care Management Pharmacy Note  09/07/2020 Name:  Amy Mahoney MRN:  [id number] DOB:  May 22, 1950  Subjective: Amy Mahoney is an 70 y.o. year old female who is a primary patient of [name], DO.  The CCM team was consulted for assistance with disease management and care coordination needs.    Engaged with patient by telephone for initial visit in response to provider referral for pharmacy case management and/or care coordination services.   Consent to Services:  The patient was given the following information about Chronic Care Management services today, agreed to services, and gave verbal consent: 1. CCM service includes personalized support from designated clinical staff supervised by the primary care provider, including individualized plan of care and coordination with other care providers 2. 24/7 contact phone numbers for assistance for urgent and routine care needs. 3. Service will only be billed when office clinical staff spend 20 minutes or more in a month to coordinate care. 4. Only one practitioner may furnish and bill the service in a calendar month. 5.The patient may stop CCM services at any time (effective at the end of the month) by phone call to the office staff. 6. The patient will be responsible for cost sharing (co-pay) of up to 20% of the service fee (after annual deductible is met). Patient agreed to services and consent obtained.  Patient Care Team: Carollee Herter, Alferd Apa, DO as PCP - General Deneise Lever, MD as Consulting Physician (Pulmonary Disease) Dermatology, [facility], Lajuan Lines, MD as Consulting Physician (Gastroenterology) Cherre Robins, PharmD (Pharmacist)  Recent office visits: 07/11/2020 - PCP (Dr Etter Sjogren) Seen for chest heaviness; found not have very elevated A1c; Tresiba 10 units started. Also started alprazolam 0.61m up to bid prn  Recent consult visits: 08/09/2020 - Pulm - Dr YAnnamaria Bootsf/u OSA - cont to use CPAP  Hospital  visits: None in previous 6 months  Objective:  Lab Results  Component Value Date   CREATININE 0.83 07/11/2020   CREATININE 0.90 05/14/2019   CREATININE 0.88 10/06/2018    Lab Results  Component Value Date   HGBA1C 14.3 (H) 07/11/2020   Last diabetic Eye exam:  Lab Results  Component Value Date/Time   HMDIABEYEEXA No Retinopathy 10/16/2019 12:00 AM    Last diabetic Foot exam: No results found for: HMDIABFOOTEX      Component Value Date/Time   CHOL 148 07/11/2020 0844   CHOL 115 04/14/2018 1019   TRIG 158.0 (H) 07/11/2020 0844   HDL 34.50 (L) 07/11/2020 0844   HDL 40 04/14/2018 1019   CHOLHDL 4 07/11/2020 0844   VLDL 31.6 07/11/2020 0844   LDLCALC 82 07/11/2020 0844   LDLCALC 60 04/14/2018 1019   LDLDIRECT 193.3 11/06/2012 0926    Hepatic Function Latest Ref Rng & Units 07/11/2020 05/14/2019 10/06/2018  Total Protein 6.0 - 8.3 g/dL 6.7 6.3 6.7  Albumin 3.5 - 5.2 g/dL 4.1 4.2 4.3  AST 0 - 37 U/L 22 20 28   ALT 0 - 35 U/L 24 17 19   Alk Phosphatase 39 - 117 U/L 64 43 44  Total Bilirubin 0.2 - 1.2 mg/dL 0.4 0.3 0.3  Bilirubin, Direct 0.0 - 0.3 mg/dL - - -    Lab Results  Component Value Date/Time   TSH 2.29 07/11/2020 08:44 AM   TSH 0.46 05/14/2019 04:10 PM   FREET4 1.09 01/01/2018 11:36 AM   FREET4 1.08 01/07/2014 08:54 AM    CBC Latest Ref Rng & Units 07/11/2020 01/01/2018  10/17/2017  WBC 4.0 - 10.5 K/uL 5.8 6.2 6.1  Hemoglobin 12.0 - 15.0 g/dL 13.0 12.5 12.7  Hematocrit 36.0 - 46.0 % 39.1 38.0 38.0  Platelets 150.0 - 400.0 K/uL 294.0 305 281.0    Lab Results  Component Value Date/Time   VD25OH 43.0 10/06/2018 10:39 AM   VD25OH 54.6 04/14/2018 10:19 AM    Clinical ASCVD: No  The 10-year ASCVD risk score Mikey Bussing DC Jr., et al., 2013) is: 26.2%   Values used to calculate the score:     Age: 46 years     Sex: Female     Is Non-Hispanic African American: No     Diabetic: Yes     Tobacco smoker: No     Systolic Blood Pressure: 637 mmHg     Is BP treated:  Yes     HDL Cholesterol: 34.5 mg/dL     Total Cholesterol: 148 mg/dL    Other:  DEXA 12/20/2014 AP Spine L3-L4 - Normal T-Score = 2.5  DualFemur Neck Left - Normal  T-Score = -0.3     Social History   Tobacco Use  Smoking Status Former   Packs/day: 0.50   Years: 3.00   Pack years: 1.50   Types: Cigarettes   Quit date: 08/10/1990   Years since quitting: 30.0  Smokeless Tobacco Never   BP Readings from Last 3 Encounters:  08/09/20 138/90  07/11/20 100/70  07/28/19 134/70   Pulse Readings from Last 3 Encounters:  08/09/20 79  07/11/20 86  07/28/19 80   Wt Readings from Last 3 Encounters:  08/09/20 265 lb 3.2 oz (120.3 kg)  07/11/20 262 lb 12.8 oz (119.2 kg)  07/28/19 261 lb 9.6 oz (118.7 kg)    Assessment: Review of patient past medical history, allergies, medications, health status, including review of consultants reports, laboratory and other test data, was performed as part of comprehensive evaluation and provision of chronic care management services.   SDOH:  (Social Determinants of Health) assessments and interventions performed:  SDOH Interventions    Flowsheet Row Most Recent Value  SDOH Interventions   Financial Strain Interventions Intervention Not Indicated  Physical Activity Interventions Other (Comments)  [patient has ordered chair yoga video,  also discussed Silver Sneaker on line classes]       CCM Care Plan  No Known Allergies  Medications Reviewed Today     Reviewed by Cherre Robins, PharmD (Pharmacist) on 09/07/20 at 1516  Med List Status: <None>   Medication Order Taking? Sig Documenting Provider Last Dose Status Informant  ALPRAZolam (XANAX) 0.25 MG tablet [id number] No Take 1 tablet (0.25 mg total) by mouth 2 (two) times daily as needed for anxiety.  Patient not taking: Reported on 09/07/2020   [name], DO Not Taking Active   atorvastatin (LIPITOR) 40 MG tablet [id number] Yes TAKE 1 TABLET BY MOUTH  DAILY [name],  DO Taking Active   blood glucose meter kit and supplies [id number] Yes Dispense based on patient and insurance preference. Use up to four times daily as directed. (FOR ICD-10 E10.9, E11.9). Roma Schanz R, DO Taking Active   Calcium Carbonate-Vitamin D (CALCIUM 500 + D PO) [id number] Yes Take 1 tablet by mouth 2 (two) times daily. [provider] Taking Active Self  fenofibrate 160 MG tablet [id number] Yes TAKE 1 TABLET BY MOUTH  DAILY Carollee Herter, Alferd Apa, DO Taking Active   insulin degludec (TRESIBA) 100 UNIT/ML FlexTouch Pen [id number] Yes Inject 10 Units into the  skin daily. Roma Schanz R, DO Taking Active   Insulin Pen Needle 31G X 5 MM MISC [id number] Yes 1 each by Does not apply route daily. Carollee Herter, Kendrick Fries R, DO Taking Active   levothyroxine (SYNTHROID) 112 MCG tablet [id number] Yes TAKE 1 TABLET BY MOUTH  DAILY BEFORE BREAKFAST Roma Schanz R, DO Taking Active   lisinopril (ZESTRIL) 5 MG tablet [id number] Yes TAKE 1 TABLET BY MOUTH  DAILY Carollee Herter, Alferd Apa, DO Taking Active   metFORMIN (GLUCOPHAGE) 500 MG tablet [id number] Yes TAKE 1 TABLET BY MOUTH  DAILY WITH BREAKFAST [name], DO Taking Active   Multiple Vitamin (MULTIVITAMIN) tablet [id number] Yes Take 1 tablet by mouth daily. [provider] Taking Active Self  Donald Siva test strip [id number] Yes USE AS DIRECTED UP TO 4 TIMES DAILY [name], DO Taking Active   venlafaxine XR (EFFEXOR-XR) 150 MG 24 hr capsule [id number] Yes TAKE 1 CAPSULE BY MOUTH  DAILY Carollee Herter, Alferd Apa, DO Taking Active   Vitamin D, Ergocalciferol, (DRISDOL) 1.25 MG (50000 UNIT) CAPS capsule [id number] Yes TAKE 1 CAPSULE BY MOUTH  EVERY 7 DAYS [name], DO Taking Active   Med List Note Baird Lyons D, MD 07/12/15 1012): CPAP 9 Advanced            Patient Active Problem List   Diagnosis Date Noted   Anxiety 09/02/2020   Back pain 09/02/2020   Constipation 09/02/2020    Diabetes mellitus without complication ([place]) 32/35/5732   Gallbladder problem 09/02/2020   Heart murmur 09/02/2020   Prediabetes 09/02/2020   Spider bite 09/02/2020   Swelling 09/02/2020   Depression with anxiety 07/11/2020   Chest heaviness 07/11/2020   Hyperlipidemia associated with type 2 diabetes mellitus ([place]) 07/11/2020   Primary hypertension 07/11/2020   Class 3 severe obesity with serious comorbidity and body mass index (BMI) of 40.0 to 44.9 in adult ([place]) 06/10/2018   Type 2 diabetes mellitus with hyperglycemia, without long-term current use of insulin ([place]) 06/10/2018   Subungual hematoma of fifth toe of right foot 10/29/2017   Dysphagia    Abnormal esophagram    Gastroesophageal reflux disease 05/24/2017   Pharyngitis 03/03/2017   Abscess of skin 04/15/2015   Bacterial vaginal infection 12/17/2014   Muscle spasm of back 11/19/2014   Cough 09/17/2013   Bronchitis with bronchospasm 04/14/2013   Hyperlipidemia LDL goal <100 11/06/2012   FATIGUE 06/01/2009   MORBID OBESITY 03/30/2008   DEPRESSION 09/05/2006   Hypothyroidism 05/23/2006   DIZZINESS, CHRONIC 05/23/2006   Obstructive sleep apnea 05/23/2006    Immunization History  Administered Date(s) Administered   Fluad Quad(high Dose 65+) 11/14/2018, 12/21/2019   Influenza Split 11/19/2012   Influenza, High Dose Seasonal PF 12/14/2016, 10/29/2017   Influenza,inj,Quad PF,6+ Mos 11/20/2015   Influenza-Unspecified 12/16/2013   Pneumococcal Conjugate-13 11/19/2014   Pneumococcal Polysaccharide-23 10/30/2013   Tdap 08/01/2010   Zoster Recombinat (Shingrix) 12/23/2017, 04/21/2018   Zoster, Live 08/01/2010    Conditions to be addressed/monitored: HTN, HLD, Hypertriglyceridemia, DMII, Anxiety, Depression, and hypothyroidism  Care Plan : General Pharmacy (Adult)  Updates made by Cherre Robins, PHARMD since 09/07/2020 12:00 AM     Problem: type 2 DM; HDL; HTN;hypothyroidism; depression;   Priority: High  Onset Date:  09/07/2020     Long-Range Goal: Pharmacy goals for chronic care and medication management   Start Date: 09/07/2020  This Visit's Progress: On track  Priority: High  Note:   Current Barriers:  Unable to maintain control of type 2 diabetes Education needed regarding diabetes goals and medication therapy  Pharmacist Clinical Goal(s):  Over the next 90 days, patient will achieve control of type 2 diabetes as evidenced by A1c < 7.0% maintain control of blood pressure and choelsteorl  as evidenced by BP < 140/90 and LDL <100  adhere to prescribed medication regimen as evidenced by refill history  through collaboration with PharmD and provider.   Interventions: 1:1 collaboration with Carollee Herter, Alferd Apa, DO regarding development and update of comprehensive plan of care as evidenced by provider attestation and co-signature Inter-disciplinary care team collaboration (see longitudinal plan of care) Comprehensive medication review performed; medication list updated in electronic medical record  Diabetes: Uncontrolled A1c goal <7.0% Current treatment: Tresiba 10 units daily (started 06/2020) Metformin 552m daily  Last A1c was 14.3% - very significant increase compared to previous A1c from 05/14/2019 of 6.8% Patient was having chest pain, polyuria and polydipsia when she presented to last OV with PCP and A1c was found to be 14.3% She is seeing cardio 09/15/20 regarding CP / ECHO finding of mild diastolic dysfunction with SOB Current glucose readings: fasting glucose: 170-185, post prandial glucose: 180-200 BG has improved greatly - was 400's when [place] and [place] Current meal patterns:  breakfast: eggs with cheese a nd 1 slice of whole wheat toast lunch: 1/2 sandwhich with 1 cup of fruit and non starchy vegetable dinner: protein / meat and non starchy vegetable snacks: none - has cut out icecream, cakes and pies drinks: water Current exercise: none Interventions:  Reviewed home blood glucose  readings and reviewed goals  Fasting blood glucose goal (before meals) = 80 to 130 Blood glucose goal after a meal = less than 180  Counseled on low carbohydrate foods / diet; patient has already mad great changes.  Recommended increase exercise - chair yoga or on line silver sneakers - work up to goal of at least 150 minutes per week Collaborated with PCP to consider increasing metformin to 5021mER - take 2 tablets = 10004maily; Eventually will try to taper Tresiba dose and stop if able.    Hypertension: Controlled goal BP <140/90 current treatment: Lisinopril 5mg22mily   BP usually well controlled; likely on lisinopril for renal protection She is seeing cardio 09/15/20 regarding CP / ECHO finding of mild diastolic dysfunction with SOB Interventions:  Discussed blood pressure goals Discussed benefits of lisinopril for heart and kidneys.  Hyperlipidemia, mixed: LDL at goal of <100; Triglycerides slightly elevated above goal of <150 and HDL below goal of >45  Current treatment: Atorvastatin 40mg22mly  Fenofibrate 160mg 45my Current dietary patterns: see above Interventions:  Discussed lipid goals Encouraged to start exercise as planned and continue with current diet changes - I think both of these will help with Tg and HDL.   Depression/Anxiety Controlled Current treatment: Venlafaxine ER 150mg d49m  Alprazolam 0.25mg as38mded for anxiety up to twice a day (patient states she never started this)  Patient report she thinks anxiety was related to elevated BG. She is not taking alprazolam Interventions:  Continue current therapy If she continues to not need alprazolam at next check in, will remove from med list.   Patient Goals/Self-Care Activities Over the next 90 days, patient will:  take medications as prescribed, check glucose 2 to 3 times a day, document, and provide at future appointments, target a minimum of 150 minutes of moderate intensity exercise weekly, and engage  in dietary modifications by limiting sweets  and carbohydrate intake  Follow Up Plan: Telephone follow up appointment with care management team member scheduled for:  1 month         Medication Assistance: None required.  Patient affirms current coverage meets needs.  Patient's preferred pharmacy is:  [id number] [place]Phone: [phone]: [phone] [facility]  [address] [address] [id number]: [phone]: [address] [place]Phone: [phone]: [phone]  Follow Up:  Patient agrees to Care Plan and Follow-up.  Plan: Telephone follow up appointment with care management team member scheduled for:  1 month  TCherre Robins PharmD Clinical Pharmacist [facility]

## 2020-09-08 ENCOUNTER — Other Ambulatory Visit: Payer: Self-pay | Admitting: Family Medicine

## 2020-09-08 DIAGNOSIS — E785 Hyperlipidemia, unspecified: Secondary | ICD-10-CM

## 2020-09-08 MED ORDER — METFORMIN HCL ER 500 MG PO TB24: 1000.0000 mg | ORAL_TABLET | Freq: Every day | ORAL | 1 refills | Status: DC

## 2020-09-08 NOTE — Addendum Note (Signed)
Addended byDamita Dunnings D on: 09/08/2020 04:10 PM   Modules accepted: Orders

## 2020-09-09 ENCOUNTER — Telehealth: Payer: Self-pay | Admitting: Pharmacist

## 2020-09-09 NOTE — Telephone Encounter (Signed)
Patient called to regarding new dose of metformin - she will take metformin ER '500mg'$  - 2 tablets daily with breakfast. Will follow up with patient in 2 to 3 weeks to review BG readings.

## 2020-09-15 ENCOUNTER — Other Ambulatory Visit: Payer: Self-pay

## 2020-09-15 ENCOUNTER — Ambulatory Visit: Payer: Medicare Other | Admitting: Cardiology

## 2020-09-15 ENCOUNTER — Encounter: Payer: Self-pay | Admitting: Cardiology

## 2020-09-15 VITALS — BP 146/82 | HR 73 | Ht 65.0 in | Wt 257.0 lb

## 2020-09-15 DIAGNOSIS — E785 Hyperlipidemia, unspecified: Secondary | ICD-10-CM | POA: Diagnosis not present

## 2020-09-15 DIAGNOSIS — E119 Type 2 diabetes mellitus without complications: Secondary | ICD-10-CM

## 2020-09-15 DIAGNOSIS — I10 Essential (primary) hypertension: Secondary | ICD-10-CM | POA: Diagnosis not present

## 2020-09-15 DIAGNOSIS — E1169 Type 2 diabetes mellitus with other specified complication: Secondary | ICD-10-CM | POA: Diagnosis not present

## 2020-09-15 DIAGNOSIS — R072 Precordial pain: Secondary | ICD-10-CM

## 2020-09-15 DIAGNOSIS — G4733 Obstructive sleep apnea (adult) (pediatric): Secondary | ICD-10-CM | POA: Diagnosis not present

## 2020-09-15 DIAGNOSIS — R0609 Other forms of dyspnea: Secondary | ICD-10-CM

## 2020-09-15 DIAGNOSIS — Z6841 Body Mass Index (BMI) 40.0 and over, adult: Secondary | ICD-10-CM

## 2020-09-15 DIAGNOSIS — E66813 Obesity, class 3: Secondary | ICD-10-CM

## 2020-09-15 DIAGNOSIS — R0600 Dyspnea, unspecified: Secondary | ICD-10-CM | POA: Diagnosis not present

## 2020-09-15 NOTE — Patient Instructions (Signed)
Medication Instructions:  No medication changes. *If you need a refill on your cardiac medications before your next appointment, please call your pharmacy*   Lab Work: None ordered If you have labs (blood work) drawn today and your tests are completely normal, you will receive your results only by: [place] (if you have MyChart) OR A paper copy in the mail If you have any lab test that is abnormal or we need to change your treatment, we will call you to review the results.   Testing/Procedures: Your physician has requested that you have a lexiscan myoview. For further information please visit [email or web address]. Please follow instruction sheet, as given.  The test will take approximately 3 to 4 hours to complete; you may bring reading material.  If someone comes with you to your appointment, they will need to remain in the main lobby due to limited space in the testing area. **If you are pregnant or breastfeeding, please notify the nuclear lab prior to your appointment**  How to prepare for your Myocardial Perfusion Test: Do not eat or drink 3 hours prior to your test, except you may have water. Do not consume products containing caffeine (regular or decaffeinated) 12 hours prior to your test. (ex: coffee, chocolate, sodas, tea). Do bring a list of your current medications with you.  If not listed below, you may take your medications as normal. Do wear comfortable clothes (no dresses or overalls) and walking shoes, tennis shoes preferred (No heels or open toe shoes are allowed). Do NOT wear cologne, perfume, aftershave, or lotions (deodorant is allowed). If these instructions are not followed, your test will have to be rescheduled.    Follow-Up: At [facility], you and your health needs are our priority.  As part of our continuing mission to provide you with exceptional heart care, we have created designated Provider Care Teams.  These Care Teams include your primary  Cardiologist (physician) and Advanced Practice Providers (APPs -  Physician Assistants and Nurse Practitioners) who all work together to provide you with the care you need, when you need it.  We recommend signing up for the patient portal called "MyChart".  Sign up information is provided on this After Visit Summary.  MyChart is used to connect with patients for Virtual Visits (Telemedicine).  Patients are able to view lab/test results, encounter notes, upcoming appointments, etc.  Non-urgent messages can be sent to your provider as well.   To learn more about what you can do with MyChart, go to [email or web address].    Your next appointment:   6 month(s)  The format for your next appointment:   In Person  Provider:   Jyl Heinz, MD   Other Instructions Cardiac Nuclear Scan A cardiac nuclear scan is a test that is done to check the flow of blood to your heart. It is done when you are resting and when you are exercising. The test looks for problems such as: Not enough blood reaching a portion of the heart. The heart muscle not working as it should. You may need this test if: You have heart disease. You have had lab results that are not normal. You have had heart surgery or a balloon procedure to open up blocked arteries (angioplasty). You have chest pain. You have shortness of breath. In this test, a special dye (tracer) is put into your bloodstream. The tracer will travel to your heart. A camera will then take pictures of your heart to see how the tracer moves through your heart.  This test is usually done at a hospital and takes 2-4 hours. Tell a doctor about: Any allergies you have. All medicines you are taking, including vitamins, herbs, eye drops, creams, and over-the-counter medicines. Any problems you or family members have had with anesthetic medicines. Any blood disorders you have. Any surgeries you have had. Any medical conditions you have. Whether you are pregnant or  may be pregnant. What are the risks? Generally, this is a safe test. However, problems may occur, such as: Serious chest pain and heart attack. This is only a risk if the stress portion of the test is done. Rapid heartbeat. A feeling of warmth in your chest. This feeling usually does not last long. Allergic reaction to the tracer. What happens before the test? Ask your doctor about changing or stopping your normal medicines. This is important. Follow instructions from your doctor about what you cannot eat or drink. Remove your jewelry on the day of the test. What happens during the test? An IV tube will be inserted into one of your veins. Your doctor will give you a small amount of tracer through the IV tube. You will wait for 20-40 minutes while the tracer moves through your bloodstream. Your heart will be monitored with an electrocardiogram (ECG). You will lie down on an exam table. Pictures of your heart will be taken for about 15-20 minutes. You may also have a stress test. For this test, one of these things may be done: You will be asked to exercise on a treadmill or a stationary bike. You will be given medicines that will make your heart work harder. This is done if you are unable to exercise. When blood flow to your heart has peaked, a tracer will again be given through the IV tube. After 20-40 minutes, you will get back on the exam table. More pictures will be taken of your heart. Depending on the tracer that is used, more pictures may need to be taken 3-4 hours later. Your IV tube will be removed when the test is over. The test may vary among doctors and hospitals. What happens after the test? Ask your doctor: Whether you can return to your normal schedule, including diet, activities, and medicines. Whether you should drink more fluids. This will help to remove the tracer from your body. Drink enough fluid to keep your pee (urine) pale yellow. Ask your doctor, or the department  that is doing the test: When will my results be ready? How will I get my results? Summary A cardiac nuclear scan is a test that is done to check the flow of blood to your heart. Tell your doctor whether you are pregnant or may be pregnant. Before the test, ask your doctor about changing or stopping your normal medicines. This is important. Ask your doctor whether you can return to your normal activities. You may be asked to drink more fluids. This information is not intended to replace advice given to you by your health care provider. Make sure you discuss any questions you have with your health care provider. Document Revised: 05/28/2018 Document Reviewed: 07/22/2017 Elsevier Patient Education  [place].

## 2020-09-15 NOTE — Progress Notes (Signed)
Cardiology Office Note:    Date:  09/15/2020   ID:  Amy Mahoney, DOB January 26, 1951, MRN [id number]  PCP:  Carollee Herter, Alferd Apa, DO  Cardiologist:  Jenean Lindau, MD   Referring MD: Carollee Herter, Alferd Apa, *    ASSESSMENT:    1. Primary hypertension   2. MORBID OBESITY   3. Obstructive sleep apnea   4. Diabetes mellitus without complication ([place])   5. Hyperlipidemia associated with type 2 diabetes mellitus (HCC)   6. Class 3 severe obesity due to excess calories with serious comorbidity and body mass index (BMI) of 40.0 to 44.9 in adult ([place])   7. Precordial pain   8. DOE (dyspnea on exertion)    PLAN:    In order of problems listed above:  Primary prevention stressed with the patient.  Importance of compliance with diet medication stressed and she vocalized understanding. Dyspnea on exertion and chest tightness: I discussed my findings with the patient at length.  Fortunately she is walking some on a regular basis now however in view of risk factors I will do an exercise stress Cardiolite and she is agreeable.  Echocardiogram was reviewed and discussed with her at length.  I reassured her. Hypertension: Blood pressure stable and diet was emphasized.  She has an element of whitecoat hypertension. Mixed dyslipidemia: Lipids followed by primary care doctor.  Lifestyle modification urged.  Risks of elevated sugar and uncontrolled diabetes also explained to the patient. Morbid obesity: Diet was emphasized.  Risks of obesity explained and she promises to do better.  She is already lost 10 pounds and is on a right track to take care of her health.  Further recommendations will be made based on the findings of the aforementioned test.  Patient and family member had multiple questions which were answered to their satisfaction.   Medication Adjustments/Labs and Tests Ordered: Current medicines are reviewed at length with the patient today.  Concerns regarding medicines are outlined above.   Orders Placed This Encounter  Procedures   MYOCARDIAL PERFUSION IMAGING   EKG 12-Lead   No orders of the defined types were placed in this encounter.    History of Present Illness:    Amy Mahoney is a 70 y.o. female who is being seen today for the evaluation of chest pain and chest tightness and dyspnea on exertion at the request of [name], *.  Patient is a pleasant 70 year old female.  She has past medical history of essential hypertension, dyslipidemia, diabetes mellitus and morbid obesity.  She tells me that she is working on losing weight and has lost 10 pounds in the past several weeks.  She denies any chest pain orthopnea or PND.  When her sugars were elevated she has an episode of chest tightness.  No orthopnea with this no radiation to any part of body.  She is walking some on a regular basis.  Echocardiogram does was unremarkable except diastolic dysfunction.  At the time of my evaluation, the patient is alert awake oriented and in no distress.  Her family member accompanies her for this visit.  Past Medical History:  Diagnosis Date   Anxiety    Back pain    Constipation    Depression    Diabetes mellitus without complication ([place])    Gallbladder problem    Heart murmur    Hyperlipidemia    Hypertension    Prediabetes    Sleep apnea    On CPAP   Spider  bite    Swelling    Thyroid disease    hypothyroidism    Past Surgical History:  Procedure Laterality Date   CHOLECYSTECTOMY     DILATION AND CURETTAGE OF UTERUS     ESOPHAGOGASTRODUODENOSCOPY (EGD) WITH PROPOFOL N/A 09/05/2017   Procedure: ESOPHAGOGASTRODUODENOSCOPY (EGD) WITH PROPOFOL;  Surgeon: Jerene Bears, MD;  Location: [facility];  Service: Gastroenterology;  Laterality: N/A;   fibroids scrapping     TONSILLECTOMY      Current Medications: Current Meds  Medication Sig   atorvastatin (LIPITOR) 40 MG tablet TAKE 1 TABLET BY MOUTH  DAILY   blood glucose meter kit and supplies Dispense  based on patient and insurance preference. Use up to four times daily as directed. (FOR ICD-10 E10.9, E11.9).   Calcium Carbonate-Vitamin D (CALCIUM 500 + D PO) Take 1 tablet by mouth 2 (two) times daily.   fenofibrate 160 MG tablet TAKE 1 TABLET BY MOUTH  DAILY   insulin degludec (TRESIBA) 100 UNIT/ML FlexTouch Pen Inject 10 Units into the skin daily.   Insulin Pen Needle 31G X 5 MM MISC 1 each by Does not apply route daily.   levothyroxine (SYNTHROID) 112 MCG tablet TAKE 1 TABLET BY MOUTH  DAILY BEFORE BREAKFAST   lisinopril (ZESTRIL) 5 MG tablet TAKE 1 TABLET BY MOUTH  DAILY   metFORMIN (GLUCOPHAGE XR) 500 MG 24 hr tablet Take 2 tablets (1,000 mg total) by mouth daily with breakfast.   Multiple Vitamin (MULTIVITAMIN) tablet Take 1 tablet by mouth daily.   ONETOUCH ULTRA test strip USE AS DIRECTED UP TO 4 TIMES DAILY   venlafaxine XR (EFFEXOR-XR) 150 MG 24 hr capsule TAKE 1 CAPSULE BY MOUTH  DAILY   Vitamin D, Ergocalciferol, (DRISDOL) 1.25 MG (50000 UNIT) CAPS capsule TAKE 1 CAPSULE BY MOUTH  EVERY 7 DAYS     Allergies:   Patient has no known allergies.   Social History   Socioeconomic History   Marital status: Divorced    Spouse name: Not on file   Number of children: 0   Years of education: Not on file   Highest education level: Not on file  Occupational History   Occupation: Eps Chartered loss adjuster: [employer]    Employer: [employer]   Occupation: RETIRED  Tobacco Use   Smoking status: Former    Packs/day: 0.50    Years: 3.00    Pack years: 1.50    Types: Cigarettes    Quit date: 08/10/1990    Years since quitting: 30.1   Smokeless tobacco: Never  Vaping Use   Vaping Use: Never used  Substance and Sexual Activity   Alcohol use: Yes    Comment: rarely   Drug use: No   Sexual activity: Not Currently    Partners: Male  Other Topics Concern   Not on file  Social History Narrative   Regular exercise - yes-- 10,000 steps   Caffeine use: 2 cups of  coffee daily   Social Determinants of Health   Financial Resource Strain: Low Risk    Difficulty of Paying Living Expenses: Not hard at all  Food Insecurity: Not on file  Transportation Needs: Not on file  Physical Activity: Inactive   Days of Exercise per Week: 0 days   Minutes of Exercise per Session: 0 min  Stress: Not on file  Social Connections: Not on file     Family History: The patient's family history includes Arthritis in an other family member; Breast cancer in  her maternal aunt and paternal grandmother; Cancer in her maternal aunt and maternal aunt; Depression in her mother and another family member; Diabetes in her maternal aunt and mother; Hyperlipidemia in an other family member; Lung cancer in her maternal aunt; Thyroid disease in her mother. There is no history of Colon cancer, Pancreatic cancer, Stomach cancer, Esophageal cancer, or Rectal cancer.  ROS:   Please see the history of present illness.    All other systems reviewed and are negative.  EKGs/Labs/Other Studies Reviewed:    The following studies were reviewed today: IMPRESSIONS     1. Left ventricular ejection fraction, by estimation, is 60 to 65%. The  left ventricle has normal function. The left ventricle has no regional  wall motion abnormalities. Left ventricular diastolic parameters are  consistent with Grade I diastolic  dysfunction (impaired relaxation).   2. Right ventricular systolic function is normal. The right ventricular  size is normal.   3. The mitral valve is normal in structure. No evidence of mitral valve  regurgitation. No evidence of mitral stenosis. Moderate mitral annular  calcification.   4. The aortic valve is normal in structure. There is mild calcification  of the aortic valve. There is mild thickening of the aortic valve. Aortic  valve regurgitation is not visualized. Mild aortic valve stenosis. Aortic  valve mean gradient measures 11.5   mmHg.   5. The inferior vena cava  is normal in size with greater than 50%  respiratory variability, suggesting right atrial pressure of 3 mmHg.    Recent Labs: 07/11/2020: ALT 24; BUN 17; Creatinine, Ser 0.83; Hemoglobin 13.0; Platelets 294.0; Potassium 4.8; Sodium 135; TSH 2.29  Recent Lipid Panel    Component Value Date/Time   CHOL 148 07/11/2020 0844   CHOL 115 04/14/2018 1019   TRIG 158.0 (H) 07/11/2020 0844   HDL 34.50 (L) 07/11/2020 0844   HDL 40 04/14/2018 1019   CHOLHDL 4 07/11/2020 0844   VLDL 31.6 07/11/2020 0844   LDLCALC 82 07/11/2020 0844   LDLCALC 60 04/14/2018 1019   LDLDIRECT 193.3 11/06/2012 0926    Physical Exam:    VS:  BP (!) 146/82 (BP Location: Left Arm, Patient Position: Sitting, Cuff Size: Large)   Pulse 73   Ht 5' 5"  (1.651 m)   Wt 257 lb (116.6 kg)   SpO2 96%   BMI 42.77 kg/m     Wt Readings from Last 3 Encounters:  09/15/20 257 lb (116.6 kg)  08/09/20 265 lb 3.2 oz (120.3 kg)  07/11/20 262 lb 12.8 oz (119.2 kg)     GEN: Patient is in no acute distress HEENT: Normal NECK: No JVD; No carotid bruits LYMPHATICS: No lymphadenopathy CARDIAC: S1 S2 regular, 2/6 systolic murmur at the apex. RESPIRATORY:  Clear to auscultation without rales, wheezing or rhonchi  ABDOMEN: Soft, non-tender, non-distended MUSCULOSKELETAL:  No edema; No deformity  SKIN: Warm and dry NEUROLOGIC:  Alert and oriented x 3 PSYCHIATRIC:  Normal affect    Signed, Jenean Lindau, MD  09/15/2020 10:13 AM    [place]

## 2020-09-20 ENCOUNTER — Telehealth (HOSPITAL_COMMUNITY): Payer: Self-pay | Admitting: *Deleted

## 2020-09-20 NOTE — Telephone Encounter (Signed)
Close encounter 

## 2020-09-22 ENCOUNTER — Ambulatory Visit (HOSPITAL_COMMUNITY)
Admission: RE | Admit: 2020-09-22 | Discharge: 2020-09-22 | Disposition: A | Discharge status: Home / Self Care | Payer: Medicare Other | Source: Ambulatory Visit | Attending: Cardiology | Admitting: Cardiology

## 2020-09-22 ENCOUNTER — Other Ambulatory Visit: Payer: Self-pay

## 2020-09-22 DIAGNOSIS — R072 Precordial pain: Secondary | ICD-10-CM | POA: Insufficient documentation

## 2020-09-22 DIAGNOSIS — R0609 Other forms of dyspnea: Secondary | ICD-10-CM

## 2020-09-22 DIAGNOSIS — R0600 Dyspnea, unspecified: Secondary | ICD-10-CM | POA: Diagnosis not present

## 2020-09-22 MED ORDER — TECHNETIUM TC 99M TETROFOSMIN IV KIT
28.7000 | PACK | Freq: Once | INTRAVENOUS | Status: AC | PRN
  Administered 2020-09-22: 28.7 via INTRAVENOUS
  Filled 2020-09-22: qty 29

## 2020-09-23 ENCOUNTER — Ambulatory Visit (HOSPITAL_COMMUNITY)
Admission: RE | Admit: 2020-09-23 | Discharge: 2020-09-23 | Disposition: A | Discharge status: Home / Self Care | Payer: Medicare Other | Source: Ambulatory Visit | Attending: Cardiology | Admitting: Cardiology

## 2020-09-23 LAB — MYOCARDIAL PERFUSION IMAGING
Estimated workload: 7 METS
Exercise duration (min): 7 min
Exercise duration (sec): 10 s
LV dias vol: 71 mL (ref 46–106)
LV sys vol: 22 mL
MPHR: 150 {beats}/min
Peak HR: 141 {beats}/min
Percent HR: 94 %
Rest HR: 65 {beats}/min
SDS: 2
SRS: 0
SSS: 2
TID: 0.86

## 2020-09-23 MED ORDER — TECHNETIUM TC 99M TETROFOSMIN IV KIT
32.1000 | PACK | Freq: Once | INTRAVENOUS | Status: AC | PRN
  Administered 2020-09-23: 32.1 via INTRAVENOUS

## 2020-10-04 ENCOUNTER — Other Ambulatory Visit: Payer: Self-pay | Admitting: Family Medicine

## 2020-10-06 ENCOUNTER — Ambulatory Visit (INDEPENDENT_AMBULATORY_CARE_PROVIDER_SITE_OTHER): Payer: Medicare Other | Admitting: Pharmacist

## 2020-10-06 DIAGNOSIS — E782 Mixed hyperlipidemia: Secondary | ICD-10-CM | POA: Diagnosis not present

## 2020-10-06 DIAGNOSIS — E1165 Type 2 diabetes mellitus with hyperglycemia: Secondary | ICD-10-CM | POA: Diagnosis not present

## 2020-10-06 DIAGNOSIS — I10 Essential (primary) hypertension: Secondary | ICD-10-CM

## 2020-10-06 NOTE — Chronic Care Management (AMB) (Signed)
Chronic Care Management Pharmacy Note  10/06/2020 Name:  Amy Mahoney MRN:  [id number] DOB:  [birth year]  Subjective: Amy Mahoney is an 70 y.o. year old female who is a primary patient of [name], DO.  The CCM team was consulted for assistance with disease management and care coordination needs.    Engaged with patient by telephone for follow up visit in response to provider referral for pharmacy case management and/or care coordination services.   Consent to Services:  The patient was given information about Chronic Care Management services, agreed to services, and gave verbal consent prior to initiation of services.  Please see initial visit note for detailed documentation.   Patient Care Team: Carollee Herter, Alferd Apa, DO as PCP - General Deneise Lever, MD as Consulting Physician (Pulmonary Disease) Dermatology, [facility], Lajuan Lines, MD as Consulting Physician (Gastroenterology) Cherre Robins, PharmD (Pharmacist)  Recent office visits: 07/11/2020 - PCP (Dr Etter Sjogren) Seen for chest heaviness; found not have very elevated A1c; Tresiba 10 units started. Also started alprazolam 0.61m up to bid prn  Recent consult visits: 09/22/2020 - Exercise Stress test - per Dr RGeraldo Pitter- unremarkable.  09/15/2020 - Cardio (Dr RGeraldo Pitter Seen for CP/ HTN. Ordered exercise stress Cardiolite. Emphasized diet modification, weight loss, exercise. No medication changes noted. 08/09/2020 - Pulm - Dr YAnnamaria Bootsf/u OSA - cont to use CPAP  Hospital visits: None in previous 6 months  Objective:  Lab Results  Component Value Date   CREATININE 0.83 07/11/2020   CREATININE 0.90 05/14/2019   CREATININE 0.88 10/06/2018    Lab Results  Component Value Date   HGBA1C 14.3 (H) 07/11/2020   Last diabetic Eye exam:  Lab Results  Component Value Date/Time   HMDIABEYEEXA No Retinopathy 10/16/2019 12:00 AM    Last diabetic Foot exam: No results found for: HMDIABFOOTEX      Component  Value Date/Time   CHOL 148 07/11/2020 0844   CHOL 115 04/14/2018 1019   TRIG 158.0 (H) 07/11/2020 0844   HDL 34.50 (L) 07/11/2020 0844   HDL 40 04/14/2018 1019   CHOLHDL 4 07/11/2020 0844   VLDL 31.6 07/11/2020 0844   LDLCALC 82 07/11/2020 0844   LDLCALC 60 04/14/2018 1019   LDLDIRECT 193.3 11/06/2012 0926    Hepatic Function Latest Ref Rng & Units 07/11/2020 05/14/2019 10/06/2018  Total Protein 6.0 - 8.3 g/dL 6.7 6.3 6.7  Albumin 3.5 - 5.2 g/dL 4.1 4.2 4.3  AST 0 - 37 U/L _0 ALT 0 - 35 U/L _1 Alk Phosphatase 39 - 117 U/L 64 43 44  Total Bilirubin 0.2 - 1.2 mg/dL 0.4 0.3 0.3  Bilirubin, Direct 0.0 - 0.3 mg/dL - - -    Lab Results  Component Value Date/Time   TSH 2.29 07/11/2020 08:44 AM   TSH 0.46 05/14/2019 04:10 PM   FREET4 1.09 01/01/2018 11:36 AM   FREET4 1.08 01/07/2014 08:54 AM    CBC Latest Ref Rng & Units 07/11/2020 01/01/2018 10/17/2017  WBC 4.0 - 10.5 K/uL 5.8 6.2 6.1  Hemoglobin 12.0 - 15.0 g/dL 13.0 12.5 12.7  Hematocrit 36.0 - 46.0 % 39.1 38.0 38.0  Platelets 150.0 - 400.0 K/uL 294.0 305 281.0    Lab Results  Component Value Date/Time   VD25OH 43.0 10/06/2018 10:39 AM   VD25OH 54.6 04/14/2018 10:19 AM    Clinical ASCVD: No  The 10-year ASCVD risk score (Mikey BussingDC Jr., et al., 2013) is: 28.8%  Values used to calculate the score:     Age: 41 years     Sex: Female     Is Non-Hispanic African American: No     Diabetic: Yes     Tobacco smoker: No     Systolic Blood Pressure: 373 mmHg     Is BP treated: Yes     HDL Cholesterol: 34.5 mg/dL     Total Cholesterol: 148 mg/dL    Other:  DEXA 12/20/2014 AP Spine L3-L4 - Normal T-Score = 2.5  DualFemur Neck Left - Normal  T-Score = -0.3     Social History   Tobacco Use  Smoking Status Former   Packs/day: 0.50   Years: 3.00   Pack years: 1.50   Types: Cigarettes   Quit date: 08/10/1990   Years since quitting: 30.1  Smokeless Tobacco Never   BP Readings from Last 3 Encounters:  09/15/20  (!) 146/82  08/09/20 138/90  07/11/20 100/70   Pulse Readings from Last 3 Encounters:  09/15/20 73  08/09/20 79  07/11/20 86   Wt Readings from Last 3 Encounters:  09/22/20 257 lb (116.6 kg)  09/15/20 257 lb (116.6 kg)  08/09/20 265 lb 3.2 oz (120.3 kg)    Assessment: Review of patient past medical history, allergies, medications, health status, including review of consultants reports, laboratory and other test data, was performed as part of comprehensive evaluation and provision of chronic care management services.   SDOH:  (Social Determinants of Health) assessments and interventions performed:  SDOH Interventions    Flowsheet Row Most Recent Value  SDOH Interventions   Physical Activity Interventions Intervention Not Indicated       CCM Care Plan  No Known Allergies  Medications Reviewed Today     Reviewed by Cherre Robins, PharmD (Pharmacist) on 10/06/20 at 57  Med List Status: <None>   Medication Order Taking? Sig Documenting Provider Last Dose Status Informant  ALPRAZolam (XANAX) 0.25 MG tablet [id number] Yes Take 1 tablet (0.25 mg total) by mouth 2 (two) times daily as needed for anxiety. Carollee Herter, Kendrick Fries R, DO Taking Active   atorvastatin (LIPITOR) 40 MG tablet [id number] Yes TAKE 1 TABLET BY MOUTH  DAILY [name], DO Taking Active   blood glucose meter kit and supplies [id number] Yes Dispense based on patient and insurance preference. Use up to four times daily as directed. (FOR ICD-10 E10.9, E11.9). Roma Schanz R, DO Taking Active   Calcium Carbonate-Vitamin D (CALCIUM 500 + D PO) [id number] Yes Take 1 tablet by mouth 2 (two) times daily. [provider] Taking Active Self  fenofibrate 160 MG tablet [id number] Yes TAKE 1 TABLET BY MOUTH  DAILY Carollee Herter, Alferd Apa, DO Taking Active   insulin degludec (TRESIBA) 100 UNIT/ML FlexTouch Pen [id number] Yes Inject 10 Units into the skin daily. Roma Schanz R, DO Taking Active   Insulin  Pen Needle 31G X 5 MM MISC [id number] Yes 1 each by Does not apply route daily. Carollee Herter, Kendrick Fries R, DO Taking Active   levothyroxine (SYNTHROID) 112 MCG tablet [id number] Yes TAKE 1 TABLET BY MOUTH  DAILY BEFORE BREAKFAST [name], DO Taking Active   lisinopril (ZESTRIL) 5 MG tablet [id number] Yes TAKE 1 TABLET BY MOUTH  DAILY [name], DO Taking Active   metFORMIN (GLUCOPHAGE XR) 500 MG 24 hr tablet [id number] Yes Take 2 tablets (1,000 mg total) by mouth daily with breakfast. Carollee Herter, Alferd Apa, DO Taking Active  Multiple Vitamin (MULTIVITAMIN) tablet [id number] Yes Take 1 tablet by mouth daily. [provider] Taking Active Self  Donald Siva test strip [id number] Yes USE AS DIRECTED UP TO 4 TIMES DAILY [name], DO Taking Active   venlafaxine XR (EFFEXOR-XR) 150 MG 24 hr capsule [id number] Yes TAKE 1 CAPSULE BY MOUTH  DAILY Carollee Herter, Alferd Apa, DO Taking Active   Vitamin D, Ergocalciferol, (DRISDOL) 1.25 MG (50000 UNIT) CAPS capsule [id number] Yes TAKE 1 CAPSULE BY MOUTH  EVERY 7 DAYS [name], DO Taking Active   Med List Note Baird Lyons D, MD 07/12/15 1012): CPAP 9 Advanced            Patient Active Problem List   Diagnosis Date Noted   Anxiety 09/02/2020   Back pain 09/02/2020   Constipation 09/02/2020   Diabetes mellitus without complication ([place]) 35/45/6256   Gallbladder problem 09/02/2020   Heart murmur 09/02/2020   Prediabetes 09/02/2020   Spider bite 09/02/2020   Swelling 09/02/2020   Depression with anxiety 07/11/2020   Chest heaviness 07/11/2020   Hyperlipidemia associated with type 2 diabetes mellitus ([place]) 07/11/2020   Primary hypertension 07/11/2020   Class 3 severe obesity with serious comorbidity and body mass index (BMI) of 40.0 to 44.9 in adult ([place]) 06/10/2018   Type 2 diabetes mellitus with hyperglycemia, without long-term current use of insulin ([place]) 06/10/2018   Subungual hematoma of fifth toe  of right foot 10/29/2017   Dysphagia    Abnormal esophagram    Gastroesophageal reflux disease 05/24/2017   Pharyngitis 03/03/2017   Abscess of skin 04/15/2015   Bacterial vaginal infection 12/17/2014   Muscle spasm of back 11/19/2014   Cough 09/17/2013   Bronchitis with bronchospasm 04/14/2013   Hyperlipidemia LDL goal <100 11/06/2012   FATIGUE 06/01/2009   MORBID OBESITY 03/30/2008   DEPRESSION 09/05/2006   Hypothyroidism 05/23/2006   DIZZINESS, CHRONIC 05/23/2006   Obstructive sleep apnea 05/23/2006    Immunization History  Administered Date(s) Administered   Fluad Quad(high Dose 65+) 11/14/2018, 12/21/2019   Influenza Split 11/19/2012   Influenza, High Dose Seasonal PF 12/14/2016, 10/29/2017   Influenza,inj,Quad PF,6+ Mos 11/20/2015   Influenza-Unspecified 12/16/2013   Pneumococcal Conjugate-13 11/19/2014   Pneumococcal Polysaccharide-23 10/30/2013   Tdap 08/01/2010   Zoster Recombinat (Shingrix) 12/23/2017, 04/21/2018   Zoster, Live 08/01/2010    Conditions to be addressed/monitored: HTN, HLD, Hypertriglyceridemia, DMII, Anxiety, Depression, and hypothyroidism  Care Plan : General Pharmacy (Adult)  Updates made by Cherre Robins, PHARMD since 10/06/2020 12:00 AM     Problem: type 2 DM; HDL; HTN;hypothyroidism; depression;   Priority: High  Onset Date: 09/07/2020     Long-Range Goal: Pharmacy goals for chronic care and medication management   Start Date: 09/07/2020  This Visit's Progress: On track  Recent Progress: On track  Priority: High  Note:   Current Barriers:  Unable to maintain control of type 2 diabetes Education needed regarding diabetes goals and medication therapy  Pharmacist Clinical Goal(s):  Over the next 90 days, patient will achieve control of type 2 diabetes as evidenced by A1c < 7.0% maintain control of blood pressure and choelsteorl  as evidenced by BP < 140/90 and LDL <100  adhere to prescribed medication regimen as evidenced by refill  history  through collaboration with PharmD and provider.   Interventions: 1:1 collaboration with Carollee Herter, Alferd Apa, DO regarding development and update of comprehensive plan of care as evidenced by provider attestation and co-signature Inter-disciplinary care team collaboration (  see longitudinal plan of care) Comprehensive medication review performed; medication list updated in electronic medical record  Diabetes: Uncontrolled A1c goal <7.0% Current treatment: Tresiba 10 units daily (started 06/2020) Metformin 556m - take 2 tablets = 100327mdaily Last A1c was 14.3% - very significant increase compared to previous A1c from 05/14/2019 of 6.8% Patient was having chest pain, polyuria and polydipsia when she presented to last OV with PCP and A1c was found to be 14.3% Current glucose readings: 7 day avg = 175; 14 day avg = 198 and 30 day avg = 172 BG has improved greatly - was 400's when [place] Current meal patterns:  breakfast: eggs with cheese a nd 1 slice of whole wheat toast lunch: 1/2 sandwhich with 1 cup of fruit and non starchy vegetable dinner: protein / meat and non starchy vegetable snacks: none - has cut out icecream, cakes and pies drinks: water Current exercise: started going to [employer] days per week for about 60 minutes - stationary bike or elliptical for 20 minutes, then treadmill for 40 minues.  Interventions:  Reviewed home blood glucose readings and reviewed goals  Fasting blood glucose goal (before meals) = 80 to 130 Blood glucose goal after a meal = less than 180  Counseled on low carbohydrate foods / diet; patient has already mad great changes.  Continue to exercise - great job! Goal is at least 150 minutes per week  Hypertension: Controlled goal BP <140/90 current treatment: Lisinopril 27m110maily   BP usually well controlled; likely on lisinopril for renal protection She is seeing cardio 09/15/20 regarding CP / ECHO finding of mild diastolic dysfunction  with SOB Interventions:  Discussed blood pressure goals Discussed benefits of lisinopril for heart and kidneys.  Hyperlipidemia, mixed: LDL at goal of <100; Triglycerides slightly elevated above goal of <150 and HDL below goal of >45  Current treatment: Atorvastatin 56m54mily  Fenofibrate 160mg76mly Current dietary patterns: see above Interventions:  Discussed lipid goals Encouraged to start exercise as planned and continue with current diet changes - I think both of these will help with Tg and HDL.   Depression/Anxiety Controlled Current treatment: Venlafaxine ER 150mg 41my  Alprazolam 0.227mg a59meded for anxiety up to twice a day (patient states she never started this)  Patient report she thinks anxiety was related to elevated BG. She is not taking alprazolam Interventions:  Continue current therapy  Patient Goals/Self-Care Activities Over the next 90 days, patient will:  take medications as prescribed, check glucose 2 to 3 times a day, document, and provide at future appointments, target a minimum of 150 minutes of moderate intensity exercise weekly, and engage in dietary modifications by limiting sweets and carbohydrate intake  Follow Up Plan: Telephone follow up appointment with care management team member scheduled for:  1 month         Medication Assistance: None required.  Patient affirms current coverage meets needs.  Patient's preferred pharmacy is:  [id number] - [id number], [place] - [phone] Mail Service  ([id number]-[address] [phone] Up:  Patient agrees to Care Plan and  Follow-up.  Plan: Telephone follow up appointment with care management team  member scheduled for:  1 month  Cherre Robins, PharmD Clinical Pharmacist [facility]

## 2020-10-06 NOTE — Patient Instructions (Signed)
Visit Information  PATIENT GOALS:  Goals Addressed             This Visit's Progress    Chronic Care Management Pharmacy Care Plan   On track    Current Barriers:  Unable to maintain control of type 2 diabetes Education needed regarding diabetes goals and medication therapy  Pharmacist Clinical Goal(s):  Over the next 90 days, patient will achieve control of type 2 diabetes as evidenced by A1c < 7.0% maintain control of blood pressure and choelsteorl  as evidenced by BP < 140/90 and LDL <100  adhere to prescribed medication regimen as evidenced by refill history through collaboration with PharmD and provider.   Interventions: 1:1 collaboration with Carollee Herter, Alferd Apa, DO regarding development and update of comprehensive plan of care as evidenced by provider attestation and co-signature Inter-disciplinary care team collaboration (see longitudinal plan of care) Comprehensive medication review performed; medication list updated in electronic medical record  Diabetes: Uncontrolled;  A1c goal <7.0% Lab Results  Component Value Date   HGBA1C 14.3 (H) 07/11/2020  Current treatment: Tresiba 10 units daily (started 06/2020) Metformin '500mg'$  - take 2 tablets = '1000mg'$  daily  Interventions:  Reviewed home blood glucose readings and reviewed goals  Fasting blood glucose goal (before meals) = 80 to 130 Blood glucose goal after a meal = less than 180  Counseled on low carbohydrate foods / diet; patient has already mad great changes.  Continue to exercise regularly with goal of at least 150 minutes per week - Great Job!  Hypertension: Controlled; goal BP <140/90 BP Readings from Last 3 Encounters:  09/15/20 (!) 146/82  08/09/20 138/90  07/11/20 100/70  Current treatment: Lisinopril '5mg'$  daily  Interventions:  Discussed blood pressure goals Discussed benefits of lisinopril for heart and kidneys.  Hyperlipidemia, mixed: LDL at goal of <100; Triglycerides slightly elevated above  goal of <150 and HDL below goal of >45 Lipid Panel     Component Value Date/Time   CHOL 148 07/11/2020 0844   CHOL 115 04/14/2018 1019   TRIG 158.0 (H) 07/11/2020 0844   HDL 34.50 (L) 07/11/2020 0844   HDL 40 04/14/2018 1019   CHOLHDL 4 07/11/2020 0844   VLDL 31.6 07/11/2020 0844   LDLCALC 82 07/11/2020 0844   LDLCALC 60 04/14/2018 1019   LDLDIRECT 193.3 11/06/2012 0926   LABVLDL 15 04/14/2018 1019   Current treatment: Atorvastatin '40mg'$  daily  Fenofibrate '160mg'$  daily Interventions:  Discussed lipid goals Encouraged to start exercise as planned and continue with current diet changes - I think both of these will help with Tg and HDL.   Depression/Anxiety Controlled Current treatment: Venlafaxine ER '150mg'$  daily  Alprazolam 0.'25mg'$  as needed for anxiety up to twice a day (patient states she never started this)  Interventions:  Continue current therapy If she continues to not need alprazolam at next check in, will remove from med list.   Patient Goals/Self-Care Activities Over the next 90 days, patient will:  Take medications as prescribed check glucose 2 to 3 times a day, document, and provide at future appointments target a minimum of 150 minutes of moderate intensity exercise weekly, and  continue dietary modifications by limiting sweets and carbohydrate intake  Follow Up Plan: Telephone follow up appointment with care management team member scheduled for:  1 month          The patient verbalized understanding of instructions, educational materials, and care plan provided today and declined offer to receive copy of patient instructions, educational materials, and  care plan.   Telephone follow up appointment with care management team member scheduled for: 1 month  Cherre Robins, PharmD Clinical Pharmacist [facility] Primary Care [facility]

## 2020-10-12 ENCOUNTER — Encounter: Payer: Medicare Other | Admitting: Family Medicine

## 2020-10-17 DIAGNOSIS — E119 Type 2 diabetes mellitus without complications: Secondary | ICD-10-CM | POA: Diagnosis not present

## 2020-10-17 DIAGNOSIS — H5203 Hypermetropia, bilateral: Secondary | ICD-10-CM | POA: Diagnosis not present

## 2020-10-17 DIAGNOSIS — H52203 Unspecified astigmatism, bilateral: Secondary | ICD-10-CM | POA: Diagnosis not present

## 2020-10-17 LAB — HM DIABETES EYE EXAM

## 2020-11-09 ENCOUNTER — Ambulatory Visit (INDEPENDENT_AMBULATORY_CARE_PROVIDER_SITE_OTHER): Payer: Medicare Other | Admitting: Pharmacist

## 2020-11-09 DIAGNOSIS — E782 Mixed hyperlipidemia: Secondary | ICD-10-CM

## 2020-11-09 DIAGNOSIS — E1165 Type 2 diabetes mellitus with hyperglycemia: Secondary | ICD-10-CM

## 2020-11-09 NOTE — Patient Instructions (Signed)
Visit Information  PATIENT GOALS:  Goals Addressed             This Visit's Progress    Chronic Care Management Pharmacy Care Plan   On track    Current Barriers:  Unable to maintain control of type 2 diabetes Education needed regarding diabetes goals and medication therapy  Pharmacist Clinical Goal(s):  Over the next 90 days, patient will achieve control of type 2 diabetes as evidenced by A1c < 7.0% maintain control of blood pressure and choelsteorl  as evidenced by BP < 140/90 and LDL <100  adhere to prescribed medication regimen as evidenced by refill history through collaboration with PharmD and provider.   Interventions: 1:1 collaboration with Carollee Herter, Alferd Apa, DO regarding development and update of comprehensive plan of care as evidenced by provider attestation and co-signature Inter-disciplinary care team collaboration (see longitudinal plan of care) Comprehensive medication review performed; medication list updated in electronic medical record  Diabetes: Uncontrolled;  A1c goal <7.0% Lab Results  Component Value Date   HGBA1C 14.3 (H) 07/11/2020  Current treatment: Tresiba 10 units daily (started 06/2020) Metformin 500mg  - take 2 tablets = 1000mg  daily  Interventions:  Reviewed home blood glucose readings and reviewed goals  Fasting blood glucose goal (before meals) = 80 to 130 Blood glucose goal after a meal = less than 180  Counseled on low carbohydrate foods / diet; patient has already mad great changes.  Continue to exercise regularly with goal of at least 150 minutes per week - Great Job! I will be in touch about changes in medications for your blood glucose after discussing with Dr Etter Sjogren  Hypertension: Controlled; goal BP <140/90 BP Readings from Last 3 Encounters:  09/15/20 (!) 146/82  08/09/20 138/90  07/11/20 100/70  Current treatment: Lisinopril 5mg  daily  Interventions:  Discussed blood pressure goals Discussed benefits of lisinopril for heart  and kidneys.  Hyperlipidemia, mixed: LDL at goal of <100; Triglycerides slightly elevated above goal of <150 and HDL below goal of >45 Lipid Panel     Component Value Date/Time   CHOL 148 07/11/2020 0844   CHOL 115 04/14/2018 1019   TRIG 158.0 (H) 07/11/2020 0844   HDL 34.50 (L) 07/11/2020 0844   HDL 40 04/14/2018 1019   CHOLHDL 4 07/11/2020 0844   VLDL 31.6 07/11/2020 0844   LDLCALC 82 07/11/2020 0844   LDLCALC 60 04/14/2018 1019   LDLDIRECT 193.3 11/06/2012 0926   LABVLDL 15 04/14/2018 1019   Current treatment: Atorvastatin 40mg  daily  Fenofibrate 160mg  daily Interventions:  Discussed lipid goals Updated prescription at mail order pharmacy for atorvastatin Encouraged to start exercise as planned and continue with current diet changes - I think both of these will help with Tg and HDL.   Depression/Anxiety Controlled Current treatment: Venlafaxine ER 150mg  daily  Alprazolam 0.25mg  as needed for anxiety up to twice a day (patient states she never started this)  Interventions:  Continue current therapy If she continues to not need alprazolam at next check in, will remove from med list.   Patient Goals/Self-Care Activities Over the next 90 days, patient will:  Take medications as prescribed check glucose 2 to 3 times a day, document, and provide at future appointments target a minimum of 150 minutes of moderate intensity exercise weekly, and  continue dietary modifications by limiting sweets and carbohydrate intake  Follow Up Plan: Telephone follow up appointment with care management team member scheduled for:  1 month  The patient verbalized understanding of instructions, educational materials, and care plan provided today and declined offer to receive copy of patient instructions, educational materials, and care plan.   Telephone follow up appointment with care management team member scheduled for: 1 month  Cherre Robins, PharmD Clinical Pharmacist [facility]  Primary Care [facility]

## 2020-11-09 NOTE — Chronic Care Management (AMB) (Signed)
Chronic Care Management Pharmacy Note  11/09/2020 Name:  Amy Mahoney MRN:  [id number] DOB:  [birth year]  Summary:  Patient reports blood glucose has not decreased much from average of 170-180 since metformin increased to 1024m ER daily in July 2022. She does report increase in diarrhea since starting higher dose of metformin. She is still taking [place] 10 units daily Hoped to be able to taper off insulin   Plan:  Will consult with PCP about potential options for diabetes. Would recommend lowering dose of metformin back to 5065mER daily to see if diarrhea improves.  A GLP1 or SGLT2 might be a good option to help with blood glucose since also noted to have elevated BMI.  Pioglitazone or sulfonylurea could be used if cost is an issues but has risk of weight gain / edema.   Subjective: Amy Mahoney an [age].o. year old female who is a primary patient of LoAnn HeldDO.  The CCM team was consulted for assistance with disease management and care coordination needs.    Engaged with patient by telephone for follow up visit in response to provider referral for pharmacy case management and/or care coordination services.   Consent to Services:  The patient was given information about Chronic Care Management services, agreed to services, and gave verbal consent prior to initiation of services.  Please see initial visit note for detailed documentation.   Patient Care Team: LoCarollee HerterYvAlferd ApaDO as PCP - General YoDeneise LeverMD as Consulting Physician (Pulmonary Disease) Dermatology, [facility]Nocona [facility] HospitalJaLajuan LinesMD as Consulting Physician (Gastroenterology) EcCherre RobinsPharmD (Pharmacist)  Recent office visits: 07/11/2020 - PCP (Dr LoEtter SjogrenSeen for chest heaviness; found not have very elevated A1c; Tresiba 10 units started. Also started alprazolam 0.2561mp to bid prn  Recent consult visits: 09/22/2020 - Exercise Stress test - per Dr RevGeraldo Pitterunremarkable.   09/15/2020 - Cardio (Dr RevGeraldo Pittereen for CP/ HTN. Ordered exercise stress Cardiolite. Emphasized diet modification, weight loss, exercise. No medication changes noted. 08/09/2020 - Pulm - Dr YouAnnamaria Bootsu OSA - cont to use CPAP  Hospital visits: None in previous 6 months  Objective:  Lab Results  Component Value Date   CREATININE 0.83 07/11/2020   CREATININE 0.90 05/14/2019   CREATININE 0.88 10/06/2018    Lab Results  Component Value Date   HGBA1C 14.3 (H) 07/11/2020   Last diabetic Eye exam:  Lab Results  Component Value Date/Time   HMDIABEYEEXA No Retinopathy 10/17/2020 12:00 AM    Last diabetic Foot exam: No results found for: HMDIABFOOTEX      Component Value Date/Time   CHOL 148 07/11/2020 0844   CHOL 115 04/14/2018 1019   TRIG 158.0 (H) 07/11/2020 0844   HDL 34.50 (L) 07/11/2020 0844   HDL 40 04/14/2018 1019   CHOLHDL 4 07/11/2020 0844   VLDL 31.6 07/11/2020 0844   LDLCALC 82 07/11/2020 0844   LDLCALC 60 04/14/2018 1019   LDLDIRECT 193.3 11/06/2012 0926    Hepatic Function Latest Ref Rng & Units 07/11/2020 05/14/2019 10/06/2018  Total Protein 6.0 - 8.3 g/dL 6.7 6.3 6.7  Albumin 3.5 - 5.2 g/dL 4.1 4.2 4.3  AST 0 - 37 U/L 22 20 28   ALT 0 - 35 U/L 24 17 19   Alk Phosphatase 39 - 117 U/L 64 43 44  Total Bilirubin 0.2 - 1.2 mg/dL 0.4 0.3 0.3  Bilirubin, Direct 0.0 - 0.3 mg/dL - - -  Lab Results  Component Value Date/Time   TSH 2.29 07/11/2020 08:44 AM   TSH 0.46 05/14/2019 04:10 PM   FREET4 1.09 01/01/2018 11:36 AM   FREET4 1.08 01/07/2014 08:54 AM    CBC Latest Ref Rng & Units 07/11/2020 01/01/2018 10/17/2017  WBC 4.0 - 10.5 K/uL 5.8 6.2 6.1  Hemoglobin 12.0 - 15.0 g/dL 13.0 12.5 12.7  Hematocrit 36.0 - 46.0 % 39.1 38.0 38.0  Platelets 150.0 - 400.0 K/uL 294.0 305 281.0    Lab Results  Component Value Date/Time   VD25OH 43.0 10/06/2018 10:39 AM   VD25OH 54.6 04/14/2018 10:19 AM    Clinical ASCVD: No  The 10-year ASCVD risk score (Arnett DK, et  al., 2019) is: 31.4%   Values used to calculate the score:     Age: 17 years     Sex: Female     Is Non-Hispanic African American: No     Diabetic: Yes     Tobacco smoker: No     Systolic Blood Pressure: 308 mmHg     Is BP treated: Yes     HDL Cholesterol: 34.5 mg/dL     Total Cholesterol: 148 mg/dL    Other:  DEXA 12/20/2014 AP Spine L3-L4 - Normal T-Score = 2.5  DualFemur Neck Left - Normal  T-Score = -0.3     Social History   Tobacco Use  Smoking Status Former   Packs/day: 0.50   Years: 3.00   Pack years: 1.50   Types: Cigarettes   Quit date: 08/10/1990   Years since quitting: 30.2  Smokeless Tobacco Never   BP Readings from Last 3 Encounters:  09/15/20 (!) 146/82  08/09/20 138/90  07/11/20 100/70   Pulse Readings from Last 3 Encounters:  09/15/20 73  08/09/20 79  07/11/20 86   Wt Readings from Last 3 Encounters:  09/22/20 257 lb (116.6 kg)  09/15/20 257 lb (116.6 kg)  08/09/20 265 lb 3.2 oz (120.3 kg)    Assessment: Review of patient past medical history, allergies, medications, health status, including review of consultants reports, laboratory and other test data, was performed as part of comprehensive evaluation and provision of chronic care management services.   SDOH:  (Social Determinants of Health) assessments and interventions performed:     CCM Care Plan  No Known Allergies  Medications Reviewed Today     Reviewed by Cherre Robins, PharmD (Pharmacist) on 11/09/20 at 1359  Med List Status: <None>   Medication Order Taking? Sig Documenting Provider Last Dose Status Informant  ALPRAZolam (XANAX) 0.25 MG tablet [id number] Yes Take 1 tablet (0.25 mg total) by mouth 2 (two) times daily as needed for anxiety. Carollee Herter, Kendrick Fries R, DO Taking Active   atorvastatin (LIPITOR) 40 MG tablet [id number] Yes TAKE 1 TABLET BY MOUTH  DAILY [name], DO Taking Active   blood glucose meter kit and supplies [id number] Yes Dispense based on patient and  insurance preference. Use up to four times daily as directed. (FOR ICD-10 E10.9, E11.9). Roma Schanz R, DO Taking Active   Calcium Carbonate-Vitamin D (CALCIUM 500 + D PO) [id number] Yes Take 1 tablet by mouth 2 (two) times daily. [provider] Taking Active Self  fenofibrate 160 MG tablet [id number] Yes TAKE 1 TABLET BY MOUTH  DAILY Carollee Herter, Alferd Apa, DO Taking Active   insulin degludec (TRESIBA) 100 UNIT/ML FlexTouch Pen [id number] Yes Inject 10 Units into the skin daily. Roma Schanz R, DO Taking Active   Insulin Pen Needle  31G X 5 MM MISC [id number] Yes 1 each by Does not apply route daily. Carollee Herter, Kendrick Fries R, DO Taking Active   levothyroxine (SYNTHROID) 112 MCG tablet [id number] Yes TAKE 1 TABLET BY MOUTH  DAILY BEFORE BREAKFAST [name], DO Taking Active   lisinopril (ZESTRIL) 5 MG tablet [id number] Yes TAKE 1 TABLET BY MOUTH  DAILY [name], DO Taking Active   metFORMIN (GLUCOPHAGE XR) 500 MG 24 hr tablet [id number] Yes Take 2 tablets (1,000 mg total) by mouth daily with breakfast. Carollee Herter, Alferd Apa, DO Taking Active   Multiple Vitamin (MULTIVITAMIN) tablet [id number] Yes Take 1 tablet by mouth daily. [provider] Taking Active Self  Donald Siva test strip [id number] Yes USE AS DIRECTED UP TO 4 TIMES DAILY [name], DO Taking Active   venlafaxine XR (EFFEXOR-XR) 150 MG 24 hr capsule [id number] Yes TAKE 1 CAPSULE BY MOUTH  DAILY Carollee Herter, Alferd Apa, DO Taking Active   Vitamin D, Ergocalciferol, (DRISDOL) 1.25 MG (50000 UNIT) CAPS capsule [id number] Yes TAKE 1 CAPSULE BY MOUTH  EVERY 7 DAYS [name], DO Taking Active   Med List Note Baird Lyons D, MD 07/12/15 1012): CPAP 9 Advanced            Patient Active Problem List   Diagnosis Date Noted   Anxiety 09/02/2020   Back pain 09/02/2020   Constipation 09/02/2020   Diabetes mellitus without complication ([place]) 08/81/1031   Gallbladder problem  09/02/2020   Heart murmur 09/02/2020   Prediabetes 09/02/2020   Spider bite 09/02/2020   Swelling 09/02/2020   Depression with anxiety 07/11/2020   Chest heaviness 07/11/2020   Hyperlipidemia associated with type 2 diabetes mellitus ([place]) 07/11/2020   Primary hypertension 07/11/2020   Class 3 severe obesity with serious comorbidity and body mass index (BMI) of 40.0 to 44.9 in adult ([place]) 06/10/2018   Type 2 diabetes mellitus with hyperglycemia, without long-term current use of insulin ([place]) 06/10/2018   Subungual hematoma of fifth toe of right foot 10/29/2017   Dysphagia    Abnormal esophagram    Gastroesophageal reflux disease 05/24/2017   Pharyngitis 03/03/2017   Abscess of skin 04/15/2015   Bacterial vaginal infection 12/17/2014   Muscle spasm of back 11/19/2014   Cough 09/17/2013   Bronchitis with bronchospasm 04/14/2013   Hyperlipidemia LDL goal <100 11/06/2012   FATIGUE 06/01/2009   MORBID OBESITY 03/30/2008   DEPRESSION 09/05/2006   Hypothyroidism 05/23/2006   DIZZINESS, CHRONIC 05/23/2006   Obstructive sleep apnea 05/23/2006    Immunization History  Administered Date(s) Administered   Fluad Quad(high Dose 65+) 11/14/2018, 12/21/2019   Influenza Split 11/19/2012   Influenza, High Dose Seasonal PF 12/14/2016, 10/29/2017   Influenza,inj,Quad PF,6+ Mos 11/20/2015   Influenza-Unspecified 12/16/2013   Pneumococcal Conjugate-13 11/19/2014   Pneumococcal Polysaccharide-23 10/30/2013   Tdap 08/01/2010   Zoster Recombinat (Shingrix) 12/23/2017, 04/21/2018   Zoster, Live 08/01/2010    Conditions to be addressed/monitored: HTN, HLD, Hypertriglyceridemia, DMII, Anxiety, Depression, and hypothyroidism  Care Plan : General Pharmacy (Adult)  Updates made by Cherre Robins, PHARMD since 11/09/2020 12:00 AM     Problem: type 2 DM; HDL; HTN;hypothyroidism; depression;   Priority: High  Onset Date: 09/07/2020     Long-Range Goal: Pharmacy goals for chronic care and medication  management   Start Date: 09/07/2020  Recent Progress: On track  Priority: High  Note:   Current Barriers:  Unable to maintain control of type 2 diabetes Education needed  regarding diabetes goals and medication therapy Low adherence for atorvastatin based on refill history.   Pharmacist Clinical Goal(s):  Over the next 90 days, patient will achieve control of type 2 diabetes as evidenced by A1c < 7.0% maintain control of blood pressure and choelsteorl  as evidenced by BP < 140/90 and LDL <100  adhere to prescribed medication regimen as evidenced by refill history  through collaboration with PharmD and provider.   Interventions: 1:1 collaboration with Carollee Herter, Alferd Apa, DO regarding development and update of comprehensive plan of care as evidenced by provider attestation and co-signature Inter-disciplinary care team collaboration (see longitudinal plan of care) Comprehensive medication review performed; medication list updated in electronic medical record  Diabetes: Uncontrolled A1c goal <7.0% Current treatment: Tresiba 10 units daily (started 06/2020) Metformin 517m ER - take 2 tablets = 10028mdaily Last A1c was 14.3% - very significant increase compared to previous A1c from 05/14/2019 of 6.8% Patient was having chest pain, polyuria and polydipsia when she presented to last OV with PCP and A1c was found to be 14.3% Metformin dose was increased in July form 50082mo 1000m77mily. Patient states blood glucose has not improved much since change and she has had more diarrhea.  Current glucose readings:  BG has improved greatly - was 400's when [place] and [place] However recently blood glucose has stalled at average of 170 to 180 even with increase in metformin Current meal patterns:  breakfast: eggs with cheese a nd 1 slice of whole wheat toast lunch: 1/2 sandwhich with 1 cup of fruit and non starchy vegetable dinner: protein / meat and non starchy vegetable snacks: none - has cut out  icecream, cakes and pies drinks: water Current exercise: going to [employer] 6 days per week for about 60 minutes - stationary bike or elliptical for 20 minutes, then treadmill for 40 minues.  Interventions:  Reviewed home blood glucose readings and reviewed goals  Fasting blood glucose goal (before meals) = 80 to 130 Blood glucose goal after a meal = less than 180  Counseled on low carbohydrate foods / diet; patient has already mad great changes.  Continue to exercise - great job! Goal is at least 150 minutes per week Will consult with PCP about lowering metformin back to 500mg37mly and considering addition of GLP1 or SGLT2 agent (preferred due to CVD benefits and potential to lower weight) though low dose pioglitazone is an option if low cost is needed.    Hypertension: Controlled goal BP <140/90 current treatment: Lisinopril 5mg d37my   BP usually well controlled; likely on lisinopril for renal protection She is seeing cardio 09/15/20 regarding CP / ECHO finding of mild diastolic dysfunction with SOB Interventions:  Discussed blood pressure goals Discussed benefits of lisinopril for heart and kidneys.  Hyperlipidemia, mixed: LDL at goal of <100; Triglycerides slightly elevated above goal of <150 and HDL below goal of >45  Current treatment: Atorvastatin 40mg d75m  Fenofibrate 160mg da84mCurrent dietary patterns: see above Interventions:  Discussed lipid goals Updated prescription at mail order pharmacy for atorvastatin Encouraged to start exercise as planned and continue with current diet changes - I think both of these will help with Tg and HDL.   Depression/Anxiety Controlled Current treatment: Venlafaxine ER 150mg dai51mAlprazolam 0.25mg as n80md for anxiety up to twice a day (patient states she never started this)  Patient report she thinks anxiety was related to elevated BG. She is not taking alprazolam Interventions:  Continue current therapy  Patient  Goals/Self-Care Activities Over the next 90 days, patient will:  take medications as prescribed, check glucose 2 to 3 times a day, document, and provide at future appointments, target a minimum of 150 minutes of moderate intensity exercise weekly, and engage in dietary modifications by limiting sweets and carbohydrate intake  Follow Up Plan: Telephone follow up appointment with care management team member scheduled for:  1 month         Medication Assistance: None required.  Patient affirms current coverage meets needs.  Patient's preferred pharmacy is:  [id number] [place], [address]Phone: [phone]: [phone] [facility]  ([place] WestLHe[facility] [place]ount [place]Phone: [phone]: [address] [place]Phone: [phone]: [phone]  Follow Up:  Patient agrees to Care Plan and Follow-up.  Plan: Telephone follow up appointment with care management team member scheduled for:  1 month  TCherre Robins PharmD Clinical Pharmacist [address] [facility]

## 2020-11-15 ENCOUNTER — Other Ambulatory Visit: Payer: Self-pay | Admitting: Family Medicine

## 2020-11-15 DIAGNOSIS — E1165 Type 2 diabetes mellitus with hyperglycemia: Secondary | ICD-10-CM

## 2020-11-18 DIAGNOSIS — E1165 Type 2 diabetes mellitus with hyperglycemia: Secondary | ICD-10-CM | POA: Diagnosis not present

## 2020-11-18 DIAGNOSIS — E782 Mixed hyperlipidemia: Secondary | ICD-10-CM

## 2020-12-01 ENCOUNTER — Encounter: Payer: Self-pay | Admitting: Family Medicine

## 2020-12-01 ENCOUNTER — Other Ambulatory Visit: Payer: Self-pay

## 2020-12-01 ENCOUNTER — Ambulatory Visit: Payer: Medicare Other | Admitting: Family Medicine

## 2020-12-01 VITALS — BP 126/67 | HR 73 | Ht 65.0 in | Wt 263.0 lb

## 2020-12-01 DIAGNOSIS — Z1231 Encounter for screening mammogram for malignant neoplasm of breast: Secondary | ICD-10-CM

## 2020-12-01 DIAGNOSIS — L439 Lichen planus, unspecified: Secondary | ICD-10-CM

## 2020-12-01 HISTORY — DX: Lichen planus, unspecified: L43.9

## 2020-12-01 MED ORDER — TRIAMCINOLONE ACETONIDE 0.025 % EX OINT: 1.0000 "application " | TOPICAL_OINTMENT | Freq: Two times a day (BID) | CUTANEOUS | 0 refills | Status: DC

## 2020-12-01 NOTE — Progress Notes (Signed)
   Subjective:    Patient ID: Amy Mahoney is a 70 y.o. female presenting with Annual Exam  on 12/01/2020  HPI:  Reports some breaking out on the outside. Has been there for 4 months. Used yeast cream and that did not help. It is at times quite itchy and can get some relief with zinc type diaper rash cream. Does feel some bumps down there. Review of Systems  Constitutional:  Negative for chills and fever.  Respiratory:  Negative for shortness of breath.   Cardiovascular:  Negative for chest pain.  Gastrointestinal:  Negative for abdominal pain, nausea and vomiting.  Genitourinary:  Positive for vaginal pain. Negative for dysuria.  Skin:  Negative for rash.     Objective:    BP 126/67   Pulse 73   Ht 5\' 5"  (1.651 m)   Wt 263 lb (119.3 kg)   BMI 43.77 kg/m  Physical Exam Constitutional:      Appearance: She is well-developed.  HENT:     Head: Normocephalic and atraumatic.  Eyes:     General: No scleral icterus.    Pupils: Pupils are equal, round, and reactive to light.  Neck:     Thyroid: No thyromegaly.  Cardiovascular:     Rate and Rhythm: Normal rate and regular rhythm.  Pulmonary:     Effort: Pulmonary effort is normal.     Breath sounds: Normal breath sounds.  Chest:  Breasts:    Right: Normal. No mass, nipple discharge or skin change.     Left: Normal. No mass, nipple discharge or skin change.  Abdominal:     General: There is no distension.     Palpations: Abdomen is soft.     Tenderness: There is no abdominal tenderness.  Genitourinary:    Comments: Mild erythema on labia majora, chronic skin changes associated with itching. Musculoskeletal:     Cervical back: Normal range of motion.  Skin:    General: Skin is warm and dry.  Neurological:     Mental Status: She is alert and oriented to person, place, and time.        Assessment & Plan:   Problem List Items Addressed This Visit       Unprioritized   Lichen planus    Trial of topical  steroid--continue zinc based creams prn      Relevant Medications   triamcinolone (KENALOG) 0.025 % ointment   Other Visit Diagnoses     Encounter for screening mammogram for malignant neoplasm of breast    -  Primary   Does not need pap smears any longer based on age.   Relevant Orders   MM DIGITAL SCREENING BILATERAL       Return in 1 year (on 12/01/2021).  Donnamae Jude 12/01/2020 5:10 PM

## 2020-12-01 NOTE — Assessment & Plan Note (Signed)
Trial of topical steroid--continue zinc based creams prn

## 2020-12-09 ENCOUNTER — Encounter: Payer: Self-pay | Admitting: Family Medicine

## 2020-12-09 MED ORDER — METFORMIN HCL ER 500 MG PO TB24: 1000.0000 mg | ORAL_TABLET | Freq: Every day | ORAL | 1 refills | Status: DC

## 2020-12-30 ENCOUNTER — Other Ambulatory Visit: Payer: Self-pay | Admitting: Family Medicine

## 2021-01-09 ENCOUNTER — Other Ambulatory Visit: Payer: Self-pay

## 2021-01-10 ENCOUNTER — Ambulatory Visit (INDEPENDENT_AMBULATORY_CARE_PROVIDER_SITE_OTHER): Payer: Medicare Other | Admitting: Family Medicine

## 2021-01-10 ENCOUNTER — Encounter: Payer: Self-pay | Admitting: Family Medicine

## 2021-01-10 VITALS — BP 120/80 | HR 87 | Temp 98.4°F | Resp 18 | Ht 65.0 in | Wt 263.2 lb

## 2021-01-10 DIAGNOSIS — E785 Hyperlipidemia, unspecified: Secondary | ICD-10-CM

## 2021-01-10 DIAGNOSIS — E1165 Type 2 diabetes mellitus with hyperglycemia: Secondary | ICD-10-CM | POA: Diagnosis not present

## 2021-01-10 DIAGNOSIS — Z0000 Encounter for general adult medical examination without abnormal findings: Secondary | ICD-10-CM | POA: Insufficient documentation

## 2021-01-10 DIAGNOSIS — Z23 Encounter for immunization: Secondary | ICD-10-CM

## 2021-01-10 DIAGNOSIS — E1169 Type 2 diabetes mellitus with other specified complication: Secondary | ICD-10-CM

## 2021-01-10 DIAGNOSIS — E2839 Other primary ovarian failure: Secondary | ICD-10-CM | POA: Diagnosis not present

## 2021-01-10 DIAGNOSIS — E559 Vitamin D deficiency, unspecified: Secondary | ICD-10-CM | POA: Diagnosis not present

## 2021-01-10 DIAGNOSIS — I10 Essential (primary) hypertension: Secondary | ICD-10-CM | POA: Diagnosis not present

## 2021-01-10 HISTORY — DX: Encounter for general adult medical examination without abnormal findings: Z00.00

## 2021-01-10 LAB — CBC WITH DIFFERENTIAL/PLATELET
Basophils Absolute: 0.1 10*3/uL (ref 0.0–0.1)
Basophils Relative: 0.7 % (ref 0.0–3.0)
Eosinophils Absolute: 0.6 10*3/uL (ref 0.0–0.7)
Eosinophils Relative: 8.1 % — ABNORMAL HIGH (ref 0.0–5.0)
HCT: 36.9 % (ref 36.0–46.0)
Hemoglobin: 12.2 g/dL (ref 12.0–15.0)
Lymphocytes Relative: 30.2 % (ref 12.0–46.0)
Lymphs Abs: 2.2 10*3/uL (ref 0.7–4.0)
MCHC: 33 g/dL (ref 30.0–36.0)
MCV: 92.1 fl (ref 78.0–100.0)
Monocytes Absolute: 0.4 10*3/uL (ref 0.1–1.0)
Monocytes Relative: 6.1 % (ref 3.0–12.0)
Neutro Abs: 4 10*3/uL (ref 1.4–7.7)
Neutrophils Relative %: 54.9 % (ref 43.0–77.0)
Platelets: 267 10*3/uL (ref 150.0–400.0)
RBC: 4 Mil/uL (ref 3.87–5.11)
RDW: 13.6 % (ref 11.5–15.5)
WBC: 7.3 10*3/uL (ref 4.0–10.5)

## 2021-01-10 LAB — COMPREHENSIVE METABOLIC PANEL
ALT: 23 U/L (ref 0–35)
AST: 27 U/L (ref 0–37)
Albumin: 4.2 g/dL (ref 3.5–5.2)
Alkaline Phosphatase: 39 U/L (ref 39–117)
BUN: 20 mg/dL (ref 6–23)
CO2: 26 mEq/L (ref 19–32)
Calcium: 9.5 mg/dL (ref 8.4–10.5)
Chloride: 104 mEq/L (ref 96–112)
Creatinine, Ser: 0.81 mg/dL (ref 0.40–1.20)
GFR: 73.13 mL/min (ref >60.00)
Glucose, Bld: 169 mg/dL — ABNORMAL HIGH (ref 70–99)
Potassium: 4.5 mEq/L (ref 3.5–5.1)
Sodium: 138 mEq/L (ref 135–145)
Total Bilirubin: 0.3 mg/dL (ref 0.2–1.2)
Total Protein: 6.4 g/dL (ref 6.0–8.3)

## 2021-01-10 LAB — LIPID PANEL
Cholesterol: 133 mg/dL (ref 0–200)
HDL: 35.2 mg/dL — ABNORMAL LOW (ref >39.00)
LDL Cholesterol: 66 mg/dL (ref 0–99)
NonHDL: 97.67
Total CHOL/HDL Ratio: 4
Triglycerides: 157 mg/dL — ABNORMAL HIGH (ref 0.0–149.0)
VLDL: 31.4 mg/dL (ref 0.0–40.0)

## 2021-01-10 LAB — VITAMIN D 25 HYDROXY (VIT D DEFICIENCY, FRACTURES): VITD: 60.35 ng/mL (ref 30.00–100.00)

## 2021-01-10 LAB — HEMOGLOBIN A1C: Hgb A1c MFr Bld: 7.9 % — ABNORMAL HIGH (ref 4.6–6.5)

## 2021-01-10 MED ORDER — TIRZEPATIDE 2.5 MG/0.5ML ~~LOC~~ SOAJ: 2.5000 mg | SUBCUTANEOUS | 0 refills | Status: DC

## 2021-01-10 NOTE — Assessment & Plan Note (Signed)
Encourage heart healthy diet such as MIND or DASH diet, increase exercise, avoid trans fats, simple carbohydrates and processed foods, consider a krill or fish or flaxseed oil cap daily.  °

## 2021-01-10 NOTE — Assessment & Plan Note (Signed)
Well controlled, no changes to meds. Encouraged heart healthy diet such as the DASH diet and exercise as tolerated.  °

## 2021-01-10 NOTE — Progress Notes (Addendum)
Subjective:   By signing my name below, I, [name], attest that this documentation has been prepared under the direction and in the presence of Dr. Roma Schanz, DO. 01/10/2021    Patient ID: Amy Mahoney, female    DOB: 1950/04/13, 70 y.o.   MRN: [id number]  Chief Complaint  Patient presents with   Annual Exam    Pt states not fasting     HPI Patient is in today for a comprehensive physical exam.   Diabetes- She continues taking 1000 mg metformin XR daily PO. She reports her blood sugar levels have not improved. While on metformin XR her blood sugars are low before going to bed and high when waking up.  She notes while she was on instant metformin she experienced frequent diarrhea. Lab Results  Component Value Date   HGBA1C 14.3 (H) 07/11/2020   Blood pressure- Her blood pressure is doing well during this visit.  BP Readings from Last 3 Encounters:  01/10/21 120/80  12/01/20 126/67  09/15/20 (!) 146/82   Pulse Readings from Last 3 Encounters:  01/10/21 87  12/01/20 73  09/15/20 73   GYN- She reports recently seeing a GYN specialist. She was informed of no longer needing regular pap smears.   CPE She denies having any fever, new moles, congestion, sore throat, new muscle pain, new joint pain, chest pain, cough, SOB, wheezing, n/v/d, constipation, blood in stool, dysuria, frequency, hematuria, or headaches at this time. Social history-  Immunizations- She is receiving the flu vaccine during this visit. She is eligible for the pneumonia vaccine and is interested in receiving it. She is due for a tetanus vaccine and is interested in receiving it at her pharmacy. She is not interested in receiving the Covid-19 vaccines.  Exercise- She participates in regular exercise 3x a week by going to a gym and using the stationary bike, elliptical and treadmill.  Diet- She is maintaining a healthy diet.  Colonoscopy- Last completed 01/21/2018. Results showed  Mammogram- Last  completed 01/04/2020. Results are normal. Repeat in 1 year.  Dexa- Last completed 12/20/2014. Results are normal. Repeat in 2 years. Due. She is interested in setting up an appointment at the same day as her mammogram.    Past Medical History:  Diagnosis Date   Anxiety    Back pain    Constipation    Depression    Diabetes mellitus without complication ([place])    Gallbladder problem    Heart murmur    Hyperlipidemia    Hypertension    Prediabetes    Sleep apnea    On CPAP   Spider bite    Swelling    Thyroid disease    hypothyroidism    Past Surgical History:  Procedure Laterality Date   CHOLECYSTECTOMY     DILATION AND CURETTAGE OF UTERUS     ESOPHAGOGASTRODUODENOSCOPY (EGD) WITH PROPOFOL N/A 09/05/2017   Procedure: ESOPHAGOGASTRODUODENOSCOPY (EGD) WITH PROPOFOL;  Surgeon: Jerene Bears, MD;  Location: [facility];  Service: Gastroenterology;  Laterality: N/A;   fibroids scrapping     TONSILLECTOMY      Family History  Problem Relation Age of Onset   Diabetes Mother    Thyroid disease Mother    Depression Mother    Depression Other    Hyperlipidemia Other    Arthritis Other    Diabetes Maternal Aunt    Breast cancer Paternal Grandmother    Lung cancer Maternal Aunt        Nonsmoker  Cancer Maternal Aunt        breast   Breast cancer Maternal Aunt    Cancer Maternal Aunt        lung(nonsmoker)   Colon cancer Neg Hx    Pancreatic cancer Neg Hx    Stomach cancer Neg Hx    Esophageal cancer Neg Hx    Rectal cancer Neg Hx     Social History   Socioeconomic History   Marital status: Divorced    Spouse name: Not on file   Number of children: 0   Years of education: Not on file   Highest education level: Not on file  Occupational History   Occupation: Eps Chartered loss adjuster: [employer]    Employer: [employer]   Occupation: RETIRED  Tobacco Use   Smoking status: Former    Packs/day: 0.50    Years: 3.00    Pack years: 1.50    Types:  Cigarettes    Quit date: 08/10/1990    Years since quitting: 30.4   Smokeless tobacco: Never  Vaping Use   Vaping Use: Never used  Substance and Sexual Activity   Alcohol use: Yes    Comment: rarely   Drug use: No   Sexual activity: Not Currently    Partners: Male  Other Topics Concern   Not on file  Social History Narrative   Regular exercise - yes-- 10,000 steps   Caffeine use: 2 cups of coffee daily   Social Determinants of Health   Financial Resource Strain: Low Risk    Difficulty of Paying Living Expenses: Not hard at all  Food Insecurity: Not on file  Transportation Needs: Not on file  Physical Activity: Sufficiently Active   Days of Exercise per Week: 6 days   Minutes of Exercise per Session: 60 min  Stress: Not on file  Social Connections: Not on file  Intimate Partner Violence: Not on file    Outpatient Medications Prior to Visit  Medication Sig Dispense Refill   atorvastatin (LIPITOR) 40 MG tablet TAKE 1 TABLET BY MOUTH  DAILY 90 tablet 0   blood glucose meter kit and supplies Dispense based on patient and insurance preference. Use up to four times daily as directed. (FOR ICD-10 E10.9, E11.9). 1 each 0   Calcium Carbonate-Vitamin D (CALCIUM 500 + D PO) Take 1 tablet by mouth 2 (two) times daily.     fenofibrate 160 MG tablet TAKE 1 TABLET BY MOUTH  DAILY 90 tablet 3   Insulin Pen Needle 31G X 5 MM MISC 1 each by Does not apply route daily. 90 each 0   levothyroxine (SYNTHROID) 112 MCG tablet TAKE 1 TABLET BY MOUTH  DAILY BEFORE BREAKFAST 90 tablet 1   lisinopril (ZESTRIL) 5 MG tablet TAKE 1 TABLET BY MOUTH  DAILY 90 tablet 1   Multiple Vitamin (MULTIVITAMIN) tablet Take 1 tablet by mouth daily.     ONETOUCH ULTRA test strip USE AS DIRECTED UP TO 4 TIMES DAILY 100 strip 1   TRESIBA FLEXTOUCH 100 UNIT/ML FlexTouch Pen INJECT 10 UNITS INTO THE SKIN DAILY. 3 mL 1   triamcinolone (KENALOG) 0.025 % ointment Apply 1 application topically 2 (two) times daily. 30 g 0    venlafaxine XR (EFFEXOR-XR) 150 MG 24 hr capsule TAKE 1 CAPSULE BY MOUTH  DAILY 90 capsule 1   Vitamin D, Ergocalciferol, (DRISDOL) 1.25 MG (50000 UNIT) CAPS capsule TAKE 1 CAPSULE BY MOUTH  EVERY 7 DAYS 12 capsule 2   metFORMIN (GLUCOPHAGE  XR) 500 MG 24 hr tablet Take 2 tablets (1,000 mg total) by mouth daily with breakfast. 180 tablet 1   ALPRAZolam (XANAX) 0.25 MG tablet Take 1 tablet (0.25 mg total) by mouth 2 (two) times daily as needed for anxiety. (Patient not taking: Reported on 01/10/2021) 20 tablet 0   No facility-administered medications prior to visit.    No Known Allergies  Review of Systems  Constitutional:  Negative for fever.  HENT:  Negative for congestion and sore throat.   Respiratory:  Negative for cough, shortness of breath and wheezing.   Cardiovascular:  Negative for chest pain.  Gastrointestinal:  Negative for blood in stool, constipation, diarrhea, nausea and vomiting.  Genitourinary:  Negative for dysuria, frequency and hematuria.  Musculoskeletal:  Negative for joint pain and myalgias.  Skin:        (-)New moles  Neurological:  Negative for headaches.      Objective:    Physical Exam Constitutional:      General: She is not in acute distress.    Appearance: Normal appearance. She is not ill-appearing.  HENT:     Head: Normocephalic and atraumatic.     Right Ear: Tympanic membrane, ear canal and external ear normal.     Left Ear: Tympanic membrane, ear canal and external ear normal.  Eyes:     Extraocular Movements: Extraocular movements intact.     Pupils: Pupils are equal, round, and reactive to light.  Cardiovascular:     Rate and Rhythm: Normal rate and regular rhythm.     Heart sounds: Murmur heard.    No gallop.  Pulmonary:     Effort: Pulmonary effort is normal. No respiratory distress.     Breath sounds: Normal breath sounds. No wheezing or rales.  Abdominal:     General: Bowel sounds are normal. There is no distension.     Palpations:  Abdomen is soft.     Tenderness: There is no abdominal tenderness. There is no guarding.  Skin:    General: Skin is warm and dry.  Neurological:     Mental Status: She is alert and oriented to person, place, and time.  Psychiatric:        Behavior: Behavior normal.        Judgment: Judgment normal.    BP 120/80 (BP Location: Left Arm, Patient Position: Sitting, Cuff Size: Large)   Pulse 87   Temp 98.4 F (36.9 C) (Oral)   Resp 18   Ht 5' 5"  (1.651 m)   Wt 263 lb 3.2 oz (119.4 kg)   SpO2 97%   BMI 43.80 kg/m  Wt Readings from Last 3 Encounters:  01/10/21 263 lb 3.2 oz (119.4 kg)  12/01/20 263 lb (119.3 kg)  09/22/20 257 lb (116.6 kg)    Diabetic Foot Exam - Simple   No data filed    Lab Results  Component Value Date   WBC 5.8 07/11/2020   HGB 13.0 07/11/2020   HCT 39.1 07/11/2020   PLT 294.0 07/11/2020   GLUCOSE 563 (HH) 07/11/2020   CHOL 148 07/11/2020   TRIG 158.0 (H) 07/11/2020   HDL 34.50 (L) 07/11/2020   LDLDIRECT 193.3 11/06/2012   LDLCALC 82 07/11/2020   ALT 24 07/11/2020   AST 22 07/11/2020   NA 135 07/11/2020   K 4.8 07/11/2020   CL 100 07/11/2020   CREATININE 0.83 07/11/2020   BUN 17 07/11/2020   CO2 27 07/11/2020   TSH 2.29 07/11/2020   HGBA1C 14.3 (H)  07/11/2020   MICROALBUR <0.7 07/11/2020    Lab Results  Component Value Date   TSH 2.29 07/11/2020   Lab Results  Component Value Date   WBC 5.8 07/11/2020   HGB 13.0 07/11/2020   HCT 39.1 07/11/2020   MCV 90.0 07/11/2020   PLT 294.0 07/11/2020   Lab Results  Component Value Date   NA 135 07/11/2020   K 4.8 07/11/2020   CO2 27 07/11/2020   GLUCOSE 563 (HH) 07/11/2020   BUN 17 07/11/2020   CREATININE 0.83 07/11/2020   BILITOT 0.4 07/11/2020   ALKPHOS 64 07/11/2020   AST 22 07/11/2020   ALT 24 07/11/2020   PROT 6.7 07/11/2020   ALBUMIN 4.1 07/11/2020   CALCIUM 9.9 07/11/2020   GFR 71.27 07/11/2020   Lab Results  Component Value Date   CHOL 148 07/11/2020   Lab Results   Component Value Date   HDL 34.50 (L) 07/11/2020   Lab Results  Component Value Date   LDLCALC 82 07/11/2020   Lab Results  Component Value Date   TRIG 158.0 (H) 07/11/2020   Lab Results  Component Value Date   CHOLHDL 4 07/11/2020   Lab Results  Component Value Date   HGBA1C 14.3 (H) 07/11/2020       Assessment & Plan:   Problem List Items Addressed This Visit       Unprioritized   Hyperlipidemia associated with type 2 diabetes mellitus ([place])    Encourage heart healthy diet such as MIND or DASH diet, increase exercise, avoid trans fats, simple carbohydrates and processed foods, consider a krill or fish or flaxseed oil cap daily.       Relevant Medications   tirzepatide (MOUNJARO) 2.5 MG/0.5ML Pen   Other Relevant Orders   Lipid panel   Comprehensive metabolic panel   Preventative health care - Primary    ghm utd Check lab s       Primary hypertension    Well controlled, no changes to meds. Encouraged heart healthy diet such as the DASH diet and exercise as tolerated.       Type 2 diabetes mellitus with hyperglycemia ([facility])   Relevant Medications   tirzepatide (MOUNJARO) 2.5 MG/0.5ML Pen   Other Relevant Orders   Lipid panel   Hemoglobin A1c   Comprehensive metabolic panel   CBC with Differential/Platelet   VITAMIN D 25 Hydroxy (Vit-D Deficiency, Fractures)   Other Visit Diagnoses     Need for influenza vaccination       Relevant Orders   Flu Vaccine QUAD High Dose(Fluad)   Need for pneumococcal vaccination       Relevant Orders   Pneumococcal conjugate vaccine 20-valent (Prevnar 20)   Estrogen deficiency       Relevant Orders   DG Bone Density   Vitamin D deficiency       Relevant Orders   VITAMIN D 25 Hydroxy (Vit-D Deficiency, Fractures)        Meds ordered this encounter  Medications   tirzepatide (MOUNJARO) 2.5 MG/0.5ML Pen    Sig: Inject 2.5 mg into the skin once a week.    Dispense:  2 mL    Refill:  0    I, Dr. Roma Schanz, DO, personally preformed the services described in this documentation.  All medical record entries made by the scribe were at my direction and in my presence.  I have reviewed the chart and discharge instructions (if applicable) and agree that the record reflects my personal performance  and is accurate and complete. 01/10/2021   I,[name],acting as a scribe for [name], DO.,have documented all relevant documentation on the behalf of [name], DO,as directed by  [name], DO while in the presence of [name], DO.   [name], DO

## 2021-01-10 NOTE — Patient Instructions (Signed)
Preventive Care 40 Years and Older, Female Preventive care refers to lifestyle choices and visits with your health care provider that can promote health and wellness. Preventive care visits are also called wellness exams. What can I expect for my preventive care visit? Counseling Your health care provider may ask you questions about your: Medical history, including: Past medical problems. Family medical history. Pregnancy and menstrual history. History of falls. Current health, including: Memory and ability to understand (cognition). Emotional well-being. Home life and relationship well-being. Sexual activity and sexual health. Lifestyle, including: Alcohol, nicotine or tobacco, and drug use. Access to firearms. Diet, exercise, and sleep habits. Work and work Statistician. Sunscreen use. Safety issues such as seatbelt and bike helmet use. Physical exam Your health care provider will check your: Height and weight. These may be used to calculate your BMI (body mass index). BMI is a measurement that tells if you are at a healthy weight. Waist circumference. This measures the distance around your waistline. This measurement also tells if you are at a healthy weight and may help predict your risk of certain diseases, such as type 2 diabetes and high blood pressure. Heart rate and blood pressure. Body temperature. Skin for abnormal spots. What immunizations do I need? Vaccines are usually given at various ages, according to a schedule. Your health care provider will recommend vaccines for you based on your age, medical history, and lifestyle or other factors, such as travel or where you work. What tests do I need? Screening Your health care provider may recommend screening tests for certain conditions. This may include: Lipid and cholesterol levels. Hepatitis C test. Hepatitis B test. HIV (human immunodeficiency virus) test. STI (sexually transmitted infection) testing, if you are at  risk. Lung cancer screening. Colorectal cancer screening. Diabetes screening. This is done by checking your blood sugar (glucose) after you have not eaten for a while (fasting). Mammogram. Talk with your health care provider about how often you should have regular mammograms. BRCA-related cancer screening. This may be done if you have a family history of breast, ovarian, tubal, or peritoneal cancers. Bone density scan. This is done to screen for osteoporosis. Talk with your health care provider about your test results, treatment options, and if necessary, the need for more tests. Follow these instructions at home: Eating and drinking  Eat a diet that includes fresh fruits and vegetables, whole grains, lean protein, and low-fat dairy products. Limit your intake of foods with high amounts of sugar, saturated fats, and salt. Take vitamin and mineral supplements as recommended by your health care provider. Do not drink alcohol if your health care provider tells you not to drink. If you drink alcohol: Limit how much you have to 0-1 drink a day. Know how much alcohol is in your drink. In the [place] one drink equals one 12 oz bottle of beer (355 mL), one 5 oz glass of wine (148 mL), or one 1 oz glass of hard liquor (44 mL). Lifestyle Brush your teeth every morning and night with fluoride toothpaste. Floss one time each day. Exercise for at least 30 minutes 5 or more days each week. Do not use any products that contain nicotine or tobacco. These products include cigarettes, chewing tobacco, and vaping devices, such as e-cigarettes. If you need help quitting, ask your health care provider. Do not use drugs. If you are sexually active, practice safe sex. Use a condom or other form of protection in order to prevent STIs. Take aspirin only as told by your  health care provider. Make sure that you understand how much to take and what form to take. Work with your health care provider to find out whether it  is safe and beneficial for you to take aspirin daily. Ask your health care provider if you need to take a cholesterol-lowering medicine (statin). Find healthy ways to manage stress, such as: Meditation, yoga, or listening to music. Journaling. Talking to a trusted person. Spending time with friends and family. Minimize exposure to UV radiation to reduce your risk of skin cancer. Safety Always wear your seat belt while driving or riding in a vehicle. Do not drive: If you have been drinking alcohol. Do not ride with someone who has been drinking. When you are tired or distracted. While texting. If you have been using any mind-altering substances or drugs. Wear a helmet and other protective equipment during sports activities. If you have firearms in your house, make sure you follow all gun safety procedures. What's next? Visit your health care provider once a year for an annual wellness visit. Ask your health care provider how often you should have your eyes and teeth checked. Stay up to date on all vaccines. This information is not intended to replace advice given to you by your health care provider. Make sure you discuss any questions you have with your health care provider. Document Revised: 08/03/2020 Document Reviewed: 08/03/2020 Elsevier Patient Education  [place].

## 2021-01-10 NOTE — Assessment & Plan Note (Signed)
ghm utd Check labs  

## 2021-01-26 ENCOUNTER — Encounter: Payer: Self-pay | Admitting: Family Medicine

## 2021-01-26 ENCOUNTER — Other Ambulatory Visit: Payer: Self-pay | Admitting: Family Medicine

## 2021-01-26 DIAGNOSIS — E038 Other specified hypothyroidism: Secondary | ICD-10-CM

## 2021-01-30 ENCOUNTER — Other Ambulatory Visit: Payer: Self-pay | Admitting: Family Medicine

## 2021-02-05 ENCOUNTER — Other Ambulatory Visit: Payer: Self-pay | Admitting: Family Medicine

## 2021-02-05 DIAGNOSIS — E1165 Type 2 diabetes mellitus with hyperglycemia: Secondary | ICD-10-CM

## 2021-02-21 ENCOUNTER — Other Ambulatory Visit: Payer: Self-pay | Admitting: Family Medicine

## 2021-02-21 DIAGNOSIS — F321 Major depressive disorder, single episode, moderate: Secondary | ICD-10-CM

## 2021-02-23 ENCOUNTER — Other Ambulatory Visit: Payer: Self-pay

## 2021-02-23 DIAGNOSIS — E785 Hyperlipidemia, unspecified: Secondary | ICD-10-CM | POA: Insufficient documentation

## 2021-02-24 ENCOUNTER — Other Ambulatory Visit: Payer: Self-pay | Admitting: Family Medicine

## 2021-02-24 DIAGNOSIS — E559 Vitamin D deficiency, unspecified: Secondary | ICD-10-CM

## 2021-02-24 NOTE — Telephone Encounter (Signed)
Would you like Pt to continue Ergocalciferol?  

## 2021-03-01 ENCOUNTER — Encounter: Payer: Self-pay | Admitting: Cardiology

## 2021-03-02 ENCOUNTER — Ambulatory Visit: Payer: Medicare Other | Admitting: Cardiology

## 2021-03-20 ENCOUNTER — Other Ambulatory Visit: Payer: Self-pay | Admitting: Family Medicine

## 2021-03-20 DIAGNOSIS — E785 Hyperlipidemia, unspecified: Secondary | ICD-10-CM

## 2021-03-27 ENCOUNTER — Telehealth: Payer: Self-pay | Admitting: Family Medicine

## 2021-03-27 NOTE — Telephone Encounter (Signed)
Left message for patient to call back and schedule Medicare Annual Wellness Visit (AWV) in office.  ° °If not able to come in office, please offer to do virtually or by telephone.  Left office number and my jabber #[phone]. ° °Due for AWVI ° °Please schedule at anytime with Nurse Health Advisor. °  °

## 2021-04-26 ENCOUNTER — Other Ambulatory Visit: Payer: Self-pay

## 2021-04-26 ENCOUNTER — Encounter (HOSPITAL_BASED_OUTPATIENT_CLINIC_OR_DEPARTMENT_OTHER): Payer: Self-pay

## 2021-04-26 ENCOUNTER — Ambulatory Visit (HOSPITAL_BASED_OUTPATIENT_CLINIC_OR_DEPARTMENT_OTHER)
Admission: RE | Admit: 2021-04-26 | Discharge: 2021-04-26 | Disposition: A | Discharge status: Home / Self Care | Payer: Medicare Other | Source: Ambulatory Visit | Attending: Family Medicine | Admitting: Family Medicine

## 2021-04-26 DIAGNOSIS — Z1231 Encounter for screening mammogram for malignant neoplasm of breast: Secondary | ICD-10-CM | POA: Insufficient documentation

## 2021-05-02 ENCOUNTER — Other Ambulatory Visit: Payer: Self-pay | Admitting: Family Medicine

## 2021-05-02 DIAGNOSIS — E1165 Type 2 diabetes mellitus with hyperglycemia: Secondary | ICD-10-CM

## 2021-05-18 ENCOUNTER — Ambulatory Visit (INDEPENDENT_AMBULATORY_CARE_PROVIDER_SITE_OTHER): Payer: Medicare Other

## 2021-05-18 VITALS — Ht 65.0 in | Wt 255.0 lb

## 2021-05-18 DIAGNOSIS — Z0000 Encounter for general adult medical examination without abnormal findings: Secondary | ICD-10-CM | POA: Diagnosis not present

## 2021-05-18 NOTE — Progress Notes (Signed)
? ?Subjective:  ? Amy Mahoney is a 71 y.o. female who presents for an Initial Medicare Annual Wellness Visit. ?Virtual Visit via Telephone Note ? ?I connected with  Amy Mahoney on 05/18/21 at 11:45 AM EDT by telephone and verified that I am speaking with the correct person using two identifiers. ? ?Location: ?Patient: HOME ?Provider: [facility] ?Persons participating in the virtual visit: patient/Nurse Health Advisor ?  ?I discussed the limitations, risks, security and privacy concerns of performing an evaluation and management service by telephone and the availability of in person appointments. The patient expressed understanding and agreed to proceed. ? ?Interactive audio and video telecommunications were attempted between this nurse and patient, however failed, due to patient having technical difficulties OR patient did not have access to video capability.  We continued and completed visit with audio only. ? ?Some vital signs may be absent or patient reported.  ? ?Chriss Driver, LPN ? ?Review of Systems    ? ?Cardiac Risk Factors include: advanced age (>98mn, >>64women);diabetes mellitus;hypertension;dyslipidemia;sedentary lifestyle;obesity (BMI >30kg/m2) ? ?   ?Objective:  ?  ?Today's Vitals  ? 05/18/21 1150  ?Weight: 255 lb (115.7 kg)  ?Height: '5\' 5"'$  (1.651 m)  ? ?Body mass index is 42.43 kg/m?. ? ? ?  05/18/2021  ? 11:59 AM 09/05/2017  ?  8:04 AM 07/30/2017  ?  7:29 AM 02/09/2017  ?  7:48 AM 07/01/2014  ?  2:35 PM 12/26/2013  ?  1:20 PM  ?Advanced Directives  ?Does Patient Have a Medical Advance Directive? Yes Yes Yes No Yes No  ?Type of [place]of [place] will [place] will Healthcare Power of [place] will   ?Copy of [place] Chart? No - copy requested No - copy requested      ?Would patient like information on creating a medical advance directive?      No - patient declined  information  ? ? ?Current Medications (verified) ?Outpatient Encounter Medications as of 05/18/2021  ?Medication Sig  ? atorvastatin (LIPITOR) 40 MG tablet TAKE 1 TABLET BY MOUTH  DAILY  ? B-D UF III MINI PEN NEEDLES 31G X 5 MM MISC See admin instructions.  ? Calcium Carbonate-Vitamin D (CALCIUM 500 + D PO) Take 1 tablet by mouth 2 (two) times daily.  ? fenofibrate 160 MG tablet TAKE 1 TABLET BY MOUTH  DAILY  ? insulin degludec (TRESIBA) 100 UNIT/ML FlexTouch Pen Inject 10 Units into the skin daily.  ? levothyroxine (SYNTHROID) 112 MCG tablet TAKE 1 TABLET BY MOUTH  DAILY BEFORE BREAKFAST  ? lisinopril (ZESTRIL) 5 MG tablet TAKE 1 TABLET BY MOUTH  DAILY  ? Multiple Vitamin (MULTIVITAMIN) tablet Take 1 tablet by mouth daily.  ? omeprazole (PRILOSEC) 40 MG capsule Take 40 mg by mouth daily.  ? ONETOUCH ULTRA test strip USE AS DIRECTED UP TO 4 TIMES DAILY  ? tirzepatide ([facility] 2.5 MG/0.5ML Pen Inject 2.5 mg into the skin once a week.  ? triamcinolone (KENALOG) 0.025 % ointment Apply 1 application topically 2 (two) times daily.  ? venlafaxine XR (EFFEXOR-XR) 150 MG 24 hr capsule TAKE 1 CAPSULE BY MOUTH  DAILY  ? Vitamin D, Ergocalciferol, (DRISDOL) 1.25 MG (50000 UNIT) CAPS capsule TAKE 1 CAPSULE BY MOUTH  EVERY 7 DAYS  ? ?No facility-administered encounter medications on file as of 05/18/2021.  ? ? ?Allergies (verified) ?Patient has no known allergies.  ? ?History: ?Past Medical History:  ?  Diagnosis Date  ? Abnormal esophagram   ? Abscess of skin 04/15/2015  ? Anxiety   ? Back pain   ? Bacterial vaginal infection 12/17/2014  ? Bronchitis with bronchospasm 04/14/2013  ? Chest heaviness 07/11/2020  ? Class 3 severe obesity with serious comorbidity and body mass index (BMI) of 40.0 to 44.9 in adult [facility]) 06/10/2018  ? Constipation   ? Cough 09/17/2013  ? DEPRESSION 09/05/2006  ? Qualifier: Diagnosis of  By: Jerold Coombe    ? Depression with anxiety 07/11/2020  ? Diabetes mellitus without complication ([place])   ? DIZZINESS,  CHRONIC 05/23/2006  ? Qualifier: Diagnosis of  By: Jerold Coombe    ? Dysphagia   ? FATIGUE 06/01/2009  ? Qualifier: Diagnosis of  By: Jerold Coombe    ? Gallbladder problem   ? Gastroesophageal reflux disease 05/24/2017  ? Heart murmur   ? Hyperlipidemia   ? Hyperlipidemia associated with type 2 diabetes mellitus ([place]) 07/11/2020  ? Hyperlipidemia LDL goal <100 11/06/2012  ? Hypothyroidism 05/23/2006  ? Qualifier: Diagnosis of  By: Jerold Coombe    ? Lichen planus 59/93/5701  ? MORBID OBESITY 03/30/2008  ? Qualifier: Diagnosis of  By: Jerold Coombe    ? Muscle spasm of back 11/19/2014  ? Obstructive sleep apnea 05/23/2006  ? NPSG 08/03/94 RDI/AHI 49/hr, weight was 285 pounds CPAP/ Advanced   ? Pharyngitis 03/03/2017  ? Post-nasal drainage 08/03/2019  ? Last Assessment & Plan:  Formatting of this note might be different from the original. Concern over postnasal drainage. Chronic at least a year history of the above but over the last month seems to have worsened.  She has tried antihistamine therapy without improvement.  Denies obvious heartburn.  Some associated cough and occasional near emesis episodes.  She is not on an ACE inhibitor. EXAM show  ? Prediabetes   ? Preventative health care 01/10/2021  ? Primary hypertension 07/11/2020  ? Spider bite   ? Subungual hematoma of fifth toe of right foot 10/29/2017  ? Swelling   ? Type 2 diabetes mellitus with hyperglycemia ([place]) 06/10/2018  ? ?Past Surgical History:  ?Procedure Laterality Date  ? CHOLECYSTECTOMY    ? DILATION AND CURETTAGE OF UTERUS    ? ESOPHAGOGASTRODUODENOSCOPY (EGD) WITH PROPOFOL N/A 09/05/2017  ? Procedure: ESOPHAGOGASTRODUODENOSCOPY (EGD) WITH PROPOFOL;  Surgeon: Jerene Bears, MD;  Location: [facility];  Service: Gastroenterology;  Laterality: N/A;  ? fibroids scrapping    ? TONSILLECTOMY    ? ?Family History  ?Problem Relation Age of Onset  ? Diabetes Mother   ? Thyroid disease Mother   ? Depression Mother   ? Depression Other   ? Hyperlipidemia Other    ? Arthritis Other   ? Diabetes Maternal Aunt   ? Breast cancer Paternal Grandmother   ? Lung cancer Maternal Aunt   ?     Nonsmoker  ? Cancer Maternal Aunt   ?     breast  ? Breast cancer Maternal Aunt   ? Cancer Maternal Aunt   ?     lung(nonsmoker)  ? Colon cancer Neg Hx   ? Pancreatic cancer Neg Hx   ? Stomach cancer Neg Hx   ? Esophageal cancer Neg Hx   ? Rectal cancer Neg Hx   ? ?Social History  ? ?Socioeconomic History  ? Marital status: Divorced  ?  Spouse name: Not on file  ? Number of children: 0  ? Years of education: Not on file  ?  Highest education level: Not on file  ?Occupational History  ? 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  Employer: [employer]  ?  Employer: [employer]  ? Occupation: RETIRED  ?Tobacco Use  ? Smoking status: Former  ?  Packs/day: 0.50  ?  Years: 3.00  ?  Pack years: 1.50  ?  Types: Cigarettes  ?  Quit date: 08/10/1990  ?  Years since quitting: 30.7  ? Smokeless tobacco: Never  ?Vaping Use  ? Vaping Use: Never used  ?Substance and Sexual Activity  ? Alcohol use: Yes  ?  Comment: rarely  ? Drug use: No  ? Sexual activity: Not Currently  ?  Partners: Male  ?Other Topics Concern  ? Not on file  ?Social History Narrative  ? Regular exercise - yes-- 10,000 steps  ? Caffeine use: 2 cups of coffee daily  ? ?Social Determinants of Health  ? ?Financial Resource Strain: Low Risk   ? Difficulty of Paying Living Expenses: Not hard at all  ?Food Insecurity: No Food Insecurity  ? Worried About Charity fundraiser in the Last Year: Never true  ? Ran Out of Food in the Last Year: Never true  ?Transportation Needs: No Transportation Needs  ? Lack of Transportation (Medical): No  ? Lack of Transportation (Non-Medical): No  ?Physical Activity: Sufficiently Active  ? Days of Exercise per Week: 5 days  ? Minutes of Exercise per Session: 40 min  ?Stress: No Stress Concern Present  ? Feeling of Stress : Not at all  ?Social Connections: Moderately Integrated  ? Frequency of Communication with Friends  and Family: More than three times a week  ? Frequency of Social Gatherings with Friends and Family: More than three times a week  ? Attends Religious Services: More than 4 times per year  ? Active Member of [employer]

## 2021-05-18 NOTE — Patient Instructions (Signed)
Amy Mahoney , ?Thank you for taking time to come for your Medicare Wellness Visit. I appreciate your ongoing commitment to your health goals. Please review the following plan we discussed and let me know if I can assist you in the future.  ? ?Screening recommendations/referrals: ?Colonoscopy: Done 01/21/2018 Repeat in 10 years ? ?Mammogram: Done 04/26/2021 Repeat annually ? ?Bone Density: Done 12/20/2014 Repeat every 2 years ? ?Recommended yearly ophthalmology/optometry visit for glaucoma screening and checkup ?Recommended yearly dental visit for hygiene and checkup ? ?Vaccinations: ?Influenza vaccine: Done 01/10/2021 Repeat annually ? ?Pneumococcal vaccine: Done 01/10/2021, 11/19/2014 and 10/30/2013 ?Tdap vaccine: Done 08/01/2010 Repeat in 10 years ? ?Shingles vaccine: Done 08/01/2010, 12/23/2017 and 04/21/2018   ?Covid-19:Declined. ? ?Advanced directives: Please bring a copy of your health care power of attorney and living will to the office to be added to your chart at your convenience. ? ? ?Conditions/risks identified: Aim for 30 minutes of exercise or brisk walking, 6-8 glasses of water, and 5 servings of fruits and vegetables each day. ?KEEP UP THE GOOD WORK!! ? ?Next appointment: Follow up in one year for your annual wellness visit 2024. ? ? ?Preventive Care 67 Years and Older, Female ?Preventive care refers to lifestyle choices and visits with your health care provider that can promote health and wellness. ?What does preventive care include? ?A yearly physical exam. This is also called an annual well check. ?Dental exams once or twice a year. ?Routine eye exams. Ask your health care provider how often you should have your eyes checked. ?Personal lifestyle choices, including: ?Daily care of your teeth and gums. ?Regular physical activity. ?Eating a healthy diet. ?Avoiding tobacco and drug use. ?Limiting alcohol use. ?Practicing safe sex. ?Taking low-dose aspirin every day. ?Taking vitamin and mineral supplements as  recommended by your health care provider. ?What happens during an annual well check? ?The services and screenings done by your health care provider during your annual well check will depend on your age, overall health, lifestyle risk factors, and family history of disease. ?Counseling  ?Your health care provider may ask you questions about your: ?Alcohol use. ?Tobacco use. ?Drug use. ?Emotional well-being. ?Home and relationship well-being. ?Sexual activity. ?Eating habits. ?History of falls. ?Memory and ability to understand (cognition). ?Work and work Statistician. ?Reproductive health. ?Screening  ?You may have the following tests or measurements: ?Height, weight, and BMI. ?Blood pressure. ?Lipid and cholesterol levels. These may be checked every 5 years, or more frequently if you are over 90 years old. ?Skin check. ?Lung cancer screening. You may have this screening every year starting at age 72 if you have a 30-pack-year history of smoking and currently smoke or have quit within the past 15 years. ?Fecal occult blood test (FOBT) of the stool. You may have this test every year starting at age 29. ?Flexible sigmoidoscopy or colonoscopy. You may have a sigmoidoscopy every 5 years or a colonoscopy every 10 years starting at age 109. ?Hepatitis C blood test. ?Hepatitis B blood test. ?Sexually transmitted disease (STD) testing. ?Diabetes screening. This is done by checking your blood sugar (glucose) after you have not eaten for a while (fasting). You may have this done every 1-3 years. ?Bone density scan. This is done to screen for osteoporosis. You may have this done starting at age 20. ?Mammogram. This may be done every 1-2 years. Talk to your health care provider about how often you should have regular mammograms. ?Talk with your health care provider about your test results, treatment options, and if  necessary, the need for more tests. ?Vaccines  ?Your health care provider may recommend certain vaccines, such  as: ?Influenza vaccine. This is recommended every year. ?Tetanus, diphtheria, and acellular pertussis (Tdap, Td) vaccine. You may need a Td booster every 10 years. ?Zoster vaccine. You may need this after age 68. ?Pneumococcal 13-valent conjugate (PCV13) vaccine. One dose is recommended after age 69. ?Pneumococcal polysaccharide (PPSV23) vaccine. One dose is recommended after age 39. ?Talk to your health care provider about which screenings and vaccines you need and how often you need them. ?This information is not intended to replace advice given to you by your health care provider. Make sure you discuss any questions you have with your health care provider. ?Document Released: 03/04/2015 Document Revised: 10/26/2015 Document Reviewed: 12/07/2014 ?Elsevier Interactive Patient Education ? 2017 [place]. ? ?Fall Prevention in the Home ?Falls can cause injuries. They can happen to people of all ages. There are many things you can do to make your home safe and to help prevent falls. ?What can I do on the outside of my home? ?Regularly fix the edges of walkways and driveways and fix any cracks. ?Remove anything that might make you trip as you walk through a door, such as a raised step or threshold. ?Trim any bushes or trees on the path to your home. ?Use bright outdoor lighting. ?Clear any walking paths of anything that might make someone trip, such as rocks or tools. ?Regularly check to see if handrails are loose or broken. Make sure that both sides of any steps have handrails. ?Any raised decks and porches should have guardrails on the edges. ?Have any leaves, snow, or ice cleared regularly. ?Use sand or salt on walking paths during winter. ?Clean up any spills in your garage right away. This includes oil or grease spills. ?What can I do in the bathroom? ?Use night lights. ?Install grab bars by the toilet and in the tub and shower. Do not use towel bars as grab bars. ?Use non-skid mats or decals in the tub or  shower. ?If you need to sit down in the shower, use a plastic, non-slip stool. ?Keep the floor dry. Clean up any water that spills on the floor as soon as it happens. ?Remove soap buildup in the tub or shower regularly. ?Attach bath mats securely with double-sided non-slip rug tape. ?Do not have throw rugs and other things on the floor that can make you trip. ?What can I do in the bedroom? ?Use night lights. ?Make sure that you have a light by your bed that is easy to reach. ?Do not use any sheets or blankets that are too big for your bed. They should not hang down onto the floor. ?Have a firm chair that has side arms. You can use this for support while you get dressed. ?Do not have throw rugs and other things on the floor that can make you trip. ?What can I do in the kitchen? ?Clean up any spills right away. ?Avoid walking on wet floors. ?Keep items that you use a lot in easy-to-reach places. ?If you need to reach something above you, use a strong step stool that has a grab bar. ?Keep electrical cords out of the way. ?Do not use floor polish or wax that makes floors slippery. If you must use wax, use non-skid floor wax. ?Do not have throw rugs and other things on the floor that can make you trip. ?What can I do with my stairs? ?Do not leave any items  on the stairs. ?Make sure that there are handrails on both sides of the stairs and use them. Fix handrails that are broken or loose. Make sure that handrails are as long as the stairways. ?Check any carpeting to make sure that it is firmly attached to the stairs. Fix any carpet that is loose or worn. ?Avoid having throw rugs at the top or bottom of the stairs. If you do have throw rugs, attach them to the floor with carpet tape. ?Make sure that you have a light switch at the top of the stairs and the bottom of the stairs. If you do not have them, ask someone to add them for you. ?What else can I do to help prevent falls? ?Wear shoes that: ?Do not have high heels. ?Have  rubber bottoms. ?Are comfortable and fit you well. ?Are closed at the toe. Do not wear sandals. ?If you use a stepladder: ?Make sure that it is fully opened. Do not climb a closed stepladder. ?Make sure that both

## 2021-06-05 ENCOUNTER — Encounter: Payer: Self-pay | Admitting: Family Medicine

## 2021-06-08 DIAGNOSIS — G4733 Obstructive sleep apnea (adult) (pediatric): Secondary | ICD-10-CM | POA: Diagnosis not present

## 2021-06-23 ENCOUNTER — Other Ambulatory Visit: Payer: Self-pay | Admitting: Family Medicine

## 2021-06-23 DIAGNOSIS — E785 Hyperlipidemia, unspecified: Secondary | ICD-10-CM

## 2021-07-04 ENCOUNTER — Other Ambulatory Visit: Payer: Self-pay | Admitting: Family Medicine

## 2021-07-04 DIAGNOSIS — E038 Other specified hypothyroidism: Secondary | ICD-10-CM

## 2021-07-28 ENCOUNTER — Encounter: Payer: Self-pay | Admitting: Family Medicine

## 2021-07-28 ENCOUNTER — Ambulatory Visit (INDEPENDENT_AMBULATORY_CARE_PROVIDER_SITE_OTHER): Payer: Medicare Other | Admitting: Family Medicine

## 2021-07-28 ENCOUNTER — Other Ambulatory Visit: Payer: Self-pay | Admitting: Family Medicine

## 2021-07-28 VITALS — BP 124/78 | HR 75 | Temp 98.2°F | Resp 18 | Ht 65.0 in | Wt 261.6 lb

## 2021-07-28 DIAGNOSIS — I10 Essential (primary) hypertension: Secondary | ICD-10-CM | POA: Diagnosis not present

## 2021-07-28 DIAGNOSIS — E785 Hyperlipidemia, unspecified: Secondary | ICD-10-CM

## 2021-07-28 DIAGNOSIS — E1165 Type 2 diabetes mellitus with hyperglycemia: Secondary | ICD-10-CM

## 2021-07-28 DIAGNOSIS — E038 Other specified hypothyroidism: Secondary | ICD-10-CM | POA: Diagnosis not present

## 2021-07-28 DIAGNOSIS — E1169 Type 2 diabetes mellitus with other specified complication: Secondary | ICD-10-CM | POA: Diagnosis not present

## 2021-07-28 LAB — COMPREHENSIVE METABOLIC PANEL
ALT: 20 U/L (ref 0–35)
AST: 27 U/L (ref 0–37)
Albumin: 4.1 g/dL (ref 3.5–5.2)
Alkaline Phosphatase: 49 U/L (ref 39–117)
BUN: 19 mg/dL (ref 6–23)
CO2: 28 mEq/L (ref 19–32)
Calcium: 9.4 mg/dL (ref 8.4–10.5)
Chloride: 105 mEq/L (ref 96–112)
Creatinine, Ser: 0.72 mg/dL (ref 0.40–1.20)
GFR: 83.91 mL/min (ref >60.00)
Glucose, Bld: 174 mg/dL — ABNORMAL HIGH (ref 70–99)
Potassium: 5.1 mEq/L (ref 3.5–5.1)
Sodium: 139 mEq/L (ref 135–145)
Total Bilirubin: 0.5 mg/dL (ref 0.2–1.2)
Total Protein: 6.3 g/dL (ref 6.0–8.3)

## 2021-07-28 LAB — CBC WITH DIFFERENTIAL/PLATELET
Basophils Absolute: 0.1 10*3/uL (ref 0.0–0.1)
Basophils Relative: 0.9 % (ref 0.0–3.0)
Eosinophils Absolute: 0.3 10*3/uL (ref 0.0–0.7)
Eosinophils Relative: 6 % — ABNORMAL HIGH (ref 0.0–5.0)
HCT: 36.3 % (ref 36.0–46.0)
Hemoglobin: 12 g/dL (ref 12.0–15.0)
Lymphocytes Relative: 28.8 % (ref 12.0–46.0)
Lymphs Abs: 1.7 10*3/uL (ref 0.7–4.0)
MCHC: 33.1 g/dL (ref 30.0–36.0)
MCV: 90.9 fl (ref 78.0–100.0)
Monocytes Absolute: 0.5 10*3/uL (ref 0.1–1.0)
Monocytes Relative: 8.8 % (ref 3.0–12.0)
Neutro Abs: 3.2 10*3/uL (ref 1.4–7.7)
Neutrophils Relative %: 55.5 % (ref 43.0–77.0)
Platelets: 266 10*3/uL (ref 150.0–400.0)
RBC: 4 Mil/uL (ref 3.87–5.11)
RDW: 14.1 % (ref 11.5–15.5)
WBC: 5.7 10*3/uL (ref 4.0–10.5)

## 2021-07-28 LAB — LIPID PANEL
Cholesterol: 120 mg/dL (ref 0–200)
HDL: 31.1 mg/dL — ABNORMAL LOW (ref >39.00)
LDL Cholesterol: 65 mg/dL (ref 0–99)
NonHDL: 88.74
Total CHOL/HDL Ratio: 4
Triglycerides: 119 mg/dL (ref 0.0–149.0)
VLDL: 23.8 mg/dL (ref 0.0–40.0)

## 2021-07-28 LAB — TSH: TSH: 0.83 u[IU]/mL (ref 0.35–5.50)

## 2021-07-28 LAB — HEMOGLOBIN A1C: Hgb A1c MFr Bld: 7.5 % — ABNORMAL HIGH (ref 4.6–6.5)

## 2021-07-28 NOTE — Progress Notes (Signed)
Established Patient Office Visit  Subjective   Patient ID: Amy Mahoney, female    DOB: 10-01-1950  Age: 71 y.o. MRN: [id number]  Chief Complaint  Patient presents with   Hypertension   Hyperlipidemia   Diabetes   Follow-up    HPI  Patient Active Problem List   Diagnosis Date Noted   Hyperlipidemia 02/23/2021   Preventative health care 00/93/8182   Lichen planus 99/37/1696   Anxiety 09/02/2020   Back pain 09/02/2020   Constipation 09/02/2020   Diabetes mellitus without complication ([place]) 78/93/8101   Gallbladder problem 09/02/2020   Heart murmur 09/02/2020   Prediabetes 09/02/2020   Spider bite 09/02/2020   Swelling 09/02/2020   Depression with anxiety 07/11/2020   Chest heaviness 07/11/2020   Hyperlipidemia associated with type 2 diabetes mellitus ([place]) 07/11/2020   Primary hypertension 07/11/2020   Post-nasal drainage 08/03/2019   Class 3 severe obesity with serious comorbidity and body mass index (BMI) of 40.0 to 44.9 in adult ([place]) 06/10/2018   Type 2 diabetes mellitus with hyperglycemia ([place]) 06/10/2018   Subungual hematoma of fifth toe of right foot 10/29/2017   Dysphagia    Abnormal esophagram    Gastroesophageal reflux disease 05/24/2017   Pharyngitis 03/03/2017   Abscess of skin 04/15/2015   Bacterial vaginal infection 12/17/2014   Muscle spasm of back 11/19/2014   Cough 09/17/2013   Bronchitis with bronchospasm 04/14/2013   Hyperlipidemia LDL goal <100 11/06/2012   FATIGUE 06/01/2009   MORBID OBESITY 03/30/2008   DEPRESSION 09/05/2006   Hypothyroidism 05/23/2006   DIZZINESS, CHRONIC 05/23/2006   Obstructive sleep apnea 05/23/2006   Past Medical History:  Diagnosis Date   Abnormal esophagram    Abscess of skin 04/15/2015   Anxiety    Back pain    Bacterial vaginal infection 12/17/2014   Bronchitis with bronchospasm 04/14/2013   Chest heaviness 07/11/2020   Class 3 severe obesity with serious comorbidity and body mass index (BMI) of 40.0 to 44.9  in adult [facility]) 06/10/2018   Constipation    Cough 09/17/2013   DEPRESSION 09/05/2006   Qualifier: Diagnosis of  By: Jerold Coombe     Depression with anxiety 07/11/2020   Diabetes mellitus without complication ([place])    DIZZINESS, CHRONIC 05/23/2006   Qualifier: Diagnosis of  By: Jerold Coombe     Dysphagia    FATIGUE 06/01/2009   Qualifier: Diagnosis of  By: Jerold Coombe     Gallbladder problem    Gastroesophageal reflux disease 05/24/2017   Heart murmur    Hyperlipidemia    Hyperlipidemia associated with type 2 diabetes mellitus ([place]) 07/11/2020   Hyperlipidemia LDL goal <100 11/06/2012   Hypothyroidism 05/23/2006   Qualifier: Diagnosis of  By: Jerold Coombe     Lichen planus 75/11/2583   MORBID OBESITY 03/30/2008   Qualifier: Diagnosis of  By: Jerold Coombe     Muscle spasm of back 11/19/2014   Obstructive sleep apnea 05/23/2006   NPSG 08/03/94 RDI/AHI 49/hr, weight was 285 pounds CPAP/ Advanced    Pharyngitis 03/03/2017   Post-nasal drainage 08/03/2019   Last Assessment & Plan:  Formatting of this note might be different from the original. Concern over postnasal drainage. Chronic at least a year history of the above but over the last month seems to have worsened.  She has tried antihistamine therapy without improvement.  Denies obvious heartburn.  Some associated cough and occasional near emesis episodes.  She is not on an ACE inhibitor. EXAM show  Prediabetes    Preventative health care 01/10/2021   Primary hypertension 07/11/2020   Spider bite    Subungual hematoma of fifth toe of right foot 10/29/2017   Swelling    Type 2 diabetes mellitus with hyperglycemia ([place]) 06/10/2018   Past Surgical History:  Procedure Laterality Date   CHOLECYSTECTOMY     DILATION AND CURETTAGE OF UTERUS     ESOPHAGOGASTRODUODENOSCOPY (EGD) WITH PROPOFOL N/A 09/05/2017   Procedure: ESOPHAGOGASTRODUODENOSCOPY (EGD) WITH PROPOFOL;  Surgeon: Jerene Bears, MD;  Location: [facility];  Service:  Gastroenterology;  Laterality: N/A;   fibroids scrapping     TONSILLECTOMY     Social History   Tobacco Use   Smoking status: Former    Packs/day: 0.50    Years: 3.00    Total pack years: 1.50    Types: Cigarettes    Quit date: 08/10/1990    Years since quitting: 30.9   Smokeless tobacco: Never  Vaping Use   Vaping Use: Never used  Substance Use Topics   Alcohol use: Yes    Comment: rarely   Drug use: No   Social History   Socioeconomic History   Marital status: Divorced    Spouse name: Not on file   Number of children: 0   Years of education: Not on file   Highest education level: Not on file  Occupational History   Occupation: Eps Chartered loss adjuster: [employer]    Employer: [employer]   Occupation: RETIRED  Tobacco Use   Smoking status: Former    Packs/day: 0.50    Years: 3.00    Total pack years: 1.50    Types: Cigarettes    Quit date: 08/10/1990    Years since quitting: 30.9   Smokeless tobacco: Never  Vaping Use   Vaping Use: Never used  Substance and Sexual Activity   Alcohol use: Yes    Comment: rarely   Drug use: No   Sexual activity: Not Currently    Partners: Male  Other Topics Concern   Not on file  Social History Narrative   Regular exercise - yes-- 10,000 steps   Caffeine use: 2 cups of coffee daily   Social Determinants of Health   Financial Resource Strain: Low Risk  (05/18/2021)   Overall Financial Resource Strain (CARDIA)    Difficulty of Paying Living Expenses: Not hard at all  Food Insecurity: No Food Insecurity (05/18/2021)   Hunger Vital Sign    Worried About Running Out of Food in the Last Year: Never true    Ran Out of Food in the Last Year: Never true  Transportation Needs: No Transportation Needs (05/18/2021)   PRAPARE - Hydrologist (Medical): No    Lack of Transportation (Non-Medical): No  Physical Activity: Sufficiently Active (05/18/2021)   Exercise Vital Sign    Days of  Exercise per Week: 5 days    Minutes of Exercise per Session: 40 min  Stress: No Stress Concern Present (05/18/2021)   [place]    Feeling of Stress : Not at all  Social Connections: Moderately Integrated (05/18/2021)   Social Connection and Isolation Panel [NHANES]    Frequency of Communication with Friends and Family: More than three times a week    Frequency of Social Gatherings with Friends and Family: More than three times a week    Attends Religious Services: More than 4 times per year    Active  Member of Clubs or Organizations: Yes    Attends Music therapist: More than 4 times per year    Marital Status: Never married  Intimate Partner Violence: Not At Risk (05/18/2021)   Humiliation, Afraid, Rape, and Kick questionnaire    Fear of Current or Ex-Partner: No    Emotionally Abused: No    Physically Abused: No    Sexually Abused: No   Family Status  Relation Name Status   Mother  Alive   Father  Alive   Other  (Not Specified)   Other  (Not Specified)   Other  (Not Specified)   Mat Aunt  (Not Specified)   PGM  (Not Specified)   Mat Aunt  (Not Specified)   Mat Aunt  (Not Specified)   Neg Hx  (Not Specified)   Family History  Problem Relation Age of Onset   Diabetes Mother    Thyroid disease Mother    Depression Mother    Depression Other    Hyperlipidemia Other    Arthritis Other    Diabetes Maternal Aunt    Breast cancer Paternal Grandmother    Lung cancer Maternal Aunt        Nonsmoker   Cancer Maternal Aunt        breast   Breast cancer Maternal Aunt    Cancer Maternal Aunt        lung(nonsmoker)   Colon cancer Neg Hx    Pancreatic cancer Neg Hx    Stomach cancer Neg Hx    Esophageal cancer Neg Hx    Rectal cancer Neg Hx    No Known Allergies    ROS    Objective:     BP 124/78 (BP Location: Left Arm, Patient Position: Sitting, Cuff Size: Large)   Pulse 75   Temp 98.2  F (36.8 C) (Oral)   Resp 18   Ht '5\' 5"'$  (1.651 m)   Wt 261 lb 9.6 oz (118.7 kg)   SpO2 97%   BMI 43.53 kg/m  BP Readings from Last 3 Encounters:  07/28/21 124/78  01/10/21 120/80  12/01/20 126/67   Wt Readings from Last 3 Encounters:  07/28/21 261 lb 9.6 oz (118.7 kg)  05/18/21 255 lb (115.7 kg)  01/10/21 263 lb 3.2 oz (119.4 kg)   SpO2 Readings from Last 3 Encounters:  07/28/21 97%  01/10/21 97%  09/15/20 96%      Physical Exam Cardiovascular:     Heart sounds: Murmur heard.     Systolic murmur is present with a grade of 3/6.     Results for orders placed or performed in visit on 07/28/21  CBC with Differential/Platelet  Result Value Ref Range   WBC 5.7 4.0 - 10.5 K/uL   RBC 4.00 3.87 - 5.11 Mil/uL   Hemoglobin 12.0 12.0 - 15.0 g/dL   HCT 36.3 36.0 - 46.0 %   MCV 90.9 78.0 - 100.0 fl   MCHC 33.1 30.0 - 36.0 g/dL   RDW 14.1 11.5 - 15.5 %   Platelets 266.0 150.0 - 400.0 K/uL   Neutrophils Relative % 55.5 43.0 - 77.0 %   Lymphocytes Relative 28.8 12.0 - 46.0 %   Monocytes Relative 8.8 3.0 - 12.0 %   Eosinophils Relative 6.0 (H) 0.0 - 5.0 %   Basophils Relative 0.9 0.0 - 3.0 %   Neutro Abs 3.2 1.4 - 7.7 K/uL   Lymphs Abs 1.7 0.7 - 4.0 K/uL   Monocytes Absolute 0.5 0.1 - 1.0 K/uL  Eosinophils Absolute 0.3 0.0 - 0.7 K/uL   Basophils Absolute 0.1 0.0 - 0.1 K/uL  Comprehensive metabolic panel  Result Value Ref Range   Sodium 139 135 - 145 mEq/L   Potassium 5.1 3.5 - 5.1 mEq/L   Chloride 105 96 - 112 mEq/L   CO2 28 19 - 32 mEq/L   Glucose, Bld 174 (H) 70 - 99 mg/dL   BUN 19 6 - 23 mg/dL   Creatinine, Ser 0.72 0.40 - 1.20 mg/dL   Total Bilirubin 0.5 0.2 - 1.2 mg/dL   Alkaline Phosphatase 49 39 - 117 U/L   AST 27 0 - 37 U/L   ALT 20 0 - 35 U/L   Total Protein 6.3 6.0 - 8.3 g/dL   Albumin 4.1 3.5 - 5.2 g/dL   GFR 83.91 >60.00 mL/min   Calcium 9.4 8.4 - 10.5 mg/dL  Hemoglobin A1c  Result Value Ref Range   Hgb A1c MFr Bld 7.5 (H) 4.6 - 6.5 %  Lipid panel   Result Value Ref Range   Cholesterol 120 0 - 200 mg/dL   Triglycerides 119.0 0.0 - 149.0 mg/dL   HDL 31.10 (L) >39.00 mg/dL   VLDL 23.8 0.0 - 40.0 mg/dL   LDL Cholesterol 65 0 - 99 mg/dL   Total CHOL/HDL Ratio 4    NonHDL 88.74   TSH  Result Value Ref Range   TSH 0.83 0.35 - 5.50 uIU/mL    Last CBC Lab Results  Component Value Date   WBC 5.7 07/28/2021   HGB 12.0 07/28/2021   HCT 36.3 07/28/2021   MCV 90.9 07/28/2021   MCH 29.7 01/01/2018   RDW 14.1 07/28/2021   PLT 266.0 67/61/9509   Last metabolic panel Lab Results  Component Value Date   GLUCOSE 174 (H) 07/28/2021   NA 139 07/28/2021   K 5.1 07/28/2021   CL 105 07/28/2021   CO2 28 07/28/2021   BUN 19 07/28/2021   CREATININE 0.72 07/28/2021   GFRNONAA 68 10/06/2018   CALCIUM 9.4 07/28/2021   PROT 6.3 07/28/2021   ALBUMIN 4.1 07/28/2021   LABGLOB 2.4 10/06/2018   AGRATIO 1.8 10/06/2018   BILITOT 0.5 07/28/2021   ALKPHOS 49 07/28/2021   AST 27 07/28/2021   ALT 20 07/28/2021   Last lipids Lab Results  Component Value Date   CHOL 120 07/28/2021   HDL 31.10 (L) 07/28/2021   LDLCALC 65 07/28/2021   LDLDIRECT 193.3 11/06/2012   TRIG 119.0 07/28/2021   CHOLHDL 4 07/28/2021   Last hemoglobin A1c Lab Results  Component Value Date   HGBA1C 7.5 (H) 07/28/2021   Last thyroid functions Lab Results  Component Value Date   TSH 0.83 07/28/2021   T3TOTAL 106 01/01/2018   Last vitamin D Lab Results  Component Value Date   VD25OH 60.35 01/10/2021   Last vitamin B12 and Folate Lab Results  Component Value Date   VITAMINB12 724 10/06/2018   FOLATE >20.0 ng/mL 06/01/2009      The ASCVD Risk score (Arnett DK, et al., 2019) failed to calculate for the following reasons:   The valid total cholesterol range is 130 to 320 mg/dL    Assessment & Plan:   Problem List Items Addressed This Visit       Unprioritized   Type 2 diabetes mellitus with hyperglycemia ([place]) - Primary   Relevant Orders   CBC with  Differential/Platelet (Completed)   Comprehensive metabolic panel (Completed)   Hemoglobin A1c (Completed)   Primary hypertension  Relevant Orders   CBC with Differential/Platelet (Completed)   Hyperlipidemia associated with type 2 diabetes mellitus ([place])   Relevant Orders   CBC with Differential/Platelet (Completed)   Comprehensive metabolic panel (Completed)   Lipid panel (Completed)   Hypothyroidism    Check labs        Relevant Orders   CBC with Differential/Platelet (Completed)   TSH (Completed)    Return in about 6 months (around 01/27/2022), or if symptoms worsen or fail to improve, for fasting, annual exam.    [name], DO

## 2021-07-28 NOTE — Patient Instructions (Signed)

## 2021-07-28 NOTE — Assessment & Plan Note (Signed)
Check labs 

## 2021-07-31 ENCOUNTER — Emergency Department (HOSPITAL_BASED_OUTPATIENT_CLINIC_OR_DEPARTMENT_OTHER)
Admission: EM | Admit: 2021-07-31 | Discharge: 2021-07-31 | Disposition: A | Discharge status: Home / Self Care | Payer: Medicare Other | Attending: Emergency Medicine | Admitting: Emergency Medicine

## 2021-07-31 ENCOUNTER — Encounter (HOSPITAL_BASED_OUTPATIENT_CLINIC_OR_DEPARTMENT_OTHER): Payer: Self-pay | Admitting: Emergency Medicine

## 2021-07-31 ENCOUNTER — Other Ambulatory Visit: Payer: Self-pay

## 2021-07-31 DIAGNOSIS — Z79899 Other long term (current) drug therapy: Secondary | ICD-10-CM | POA: Diagnosis not present

## 2021-07-31 DIAGNOSIS — L089 Local infection of the skin and subcutaneous tissue, unspecified: Secondary | ICD-10-CM

## 2021-07-31 DIAGNOSIS — R21 Rash and other nonspecific skin eruption: Secondary | ICD-10-CM | POA: Diagnosis not present

## 2021-07-31 DIAGNOSIS — R238 Other skin changes: Secondary | ICD-10-CM | POA: Diagnosis not present

## 2021-07-31 DIAGNOSIS — S60420A Blister (nonthermal) of right index finger, initial encounter: Secondary | ICD-10-CM | POA: Insufficient documentation

## 2021-07-31 DIAGNOSIS — E119 Type 2 diabetes mellitus without complications: Secondary | ICD-10-CM | POA: Insufficient documentation

## 2021-07-31 DIAGNOSIS — S6991XA Unspecified injury of right wrist, hand and finger(s), initial encounter: Secondary | ICD-10-CM | POA: Diagnosis present

## 2021-07-31 DIAGNOSIS — I10 Essential (primary) hypertension: Secondary | ICD-10-CM | POA: Insufficient documentation

## 2021-07-31 DIAGNOSIS — E039 Hypothyroidism, unspecified: Secondary | ICD-10-CM | POA: Insufficient documentation

## 2021-07-31 DIAGNOSIS — X58XXXA Exposure to other specified factors, initial encounter: Secondary | ICD-10-CM | POA: Diagnosis not present

## 2021-07-31 DIAGNOSIS — Z794 Long term (current) use of insulin: Secondary | ICD-10-CM | POA: Diagnosis not present

## 2021-07-31 DIAGNOSIS — S60429A Blister (nonthermal) of unspecified finger, initial encounter: Secondary | ICD-10-CM | POA: Diagnosis not present

## 2021-07-31 MED ORDER — HYDROCORTISONE 1 % EX CREA: TOPICAL_CREAM | CUTANEOUS | 0 refills | Status: DC

## 2021-07-31 MED ORDER — BACITRACIN ZINC 500 UNIT/GM EX OINT
TOPICAL_OINTMENT | Freq: Once | CUTANEOUS | Status: AC | PRN
  Administered 2021-07-31: 1 via TOPICAL

## 2021-07-31 MED ORDER — CEPHALEXIN 500 MG PO CAPS: 500.0000 mg | ORAL_CAPSULE | Freq: Four times a day (QID) | ORAL | 0 refills | Status: AC

## 2021-07-31 NOTE — ED Provider Notes (Signed)
[facility] Provider Note   CSN: [id number] Arrival date & time: 07/31/21  7408     History  Chief Complaint  Patient presents with   Finger Injury   Rash    Amy Mahoney is a 71 y.o. female with a past medical history of type 2 diabetes, multiple skin abscesses, hypertension and hypothyroidism presenting today with 2 complaints.  First she complains of an infection to her finger.  Says that on Thursday she removed a hangnail from her right index finger and on Friday she woke up with a blood blister.  She lanced this and ever since then it has been draining purulent fluid.  She has tried Neosporin but she believes it still infected.  Also, for the past month she has been having difficulty with a rash under her left breast.  Says she has tried different lotions and bamboo ointment but this has not helped her.  Has not talked about these with her PCP.  No fevers, night sweats or chills.   Rash      Home Medications Prior to Admission medications   Medication Sig Start Date End Date Taking? Authorizing Provider  atorvastatin (LIPITOR) 40 MG tablet TAKE 1 TABLET BY MOUTH DAILY 06/26/21   Carollee Herter, Alferd Apa, DO  B-D UF III MINI PEN NEEDLES 31G X 5 MM MISC See admin instructions. 05/07/21   [provider]  Calcium Carbonate-Vitamin D (CALCIUM 500 + D PO) Take 1 tablet by mouth 2 (two) times daily.    [provider]  fenofibrate 160 MG tablet TAKE 1 TABLET BY MOUTH  DAILY 09/09/20   Carollee Herter, Alferd Apa, DO  insulin degludec (TRESIBA) 100 UNIT/ML FlexTouch Pen Inject 10 Units into the skin daily.    [provider]  levothyroxine (SYNTHROID) 112 MCG tablet TAKE 1 TABLET BY MOUTH DAILY  BEFORE BREAKFAST 07/04/21   Carollee Herter, Yvonne R, DO  lisinopril (ZESTRIL) 5 MG tablet TAKE 1 TABLET BY MOUTH  DAILY 05/03/21   [name], DO  Multiple Vitamin (MULTIVITAMIN) tablet Take 1 tablet by mouth daily.    [provider]  omeprazole (PRILOSEC) 40 MG capsule Take 40 mg by mouth daily. 05/06/21   [provider]  ONETOUCH ULTRA test strip USE AS DIRECTED UP TO 4 TIMES DAILY 10/04/20   Carollee Herter, Alferd Apa, DO  tirzepatide [facility]) 2.5 MG/0.5ML Pen INJECT 2.5 MG SUBCUTANEOUSLY WEEKLY 07/28/21   Roma Schanz R, DO  triamcinolone (KENALOG) 0.025 % ointment Apply 1 application topically 2 (two) times daily. 12/01/20   Donnamae Jude, MD  venlafaxine XR (EFFEXOR-XR) 150 MG 24 hr capsule TAKE 1 CAPSULE BY MOUTH  DAILY 02/22/21   Carollee Herter, Alferd Apa, DO  Vitamin D, Ergocalciferol, (DRISDOL) 1.25 MG (50000 UNIT) CAPS capsule TAKE 1 CAPSULE BY MOUTH  EVERY 7 DAYS 02/24/21   [name], DO      Allergies    Patient has no known allergies.    Review of Systems   Review of Systems  Skin:  Positive for rash.    Physical Exam Updated Vital Signs BP 133/69 (BP Location: Left Arm)   Pulse 80   Temp 98.6 F (37 C) (Oral)   Resp 20   Ht '5\' 5"'$  (1.651 m)   Wt 117.9 kg   SpO2 98%   BMI 43.27 kg/m  Physical Exam Vitals and nursing note reviewed.  Constitutional:      Appearance: Normal  appearance.  HENT:     Head: Normocephalic and atraumatic.  Eyes:     General: No scleral icterus.    Conjunctiva/sclera: Conjunctivae normal.  Pulmonary:     Effort: Pulmonary effort is normal. No respiratory distress.  Skin:    Findings: Erythema present. No rash.     Comments: Erythema and draining blister to the dorsal DIP of the right index finger.  Appears infected.  Skin breakdown under large bilateral breasts.  No rash and no exposed adipose tissue.  Neurological:     Mental Status: She is alert.  Psychiatric:        Mood and Affect: Mood normal.     ED Results / Procedures / Treatments   Labs (all labs ordered are listed, but only abnormal results are displayed) Labs Reviewed - No data to display  EKG None  Radiology No results found.  Procedures Procedures   Medications  Ordered in ED Medications  bacitracin ointment (has no administration in time range)    ED Course/ Medical Decision Making/ A&P                           Medical Decision Making Risk OTC drugs. Prescription drug management.   71 year old female presenting with infected finger as well as concern for rash under her breast.  Finger consistent with infection, likely secondary to manipulation of a hangnail.  Patient has a history of type 2 diabetes which is likely contributing to her poorly healing wound.  Similarly, skin breakdown under bilateral breasts is likely secondary to obesity and pressure in the skin folds.  She is currently wearing a bra with underwire we discussed that she should try her best to not wear bras at this time.  During her stay bacitracin was applied to her finger and wrapped with gauze.  She was prescribed Keflex for her finger which should cover any potentially developing bacterial infection under her breast however currently there is none.  I have sent hydrocortisone cream to the pharmacy and she understands she may also buy this over-the-counter for her skin breakdown.  These are 2 problems that should be followed by primary care as well as her diabetes.  She is agreeable to follow-up.  Vital signs stable, all concerns were addressed.  Comorbidities include her type 2 diabetes and obesity which make healing of these areas less successful.  I considered further lab work due to patient's wound to her finger however since her vital signs are stable I do not believe there is reasonable concern for sepsis at this time.  She was discharged and ambulated out of the department without difficulty.   Final Clinical Impression(s) / ED Diagnoses Final diagnoses:  Skin breakdown  Blister of finger with infection, initial encounter    Rx / DC Orders ED Discharge Orders          Ordered    cephALEXin (KEFLEX) 500 MG capsule  4 times daily        07/31/21 0941     hydrocortisone cream 1 %        07/31/21 0941           Results and diagnoses were explained to the patient. Return precautions discussed in full. Patient had no additional questions and expressed complete understanding.   This chart was dictated using voice recognition software.  Despite best efforts to proofread,  errors can occur which can change the documentation meaning.    Deniel Mcquiston, [place],  PA-C 07/31/21 1019    Dorie Rank, MD 08/01/21 [phone]

## 2021-07-31 NOTE — Discharge Instructions (Addendum)
Take the antibiotic as prescribed.  This is for your finger.  Be sure to keep it clean.  You may continue to use Neosporin twice a day.  Steroid cream is for the area under your breasts.  For the time being, do not wear bras, especially those with underwire.  If you must, cover the area of skin breakdown with gauze prior to putting on your bra.  Follow-up with your primary care provider about both of these if they are not getting better by the end of the week.

## 2021-07-31 NOTE — ED Triage Notes (Signed)
Right finger injury after taking out a hangnail 2 days ago.  Noted redness and swelling.  Pt also concerned about a rash under her left breast.

## 2021-08-07 ENCOUNTER — Other Ambulatory Visit: Payer: Self-pay | Admitting: Family Medicine

## 2021-08-07 DIAGNOSIS — E1165 Type 2 diabetes mellitus with hyperglycemia: Secondary | ICD-10-CM

## 2021-08-08 ENCOUNTER — Ambulatory Visit (INDEPENDENT_AMBULATORY_CARE_PROVIDER_SITE_OTHER): Payer: Medicare Other | Admitting: Family Medicine

## 2021-08-08 ENCOUNTER — Encounter: Payer: Self-pay | Admitting: Family Medicine

## 2021-08-08 VITALS — BP 133/77 | HR 77 | Temp 98.2°F | Resp 16 | Ht 65.0 in | Wt 261.2 lb

## 2021-08-08 DIAGNOSIS — L03019 Cellulitis of unspecified finger: Secondary | ICD-10-CM

## 2021-08-08 DIAGNOSIS — Z23 Encounter for immunization: Secondary | ICD-10-CM

## 2021-08-08 HISTORY — DX: Cellulitis of unspecified finger: L03.019

## 2021-08-08 MED ORDER — DOXYCYCLINE HYCLATE 100 MG PO TABS: 100.0000 mg | ORAL_TABLET | Freq: Two times a day (BID) | ORAL | 0 refills | Status: DC

## 2021-08-08 NOTE — Patient Instructions (Signed)
Paronychia Paronychia is an infection of the skin that surrounds a nail. It usually affects the skin around a fingernail, but it may also occur near a toenail. It often causes pain and swelling around the nail. In some cases, a collection of pus (abscess) can form near or under the nail.  This condition may develop suddenly, or it may develop gradually over a longer period. In most cases, paronychia is not serious, and it will clear up with treatment. What are the causes? This condition may be caused by bacteria or a fungus, such as yeast. The bacteria or fungus can enter the body through an opening in the skin, such as a cut or a hangnail, and cause an infection in your fingernail or toenail. Other causes may include: Recurrent injury to the fingernail or toenail area. Irritation of the base and sides of the nail (cuticle). Injury and irritation can result in inflammation, swelling, and thickened skin around the nail. What increases the risk? This condition is more likely to develop in people who: Get their hands wet often, such as those who work as dishwashers, bartenders, or housekeepers. Bite their fingernails or cuticles. Have underlying skin conditions. Have hangnails or injured fingertips. Are exposed to irritants like detergents and other chemicals. Have diabetes. What are the signs or symptoms? Symptoms of this condition include: Redness and swelling of the skin near the nail. Tenderness around the nail when you touch the area. Pus-filled bumps under the cuticle. Fluid or pus under the nail. Throbbing pain in the area. How is this diagnosed? This condition is diagnosed with a physical exam. In some cases, a sample of pus may be tested to determine what type of bacteria or fungus is causing the condition. How is this treated? Treatment depends on the cause and severity of your condition. If your condition is mild, it may clear up on its own in a few days or after soaking in warm  water. If needed, treatment may include: Antibiotic medicine, if your infection is caused by bacteria. Antifungal medicine, if your infection is caused by a fungus. A procedure to drain pus from an abscess. Anti-inflammatory medicine (corticosteroids). Removal of part of an ingrown toenail. A bandage (dressing) may be placed over the affected area if an abscess or part of a nail has been removed. Follow these instructions at home: Wound care Keep the affected area clean. Soak the affected area in warm water if told to do so by your health care provider. You may be told to do this for 20 minutes, 2-3 times a day. Keep the area dry when you are not soaking it. Do not try to drain an abscess yourself. Follow instructions from your health care provider about how to take care of the affected area. Make sure you: Wash your hands with soap and water for at least 20 seconds before and after you change your dressing. If soap and water are not available, use hand sanitizer. Change your dressing as told by your health care provider. If you had an abscess drained, check the area every day for signs of infection. Check for: Redness, swelling, or pain. Fluid or blood. Warmth. Pus or a bad smell. Medicines  Take over-the-counter and prescription medicines only as told by your health care provider. If you were prescribed an antibiotic medicine, take it as told by your health care provider. Do not stop taking the antibiotic even if you start to feel better. General instructions Avoid contact with any skin irritants or allergens.   Do not pick at the affected area. Keep all follow-up visits as told. This is important. Prevention To prevent this condition from happening again: Wear rubber gloves when washing dishes or doing other tasks that require your hands to get wet. Wear gloves if your hands might come in contact with cleaners or other chemicals. Avoid injuring your nails or fingertips. Do not bite  your nails or tear hangnails. Do not cut your nails very short. Do not cut your cuticles. Use clean nail clippers or scissors when trimming nails. Contact a health care provider if: Your symptoms get worse or do not improve with treatment. You have continued or increased fluid, blood, or pus coming from the affected area. Your affected finger, toe, or joint becomes swollen or difficult to move. You have a fever or chills. There is redness spreading away from the affected area. Summary Paronychia is an infection of the skin that surrounds a nail. It often causes pain and swelling around the nail. In some cases, a collection of pus (abscess) can form near or under the nail. This condition may be caused by bacteria or a fungus. These germs can enter the body through an opening in the skin, such as a cut or a hangnail. If your condition is mild, it may clear up on its own in a few days. If needed, treatment may include medicine or a procedure to drain pus from an abscess. To prevent this condition from happening again, wear gloves if doing tasks that require your hands to get wet or to come in contact with chemicals. Also avoid injuring your nails or fingertips. This information is not intended to replace advice given to you by your health care provider. Make sure you discuss any questions you have with your health care provider. Document Revised: 05/09/2020 Document Reviewed: 05/09/2020 Elsevier Patient Education  2023 [employer] Inc.  

## 2021-08-08 NOTE — Progress Notes (Signed)
New Patient Office Visit  Subjective    Patient ID: Amy Mahoney, female    DOB: May 20, 1950  Age: 71 y.o. MRN: [id number]  CC:  Chief Complaint  Patient presents with   FOLLOW UP FINGER INFECTION    HPI [name] presents to f/u from er for paronychia R index finger ---- it improved slightly but is still draining and she finished the keflex Er visit reviewed   Outpatient Encounter Medications as of 08/08/2021  Medication Sig   atorvastatin (LIPITOR) 40 MG tablet TAKE 1 TABLET BY MOUTH DAILY   B-D UF III MINI PEN NEEDLES 31G X 5 MM MISC See admin instructions.   Calcium Carbonate-Vitamin D (CALCIUM 500 + D PO) Take 1 tablet by mouth 2 (two) times daily.   doxycycline (VIBRA-TABS) 100 MG tablet Take 1 tablet (100 mg total) by mouth 2 (two) times daily.   fenofibrate 160 MG tablet TAKE 1 TABLET BY MOUTH  DAILY   hydrocortisone cream 1 % Apply to affected area 2 times daily   insulin degludec (TRESIBA) 100 UNIT/ML FlexTouch Pen Inject 10 Units into the skin daily.   levothyroxine (SYNTHROID) 112 MCG tablet TAKE 1 TABLET BY MOUTH DAILY  BEFORE BREAKFAST   lisinopril (ZESTRIL) 5 MG tablet TAKE 1 TABLET BY MOUTH DAILY   Multiple Vitamin (MULTIVITAMIN) tablet Take 1 tablet by mouth daily.   omeprazole (PRILOSEC) 40 MG capsule Take 40 mg by mouth daily.   ONETOUCH ULTRA test strip USE AS DIRECTED UP TO 4 TIMES DAILY   tirzepatide (MOUNJARO) 2.5 MG/0.5ML Pen INJECT 2.5 MG SUBCUTANEOUSLY WEEKLY   venlafaxine XR (EFFEXOR-XR) 150 MG 24 hr capsule TAKE 1 CAPSULE BY MOUTH  DAILY   Vitamin D, Ergocalciferol, (DRISDOL) 1.25 MG (50000 UNIT) CAPS capsule TAKE 1 CAPSULE BY MOUTH  EVERY 7 DAYS   [DISCONTINUED] triamcinolone (KENALOG) 0.025 % ointment Apply 1 application topically 2 (two) times daily.   No facility-administered encounter medications on file as of 08/08/2021.    Past Medical History:  Diagnosis Date   Abnormal esophagram    Abscess of skin 04/15/2015   Anxiety    Back pain     Bacterial vaginal infection 12/17/2014   Bronchitis with bronchospasm 04/14/2013   Chest heaviness 07/11/2020   Class 3 severe obesity with serious comorbidity and body mass index (BMI) of 40.0 to 44.9 in adult [facility]) 06/10/2018   Constipation    Cough 09/17/2013   DEPRESSION 09/05/2006   Qualifier: Diagnosis of  By: Jerold Coombe     Depression with anxiety 07/11/2020   Diabetes mellitus without complication ([place])    DIZZINESS, CHRONIC 05/23/2006   Qualifier: Diagnosis of  By: Jerold Coombe     Dysphagia    FATIGUE 06/01/2009   Qualifier: Diagnosis of  By: Jerold Coombe     Gallbladder problem    Gastroesophageal reflux disease 05/24/2017   Heart murmur    Hyperlipidemia    Hyperlipidemia associated with type 2 diabetes mellitus ([place]) 07/11/2020   Hyperlipidemia LDL goal <100 11/06/2012   Hypothyroidism 05/23/2006   Qualifier: Diagnosis of  By: Jerold Coombe     Lichen planus 96/29/5284   MORBID OBESITY 03/30/2008   Qualifier: Diagnosis of  By: Jerold Coombe     Muscle spasm of back 11/19/2014   Obstructive sleep apnea 05/23/2006   NPSG 08/03/94 RDI/AHI 49/hr, weight was 285 pounds CPAP/ Advanced    Pharyngitis 03/03/2017   Post-nasal drainage 08/03/2019   Last Assessment &  Plan:  Formatting of this note might be different from the original. Concern over postnasal drainage. Chronic at least a year history of the above but over the last month seems to have worsened.  She has tried antihistamine therapy without improvement.  Denies obvious heartburn.  Some associated cough and occasional near emesis episodes.  She is not on an ACE inhibitor. EXAM show   Prediabetes    Preventative health care 01/10/2021   Primary hypertension 07/11/2020   Spider bite    Subungual hematoma of fifth toe of right foot 10/29/2017   Swelling    Type 2 diabetes mellitus with hyperglycemia ([place]) 06/10/2018    Past Surgical History:  Procedure Laterality Date   CHOLECYSTECTOMY     DILATION AND CURETTAGE OF  UTERUS     ESOPHAGOGASTRODUODENOSCOPY (EGD) WITH PROPOFOL N/A 09/05/2017   Procedure: ESOPHAGOGASTRODUODENOSCOPY (EGD) WITH PROPOFOL;  Surgeon: Jerene Bears, MD;  Location: [facility];  Service: Gastroenterology;  Laterality: N/A;   fibroids scrapping     TONSILLECTOMY      Family History  Problem Relation Age of Onset   Diabetes Mother    Thyroid disease Mother    Depression Mother    Depression Other    Hyperlipidemia Other    Arthritis Other    Diabetes Maternal Aunt    Breast cancer Paternal Grandmother    Lung cancer Maternal Aunt        Nonsmoker   Cancer Maternal Aunt        breast   Breast cancer Maternal Aunt    Cancer Maternal Aunt        lung(nonsmoker)   Colon cancer Neg Hx    Pancreatic cancer Neg Hx    Stomach cancer Neg Hx    Esophageal cancer Neg Hx    Rectal cancer Neg Hx     Social History   Socioeconomic History   Marital status: Divorced    Spouse name: Not on file   Number of children: 0   Years of education: Not on file   Highest education level: Not on file  Occupational History   Occupation: Eps Chartered loss adjuster: [employer]    Employer: [employer]   Occupation: RETIRED  Tobacco Use   Smoking status: Former    Packs/day: 0.50    Years: 3.00    Total pack years: 1.50    Types: Cigarettes    Quit date: 08/10/1990    Years since quitting: 31.0   Smokeless tobacco: Never  Vaping Use   Vaping Use: Never used  Substance and Sexual Activity   Alcohol use: Yes    Comment: rarely   Drug use: No   Sexual activity: Not Currently    Partners: Male  Other Topics Concern   Not on file  Social History Narrative   Regular exercise - yes-- 10,000 steps   Caffeine use: 2 cups of coffee daily   Social Determinants of Health   Financial Resource Strain: Low Risk  (05/18/2021)   Overall Financial Resource Strain (CARDIA)    Difficulty of Paying Living Expenses: Not hard at all  Food Insecurity: No Food Insecurity  (05/18/2021)   Hunger Vital Sign    Worried About Running Out of Food in the Last Year: Never true    Ran Out of Food in the Last Year: Never true  Transportation Needs: No Transportation Needs (05/18/2021)   PRAPARE - Transportation    Lack of Transportation (Medical): No    Lack of  Transportation (Non-Medical): No  Physical Activity: Sufficiently Active (05/18/2021)   Exercise Vital Sign    Days of Exercise per Week: 5 days    Minutes of Exercise per Session: 40 min  Stress: No Stress Concern Present (05/18/2021)   [place]    Feeling of Stress : Not at all  Social Connections: Moderately Integrated (05/18/2021)   Social Connection and Isolation Panel [NHANES]    Frequency of Communication with Friends and Family: More than three times a week    Frequency of Social Gatherings with Friends and Family: More than three times a week    Attends Religious Services: More than 4 times per year    Active Member of [employer] or Organizations: Yes    Attends Archivist Meetings: More than 4 times per year    Marital Status: Never married  Intimate Partner Violence: Not At Risk (05/18/2021)   Humiliation, Afraid, Rape, and Kick questionnaire    Fear of Current or Ex-Partner: No    Emotionally Abused: No    Physically Abused: No    Sexually Abused: No    Review of Systems  Constitutional:  Negative for fever and malaise/fatigue.  HENT:  Negative for congestion.   Eyes:  Negative for blurred vision.  Respiratory:  Negative for shortness of breath.   Cardiovascular:  Negative for chest pain, palpitations and leg swelling.  Gastrointestinal:  Negative for abdominal pain, blood in stool and nausea.  Genitourinary:  Negative for dysuria and frequency.  Musculoskeletal:  Negative for falls.  Skin:  Negative for rash.  Neurological:  Negative for dizziness, loss of consciousness and headaches.  Endo/Heme/Allergies:  Negative  for environmental allergies.  Psychiatric/Behavioral:  Negative for depression. The patient is not nervous/anxious.         Objective    BP 133/77 (BP Location: Left Arm, Cuff Size: Large)   Pulse 77   Temp 98.2 F (36.8 C) (Oral)   Resp 16   Ht '5\' 5"'$  (1.651 m)   Wt 261 lb 3.2 oz (118.5 kg)   SpO2 97%   BMI 43.47 kg/m   Physical Exam Vitals and nursing note reviewed.  Constitutional:      Appearance: She is well-developed.  HENT:     Head: Normocephalic and atraumatic.  Eyes:     Conjunctiva/sclera: Conjunctivae normal.  Neck:     Thyroid: No thyromegaly.     Vascular: No carotid bruit or JVD.  Cardiovascular:     Rate and Rhythm: Normal rate and regular rhythm.     Heart sounds: Normal heart sounds. No murmur heard. Pulmonary:     Effort: Pulmonary effort is normal. No respiratory distress.     Breath sounds: Normal breath sounds. No wheezing or rales.  Chest:     Chest wall: No tenderness.  Musculoskeletal:     Cervical back: Normal range of motion and neck supple.  Skin:    Comments: R index finger --- + paronychia --- draining yellow  Culture done  Abx ointment and bandaid     Neurological:     Mental Status: She is alert and oriented to person, place, and time.         Assessment & Plan:   Problem List Items Addressed This Visit       Unprioritized   Paronychia of index finger - Primary    Doxycycline '100mg'$  bid x 10 days  abx ointment and bandaid  Td given  F/u prn  Relevant Medications   doxycycline (VIBRA-TABS) 100 MG tablet   Other Relevant Orders   Wound culture   Td vaccine greater than or equal to 7yo preservative free IM (Completed)    Return if symptoms worsen or fail to improve.   [name], DO

## 2021-08-08 NOTE — Assessment & Plan Note (Signed)
Doxycycline '100mg'$  bid x 10 days  abx ointment and bandaid  Td given  F/u prn

## 2021-08-09 ENCOUNTER — Ambulatory Visit: Payer: Medicare Other | Admitting: Internal Medicine

## 2021-08-09 NOTE — Progress Notes (Unsigned)
HPI female former smoker followed for OSA, Cough, complicated by obesity, hypothyroid, GERD NPSG 08/03/94 RDI/AHI 49/hr, weight was 285 pounds  --------------------------------------------------------------------------  08/09/20- 71 year old female former smoker followed for OSA, Cough, complicated by Obesity, hypothyroid, GERD, DM2, Hyperlipidemia,  CPAP auto 10-20/Adapt Download- compliance 100%, AHI 4.5/ hr Body weight today-265 lbs Covid vax-none -----No problems.  Having echo on Thursday. Sleeps very well with CPAP. Download reviewed with her. Pressures good Pending echo- has chronic murmur. Now insulin dependent. CXR 07/11/20 IMPRESSION: Chest negative for acute cardiopulmonary disease  08/11/21- 71 year old female former smoker followed for OSA, Cough, complicated by Obesity, hypothyroid, GERD, DM2, Hyperlipidemia,  CPAP auto 10-20/Adapt Download- compliance  Body weight today- Covid vax-none   ROS-see HPI   + = positive Constitutional:   No-   weight loss, night sweats, fevers, chills, fatigue, lassitude. HEENT:   No-  headaches, difficulty swallowing, tooth/dental problems, sore throat,       No-  sneezing, itching, ear ache, +nasal congestion, post nasal drip,  CV:  No-   chest pain, orthopnea, PND, swelling in lower extremities, anasarca,  dizziness, palpitations Resp: No-   shortness of breath with exertion or at rest.               productive cough,   non-productive cough,  No- coughing up of blood.              No-   change in color of mucus.  No- wheezing.   Skin: No-   rash or lesions. GI:  +heartburn, indigestion, abdominal pain, nausea, vomiting, GU: . MS:  No-   joint pain or swelling.   Neuro-     nothing unusual Psych:  No- change in mood or affect. No depression or anxiety.  No memory loss.  OBJ- Physical Exam General- Alert, Oriented, Affect-appropriate, Distress- none acute, + morbidly obese Skin- rash-none, lesions- none, excoriation-  none Lymphadenopathy- none Head- atraumatic            Eyes- Gross vision intact, PERRLA, conjunctivae and secretions clear            Ears- Hearing, canals-normal            Nose- Clear, no-Septal dev, mucus, polyps, erosion, perforation             Throat- Mallampati III-IV , mucosa clear , drainage- none, tonsils- atrophic Neck- flexible , trachea midline, no stridor , thyroid nl, carotid no bruit Chest - symmetrical excursion , unlabored           Heart/CV- RRR , +7/0 systolic M, no gallop  , no rub, nl s1 s2                           - JVD- none , edema- none, stasis changes- none, varices- none           Lung- clear, wheeze- none, cough- none , dullness-none, rub- none           Chest wall-  Abd-  Br/ Gen/ Rectal- Not done, not indicated Extrem- cyanosis- none, clubbing, none, atrophy- none, strength- nl Neuro- grossly intact to observation   .

## 2021-08-10 ENCOUNTER — Encounter: Payer: Self-pay | Admitting: Internal Medicine

## 2021-08-11 ENCOUNTER — Ambulatory Visit: Payer: Medicare Other | Admitting: Internal Medicine

## 2021-08-11 ENCOUNTER — Encounter: Payer: Self-pay | Admitting: Internal Medicine

## 2021-08-11 DIAGNOSIS — G4733 Obstructive sleep apnea (adult) (pediatric): Secondary | ICD-10-CM

## 2021-08-11 DIAGNOSIS — Z6841 Body Mass Index (BMI) 40.0 and over, adult: Secondary | ICD-10-CM

## 2021-08-11 LAB — WOUND CULTURE
MICRO NUMBER:: 13548192
SPECIMEN QUALITY:: ADEQUATE

## 2021-08-11 NOTE — Assessment & Plan Note (Signed)
Benefiting from CPAP with good compliance and control.  She is quite happy with that.  No necessary changes identified. Plan-continue auto 10-20

## 2021-08-12 ENCOUNTER — Other Ambulatory Visit: Payer: Self-pay | Admitting: Family Medicine

## 2021-08-12 ENCOUNTER — Encounter (HOSPITAL_BASED_OUTPATIENT_CLINIC_OR_DEPARTMENT_OTHER): Payer: Self-pay | Admitting: Emergency Medicine

## 2021-08-12 ENCOUNTER — Emergency Department (HOSPITAL_BASED_OUTPATIENT_CLINIC_OR_DEPARTMENT_OTHER)
Admission: EM | Admit: 2021-08-12 | Discharge: 2021-08-12 | Disposition: A | Discharge status: Home / Self Care | Payer: Medicare Other | Attending: Emergency Medicine | Admitting: Emergency Medicine

## 2021-08-12 ENCOUNTER — Other Ambulatory Visit: Payer: Self-pay

## 2021-08-12 ENCOUNTER — Emergency Department (HOSPITAL_BASED_OUTPATIENT_CLINIC_OR_DEPARTMENT_OTHER): Payer: Medicare Other

## 2021-08-12 DIAGNOSIS — E785 Hyperlipidemia, unspecified: Secondary | ICD-10-CM

## 2021-08-12 DIAGNOSIS — R42 Dizziness and giddiness: Secondary | ICD-10-CM | POA: Diagnosis not present

## 2021-08-12 DIAGNOSIS — E119 Type 2 diabetes mellitus without complications: Secondary | ICD-10-CM | POA: Insufficient documentation

## 2021-08-12 DIAGNOSIS — I10 Essential (primary) hypertension: Secondary | ICD-10-CM | POA: Diagnosis not present

## 2021-08-12 DIAGNOSIS — Z794 Long term (current) use of insulin: Secondary | ICD-10-CM | POA: Diagnosis not present

## 2021-08-12 DIAGNOSIS — Z79899 Other long term (current) drug therapy: Secondary | ICD-10-CM | POA: Diagnosis not present

## 2021-08-12 DIAGNOSIS — E039 Hypothyroidism, unspecified: Secondary | ICD-10-CM | POA: Insufficient documentation

## 2021-08-12 DIAGNOSIS — F321 Major depressive disorder, single episode, moderate: Secondary | ICD-10-CM

## 2021-08-12 LAB — CBC
HCT: 38.4 % (ref 36.0–46.0)
Hemoglobin: 12.5 g/dL (ref 12.0–15.0)
MCH: 30 pg (ref 26.0–34.0)
MCHC: 32.6 g/dL (ref 30.0–36.0)
MCV: 92.3 fL (ref 80.0–100.0)
Platelets: 341 10*3/uL (ref 150–400)
RBC: 4.16 MIL/uL (ref 3.87–5.11)
RDW: 13.3 % (ref 11.5–15.5)
WBC: 6.6 10*3/uL (ref 4.0–10.5)
nRBC: 0 % (ref 0.0–0.2)

## 2021-08-12 LAB — TROPONIN I (HIGH SENSITIVITY): Troponin I (High Sensitivity): 4 ng/L (ref <18)

## 2021-08-12 LAB — URINALYSIS, ROUTINE W REFLEX MICROSCOPIC
Bilirubin Urine: NEGATIVE
Glucose, UA: NEGATIVE mg/dL
Hgb urine dipstick: NEGATIVE
Ketones, ur: NEGATIVE mg/dL
Leukocytes,Ua: NEGATIVE
Nitrite: NEGATIVE
Protein, ur: NEGATIVE mg/dL
Specific Gravity, Urine: 1.015 (ref 1.005–1.030)
pH: 7 (ref 5.0–8.0)

## 2021-08-12 LAB — BASIC METABOLIC PANEL
Anion gap: 3 — ABNORMAL LOW (ref 5–15)
BUN: 22 mg/dL (ref 8–23)
CO2: 26 mmol/L (ref 22–32)
Calcium: 9.3 mg/dL (ref 8.9–10.3)
Chloride: 107 mmol/L (ref 98–111)
Creatinine, Ser: 0.83 mg/dL (ref 0.44–1.00)
GFR, Estimated: >60 mL/min (ref >60)
Glucose, Bld: 170 mg/dL — ABNORMAL HIGH (ref 70–99)
Potassium: 4.4 mmol/L (ref 3.5–5.1)
Sodium: 136 mmol/L (ref 135–145)

## 2021-08-12 LAB — MAGNESIUM: Magnesium: 1.6 mg/dL — ABNORMAL LOW (ref 1.7–2.4)

## 2021-08-12 LAB — CBG MONITORING, ED: Glucose-Capillary: 140 mg/dL — ABNORMAL HIGH (ref 70–99)

## 2021-08-12 MED ORDER — MECLIZINE HCL 25 MG PO TABS
25.0000 mg | ORAL_TABLET | Freq: Once | ORAL | Status: AC
  Administered 2021-08-12: 25 mg via ORAL
  Filled 2021-08-12: qty 1

## 2021-08-12 MED ORDER — MECLIZINE HCL 12.5 MG PO TABS: 12.5000 mg | ORAL_TABLET | Freq: Three times a day (TID) | ORAL | 0 refills | Status: DC | PRN

## 2021-08-12 MED ORDER — MAGNESIUM OXIDE -MG SUPPLEMENT 400 (240 MG) MG PO TABS
200.0000 mg | ORAL_TABLET | Freq: Once | ORAL | Status: AC
  Administered 2021-08-12: 200 mg via ORAL

## 2021-08-12 MED ORDER — MAGNESIUM OXIDE -MG SUPPLEMENT 400 (240 MG) MG PO TABS: 200.0000 mg | ORAL_TABLET | Freq: Once | ORAL | Status: DC

## 2021-08-12 NOTE — ED Triage Notes (Signed)
Pt arrives pov with family, slow gait, stand by assist to room, c/o dizziness upon waking at 0630. Last normal 2130 last night. AOx4, speech clear, denies HA, Van neg.

## 2021-08-12 NOTE — ED Provider Notes (Signed)
[facility] EMERGENCY DEPARTMENT Provider Note   CSN: [id number] Arrival date & time: 08/12/21  1039     History  Chief Complaint  Patient presents with   Dizziness    Amy Mahoney is a 71 y.o. female.  Patient is a 71 year old female with past medical history of diabetes type 2, hypertension, hyperlipidemia, obesity, and hypothyroidism presenting for complaints of dizziness.  Patient describes dizziness as lightheadedness, worse with walking, that she first noticed upon waking at 0630 this morning.  Denies any slurred speech, difficulty speaking, facial asymmetry, sensation changes, or motor changes.  Denies any active bleeding including no melena, hematochezia, or hematuria.  Denies any recent illness including no fevers, chills, nausea, vomiting, diarrhea.  Denies any URI symptoms including no dysuria or increased frequency.  Denies any chest pain, shortness of breath, leg swelling, or history of DVT or PE.  The history is provided by the patient. No language interpreter was used.  Dizziness Associated symptoms: no chest pain, no palpitations, no shortness of breath and no vomiting        Home Medications Prior to Admission medications   Medication Sig Start Date End Date Taking? Authorizing Provider  meclizine (ANTIVERT) 12.5 MG tablet Take 1 tablet (12.5 mg total) by mouth 3 (three) times daily as needed for dizziness. 03/24/53  Yes Campbell Stall P, DO  atorvastatin (LIPITOR) 40 MG tablet TAKE 1 TABLET BY MOUTH DAILY 06/26/21   Carollee Herter, Alferd Apa, DO  B-D UF III MINI PEN NEEDLES 31G X 5 MM MISC See admin instructions. 05/07/21   [provider]  Calcium Carbonate-Vitamin D (CALCIUM 500 + D PO) Take 1 tablet by mouth 2 (two) times daily.    [provider]  doxycycline (VIBRA-TABS) 100 MG tablet Take 1 tablet (100 mg total) by mouth 2 (two) times daily. 08/08/21   Roma Schanz R, DO  fenofibrate 160 MG tablet TAKE 1 TABLET BY MOUTH  DAILY 08/14/21    Carollee Herter, Alferd Apa, DO  hydrocortisone cream 1 % Apply to affected area 2 times daily 07/31/21   Redwine, Madison A, PA-C  insulin degludec (TRESIBA) 100 UNIT/ML FlexTouch Pen Inject 10 Units into the skin daily.    [provider]  levothyroxine (SYNTHROID) 112 MCG tablet TAKE 1 TABLET BY MOUTH DAILY  BEFORE BREAKFAST 07/04/21   Carollee Herter, Yvonne R, DO  lisinopril (ZESTRIL) 5 MG tablet TAKE 1 TABLET BY MOUTH DAILY 08/07/21   [name], DO  Multiple Vitamin (MULTIVITAMIN) tablet Take 1 tablet by mouth daily.    [provider]  omeprazole (PRILOSEC) 40 MG capsule Take 40 mg by mouth daily. 05/06/21   [provider]  ONETOUCH ULTRA test strip USE AS DIRECTED UP TO 4 TIMES DAILY 10/04/20   Carollee Herter, Alferd Apa, DO  tirzepatide [facility]) 2.5 MG/0.5ML Pen INJECT 2.5 MG SUBCUTANEOUSLY WEEKLY 07/28/21   Carollee Herter, Kendrick Fries R, DO  venlafaxine XR (EFFEXOR-XR) 150 MG 24 hr capsule TAKE 1 CAPSULE BY MOUTH DAILY 08/14/21   Carollee Herter, Alferd Apa, DO  Vitamin D, Ergocalciferol, (DRISDOL) 1.25 MG (50000 UNIT) CAPS capsule TAKE 1 CAPSULE BY MOUTH  EVERY 7 DAYS 02/24/21   [name], DO      Allergies    Patient has no known allergies.    Review of Systems   Review of Systems  Constitutional:  Negative for chills and fever.  HENT:  Negative for ear pain and sore throat.   Eyes:  Negative for pain and visual disturbance.  Respiratory:  Negative for cough and shortness of breath.   Cardiovascular:  Negative for chest pain and palpitations.  Gastrointestinal:  Negative for abdominal pain and vomiting.  Genitourinary:  Negative for dysuria and hematuria.  Musculoskeletal:  Negative for arthralgias and back pain.  Skin:  Negative for color change and rash.  Neurological:  Positive for dizziness. Negative for seizures and syncope.  All other systems reviewed and are negative.   Physical Exam Updated Vital Signs BP 130/68   Pulse 61   Temp 98 F (36.7 C)    Resp 15   Ht 5' 5.5" (1.664 m)   Wt 123.6 kg   SpO2 97%   BMI 44.64 kg/m  Physical Exam Vitals and nursing note reviewed.  Constitutional:      General: She is not in acute distress.    Appearance: She is well-developed.  HENT:     Head: Normocephalic and atraumatic.  Eyes:     General: Lids are normal. Vision grossly intact.     Conjunctiva/sclera: Conjunctivae normal.     Pupils: Pupils are equal, round, and reactive to light.  Cardiovascular:     Rate and Rhythm: Normal rate and regular rhythm.     Heart sounds: No murmur heard. Pulmonary:     Effort: Pulmonary effort is normal. No respiratory distress.     Breath sounds: Normal breath sounds.  Abdominal:     Palpations: Abdomen is soft.     Tenderness: There is no abdominal tenderness.  Musculoskeletal:        General: No swelling.     Cervical back: Neck supple.  Skin:    General: Skin is warm and dry.     Capillary Refill: Capillary refill takes less than 2 seconds.  Neurological:     General: No focal deficit present.     Mental Status: She is alert and oriented to person, place, and time.     GCS: GCS eye subscore is 4. GCS verbal subscore is 5. GCS motor subscore is 6.     Cranial Nerves: Cranial nerves 2-12 are intact.     Sensory: Sensation is intact.     Motor: Motor function is intact.     Coordination: Coordination is intact.  Psychiatric:        Mood and Affect: Mood normal.     ED Results / Procedures / Treatments   Labs (all labs ordered are listed, but only abnormal results are displayed) Labs Reviewed  BASIC METABOLIC PANEL - Abnormal; Notable for the following components:      Result Value   Glucose, Bld 170 (*)    Anion gap 3 (*)    All other components within normal limits  MAGNESIUM - Abnormal; Notable for the following components:   Magnesium 1.6 (*)    All other components within normal limits  CBG MONITORING, ED - Abnormal; Notable for the following components:   Glucose-Capillary  140 (*)    All other components within normal limits  CBC  URINALYSIS, ROUTINE W REFLEX MICROSCOPIC  TROPONIN I (HIGH SENSITIVITY)    EKG EKG Interpretation  Date/Time:  Saturday August 12 2021 10:52:27 EDT Ventricular Rate:  67 PR Interval:  141 QRS Duration: 89 QT Interval:  384 QTC Calculation: 406 R Axis:   33 Text Interpretation: Sinus rhythm Confirmed by Campbell Stall ([id number]) on 08/31/4578 12:08:49 PM  Radiology No results found.  Procedures Procedures    Medications Ordered in ED Medications  magnesium oxide (MAG-OX) tablet 200 mg (200 mg Oral Given 08/12/21 1220)  meclizine (ANTIVERT) tablet 25 mg (25 mg Oral Given 08/12/21 1246)    ED Course/ Medical Decision Making/ A&P                           Medical Decision Making Amount and/or Complexity of Data Reviewed Labs: ordered. Radiology: ordered.  Risk OTC drugs.  74:9 AM  71 year old female with past medical history of diabetes type 2, hypertension, hyperlipidemia, obesity, and hypothyroidism presenting for complaints of dizziness described as lightheadedness.  Patient is alert and oriented x3, no acute distress, afebrile, stable to signs.    EKG is interpreted by myself on 08/12/2021 demonstrates normal sinus rhythm with a rate of 67 bpm.  No ST segment elevation or depression.  Stable intervals.  Normal axis.  Stable electrolytes, troponins, and chest x-ray.  I do not believe dizziness to be secondary to cardiovascular etiology at this time.  Patient also has stable electrolytes.  No anemia.  No hypoxia.  No hypoglycemia.  No orthostatic hypotension.  No nystagmus, changes with head movements, or concern for benign positional vertigo.  No neurovascular deficits on exam or concerning signs for stroke.  No clinical or laboratory signs of dehydration.  This time I believe there is no cute life-threatening etiologies of dizziness.  Patient recommended for Antivert and follow-up with her primary care physician in the  next 3 to 5 days if symptoms do not resolve.  Patient in no distress and overall condition improved here in the ED. Detailed discussions were had with the patient regarding current findings, and need for close f/u with PCP or on call doctor. The patient has been instructed to return immediately if the symptoms worsen in any way for re-evaluation. Patient verbalized understanding and is in agreement with current care plan. All questions answered prior to discharge.          Final Clinical Impression(s) / ED Diagnoses Final diagnoses:  Dizziness    Rx / DC Orders ED Discharge Orders          Ordered    meclizine (ANTIVERT) 12.5 MG tablet  3 times daily PRN        08/12/21 1317              Lianne Cure, DO 21/30/86 [phone]

## 2021-08-14 ENCOUNTER — Telehealth: Payer: Self-pay

## 2021-08-14 NOTE — Telephone Encounter (Signed)
Patient evaluated/treated in ER.   [facility] Night - Client TELEPHONE ADVICE RECORD [employer] Patient Name: Amy Mahoney Gender: Female DOB: March 04, 1950 Chief Complaint Dizziness Reason for Call Symptomatic / Request for Health Information Initial Comment Caller was given Doxycycline for an infected finger and has taken 3-4 doses. States she is now feeling very dizzy. Translation No Nurse Assessment Nurse: Noel Journey, RN, [place] Date/Time (Eastern Time): 08/12/2021 10:02:40 AM Confirm and document reason for call. If symptomatic, describe symptoms. ---Caller was given Doxycycline on Wednesday for an infected finger and has taken 6 doses. States she is now feeling very dizzy. Blood sugar is 180. Does the patient have any new or worsening symptoms? ---Yes Will a triage be completed? ---Yes Related visit to physician within the last 2 weeks? ---Yes Does the PT have any chronic conditions? (i.e. diabetes, asthma, this includes High risk factors for pregnancy, etc.) ---Yes List chronic conditions. ---diabetes type 2, hypothyroid, OSA Is this a behavioral health or substance abuse call? ---No Guidelines Guideline Title Affirmed Question Affirmed Notes Nurse Date/Time (Eastern Time) Dizziness - Vertigo SEVERE dizziness (vertigo) (e.g., unable to walk without assistance) Overcast, RN, [facility] 08/12/2021 10:07:36 AM Disp. Time Lamount Cohen Time) Disposition Final User 08/12/2021 10:09:17 AM Go to ED Now (or PCP triage) Yes Overcast, RN, [place] PLEASE NOTE: All timestamps contained within this report are represented as [place] Standard Time. CONFIDENTIALTY NOTICE: This fax transmission is intended only for the addressee. It contains information that is legally privileged, confidential or otherwise protected from use or disclosure. If you are not the intended recipient, you are strictly prohibited from reviewing, disclosing, copying using or disseminating any of this  information or taking any action in reliance on or regarding this information. If you have received this fax in error, please notify us immediately by telephone so that we can arrange for its return to [name]. Phone: [phone], Toll-Free: [phone], Fax: [phone] Page: 2 of 2 Call Id: [id number] Caller Disagree/Comply Comply Caller Understands Yes PreDisposition Call Doctor Care Advice Given Per Guideline GO TO ED NOW (OR PCP TRIAGE): * IF NO PCP (PRIMARY CARE PROVIDER) SECOND-LEVEL TRIAGE: You need to be seen within the next hour. Go to the ED/UCC at _____________ Hospital. Leave as soon as you can. NOTE TO TRIAGER - DRIVING: * Another adult should drive. * Patient should not delay going to the emergency department. CARE ADVICE given per Dizziness - Vertigo (Adult) guideline. Referrals [facility] - ED

## 2021-09-04 ENCOUNTER — Telehealth: Payer: Self-pay | Admitting: Family Medicine

## 2021-09-04 NOTE — Telephone Encounter (Signed)
Patient states her [place] medication was supposed to be raised from 2.5 to 5, Please advise.

## 2021-09-04 NOTE — Telephone Encounter (Signed)
Please advise 

## 2021-09-05 MED ORDER — TIRZEPATIDE 5 MG/0.5ML ~~LOC~~ SOAJ: 5.0000 mg | SUBCUTANEOUS | 2 refills | Status: DC

## 2021-09-05 NOTE — Telephone Encounter (Signed)
Patient notified and rx sent in 

## 2021-09-07 DIAGNOSIS — G4733 Obstructive sleep apnea (adult) (pediatric): Secondary | ICD-10-CM | POA: Diagnosis not present

## 2021-09-10 ENCOUNTER — Other Ambulatory Visit: Payer: Self-pay | Admitting: Family Medicine

## 2021-09-10 DIAGNOSIS — E1165 Type 2 diabetes mellitus with hyperglycemia: Secondary | ICD-10-CM

## 2021-09-11 ENCOUNTER — Encounter: Payer: Self-pay | Admitting: Family Medicine

## 2021-09-11 ENCOUNTER — Other Ambulatory Visit: Payer: Self-pay | Admitting: Family Medicine

## 2021-09-11 ENCOUNTER — Ambulatory Visit (INDEPENDENT_AMBULATORY_CARE_PROVIDER_SITE_OTHER): Payer: Medicare Other | Admitting: Family Medicine

## 2021-09-11 VITALS — BP 126/70 | HR 76 | Temp 98.6°F | Resp 18 | Ht 65.5 in | Wt 265.6 lb

## 2021-09-11 DIAGNOSIS — R42 Dizziness and giddiness: Secondary | ICD-10-CM | POA: Diagnosis not present

## 2021-09-11 DIAGNOSIS — E785 Hyperlipidemia, unspecified: Secondary | ICD-10-CM

## 2021-09-11 DIAGNOSIS — F419 Anxiety disorder, unspecified: Secondary | ICD-10-CM | POA: Diagnosis not present

## 2021-09-11 DIAGNOSIS — R011 Cardiac murmur, unspecified: Secondary | ICD-10-CM

## 2021-09-11 MED ORDER — CLONAZEPAM 0.5 MG PO TABS: 0.5000 mg | ORAL_TABLET | Freq: Two times a day (BID) | ORAL | 1 refills | Status: DC | PRN

## 2021-09-11 NOTE — Progress Notes (Signed)
Subjective:   By signing my name below, I, Carylon Perches, attest that this documentation has been prepared under the direction and in the presence of [name] DO 09/11/2021   Patient ID: Amy Mahoney, female    DOB: 12-10-1950, 71 y.o.   MRN: [id number]  Chief Complaint  Patient presents with   ED visit follow up    Vertigo, Pt states vertigo comes and goes and pt states she has been experiencing some anxiety. Pt does take Effexor but it doesn't help.     HPI Patient is in today for an office visit. She is accompanied by her brother.   She reports that her index finger is improving.   She is currently taking Magnesium supplements.   She reports that she was admitted to the ED on 08/12/2021 for Vertigo. She was discharged on the same day. She was prescribed 12.5 Mg of Meclizine HCl. She states that the medication makes her tired but when she wakes up, she feels better. However, symptoms would reappear once the medication would wear off.   She is currently taking care of her father. Her brother is planning for her, the father and other family members to stay in a single home. He states that her anxiety has gotten to the point where it is now affecting her health. She has a caregiver coming in to help her take care of her father. Her brother is requesting for the patient to receive medication or counseling or both for her anxiety. The patient believes that the anxiety might be triggering her vertigo. She is currently taking 150 Mg of Effexor-XR but states that her anxiety is worsening. She reports that in the past two weeks, she would randomly cry which she believes could be due to stress.  She reports that her blood sugars are running between 170 - 180 mg/dL. She states that she has not been taking her 5 Mg/0.5 mL of Mounjaro due to the medication being on backordered. She denies of any vision or urinary symptoms.  Lab Results  Component Value Date   HGBA1C 7.5 (H) 07/28/2021    She denies of worsening auditory symptoms  Past Medical History:  Diagnosis Date   Abnormal esophagram    Abscess of skin 04/15/2015   Anxiety    Back pain    Bacterial vaginal infection 12/17/2014   Bronchitis with bronchospasm 04/14/2013   Chest heaviness 07/11/2020   Class 3 severe obesity with serious comorbidity and body mass index (BMI) of 40.0 to 44.9 in adult [facility] 06/10/2018   Constipation    Cough 09/17/2013   DEPRESSION 09/05/2006   Qualifier: Diagnosis of  By: Jerold Coombe     Depression with anxiety 07/11/2020   Diabetes mellitus without complication ([place])    DIZZINESS, CHRONIC 05/23/2006   Qualifier: Diagnosis of  By: Jerold Coombe     Dysphagia    FATIGUE 06/01/2009   Qualifier: Diagnosis of  By: Jerold Coombe     Gallbladder problem    Gastroesophageal reflux disease 05/24/2017   Heart murmur    Hyperlipidemia    Hyperlipidemia associated with type 2 diabetes mellitus ([place]) 07/11/2020   Hyperlipidemia LDL goal <100 11/06/2012   Hypothyroidism 05/23/2006   Qualifier: Diagnosis of  By: Jerold Coombe     Lichen planus 41/66/0630   MORBID OBESITY 03/30/2008   Qualifier: Diagnosis of  By: Jerold Coombe     Muscle spasm of back 11/19/2014   Obstructive sleep  apnea 05/23/2006   NPSG 08/03/94 RDI/AHI 49/hr, weight was 285 pounds CPAP/ Advanced    Pharyngitis 03/03/2017   Post-nasal drainage 08/03/2019   Last Assessment & Plan:  Formatting of this note might be different from the original. Concern over postnasal drainage. Chronic at least a year history of the above but over the last month seems to have worsened.  She has tried antihistamine therapy without improvement.  Denies obvious heartburn.  Some associated cough and occasional near emesis episodes.  She is not on an ACE inhibitor. EXAM show   Prediabetes    Preventative health care 01/10/2021   Primary hypertension 07/11/2020   Spider bite    Subungual hematoma of fifth toe of right foot 10/29/2017   Swelling     Type 2 diabetes mellitus with hyperglycemia ([place]) 06/10/2018    Past Surgical History:  Procedure Laterality Date   CHOLECYSTECTOMY     DILATION AND CURETTAGE OF UTERUS     ESOPHAGOGASTRODUODENOSCOPY (EGD) WITH PROPOFOL N/A 09/05/2017   Procedure: ESOPHAGOGASTRODUODENOSCOPY (EGD) WITH PROPOFOL;  Surgeon: Jerene Bears, MD;  Location: [facility];  Service: Gastroenterology;  Laterality: N/A;   fibroids scrapping     TONSILLECTOMY      Family History  Problem Relation Age of Onset   Diabetes Mother    Thyroid disease Mother    Depression Mother    Depression Other    Hyperlipidemia Other    Arthritis Other    Diabetes Maternal Aunt    Breast cancer Paternal Grandmother    Lung cancer Maternal Aunt        Nonsmoker   Cancer Maternal Aunt        breast   Breast cancer Maternal Aunt    Cancer Maternal Aunt        lung(nonsmoker)   Colon cancer Neg Hx    Pancreatic cancer Neg Hx    Stomach cancer Neg Hx    Esophageal cancer Neg Hx    Rectal cancer Neg Hx     Social History   Socioeconomic History   Marital status: Divorced    Spouse name: Not on file   Number of children: 0   Years of education: Not on file   Highest education level: Not on file  Occupational History   Occupation: Eps Chartered loss adjuster: [employer]    Employer: [employer]   Occupation: RETIRED  Tobacco Use   Smoking status: Former    Packs/day: 0.50    Years: 3.00    Total pack years: 1.50    Types: Cigarettes    Quit date: 08/10/1990    Years since quitting: 31.1   Smokeless tobacco: Never  Vaping Use   Vaping Use: Never used  Substance and Sexual Activity   Alcohol use: Yes    Comment: rarely   Drug use: No   Sexual activity: Not Currently    Partners: Male  Other Topics Concern   Not on file  Social History Narrative   Regular exercise - yes-- 10,000 steps   Caffeine use: 2 cups of coffee daily   Social Determinants of Health   Financial Resource Strain: Low  Risk  (05/18/2021)   Overall Financial Resource Strain (CARDIA)    Difficulty of Paying Living Expenses: Not hard at all  Food Insecurity: No Food Insecurity (05/18/2021)   Hunger Vital Sign    Worried About Running Out of Food in the Last Year: Never true    Ran Out of Food in the  Last Year: Never true  Transportation Needs: No Transportation Needs (05/18/2021)   PRAPARE - Hydrologist (Medical): No    Lack of Transportation (Non-Medical): No  Physical Activity: Sufficiently Active (05/18/2021)   Exercise Vital Sign    Days of Exercise per Week: 5 days    Minutes of Exercise per Session: 40 min  Stress: No Stress Concern Present (05/18/2021)   [place]    Feeling of Stress : Not at all  Social Connections: Moderately Integrated (05/18/2021)   Social Connection and Isolation Panel [NHANES]    Frequency of Communication with Friends and Family: More than three times a week    Frequency of Social Gatherings with Friends and Family: More than three times a week    Attends Religious Services: More than 4 times per year    Active Member of [employer] or Organizations: Yes    Attends Archivist Meetings: More than 4 times per year    Marital Status: Never married  Intimate Partner Violence: Not At Risk (05/18/2021)   Humiliation, Afraid, Rape, and Kick questionnaire    Fear of Current or Ex-Partner: No    Emotionally Abused: No    Physically Abused: No    Sexually Abused: No    Outpatient Medications Prior to Visit  Medication Sig Dispense Refill   atorvastatin (LIPITOR) 40 MG tablet TAKE 1 TABLET BY MOUTH DAILY 90 tablet 0   B-D UF III MINI PEN NEEDLES 31G X 5 MM MISC USE AS DIRECTED 100 each 3   Calcium Carbonate-Vitamin D (CALCIUM 500 + D PO) Take 1 tablet by mouth 2 (two) times daily.     fenofibrate 160 MG tablet TAKE 1 TABLET BY MOUTH  DAILY 90 tablet 1   hydrocortisone cream 1 %  Apply to affected area 2 times daily 15 g 0   insulin degludec (TRESIBA) 100 UNIT/ML FlexTouch Pen Inject 10 Units into the skin daily.     levothyroxine (SYNTHROID) 112 MCG tablet TAKE 1 TABLET BY MOUTH DAILY  BEFORE BREAKFAST 90 tablet 1   lisinopril (ZESTRIL) 5 MG tablet TAKE 1 TABLET BY MOUTH DAILY 100 tablet 2   meclizine (ANTIVERT) 12.5 MG tablet Take 1 tablet (12.5 mg total) by mouth 3 (three) times daily as needed for dizziness. 30 tablet 0   Multiple Vitamin (MULTIVITAMIN) tablet Take 1 tablet by mouth daily.     omeprazole (PRILOSEC) 40 MG capsule Take 40 mg by mouth daily.     ONETOUCH ULTRA test strip USE AS DIRECTED UP TO 4 TIMES DAILY 100 strip 1   tirzepatide (MOUNJARO) 5 MG/0.5ML Pen Inject 5 mg into the skin once a week. 2 mL 2   venlafaxine XR (EFFEXOR-XR) 150 MG 24 hr capsule TAKE 1 CAPSULE BY MOUTH DAILY 90 capsule 1   Vitamin D, Ergocalciferol, (DRISDOL) 1.25 MG (50000 UNIT) CAPS capsule TAKE 1 CAPSULE BY MOUTH  EVERY 7 DAYS 13 capsule 3   doxycycline (VIBRA-TABS) 100 MG tablet Take 1 tablet (100 mg total) by mouth 2 (two) times daily. (Patient not taking: Reported on 09/11/2021) 20 tablet 0   No facility-administered medications prior to visit.    No Known Allergies  Review of Systems  Constitutional:  Negative for fever and malaise/fatigue.  HENT:  Negative for congestion.   Eyes:  Negative for blurred vision.  Respiratory:  Negative for cough and shortness of breath.   Cardiovascular:  Negative for chest pain, palpitations  and leg swelling.  Gastrointestinal:  Negative for vomiting.  Musculoskeletal:  Negative for back pain.  Skin:  Negative for rash.  Neurological:  Negative for loss of consciousness and headaches.       Objective:    Physical Exam Vitals and nursing note reviewed.  Constitutional:      General: She is not in acute distress.    Appearance: Normal appearance. She is not ill-appearing.  HENT:     Head: Normocephalic and atraumatic.      Right Ear: External ear normal.     Left Ear: External ear normal.  Eyes:     Extraocular Movements: Extraocular movements intact.     Pupils: Pupils are equal, round, and reactive to light.  Cardiovascular:     Rate and Rhythm: Normal rate and regular rhythm.     Heart sounds: Murmur heard.     Systolic murmur is present with a grade of 4/6.     No gallop.  Pulmonary:     Effort: Pulmonary effort is normal. No respiratory distress.     Breath sounds: Normal breath sounds. No wheezing or rales.  Skin:    General: Skin is warm and dry.  Neurological:     Mental Status: She is alert and oriented to person, place, and time.  Psychiatric:        Judgment: Judgment normal.     BP 126/70 (BP Location: Right Arm, Patient Position: Sitting, Cuff Size: Large)   Pulse 76   Temp 98.6 F (37 C) (Oral)   Resp 18   Ht 5' 5.5" (1.664 m)   Wt 265 lb 9.6 oz (120.5 kg)   SpO2 96%   BMI 43.53 kg/m  Wt Readings from Last 3 Encounters:  09/11/21 265 lb 9.6 oz (120.5 kg)  08/12/21 272 lb 6.4 oz (123.6 kg)  08/11/21 262 lb (118.8 kg)    Diabetic Foot Exam - Simple   No data filed    Lab Results  Component Value Date   WBC 6.6 08/12/2021   HGB 12.5 08/12/2021   HCT 38.4 08/12/2021   PLT 341 08/12/2021   GLUCOSE 170 (H) 08/12/2021   CHOL 120 07/28/2021   TRIG 119.0 07/28/2021   HDL 31.10 (L) 07/28/2021   LDLDIRECT 193.3 11/06/2012   LDLCALC 65 07/28/2021   ALT 20 07/28/2021   AST 27 07/28/2021   NA 136 08/12/2021   K 4.4 08/12/2021   CL 107 08/12/2021   CREATININE 0.83 08/12/2021   BUN 22 08/12/2021   CO2 26 08/12/2021   TSH 0.83 07/28/2021   HGBA1C 7.5 (H) 07/28/2021   MICROALBUR <0.7 07/11/2020    Lab Results  Component Value Date   TSH 0.83 07/28/2021   Lab Results  Component Value Date   WBC 6.6 08/12/2021   HGB 12.5 08/12/2021   HCT 38.4 08/12/2021   MCV 92.3 08/12/2021   PLT 341 08/12/2021   Lab Results  Component Value Date   NA 136 08/12/2021   K 4.4  08/12/2021   CO2 26 08/12/2021   GLUCOSE 170 (H) 08/12/2021   BUN 22 08/12/2021   CREATININE 0.83 08/12/2021   BILITOT 0.5 07/28/2021   ALKPHOS 49 07/28/2021   AST 27 07/28/2021   ALT 20 07/28/2021   PROT 6.3 07/28/2021   ALBUMIN 4.1 07/28/2021   CALCIUM 9.3 08/12/2021   ANIONGAP 3 (L) 08/12/2021   GFR 83.91 07/28/2021   Lab Results  Component Value Date   CHOL 120 07/28/2021   Lab  Results  Component Value Date   HDL 31.10 (L) 07/28/2021   Lab Results  Component Value Date   LDLCALC 65 07/28/2021   Lab Results  Component Value Date   TRIG 119.0 07/28/2021   Lab Results  Component Value Date   CHOLHDL 4 07/28/2021   Lab Results  Component Value Date   HGBA1C 7.5 (H) 07/28/2021       Assessment & Plan:   Problem List Items Addressed This Visit       Unprioritized   Anxiety   Relevant Medications   clonazePAM (KLONOPIN) 0.5 MG tablet   Other Relevant Orders   Ambulatory referral to Psychiatry   Heart murmur    Check echo Refer to cardiology      DIZZINESS, CHRONIC    Improved Meclizine helps  ? Cardiac murmur-----  Vs anxiety       Other Visit Diagnoses     Murmur    -  Primary   Relevant Orders   ECHOCARDIOGRAM COMPLETE   Ambulatory referral to Cardiology   Dizzy       Relevant Orders   ECHOCARDIOGRAM COMPLETE   Ambulatory referral to Cardiology       Meds ordered this encounter  Medications   clonazePAM (KLONOPIN) 0.5 MG tablet    Sig: Take 1 tablet (0.5 mg total) by mouth 2 (two) times daily as needed for anxiety.    Dispense:  20 tablet    Refill:  1    I, [name], DO, personally preformed the services described in this documentation.  All medical record entries made by the scribe were at my direction and in my presence.  I have reviewed the chart and discharge instructions (if applicable) and agree that the record reflects my personal performance and is accurate and complete. 09/11/2021   I,Amber Collins,acting as  a scribe for [employer], DO.,have documented all relevant documentation on the behalf of [name], DO,as directed by  [name], DO while in the presence of [name], DO.   [name], DO

## 2021-09-11 NOTE — Assessment & Plan Note (Signed)
Check echo Refer to cardiology

## 2021-09-11 NOTE — Patient Instructions (Signed)

## 2021-09-11 NOTE — Assessment & Plan Note (Signed)
Improved Meclizine helps  ? Cardiac murmur-----  Vs anxiety

## 2021-09-25 ENCOUNTER — Ambulatory Visit (HOSPITAL_COMMUNITY): Payer: Medicare Other

## 2021-09-27 ENCOUNTER — Encounter (INDEPENDENT_AMBULATORY_CARE_PROVIDER_SITE_OTHER): Payer: Self-pay

## 2021-09-28 ENCOUNTER — Ambulatory Visit (INDEPENDENT_AMBULATORY_CARE_PROVIDER_SITE_OTHER): Payer: Medicare Other | Admitting: Behavioral Health

## 2021-09-28 ENCOUNTER — Encounter: Payer: Self-pay | Admitting: Behavioral Health

## 2021-09-28 VITALS — BP 156/73 | HR 76 | Ht 65.0 in | Wt 260.0 lb

## 2021-09-28 DIAGNOSIS — F411 Generalized anxiety disorder: Secondary | ICD-10-CM

## 2021-09-28 DIAGNOSIS — F331 Major depressive disorder, recurrent, moderate: Secondary | ICD-10-CM

## 2021-09-28 MED ORDER — VENLAFAXINE HCL 37.5 MG PO TABS: 37.5000 mg | ORAL_TABLET | Freq: Every day | ORAL | 1 refills | Status: DC

## 2021-09-28 MED ORDER — CLONAZEPAM 0.5 MG PO TABS: 0.5000 mg | ORAL_TABLET | Freq: Two times a day (BID) | ORAL | 2 refills | Status: DC | PRN

## 2021-09-28 NOTE — Progress Notes (Signed)
[facility] MD/PA/NP Initial Note  09/28/2021 9:28 AM Amy Mahoney  MRN:  [id number]  Chief Complaint:  Chief Complaint   Anxiety; Depression; Establish Care; Medication Refill; Patient Education     HPI:   "Amy Mahoney", 71 year old female presents to this office for initial visit and to establish care. On presentation she is very pleasant, alert, and articulate.  She is retired and is currently living with her 39 year old father to assist with his care.  She says that recently she has developed increased anxiety maybe with some mild depression due to fear of the possible changes when he passes.  She says her brother who she is very close to also has a terminal illness and lately she has been thinking about the changes if she loses the remaining family members.  She says that she and her brother are getting ready to purchase a larger home so they can all live together for better efficiency and closeness.  She says that she has been on Effexor for many years without changes.  Says that her PCP recently prescribed a small dose of Klonopin for her increased anxiety.  She said that she would like to consider making some adjustments to her medication to see if this will help curb some of the increased anxiety recently.  She says her anxiety level today is 5 of 10 and her depression is 2 of 10.  She is sleeping 7 to 8 hours per night.  She follows up with her PCP and other specialist regularly as needed.  She denies any history of mania, no psychosis denies any history of auditory or visual hallucinations.  Denies SI or HI.  Past psychotropic medications: Effexor Klonopin  Visit Diagnosis:    ICD-10-CM   1. Generalized anxiety disorder  F41.1 venlafaxine (EFFEXOR) 37.5 MG tablet    clonazePAM (KLONOPIN) 0.5 MG tablet    2. Major depressive disorder, recurrent episode, moderate (HCC)  F33.1 venlafaxine (EFFEXOR) 37.5 MG tablet    clonazePAM (KLONOPIN) 0.5 MG tablet      Past Psychiatric History:  Anxiety, MDD  Past Medical History:  Past Medical History:  Diagnosis Date   Abnormal esophagram    Abscess of skin 04/15/2015   Anxiety    Back pain    Bacterial vaginal infection 12/17/2014   Bronchitis with bronchospasm 04/14/2013   Chest heaviness 07/11/2020   Class 3 severe obesity with serious comorbidity and body mass index (BMI) of 40.0 to 44.9 in adult [facility]) 06/10/2018   Constipation    Cough 09/17/2013   DEPRESSION 09/05/2006   Qualifier: Diagnosis of  By: Jerold Coombe     Depression with anxiety 07/11/2020   Diabetes mellitus without complication ([place])    DIZZINESS, CHRONIC 05/23/2006   Qualifier: Diagnosis of  By: Jerold Coombe     Dysphagia    FATIGUE 06/01/2009   Qualifier: Diagnosis of  By: Jerold Coombe     Gallbladder problem    Gastroesophageal reflux disease 05/24/2017   Heart murmur    Hyperlipidemia    Hyperlipidemia associated with type 2 diabetes mellitus ([place]) 07/11/2020   Hyperlipidemia LDL goal <100 11/06/2012   Hypothyroidism 05/23/2006   Qualifier: Diagnosis of  By: Jerold Coombe     Lichen planus 37/16/9678   MORBID OBESITY 03/30/2008   Qualifier: Diagnosis of  By: Jerold Coombe     Muscle spasm of back 11/19/2014   Obstructive sleep apnea 05/23/2006   NPSG 08/03/94 RDI/AHI 49/hr, weight was 285 pounds CPAP/ Advanced  Pharyngitis 03/03/2017   Post-nasal drainage 08/03/2019   Last Assessment & Plan:  Formatting of this note might be different from the original. Concern over postnasal drainage. Chronic at least a year history of the above but over the last month seems to have worsened.  She has tried antihistamine therapy without improvement.  Denies obvious heartburn.  Some associated cough and occasional near emesis episodes.  She is not on an ACE inhibitor. EXAM show   Prediabetes    Preventative health care 01/10/2021   Primary hypertension 07/11/2020   Spider bite    Subungual hematoma of fifth toe of right foot 10/29/2017   Swelling    Type 2  diabetes mellitus with hyperglycemia ([place]) 06/10/2018    Past Surgical History:  Procedure Laterality Date   CHOLECYSTECTOMY     DILATION AND CURETTAGE OF UTERUS     ESOPHAGOGASTRODUODENOSCOPY (EGD) WITH PROPOFOL N/A 09/05/2017   Procedure: ESOPHAGOGASTRODUODENOSCOPY (EGD) WITH PROPOFOL;  Surgeon: Jerene Bears, MD;  Location: [facility];  Service: Gastroenterology;  Laterality: N/A;   fibroids scrapping     TONSILLECTOMY      Family Psychiatric History: see chart  Family History:  Family History  Problem Relation Age of Onset   Diabetes Mother    Thyroid disease Mother    Depression Mother    Anxiety disorder Brother    Diabetes Maternal Aunt    Lung cancer Maternal Aunt        Nonsmoker   Cancer Maternal Aunt        breast   Breast cancer Maternal Aunt    Cancer Maternal Aunt        lung(nonsmoker)   Breast cancer Paternal Grandmother    Depression Other    Hyperlipidemia Other    Arthritis Other    Colon cancer Neg Hx    Pancreatic cancer Neg Hx    Stomach cancer Neg Hx    Esophageal cancer Neg Hx    Rectal cancer Neg Hx     Social History:  Social History   Socioeconomic History   Marital status: Divorced    Spouse name: Not on file   Number of children: 0   Years of education: Not on file   Highest education level: Not on file  Occupational History   Occupation: Eps Chartered loss adjuster: [employer]    Employer: [employer]   Occupation: RETIRED  Tobacco Use   Smoking status: Former    Packs/day: 0.50    Years: 3.00    Total pack years: 1.50    Types: Cigarettes    Quit date: 08/10/1990    Years since quitting: 31.1   Smokeless tobacco: Never  Vaping Use   Vaping Use: Never used  Substance and Sexual Activity   Alcohol use: Yes    Comment: rarely   Drug use: No   Sexual activity: Not Currently    Partners: Male  Other Topics Concern   Not on file  Social History Narrative   Regular exercise - yes-- 10,000 steps   Caffeine  use: 2 cups of coffee daily   Live  in [place] with 56 year old father to assist in care. Will be moving in with brother and his wife into larger home to facilitate better care.    Social Determinants of Health   Financial Resource Strain: Low Risk  (05/18/2021)   Overall Financial Resource Strain (CARDIA)    Difficulty of Paying Living Expenses: Not hard at all  Food Insecurity:  No Food Insecurity (05/18/2021)   Hunger Vital Sign    Worried About Running Out of Food in the Last Year: Never true    Ran Out of Food in the Last Year: Never true  Transportation Needs: No Transportation Needs (05/18/2021)   PRAPARE - Hydrologist (Medical): No    Lack of Transportation (Non-Medical): No  Physical Activity: Sufficiently Active (05/18/2021)   Exercise Vital Sign    Days of Exercise per Week: 5 days    Minutes of Exercise per Session: 40 min  Stress: No Stress Concern Present (05/18/2021)   [place]    Feeling of Stress : Not at all  Social Connections: Moderately Integrated (05/18/2021)   Social Connection and Isolation Panel [NHANES]    Frequency of Communication with Friends and Family: More than three times a week    Frequency of Social Gatherings with Friends and Family: More than three times a week    Attends Religious Services: More than 4 times per year    Active Member of Clubs or Organizations: Yes    Attends Music therapist: More than 4 times per year    Marital Status: Never married    Allergies: No Known Allergies  Metabolic Disorder Labs: Lab Results  Component Value Date   HGBA1C 7.5 (H) 07/28/2021   No results found for: "PROLACTIN" Lab Results  Component Value Date   CHOL 120 07/28/2021   TRIG 119.0 07/28/2021   HDL 31.10 (L) 07/28/2021   CHOLHDL 4 07/28/2021   VLDL 23.8 07/28/2021   LDLCALC 65 07/28/2021   [place] 66 01/10/2021   Lab Results   Component Value Date   TSH 0.83 07/28/2021   TSH 2.29 07/11/2020    Therapeutic Level Labs: No results found for: "LITHIUM" No results found for: "VALPROATE" No results found for: "CBMZ"  Current Medications: Current Outpatient Medications  Medication Sig Dispense Refill   clonazePAM (KLONOPIN) 0.5 MG tablet Take 1 tablet (0.5 mg total) by mouth 2 (two) times daily as needed for anxiety. 30 tablet 2   venlafaxine (EFFEXOR) 37.5 MG tablet Take 1 tablet (37.5 mg total) by mouth daily. 30 tablet 1   atorvastatin (LIPITOR) 40 MG tablet TAKE 1 TABLET BY MOUTH DAILY 90 tablet 3   B-D UF III MINI PEN NEEDLES 31G X 5 MM MISC USE AS DIRECTED 100 each 3   Calcium Carbonate-Vitamin D (CALCIUM 500 + D PO) Take 1 tablet by mouth 2 (two) times daily.     clonazePAM (KLONOPIN) 0.5 MG tablet Take 1 tablet (0.5 mg total) by mouth 2 (two) times daily as needed for anxiety. 20 tablet 1   fenofibrate 160 MG tablet TAKE 1 TABLET BY MOUTH  DAILY 90 tablet 1   hydrocortisone cream 1 % Apply to affected area 2 times daily 15 g 0   insulin degludec (TRESIBA) 100 UNIT/ML FlexTouch Pen Inject 10 Units into the skin daily.     levothyroxine (SYNTHROID) 112 MCG tablet TAKE 1 TABLET BY MOUTH DAILY  BEFORE BREAKFAST 90 tablet 1   lisinopril (ZESTRIL) 5 MG tablet TAKE 1 TABLET BY MOUTH DAILY 100 tablet 2   meclizine (ANTIVERT) 12.5 MG tablet Take 1 tablet (12.5 mg total) by mouth 3 (three) times daily as needed for dizziness. 30 tablet 0   Multiple Vitamin (MULTIVITAMIN) tablet Take 1 tablet by mouth daily.     omeprazole (PRILOSEC) 40 MG capsule Take 40 mg by  mouth daily.     ONETOUCH ULTRA test strip USE AS DIRECTED UP TO 4 TIMES DAILY 100 strip 1   tirzepatide (MOUNJARO) 5 MG/0.5ML Pen Inject 5 mg into the skin once a week. 2 mL 2   venlafaxine XR (EFFEXOR-XR) 150 MG 24 hr capsule TAKE 1 CAPSULE BY MOUTH DAILY 90 capsule 1   Vitamin D, Ergocalciferol, (DRISDOL) 1.25 MG (50000 UNIT) CAPS capsule TAKE 1 CAPSULE BY  MOUTH  EVERY 7 DAYS 13 capsule 3   No current facility-administered medications for this visit.    Medication Side Effects: none  Orders placed this visit:  No orders of the defined types were placed in this encounter.   Psychiatric Specialty Exam:  Review of Systems  Constitutional: Negative.   Allergic/Immunologic: Negative.   Neurological: Negative.   Psychiatric/Behavioral:  Positive for dysphoric mood. The patient is nervous/anxious.     There were no vitals taken for this visit.There is no height or weight on file to calculate BMI.  General Appearance: Casual, Neat, and Well Groomed  Eye Contact:  Good  Speech:  Clear and Coherent  Volume:  Normal  Mood:  Anxious and Depressed  Affect:  Appropriate  Thought Process:  Coherent  Orientation:  Full (Time, Place, and Person)  Thought Content: Logical   Suicidal Thoughts:  No  Homicidal Thoughts:  No  Memory:  WNL  Judgement:  Good  Insight:  Good  Psychomotor Activity:  Normal  Concentration:  Concentration: Good  Recall:  Good  Fund of Knowledge: Good  Language: Good  Assets:  Desire for Improvement  ADL's:  Intact  Cognition: WNL  Prognosis:  Good   Screenings:  PHQ2-9    [facility] Visit from 09/28/2021 in [facility] Visit from 08/08/2021 in [facility] [place] from 05/18/2021 in [facility] [place] Visit from 07/11/2020 in [facility] [place] Visit from 01/01/2018 in [facility]  PHQ-2 Total Score 1 0 0 0 3  PHQ-9 Total Score -- 0 -- -- 10      [facility] from 08/12/2021 in [place] [facility] from 07/31/2021 in [place] No Risk No Risk       Receiving Psychotherapy: No   Treatment Plan/Recommendations:   Greater than 50% of 60 min face  to face time with patient was spent on counseling and coordination of care. We discussed  her long hx of predominately anxiety with some mild depression. She has been on Effexor for many years. We discussed her prior tx, reviewed medications and plan of care. Discussed goals for treatment here today. We agreed to: To increase Effexor to 187.5 mg daily.  Patient will have to continue 150 mg ER Capsule combined with a 37.5 mg capsule daily. Two separate RX. To continue Klonopin 0.5 mg once daily for severe anxiety Will report worsening symptoms or side effects promptly Provided emergency contact information Will follow-up in 4 weeks to reassess Discussed potential benefits, risk, and side effects of benzodiazepines to include potential risk of tolerance and dependence, as well as possible drowsiness.  Advised patient not to drive if experiencing drowsiness and to take lowest possible effective dose to minimize risk of dependence and tolerance.  Reviewed PDMP            Elwanda [place], NP

## 2021-10-04 ENCOUNTER — Ambulatory Visit (HOSPITAL_BASED_OUTPATIENT_CLINIC_OR_DEPARTMENT_OTHER)
Admission: RE | Admit: 2021-10-04 | Discharge: 2021-10-04 | Disposition: A | Discharge status: Home / Self Care | Payer: Medicare Other | Source: Ambulatory Visit | Attending: Family Medicine | Admitting: Family Medicine

## 2021-10-04 DIAGNOSIS — I350 Nonrheumatic aortic (valve) stenosis: Secondary | ICD-10-CM

## 2021-10-04 DIAGNOSIS — R42 Dizziness and giddiness: Secondary | ICD-10-CM | POA: Insufficient documentation

## 2021-10-04 DIAGNOSIS — R011 Cardiac murmur, unspecified: Secondary | ICD-10-CM | POA: Diagnosis not present

## 2021-10-04 LAB — ECHOCARDIOGRAM COMPLETE
AR max vel: 1.45 cm2
AV Area VTI: 1.55 cm2
AV Area mean vel: 1.5 cm2
AV Mean grad: 15 mmHg
AV Peak grad: 29.4 mmHg
Ao pk vel: 2.71 m/s
Area-P 1/2: 2.76 cm2
S' Lateral: 2.6 cm

## 2021-10-06 ENCOUNTER — Other Ambulatory Visit: Payer: Self-pay | Admitting: Family Medicine

## 2021-10-06 DIAGNOSIS — E038 Other specified hypothyroidism: Secondary | ICD-10-CM

## 2021-10-19 DIAGNOSIS — E119 Type 2 diabetes mellitus without complications: Secondary | ICD-10-CM | POA: Diagnosis not present

## 2021-10-19 DIAGNOSIS — H2513 Age-related nuclear cataract, bilateral: Secondary | ICD-10-CM | POA: Diagnosis not present

## 2021-10-19 DIAGNOSIS — H52203 Unspecified astigmatism, bilateral: Secondary | ICD-10-CM | POA: Diagnosis not present

## 2021-10-19 LAB — HM DIABETES EYE EXAM

## 2021-10-21 ENCOUNTER — Other Ambulatory Visit: Payer: Self-pay | Admitting: Behavioral Health

## 2021-10-21 DIAGNOSIS — F331 Major depressive disorder, recurrent, moderate: Secondary | ICD-10-CM

## 2021-10-21 DIAGNOSIS — F411 Generalized anxiety disorder: Secondary | ICD-10-CM

## 2021-10-24 ENCOUNTER — Ambulatory Visit (INDEPENDENT_AMBULATORY_CARE_PROVIDER_SITE_OTHER): Payer: Medicare Other | Admitting: Family Medicine

## 2021-10-24 ENCOUNTER — Encounter: Payer: Self-pay | Admitting: Family Medicine

## 2021-10-24 VITALS — BP 128/86 | HR 81 | Temp 98.3°F | Resp 18 | Ht 65.0 in | Wt 257.4 lb

## 2021-10-24 DIAGNOSIS — L0291 Cutaneous abscess, unspecified: Secondary | ICD-10-CM | POA: Diagnosis not present

## 2021-10-24 MED ORDER — DOXYCYCLINE HYCLATE 100 MG PO TABS: 100.0000 mg | ORAL_TABLET | Freq: Two times a day (BID) | ORAL | 0 refills | Status: DC

## 2021-10-24 NOTE — Progress Notes (Addendum)
Subjective:   By signing my name below, I, [name], attest that this documentation has been prepared under the direction and in the presence of Dr. Roma Schanz, DO. 10/24/2021    Patient ID: Amy Mahoney, female    DOB: 02/11/1951, 71 y.o.   MRN: [id number]  Chief Complaint  Patient presents with   Rash    X1 week, pt has two stops on her stomach that are open, red, and sore.     Rash Pertinent negatives include no congestion, cough, diarrhea, fever, shortness of breath or vomiting.   Patient is in today for a office visit.   She complains of boil on her lower abdomen. She has surrounding redness. It is not draining. She is applying an anti-biotic ointment and bandage to the the area 2x daily.    Past Medical History:  Diagnosis Date   Abnormal esophagram    Abscess of skin 04/15/2015   Anxiety    Back pain    Bacterial vaginal infection 12/17/2014   Bronchitis with bronchospasm 04/14/2013   Chest heaviness 07/11/2020   Class 3 severe obesity with serious comorbidity and body mass index (BMI) of 40.0 to 44.9 in adult [facility]) 06/10/2018   Constipation    Cough 09/17/2013   DEPRESSION 09/05/2006   Qualifier: Diagnosis of  By: Jerold Coombe     Depression with anxiety 07/11/2020   Diabetes mellitus without complication ([place])    DIZZINESS, CHRONIC 05/23/2006   Qualifier: Diagnosis of  By: Jerold Coombe     Dysphagia    FATIGUE 06/01/2009   Qualifier: Diagnosis of  By: Jerold Coombe     Gallbladder problem    Gastroesophageal reflux disease 05/24/2017   Heart murmur    Hyperlipidemia    Hyperlipidemia associated with type 2 diabetes mellitus ([place]) 07/11/2020   Hyperlipidemia LDL goal <100 11/06/2012   Hypothyroidism 05/23/2006   Qualifier: Diagnosis of  By: Jerold Coombe     Lichen planus 58/52/7782   MORBID OBESITY 03/30/2008   Qualifier: Diagnosis of  By: Jerold Coombe     Muscle spasm of back 11/19/2014   Obstructive sleep apnea 05/23/2006   NPSG 08/03/94  RDI/AHI 49/hr, weight was 285 pounds CPAP/ Advanced    Pharyngitis 03/03/2017   Post-nasal drainage 08/03/2019   Last Assessment & Plan:  Formatting of this note might be different from the original. Concern over postnasal drainage. Chronic at least a year history of the above but over the last month seems to have worsened.  She has tried antihistamine therapy without improvement.  Denies obvious heartburn.  Some associated cough and occasional near emesis episodes.  She is not on an ACE inhibitor. EXAM show   Prediabetes    Preventative health care 01/10/2021   Primary hypertension 07/11/2020   Spider bite    Subungual hematoma of fifth toe of right foot 10/29/2017   Swelling    Type 2 diabetes mellitus with hyperglycemia ([place]) 06/10/2018    Past Surgical History:  Procedure Laterality Date   CHOLECYSTECTOMY     DILATION AND CURETTAGE OF UTERUS     ESOPHAGOGASTRODUODENOSCOPY (EGD) WITH PROPOFOL N/A 09/05/2017   Procedure: ESOPHAGOGASTRODUODENOSCOPY (EGD) WITH PROPOFOL;  Surgeon: Jerene Bears, MD;  Location: [facility];  Service: Gastroenterology;  Laterality: N/A;   fibroids scrapping     TONSILLECTOMY      Family History  Problem Relation Age of Onset   Diabetes Mother    Thyroid disease Mother  Depression Mother    Anxiety disorder Brother    Diabetes Maternal Aunt    Lung cancer Maternal Aunt        Nonsmoker   Cancer Maternal Aunt        breast   Breast cancer Maternal Aunt    Cancer Maternal Aunt        lung(nonsmoker)   Breast cancer Paternal Grandmother    Depression Other    Hyperlipidemia Other    Arthritis Other    Colon cancer Neg Hx    Pancreatic cancer Neg Hx    Stomach cancer Neg Hx    Esophageal cancer Neg Hx    Rectal cancer Neg Hx     Social History   Socioeconomic History   Marital status: Divorced    Spouse name: Not on file   Number of children: 0   Years of education: Not on file   Highest education level: Not on file  Occupational History    Occupation: Eps Chartered loss adjuster: [employer]    Employer: [employer]   Occupation: RETIRED  Tobacco Use   Smoking status: Former    Packs/day: 0.50    Years: 3.00    Total pack years: 1.50    Types: Cigarettes    Quit date: 08/10/1990    Years since quitting: 31.2   Smokeless tobacco: Never  Vaping Use   Vaping Use: Never used  Substance and Sexual Activity   Alcohol use: Yes    Comment: rarely   Drug use: No   Sexual activity: Not Currently    Partners: Male  Other Topics Concern   Not on file  Social History Narrative   Regular exercise - yes-- 10,000 steps   Caffeine use: 2 cups of coffee daily   Live  in [place] with 110 year old father to assist in care. Will be moving in with brother and his wife into larger home to facilitate better care.    Social Determinants of Health   Financial Resource Strain: Low Risk  (05/18/2021)   Overall Financial Resource Strain (CARDIA)    Difficulty of Paying Living Expenses: Not hard at all  Food Insecurity: No Food Insecurity (05/18/2021)   Hunger Vital Sign    Worried About Running Out of Food in the Last Year: Never true    Ran Out of Food in the Last Year: Never true  Transportation Needs: No Transportation Needs (05/18/2021)   PRAPARE - Hydrologist (Medical): No    Lack of Transportation (Non-Medical): No  Physical Activity: Sufficiently Active (05/18/2021)   Exercise Vital Sign    Days of Exercise per Week: 5 days    Minutes of Exercise per Session: 40 min  Stress: No Stress Concern Present (05/18/2021)   [place]    Feeling of Stress : Not at all  Social Connections: Moderately Integrated (05/18/2021)   Social Connection and Isolation Panel [NHANES]    Frequency of Communication with Friends and Family: More than three times a week    Frequency of Social Gatherings with Friends and Family: More than three  times a week    Attends Religious Services: More than 4 times per year    Active Member of [employer] or Organizations: Yes    Attends Archivist Meetings: More than 4 times per year    Marital Status: Never married  Intimate Partner Violence: Not At Risk (05/18/2021)  Humiliation, Afraid, Rape, and Kick questionnaire    Fear of Current or Ex-Partner: No    Emotionally Abused: No    Physically Abused: No    Sexually Abused: No    Outpatient Medications Prior to Visit  Medication Sig Dispense Refill   atorvastatin (LIPITOR) 40 MG tablet TAKE 1 TABLET BY MOUTH DAILY 90 tablet 3   B-D UF III MINI PEN NEEDLES 31G X 5 MM MISC USE AS DIRECTED 100 each 3   Calcium Carbonate-Vitamin D (CALCIUM 500 + D PO) Take 1 tablet by mouth 2 (two) times daily.     clonazePAM (KLONOPIN) 0.5 MG tablet Take 1 tablet (0.5 mg total) by mouth 2 (two) times daily as needed for anxiety. 20 tablet 1   clonazePAM (KLONOPIN) 0.5 MG tablet Take 1 tablet (0.5 mg total) by mouth 2 (two) times daily as needed for anxiety. 30 tablet 2   fenofibrate 160 MG tablet TAKE 1 TABLET BY MOUTH  DAILY 90 tablet 1   hydrocortisone cream 1 % Apply to affected area 2 times daily 15 g 0   insulin degludec (TRESIBA) 100 UNIT/ML FlexTouch Pen Inject 10 Units into the skin daily.     levothyroxine (SYNTHROID) 112 MCG tablet Take 1 tablet (112 mcg total) by mouth daily before breakfast. 90 tablet 1   lisinopril (ZESTRIL) 5 MG tablet TAKE 1 TABLET BY MOUTH DAILY 100 tablet 2   meclizine (ANTIVERT) 12.5 MG tablet Take 1 tablet (12.5 mg total) by mouth 3 (three) times daily as needed for dizziness. 30 tablet 0   Multiple Vitamin (MULTIVITAMIN) tablet Take 1 tablet by mouth daily.     omeprazole (PRILOSEC) 40 MG capsule Take 40 mg by mouth daily.     ONETOUCH ULTRA test strip USE AS DIRECTED UP TO 4 TIMES DAILY 100 strip 1   tirzepatide (MOUNJARO) 5 MG/0.5ML Pen Inject 5 mg into the skin once a week. 2 mL 2   venlafaxine (EFFEXOR) 37.5 MG  tablet TAKE 1 TABLET BY MOUTH EVERY DAY 90 tablet 1   venlafaxine XR (EFFEXOR-XR) 150 MG 24 hr capsule TAKE 1 CAPSULE BY MOUTH DAILY 90 capsule 1   Vitamin D, Ergocalciferol, (DRISDOL) 1.25 MG (50000 UNIT) CAPS capsule TAKE 1 CAPSULE BY MOUTH  EVERY 7 DAYS 13 capsule 3   No facility-administered medications prior to visit.    No Known Allergies  Review of Systems  Constitutional:  Negative for chills, fever and malaise/fatigue.  HENT:  Negative for congestion and hearing loss.   Eyes:  Negative for discharge.  Respiratory:  Negative for cough, sputum production and shortness of breath.   Cardiovascular:  Negative for chest pain, palpitations and leg swelling.  Gastrointestinal:  Negative for abdominal pain, blood in stool, constipation, diarrhea, heartburn, nausea and vomiting.  Genitourinary:  Negative for dysuria, frequency, hematuria and urgency.  Musculoskeletal:  Negative for back pain, falls and myalgias.  Skin:  Positive for rash.       (+)boil on lower abdomen  Neurological:  Negative for dizziness, sensory change, loss of consciousness, weakness and headaches.  Endo/Heme/Allergies:  Negative for environmental allergies. Does not bruise/bleed easily.  Psychiatric/Behavioral:  Negative for depression and suicidal ideas. The patient is not nervous/anxious and does not have insomnia.        Objective:    Physical Exam Vitals and nursing note reviewed.  Constitutional:      General: She is not in acute distress.    Appearance: Normal appearance. She is not ill-appearing.  HENT:  Head: Normocephalic and atraumatic.     Right Ear: External ear normal.     Left Ear: External ear normal.  Eyes:     Extraocular Movements: Extraocular movements intact.     Pupils: Pupils are equal, round, and reactive to light.  Cardiovascular:     Rate and Rhythm: Normal rate and regular rhythm.     Heart sounds: Normal heart sounds. No murmur heard.    No gallop.  Pulmonary:      Effort: Pulmonary effort is normal. No respiratory distress.     Breath sounds: Normal breath sounds. No wheezing or rales.  Skin:    General: Skin is warm and dry.     Findings: Abscess (middle abdomen) and lesion present.     Comments: Abbscess --- open , not draining now Min reddness surrounding it Abx oint and bandage placed  Neurological:     Mental Status: She is alert and oriented to person, place, and time.  Psychiatric:        Judgment: Judgment normal.     BP 128/86 (BP Location: Left Arm, Patient Position: Sitting, Cuff Size: Large)   Pulse 81   Temp 98.3 F (36.8 C) (Oral)   Resp 18   Ht '5\' 5"'$  (1.651 m)   Wt 257 lb 6.4 oz (116.8 kg)   SpO2 98%   BMI 42.83 kg/m  Wt Readings from Last 3 Encounters:  10/24/21 257 lb 6.4 oz (116.8 kg)  09/11/21 265 lb 9.6 oz (120.5 kg)  08/12/21 272 lb 6.4 oz (123.6 kg)    Diabetic Foot Exam - Simple   No data filed    Lab Results  Component Value Date   WBC 6.6 08/12/2021   HGB 12.5 08/12/2021   HCT 38.4 08/12/2021   PLT 341 08/12/2021   GLUCOSE 170 (H) 08/12/2021   CHOL 120 07/28/2021   TRIG 119.0 07/28/2021   HDL 31.10 (L) 07/28/2021   LDLDIRECT 193.3 11/06/2012   LDLCALC 65 07/28/2021   ALT 20 07/28/2021   AST 27 07/28/2021   NA 136 08/12/2021   K 4.4 08/12/2021   CL 107 08/12/2021   CREATININE 0.83 08/12/2021   BUN 22 08/12/2021   CO2 26 08/12/2021   TSH 0.83 07/28/2021   HGBA1C 7.5 (H) 07/28/2021   MICROALBUR <0.7 07/11/2020    Lab Results  Component Value Date   TSH 0.83 07/28/2021   Lab Results  Component Value Date   WBC 6.6 08/12/2021   HGB 12.5 08/12/2021   HCT 38.4 08/12/2021   MCV 92.3 08/12/2021   PLT 341 08/12/2021   Lab Results  Component Value Date   NA 136 08/12/2021   K 4.4 08/12/2021   CO2 26 08/12/2021   GLUCOSE 170 (H) 08/12/2021   BUN 22 08/12/2021   CREATININE 0.83 08/12/2021   BILITOT 0.5 07/28/2021   ALKPHOS 49 07/28/2021   AST 27 07/28/2021   ALT 20 07/28/2021    PROT 6.3 07/28/2021   ALBUMIN 4.1 07/28/2021   CALCIUM 9.3 08/12/2021   ANIONGAP 3 (L) 08/12/2021   GFR 83.91 07/28/2021   Lab Results  Component Value Date   CHOL 120 07/28/2021   Lab Results  Component Value Date   HDL 31.10 (L) 07/28/2021   Lab Results  Component Value Date   LDLCALC 65 07/28/2021   Lab Results  Component Value Date   TRIG 119.0 07/28/2021   Lab Results  Component Value Date   CHOLHDL 4 07/28/2021   Lab Results  Component Value Date   HGBA1C 7.5 (H) 07/28/2021       Assessment & Plan:   Problem List Items Addressed This Visit   None Visit Diagnoses     Abscess    -  Primary   Relevant Medications   doxycycline (VIBRA-TABS) 100 MG tablet     Warm compresses  Abx per orders Rto if it does not resolve completely   Meds ordered this encounter  Medications   doxycycline (VIBRA-TABS) 100 MG tablet    Sig: Take 1 tablet (100 mg total) by mouth 2 (two) times daily.    Dispense:  20 tablet    Refill:  0    I, [name], DO, personally preformed the services described in this documentation.  All medical record entries made by the scribe were at my direction and in my presence.  I have reviewed the chart and discharge instructions (if applicable) and agree that the record reflects my personal performance and is accurate and complete. 10/24/2021   I,[name],acting as a scribe for [name], DO.,have documented all relevant documentation on the behalf of [name], DO,as directed by  [name], DO while in the presence of [name], DO.   [name], DO

## 2021-10-31 ENCOUNTER — Encounter: Payer: Self-pay | Admitting: Behavioral Health

## 2021-10-31 ENCOUNTER — Ambulatory Visit (INDEPENDENT_AMBULATORY_CARE_PROVIDER_SITE_OTHER): Payer: Medicare Other | Admitting: Behavioral Health

## 2021-10-31 DIAGNOSIS — F331 Major depressive disorder, recurrent, moderate: Secondary | ICD-10-CM

## 2021-10-31 DIAGNOSIS — F411 Generalized anxiety disorder: Secondary | ICD-10-CM

## 2021-10-31 MED ORDER — CLONAZEPAM 0.5 MG PO TABS: 0.5000 mg | ORAL_TABLET | Freq: Two times a day (BID) | ORAL | 3 refills | Status: DC | PRN

## 2021-10-31 MED ORDER — VENLAFAXINE HCL 37.5 MG PO TABS: 37.5000 mg | ORAL_TABLET | Freq: Every day | ORAL | 1 refills | Status: DC

## 2021-10-31 NOTE — Progress Notes (Signed)
[facility]  Patient ID: Amy Mahoney,  MRN: [id number]  PCP: [name], DO  Date of Evaluation: 10/31/2021 Time spent:20 minutes  Chief Complaint:  Chief Complaint   Anxiety; Depression     HISTORY/CURRENT STATUS: HPI  "Amy Mahoney", 71 year old female presents to this office for follow up and medication management. Says that she currently feels stable and content. She doe not want to make any additional medication changes at this time.  She says her anxiety level today is 3 of 10 and her depression is 1 of 10.  She is sleeping 7 to 8 hours per night.  She follows up with her PCP and other specialist regularly as needed.  She denies any history of mania, no psychosis denies any history of auditory or visual hallucinations.  Denies SI or HI.  Past psychotropic medications: Effexor Klonopin     Individual Medical History/ Review of Systems: Changes? :No   Allergies: Patient has no known allergies.  Current Medications:  Current Outpatient Medications:    atorvastatin (LIPITOR) 40 MG tablet, TAKE 1 TABLET BY MOUTH DAILY, Disp: 90 tablet, Rfl: 3   B-D UF III MINI PEN NEEDLES 31G X 5 MM MISC, USE AS DIRECTED, Disp: 100 each, Rfl: 3   Calcium Carbonate-Vitamin D (CALCIUM 500 + D PO), Take 1 tablet by mouth 2 (two) times daily., Disp: , Rfl:    clonazePAM (KLONOPIN) 0.5 MG tablet, Take 1 tablet (0.5 mg total) by mouth 2 (two) times daily as needed for anxiety., Disp: 20 tablet, Rfl: 1   [START ON 11/23/2021] clonazePAM (KLONOPIN) 0.5 MG tablet, Take 1 tablet (0.5 mg total) by mouth 2 (two) times daily as needed for anxiety., Disp: 30 tablet, Rfl: 3   doxycycline (VIBRA-TABS) 100 MG tablet, Take 1 tablet (100 mg total) by mouth 2 (two) times daily., Disp: 20 tablet, Rfl: 0   fenofibrate 160 MG tablet, TAKE 1 TABLET BY MOUTH  DAILY, Disp: 90 tablet, Rfl: 1   hydrocortisone cream 1 %, Apply to affected area 2 times daily, Disp: 15 g, Rfl: 0   insulin degludec  (TRESIBA) 100 UNIT/ML FlexTouch Pen, Inject 10 Units into the skin daily., Disp: , Rfl:    levothyroxine (SYNTHROID) 112 MCG tablet, Take 1 tablet (112 mcg total) by mouth daily before breakfast., Disp: 90 tablet, Rfl: 1   lisinopril (ZESTRIL) 5 MG tablet, TAKE 1 TABLET BY MOUTH DAILY, Disp: 100 tablet, Rfl: 2   meclizine (ANTIVERT) 12.5 MG tablet, Take 1 tablet (12.5 mg total) by mouth 3 (three) times daily as needed for dizziness., Disp: 30 tablet, Rfl: 0   Multiple Vitamin (MULTIVITAMIN) tablet, Take 1 tablet by mouth daily., Disp: , Rfl:    omeprazole (PRILOSEC) 40 MG capsule, Take 40 mg by mouth daily., Disp: , Rfl:    ONETOUCH ULTRA test strip, USE AS DIRECTED UP TO 4 TIMES DAILY, Disp: 100 strip, Rfl: 1   tirzepatide (MOUNJARO) 5 MG/0.5ML Pen, Inject 5 mg into the skin once a week., Disp: 2 mL, Rfl: 2   venlafaxine (EFFEXOR) 37.5 MG tablet, Take 1 tablet (37.5 mg total) by mouth daily., Disp: 90 tablet, Rfl: 1   venlafaxine XR (EFFEXOR-XR) 150 MG 24 hr capsule, TAKE 1 CAPSULE BY MOUTH DAILY, Disp: 90 capsule, Rfl: 1   Vitamin D, Ergocalciferol, (DRISDOL) 1.25 MG (50000 UNIT) CAPS capsule, TAKE 1 CAPSULE BY MOUTH  EVERY 7 DAYS, Disp: 13 capsule, Rfl: 3 Medication Side Effects: none  Family Medical/ Social History: Changes? No  MENTAL HEALTH EXAM:  There were no vitals taken for this visit.There is no height or weight on file to calculate BMI.  General Appearance: Casual, Neat, and Well Groomed  Eye Contact:  Good  Speech:  Clear and Coherent  Volume:  Normal  Mood:  NA  Affect:  Appropriate  Thought Process:  Coherent  Orientation:  Full (Time, Place, and Person)  Thought Content: Logical   Suicidal Thoughts:  No  Homicidal Thoughts:  No  Memory:  WNL  Judgement:  Good  Insight:  Good  Psychomotor Activity:  Normal  Concentration:  Concentration: Good  Recall:  Good  Fund of Knowledge: Good  Language: Good  Assets:  Desire for Improvement  ADL's:  Intact  Cognition: WNL   Prognosis:  Good    DIAGNOSES:    ICD-10-CM   1. Generalized anxiety disorder  F41.1 clonazePAM (KLONOPIN) 0.5 MG tablet    venlafaxine (EFFEXOR) 37.5 MG tablet    2. Major depressive disorder, recurrent episode, moderate (HCC)  F33.1 clonazePAM (KLONOPIN) 0.5 MG tablet    venlafaxine (EFFEXOR) 37.5 MG tablet      Receiving Psychotherapy: No    RECOMMENDATIONS:  Greater than 50% of 20  min face to face time with patient was spent on counseling and coordination of care. Discussed her current level of stability. She is very happy with her medications right now and does not want to make any changes at this time.  We agreed to: To continue Effexor to 187.5 mg daily.  Patient will have to continue 150 mg ER Capsule combined with a 37.5 mg capsule daily. Two separate RX. To continue Klonopin 0.5 mg once daily for severe anxiety Will report worsening symptoms or side effects promptly Provided emergency contact information Will follow-up in 4 weeks to reassess Discussed potential benefits, risk, and side effects of benzodiazepines to include potential risk of tolerance and dependence, as well as possible drowsiness.  Advised patient not to drive if experiencing drowsiness and to take lowest possible effective dose to minimize risk of dependence and tolerance.  Reviewed PDMP         Elwanda [place], NP

## 2021-11-07 ENCOUNTER — Other Ambulatory Visit: Payer: Self-pay | Admitting: Family Medicine

## 2021-11-07 DIAGNOSIS — E785 Hyperlipidemia, unspecified: Secondary | ICD-10-CM

## 2021-11-24 ENCOUNTER — Other Ambulatory Visit: Payer: Self-pay | Admitting: Family Medicine

## 2021-11-24 DIAGNOSIS — E559 Vitamin D deficiency, unspecified: Secondary | ICD-10-CM

## 2021-11-29 ENCOUNTER — Ambulatory Visit: Payer: Medicare Other | Attending: Cardiology | Admitting: Cardiology

## 2021-11-29 ENCOUNTER — Encounter: Payer: Self-pay | Admitting: Cardiology

## 2021-11-29 VITALS — BP 136/72 | HR 90 | Ht 65.5 in | Wt 258.0 lb

## 2021-11-29 DIAGNOSIS — I350 Nonrheumatic aortic (valve) stenosis: Secondary | ICD-10-CM | POA: Insufficient documentation

## 2021-11-29 DIAGNOSIS — I10 Essential (primary) hypertension: Secondary | ICD-10-CM

## 2021-11-29 DIAGNOSIS — E782 Mixed hyperlipidemia: Secondary | ICD-10-CM

## 2021-11-29 HISTORY — DX: Nonrheumatic aortic (valve) stenosis: I35.0

## 2021-11-29 NOTE — Progress Notes (Signed)
Cardiology Office Note:    Date:  11/29/2021   ID:  Amy Mahoney, DOB 1950-08-28, MRN [id number]  PCP:  Amy Mahoney, Amy Apa, DO  Cardiologist:  Amy Lindau, MD   Referring MD: Amy Mahoney, Amy Mahoney, *    ASSESSMENT:    1. Mixed hyperlipidemia   2. Primary hypertension   3. MORBID OBESITY   4. Mild aortic stenosis    PLAN:    In order of problems listed above:  Essential hypertension: Primary prevention stressed to the patient and the importance of compliance with diet and medications.  And she vocalized understanding.  She was advised to walk at least half an hour a day 5 days a week and she promises to do so. Mild aortic stenosis: Asymptomatic.  Stable at this time.  Medical management. Morbid obesity: Weight reduction stressed.  Risks of obesity explained and she promises to do better. Mixed dyslipidemia: On lipid lowering medications followed by primary care.  Lipids were reviewed from [place]. Diabetes mellitus: Hemoglobin A1c is elevated and I counseled her about this.  This is managed by primary care.  Risks advised. Patient will be seen in follow-up appointment in 6 months or earlier if the patient has any concerns    Medication Adjustments/Labs and Tests Ordered: Current medicines are reviewed at length with the patient today.  Concerns regarding medicines are outlined above.  No orders of the defined types were placed in this encounter.  No orders of the defined types were placed in this encounter.    No chief complaint on file.    History of Present Illness:    Amy Mahoney is a 71 y.o. female.  Patient has past medical history of mild aortic stenosis, essential hypertension, mixed dyslipidemia and morbid obesity.  She denies any problems at this time and history of activities of daily living.  No chest pain orthopnea or PND.  At the time of my evaluation, the patient is alert awake oriented and in no distress.  Past Medical History:  Diagnosis Date    Abnormal esophagram    Abscess of skin 04/15/2015   Anxiety    Back pain    Bacterial vaginal infection 12/17/2014   Bronchitis with bronchospasm 04/14/2013   Chest heaviness 07/11/2020   Class 3 severe obesity with serious comorbidity and body mass index (BMI) of 40.0 to 44.9 in adult [facility] 06/10/2018   Constipation    Cough 09/17/2013   DEPRESSION 09/05/2006   Qualifier: Diagnosis of  By: Jerold Coombe     Depression with anxiety 07/11/2020   Diabetes mellitus without complication ([place])    DIZZINESS, CHRONIC 05/23/2006   Qualifier: Diagnosis of  By: Jerold Coombe     Dysphagia    FATIGUE 06/01/2009   Qualifier: Diagnosis of  By: Jerold Coombe     Gallbladder problem    Gastroesophageal reflux disease 05/24/2017   Heart murmur    Hyperlipidemia    Hyperlipidemia associated with type 2 diabetes mellitus ([place]) 07/11/2020   Hyperlipidemia LDL goal <100 11/06/2012   Hypothyroidism 05/23/2006   Qualifier: Diagnosis of  By: Jerold Coombe     Lichen planus 89/38/1017   MORBID OBESITY 03/30/2008   Qualifier: Diagnosis of  By: Jerold Coombe     Muscle spasm of back 11/19/2014   Obstructive sleep apnea 05/23/2006   NPSG 08/03/94 RDI/AHI 49/hr, weight was 285 pounds CPAP/ Advanced    Paronychia of index finger 08/08/2021   Pharyngitis 03/03/2017  Post-nasal drainage 08/03/2019   Last Assessment & Plan:  Formatting of this note might be different from the original. Concern over postnasal drainage. Chronic at least a year history of the above but over the last month seems to have worsened.  She has tried antihistamine therapy without improvement.  Denies obvious heartburn.  Some associated cough and occasional near emesis episodes.  She is not on an ACE inhibitor. EXAM show   Prediabetes    Preventative health care 01/10/2021   Primary hypertension 07/11/2020   Spider bite    Subungual hematoma of fifth toe of right foot 10/29/2017   Swelling    Type 2 diabetes mellitus with hyperglycemia ([place])  06/10/2018    Past Surgical History:  Procedure Laterality Date   CHOLECYSTECTOMY     DILATION AND CURETTAGE OF UTERUS     ESOPHAGOGASTRODUODENOSCOPY (EGD) WITH PROPOFOL N/A 09/05/2017   Procedure: ESOPHAGOGASTRODUODENOSCOPY (EGD) WITH PROPOFOL;  Surgeon: Jerene Bears, MD;  Location: [facility];  Service: Gastroenterology;  Laterality: N/A;   fibroids scrapping     TONSILLECTOMY      Current Medications: Current Meds  Medication Sig   atorvastatin (LIPITOR) 40 MG tablet TAKE 1 TABLET BY MOUTH DAILY   B-D UF III MINI PEN NEEDLES 31G X 5 MM MISC USE AS DIRECTED   Calcium Carbonate-Vitamin D (CALCIUM 500 + D PO) Take 1 tablet by mouth 2 (two) times daily.   clonazePAM (KLONOPIN) 0.5 MG tablet Take 1 tablet (0.5 mg total) by mouth 2 (two) times daily as needed for anxiety.   clonazePAM (KLONOPIN) 0.5 MG tablet Take 1 tablet (0.5 mg total) by mouth 2 (two) times daily as needed for anxiety.   fenofibrate 160 MG tablet TAKE 1 TABLET BY MOUTH DAILY   hydrocortisone cream 1 % Apply to affected area 2 times daily   insulin degludec (TRESIBA) 100 UNIT/ML FlexTouch Pen Inject 10 Units into the skin daily.   levothyroxine (SYNTHROID) 112 MCG tablet Take 1 tablet (112 mcg total) by mouth daily before breakfast.   lisinopril (ZESTRIL) 5 MG tablet TAKE 1 TABLET BY MOUTH DAILY   Multiple Vitamin (MULTIVITAMIN) tablet Take 1 tablet by mouth daily.   omeprazole (PRILOSEC) 40 MG capsule Take 40 mg by mouth daily.   ONETOUCH ULTRA test strip USE AS DIRECTED UP TO 4 TIMES DAILY   tirzepatide (MOUNJARO) 5 MG/0.5ML Pen Inject 5 mg into the skin once a week.   venlafaxine (EFFEXOR) 37.5 MG tablet Take 1 tablet (37.5 mg total) by mouth daily.   venlafaxine XR (EFFEXOR-XR) 150 MG 24 hr capsule TAKE 1 CAPSULE BY MOUTH DAILY   Vitamin D, Ergocalciferol, (DRISDOL) 1.25 MG (50000 UNIT) CAPS capsule TAKE 1 CAPSULE BY MOUTH EVERY 7  DAYS   [DISCONTINUED] doxycycline (VIBRA-TABS) 100 MG tablet Take 1 tablet (100 mg  total) by mouth 2 (two) times daily.   [DISCONTINUED] meclizine (ANTIVERT) 12.5 MG tablet Take 1 tablet (12.5 mg total) by mouth 3 (three) times daily as needed for dizziness.     Allergies:   Patient has no known allergies.   Social History   Socioeconomic History   Marital status: Divorced    Spouse name: Not on file   Number of children: 0   Years of education: Not on file   Highest education level: Not on file  Occupational History   Occupation: Eps Chartered loss adjuster: [employer]    Employer: [employer]   Occupation: RETIRED  Tobacco Use   Smoking status:  Former    Packs/day: 0.50    Years: 3.00    Total pack years: 1.50    Types: Cigarettes    Quit date: 08/10/1990    Years since quitting: 31.3   Smokeless tobacco: Never  Vaping Use   Vaping Use: Never used  Substance and Sexual Activity   Alcohol use: Yes    Comment: rarely   Drug use: No   Sexual activity: Not Currently    Partners: Male  Other Topics Concern   Not on file  Social History Narrative   Regular exercise - yes-- 10,000 steps   Caffeine use: 2 cups of coffee daily   Live  in [place] with 73 year old father to assist in care. Will be moving in with brother and his wife into larger home to facilitate better care.    Social Determinants of Health   Financial Resource Strain: Low Risk  (05/18/2021)   Overall Financial Resource Strain (CARDIA)    Difficulty of Paying Living Expenses: Not hard at all  Food Insecurity: No Food Insecurity (05/18/2021)   Hunger Vital Sign    Worried About Running Out of Food in the Last Year: Never true    Ran Out of Food in the Last Year: Never true  Transportation Needs: No Transportation Needs (05/18/2021)   PRAPARE - Hydrologist (Medical): No    Lack of Transportation (Non-Medical): No  Physical Activity: Sufficiently Active (05/18/2021)   Exercise Vital Sign    Days of Exercise per Week: 5 days    Minutes of Exercise  per Session: 40 min  Stress: No Stress Concern Present (05/18/2021)   [place]    Feeling of Stress : Not at all  Social Connections: Moderately Integrated (05/18/2021)   Social Connection and Isolation Panel [NHANES]    Frequency of Communication with Friends and Family: More than three times a week    Frequency of Social Gatherings with Friends and Family: More than three times a week    Attends Religious Services: More than 4 times per year    Active Member of [employer] or Organizations: Yes    Attends Music therapist: More than 4 times per year    Marital Status: Never married     Family History: The patient's family history includes Anxiety disorder in her brother; Arthritis in an other family member; Breast cancer in her maternal aunt and paternal grandmother; Cancer in her maternal aunt and maternal aunt; Depression in her mother and another family member; Diabetes in her maternal aunt and mother; Hyperlipidemia in an other family member; Lung cancer in her maternal aunt; Thyroid disease in her mother. There is no history of Colon cancer, Pancreatic cancer, Stomach cancer, Esophageal cancer, or Rectal cancer.  ROS:   Please see the history of present illness.    All other systems reviewed and are negative.  EKGs/Labs/Other Studies Reviewed:    The following studies were reviewed today: I discussed my findings with the patient at length.  Echocardiogram revealed preserved systolic function and mild aortic stenosis.   Recent Labs: 07/28/2021: ALT 20; TSH 0.83 08/12/2021: BUN 22; Creatinine, Ser 0.83; Hemoglobin 12.5; Magnesium 1.6; Platelets 341; Potassium 4.4; Sodium 136  Recent Lipid Panel    Component Value Date/Time   CHOL 120 07/28/2021 1000   CHOL 115 04/14/2018 1019   TRIG 119.0 07/28/2021 1000   HDL 31.10 (L) 07/28/2021 1000   HDL 40 04/14/2018  1019   CHOLHDL 4 07/28/2021 1000   VLDL 23.8  07/28/2021 1000   LDLCALC 65 07/28/2021 1000   LDLCALC 60 04/14/2018 1019   LDLDIRECT 193.3 11/06/2012 0926    Physical Exam:    VS:  BP 136/72   Pulse 90   Ht 5' 5.5" (1.664 m)   Wt 258 lb (117 kg)   SpO2 97%   BMI 42.28 kg/m     Wt Readings from Last 3 Encounters:  11/29/21 258 lb (117 kg)  10/24/21 257 lb 6.4 oz (116.8 kg)  09/11/21 265 lb 9.6 oz (120.5 kg)     GEN: Patient is in no acute distress HEENT: Normal NECK: No JVD; No carotid bruits LYMPHATICS: No lymphadenopathy CARDIAC: Hear sounds regular, 2/6 systolic murmur at the apex. RESPIRATORY:  Clear to auscultation without rales, wheezing or rhonchi  ABDOMEN: Soft, non-tender, non-distended MUSCULOSKELETAL:  No edema; No deformity  SKIN: Warm and dry NEUROLOGIC:  Alert and oriented x 3 PSYCHIATRIC:  Normal affect   Signed, Amy Lindau, MD  11/29/2021 3:26 PM    [facility]

## 2021-11-29 NOTE — Patient Instructions (Signed)

## 2021-12-01 ENCOUNTER — Other Ambulatory Visit: Payer: Self-pay | Admitting: Family Medicine

## 2022-01-02 ENCOUNTER — Encounter: Payer: Self-pay | Admitting: Family Medicine

## 2022-01-02 ENCOUNTER — Ambulatory Visit (INDEPENDENT_AMBULATORY_CARE_PROVIDER_SITE_OTHER): Payer: Medicare Other | Admitting: Family Medicine

## 2022-01-02 VITALS — BP 126/78 | HR 65 | Temp 97.8°F | Resp 12 | Ht 65.0 in | Wt 260.0 lb

## 2022-01-02 DIAGNOSIS — L0291 Cutaneous abscess, unspecified: Secondary | ICD-10-CM

## 2022-01-02 DIAGNOSIS — Z23 Encounter for immunization: Secondary | ICD-10-CM | POA: Diagnosis not present

## 2022-01-02 MED ORDER — DOXYCYCLINE HYCLATE 100 MG PO TABS: 100.0000 mg | ORAL_TABLET | Freq: Two times a day (BID) | ORAL | 0 refills | Status: DC

## 2022-01-02 NOTE — Assessment & Plan Note (Signed)
Culture done Doxycycline sent in  Warm compresses-- -call or rto if no improvement

## 2022-01-02 NOTE — Progress Notes (Signed)
Subjective:   By signing my name below, I, Amy Mahoney, attest that this documentation has been prepared under the direction and in the presence of [name], DO. 01/02/2022     Patient ID: Amy Mahoney, female    DOB: 1951-01-07, 71 y.o.   MRN: [id number]  Chief Complaint  Patient presents with   boils right breast    Two under right breast and 3 starting on top.     HPI Patient is in today for a office visit.   She complains of two boils under her right breast. She is also developing 2 more on the top of her right breast. The two boils on the tops of her right breast are hard and she reports white pus leaked from them after she pooped them. She had developed boils on her breast earlier this year and taken doxycyline which resolved her symptoms.  She is interested in receiving the flu vaccine during this visit.    Past Medical History:  Diagnosis Date   Abnormal esophagram    Abscess of skin 04/15/2015   Anxiety    Back pain    Bacterial vaginal infection 12/17/2014   Bronchitis with bronchospasm 04/14/2013   Chest heaviness 07/11/2020   Class 3 severe obesity with serious comorbidity and body mass index (BMI) of 40.0 to 44.9 in adult [facility]) 06/10/2018   Constipation    Cough 09/17/2013   DEPRESSION 09/05/2006   Qualifier: Diagnosis of  By: Jerold Coombe     Depression with anxiety 07/11/2020   Diabetes mellitus without complication ([place])    DIZZINESS, CHRONIC 05/23/2006   Qualifier: Diagnosis of  By: Jerold Coombe     Dysphagia    FATIGUE 06/01/2009   Qualifier: Diagnosis of  By: Jerold Coombe     Gallbladder problem    Gastroesophageal reflux disease 05/24/2017   Heart murmur    Hyperlipidemia    Hyperlipidemia associated with type 2 diabetes mellitus ([place]) 07/11/2020   Hyperlipidemia LDL goal <100 11/06/2012   Hypothyroidism 05/23/2006   Qualifier: Diagnosis of  By: Jerold Coombe     Lichen planus 96/78/9381   MORBID OBESITY 03/30/2008   Qualifier:  Diagnosis of  By: Jerold Coombe     Muscle spasm of back 11/19/2014   Obstructive sleep apnea 05/23/2006   NPSG 08/03/94 RDI/AHI 49/hr, weight was 285 pounds CPAP/ Advanced    Paronychia of index finger 08/08/2021   Pharyngitis 03/03/2017   Post-nasal drainage 08/03/2019   Last Assessment & Plan:  Formatting of this note might be different from the original. Concern over postnasal drainage. Chronic at least a year history of the above but over the last month seems to have worsened.  She has tried antihistamine therapy without improvement.  Denies obvious heartburn.  Some associated cough and occasional near emesis episodes.  She is not on an ACE inhibitor. EXAM show   Prediabetes    Preventative health care 01/10/2021   Primary hypertension 07/11/2020   Spider bite    Subungual hematoma of fifth toe of right foot 10/29/2017   Swelling    Type 2 diabetes mellitus with hyperglycemia ([place]) 06/10/2018    Past Surgical History:  Procedure Laterality Date   CHOLECYSTECTOMY     DILATION AND CURETTAGE OF UTERUS     ESOPHAGOGASTRODUODENOSCOPY (EGD) WITH PROPOFOL N/A 09/05/2017   Procedure: ESOPHAGOGASTRODUODENOSCOPY (EGD) WITH PROPOFOL;  Surgeon: Jerene Bears, MD;  [facility];  Service: Gastroenterology;  Laterality: N/A;  fibroids scrapping     TONSILLECTOMY      Family History  Problem Relation Age of Onset   Diabetes Mother    Thyroid disease Mother    Depression Mother    Anxiety disorder Brother    Diabetes Maternal Aunt    Lung cancer Maternal Aunt        Nonsmoker   Cancer Maternal Aunt        breast   Breast cancer Maternal Aunt    Cancer Maternal Aunt        lung(nonsmoker)   Breast cancer Paternal Grandmother    Depression Other    Hyperlipidemia Other    Arthritis Other    Colon cancer Neg Hx    Pancreatic cancer Neg Hx    Stomach cancer Neg Hx    Esophageal cancer Neg Hx    Rectal cancer Neg Hx     Social History   Socioeconomic History   Marital  status: Divorced    Spouse name: Not on file   Number of children: 0   Years of education: Not on file   Highest education level: Not on file  Occupational History   Occupation: Eps Chartered loss adjuster: [employer]    Employer: [employer]   Occupation: RETIRED  Tobacco Use   Smoking status: Former    Packs/day: 0.50    Years: 3.00    Total pack years: 1.50    Types: Cigarettes    Quit date: 08/10/1990    Years since quitting: 31.4   Smokeless tobacco: Never  Vaping Use   Vaping Use: Never used  Substance and Sexual Activity   Alcohol use: Yes    Comment: rarely   Drug use: No   Sexual activity: Not Currently    Partners: Male  Other Topics Concern   Not on file  Social History Narrative   Regular exercise - yes-- 10,000 steps   Caffeine use: 2 cups of coffee daily   Live  in [place] with 43 year old father to assist in care. Will be moving in with brother and his wife into larger home to facilitate better care.    Social Determinants of Health   Financial Resource Strain: Low Risk  (05/18/2021)   Overall Financial Resource Strain (CARDIA)    Difficulty of Paying Living Expenses: Not hard at all  Food Insecurity: No Food Insecurity (05/18/2021)   Hunger Vital Sign    Worried About Running Out of Food in the Last Year: Never true    Ran Out of Food in the Last Year: Never true  Transportation Needs: No Transportation Needs (05/18/2021)   PRAPARE - Hydrologist (Medical): No    Lack of Transportation (Non-Medical): No  Physical Activity: Sufficiently Active (05/18/2021)   Exercise Vital Sign    Days of Exercise per Week: 5 days    Minutes of Exercise per Session: 40 min  Stress: No Stress Concern Present (05/18/2021)   [place]    Feeling of Stress : Not at all  Social Connections: Moderately Integrated (05/18/2021)   Social Connection and Isolation Panel  [NHANES]    Frequency of Communication with Friends and Family: More than three times a week    Frequency of Social Gatherings with Friends and Family: More than three times a week    Attends Religious Services: More than 4 times per year    Active Member of Clubs or  Organizations: Yes    Attends Music therapist: More than 4 times per year    Marital Status: Never married  Intimate Partner Violence: Not At Risk (05/18/2021)   Humiliation, Afraid, Rape, and Kick questionnaire    Fear of Current or Ex-Partner: No    Emotionally Abused: No    Physically Abused: No    Sexually Abused: No    Outpatient Medications Prior to Visit  Medication Sig Dispense Refill   atorvastatin (LIPITOR) 40 MG tablet TAKE 1 TABLET BY MOUTH DAILY 90 tablet 3   B-D UF III MINI PEN NEEDLES 31G X 5 MM MISC USE AS DIRECTED 100 each 3   Calcium Carbonate-Vitamin D (CALCIUM 500 + D PO) Take 1 tablet by mouth 2 (two) times daily.     clonazePAM (KLONOPIN) 0.5 MG tablet Take 1 tablet (0.5 mg total) by mouth 2 (two) times daily as needed for anxiety. 20 tablet 1   clonazePAM (KLONOPIN) 0.5 MG tablet Take 1 tablet (0.5 mg total) by mouth 2 (two) times daily as needed for anxiety. 30 tablet 3   fenofibrate 160 MG tablet TAKE 1 TABLET BY MOUTH DAILY 100 tablet 2   hydrocortisone cream 1 % Apply to affected area 2 times daily 15 g 0   insulin degludec (TRESIBA) 100 UNIT/ML FlexTouch Pen Inject 10 Units into the skin daily.     levothyroxine (SYNTHROID) 112 MCG tablet Take 1 tablet (112 mcg total) by mouth daily before breakfast. 90 tablet 1   lisinopril (ZESTRIL) 5 MG tablet TAKE 1 TABLET BY MOUTH DAILY 100 tablet 2   Multiple Vitamin (MULTIVITAMIN) tablet Take 1 tablet by mouth daily.     omeprazole (PRILOSEC) 40 MG capsule Take 40 mg by mouth daily.     ONETOUCH ULTRA test strip USE AS DIRECTED UP TO 4 TIMES DAILY 100 strip 1   tirzepatide (MOUNJARO) 5 MG/0.5ML Pen INJECT 5 MG SUBCUTANEOUSLY WEEKLY 2 mL 2    venlafaxine (EFFEXOR) 37.5 MG tablet Take 1 tablet (37.5 mg total) by mouth daily. 90 tablet 1   venlafaxine XR (EFFEXOR-XR) 150 MG 24 hr capsule TAKE 1 CAPSULE BY MOUTH DAILY 90 capsule 1   Vitamin D, Ergocalciferol, (DRISDOL) 1.25 MG (50000 UNIT) CAPS capsule TAKE 1 CAPSULE BY MOUTH EVERY 7  DAYS 15 capsule 2   No facility-administered medications prior to visit.    No Known Allergies  Review of Systems  Constitutional:  Negative for chills, fever and malaise/fatigue.  HENT:  Negative for congestion and hearing loss.   Eyes:  Negative for discharge.  Respiratory:  Negative for cough, sputum production and shortness of breath.   Cardiovascular:  Negative for chest pain, palpitations and leg swelling.  Gastrointestinal:  Negative for abdominal pain, blood in stool, constipation, diarrhea, heartburn, nausea and vomiting.  Genitourinary:  Negative for dysuria, frequency, hematuria and urgency.  Musculoskeletal:  Negative for back pain, falls and myalgias.  Skin:  Negative for rash.       (+)2 boils under right breast (+)2 boils developing on top of right breast.   Neurological:  Negative for dizziness, sensory change, loss of consciousness, weakness and headaches.  Endo/Heme/Allergies:  Negative for environmental allergies. Does not bruise/bleed easily.  Psychiatric/Behavioral:  Negative for depression and suicidal ideas. The patient is not nervous/anxious and does not have insomnia.        Objective:    Physical Exam Constitutional:      General: She is not in acute distress.    Appearance:  Normal appearance. She is not ill-appearing.  HENT:     Head: Normocephalic and atraumatic.     Right Ear: External ear normal.     Left Ear: External ear normal.  Eyes:     Extraocular Movements: Extraocular movements intact.     Pupils: Pupils are equal, round, and reactive to light.  Cardiovascular:     Rate and Rhythm: Normal rate and regular rhythm.     Heart sounds: Normal heart  sounds. No murmur heard.    No gallop.  Pulmonary:     Effort: Pulmonary effort is normal. No respiratory distress.     Breath sounds: Normal breath sounds. No wheezing or rales.  Skin:    General: Skin is warm and dry.     Comments: Draining abscess under right breast papules on top of left and right breast; not draining, non-tender  Neurological:     Mental Status: She is alert and oriented to person, place, and time.  Psychiatric:        Judgment: Judgment normal.     BP 126/78 (BP Location: Left Arm, Cuff Size: Large)   Pulse 65   Temp 97.8 F (36.6 C) (Oral)   Resp 12   Ht '5\' 5"'$  (1.651 m)   Wt 260 lb (117.9 kg)   SpO2 98%   BMI 43.27 kg/m  Wt Readings from Last 3 Encounters:  01/02/22 260 lb (117.9 kg)  11/29/21 258 lb (117 kg)  10/24/21 257 lb 6.4 oz (116.8 kg)    Diabetic Foot Exam - Simple   No data filed    Lab Results  Component Value Date   WBC 6.6 08/12/2021   HGB 12.5 08/12/2021   HCT 38.4 08/12/2021   PLT 341 08/12/2021   GLUCOSE 170 (H) 08/12/2021   CHOL 120 07/28/2021   TRIG 119.0 07/28/2021   HDL 31.10 (L) 07/28/2021   LDLDIRECT 193.3 11/06/2012   LDLCALC 65 07/28/2021   ALT 20 07/28/2021   AST 27 07/28/2021   NA 136 08/12/2021   K 4.4 08/12/2021   CL 107 08/12/2021   CREATININE 0.83 08/12/2021   BUN 22 08/12/2021   CO2 26 08/12/2021   TSH 0.83 07/28/2021   HGBA1C 7.5 (H) 07/28/2021   MICROALBUR <0.7 07/11/2020    Lab Results  Component Value Date   TSH 0.83 07/28/2021   Lab Results  Component Value Date   WBC 6.6 08/12/2021   HGB 12.5 08/12/2021   HCT 38.4 08/12/2021   MCV 92.3 08/12/2021   PLT 341 08/12/2021   Lab Results  Component Value Date   NA 136 08/12/2021   K 4.4 08/12/2021   CO2 26 08/12/2021   GLUCOSE 170 (H) 08/12/2021   BUN 22 08/12/2021   CREATININE 0.83 08/12/2021   BILITOT 0.5 07/28/2021   ALKPHOS 49 07/28/2021   AST 27 07/28/2021   ALT 20 07/28/2021   PROT 6.3 07/28/2021   ALBUMIN 4.1 07/28/2021    CALCIUM 9.3 08/12/2021   ANIONGAP 3 (L) 08/12/2021   GFR 83.91 07/28/2021   Lab Results  Component Value Date   CHOL 120 07/28/2021   Lab Results  Component Value Date   HDL 31.10 (L) 07/28/2021   Lab Results  Component Value Date   LDLCALC 65 07/28/2021   Lab Results  Component Value Date   TRIG 119.0 07/28/2021   Lab Results  Component Value Date   CHOLHDL 4 07/28/2021   Lab Results  Component Value Date   HGBA1C 7.5 (H)  07/28/2021       Assessment & Plan:   Problem List Items Addressed This Visit       Unprioritized   Abscess - Primary    Culture done Doxycycline sent in  Warm compresses-- -call or rto if no improvement       Relevant Medications   doxycycline (VIBRA-TABS) 100 MG tablet   Other Relevant Orders   WOUND CULTURE   Other Visit Diagnoses     Need for influenza vaccination       Relevant Orders   Flu Vaccine QUAD High Dose(Fluad)        Meds ordered this encounter  Medications   doxycycline (VIBRA-TABS) 100 MG tablet    Sig: Take 1 tablet (100 mg total) by mouth 2 (two) times daily.    Dispense:  20 tablet    Refill:  0    I, [name], DO, personally preformed the services described in this documentation.  All medical record entries made by the scribe were at my direction and in my presence.  I have reviewed the chart and discharge instructions (if applicable) and agree that the record reflects my personal performance and is accurate and complete. 01/02/2022   I,Amy Mahoney,acting as a scribe for [name], DO.,have documented all relevant documentation on the behalf of [name], DO,as directed by  [name], DO while in the presence of [name], DO.   [name], DO

## 2022-01-02 NOTE — Patient Instructions (Signed)
Skin Abscess  A skin abscess is an infected area on or under your skin that contains a collection of pus and other material. An abscess may also be called a furuncle, carbuncle, or boil. An abscess can occur in or on almost any part of your body. Some abscesses break open (rupture) on their own. Most continue to get worse unless they are treated. The infection can spread deeper into the body and eventually into your blood, which can make you feel ill. Treatment usually involves draining the abscess. What are the causes? An abscess occurs when germs, like bacteria, pass through your skin and cause an infection. This may be caused by: A scrape or cut on your skin. A puncture wound through your skin, including a needle injection or insect bite. Blocked oil or sweat glands. Blocked and infected hair follicles. A cyst that forms beneath your skin (sebaceous cyst) and becomes infected. What increases the risk? This condition is more likely to develop in people who: Have a weak body defense system (immune system). Have diabetes. Have dry and irritated skin. Get frequent injections or use illegal IV drugs. Have a foreign body in a wound, such as a splinter. Have problems with their lymph system or veins. What are the signs or symptoms? Symptoms of this condition include: A painful, firm bump under the skin. A bump with pus at the top. This may break through the skin and drain. Other symptoms include: Redness surrounding the abscess site. Warmth. Swelling of the lymph nodes (glands) near the abscess. Tenderness. A sore on the skin. How is this diagnosed? This condition may be diagnosed based on: A physical exam. Your medical history. A sample of pus. This may be used to find out what is causing the infection. Blood tests. Imaging tests, such as an ultrasound, CT scan, or MRI. How is this treated? A small abscess that drains on its own may not need treatment. Treatment for larger abscesses  may include: Moist heat or heat pack applied to the area several times a day. A procedure to drain the abscess (incision and drainage). Antibiotic medicines. For a severe abscess, you may first get antibiotics through an IV and then change to antibiotics by mouth. Follow these instructions at home: Medicines  Take over-the-counter and prescription medicines only as told by your health care provider. If you were prescribed an antibiotic medicine, take it as told by your health care provider. Do not stop taking the antibiotic even if you start to feel better. Abscess care  If you have an abscess that has not drained, apply heat to the affected area. Use the heat source that your health care provider recommends, such as a moist heat pack or a heating pad. Place a towel between your skin and the heat source. Leave the heat on for 20-30 minutes. Remove the heat if your skin turns bright red. This is especially important if you are unable to feel pain, heat, or cold. You may have a greater risk of getting burned. Follow instructions from your health care provider about how to take care of your abscess. Make sure you: Cover the abscess with a bandage (dressing). Change your dressing or gauze as told by your health care provider. Wash your hands with soap and water before you change the dressing or gauze. If soap and water are not available, use hand sanitizer. Check your abscess every day for signs of a worsening infection. Check for: More redness, swelling, or pain. More fluid or blood. Warmth.   More pus or a bad smell. General instructions To avoid spreading the infection: Do not share personal care items, towels, or hot tubs with others. Avoid making skin contact with other people. Keep all follow-up visits as told by your health care provider. This is important. Contact a health care provider if you have: More redness, swelling, or pain around your abscess. More fluid or blood coming from  your abscess. Warm skin around your abscess. More pus or a bad smell coming from your abscess. Muscle aches. Chills or a general ill feeling. Get help right away if you: Have severe pain. See red streaks on your skin spreading away from the abscess. See redness that spreads quickly. Have a fever or chills. Summary A skin abscess is an infected area on or under your skin that contains a collection of pus and other material. A small abscess that drains on its own may not need treatment. Treatment for larger abscesses may include having a procedure to drain the abscess and taking an antibiotic. This information is not intended to replace advice given to you by your health care provider. Make sure you discuss any questions you have with your health care provider. Document Revised: 05/11/2021 Document Reviewed: 11/14/2020 Elsevier Patient Education  2023 Elsevier Inc.  

## 2022-01-03 DIAGNOSIS — L0291 Cutaneous abscess, unspecified: Secondary | ICD-10-CM | POA: Diagnosis not present

## 2022-01-05 ENCOUNTER — Other Ambulatory Visit: Payer: Self-pay | Admitting: Family Medicine

## 2022-01-05 DIAGNOSIS — E038 Other specified hypothyroidism: Secondary | ICD-10-CM

## 2022-01-06 LAB — WOUND CULTURE
MICRO NUMBER:: 14192740
SPECIMEN QUALITY:: ADEQUATE

## 2022-01-08 ENCOUNTER — Encounter: Payer: Self-pay | Admitting: Family Medicine

## 2022-01-15 ENCOUNTER — Encounter: Payer: Medicare Other | Admitting: Family Medicine

## 2022-01-21 ENCOUNTER — Other Ambulatory Visit: Payer: Self-pay | Admitting: Family Medicine

## 2022-01-21 DIAGNOSIS — F321 Major depressive disorder, single episode, moderate: Secondary | ICD-10-CM

## 2022-01-23 NOTE — Progress Notes (Signed)
[facility] Quality Team Note  Name: Amy Mahoney Date of Birth: 05/21/1950 MRN: [id number] Date: 01/23/2022  [facility] Quality Team has reviewed this patient's chart, please see recommendations below:  [facility] Quality Other; (KED GAP- PATIENT HAS eGFR BUT NEEDS URINE MICROALBUMIN/CREATININE RATIO ORDERED AND COMPLETED IN ORDER TO CLOSE GAP FOR 2023)

## 2022-01-26 DIAGNOSIS — G4733 Obstructive sleep apnea (adult) (pediatric): Secondary | ICD-10-CM | POA: Diagnosis not present

## 2022-01-30 ENCOUNTER — Ambulatory Visit (INDEPENDENT_AMBULATORY_CARE_PROVIDER_SITE_OTHER): Payer: Medicare Other | Admitting: Behavioral Health

## 2022-01-30 ENCOUNTER — Encounter: Payer: Self-pay | Admitting: Behavioral Health

## 2022-01-30 DIAGNOSIS — F411 Generalized anxiety disorder: Secondary | ICD-10-CM

## 2022-01-30 DIAGNOSIS — F331 Major depressive disorder, recurrent, moderate: Secondary | ICD-10-CM | POA: Diagnosis not present

## 2022-01-30 DIAGNOSIS — F321 Major depressive disorder, single episode, moderate: Secondary | ICD-10-CM

## 2022-01-30 MED ORDER — VENLAFAXINE HCL ER 150 MG PO CP24: 150.0000 mg | ORAL_CAPSULE | Freq: Every day | ORAL | 3 refills | Status: DC

## 2022-01-30 MED ORDER — VENLAFAXINE HCL 37.5 MG PO TABS: 37.5000 mg | ORAL_TABLET | Freq: Every day | ORAL | 1 refills | Status: DC

## 2022-01-30 NOTE — Progress Notes (Signed)
[facility]  Patient ID: Amy Mahoney,  MRN: [id number]  PCP: [name], DO  Date of Evaluation: 01/30/2022 Time spent:30 minutes  Chief Complaint:  Chief Complaint   Depression; Anxiety; Follow-up; Medication Refill; Patient Education     HISTORY/CURRENT STATUS: HPI  "Amy Mahoney", 71 year old female presents to this office for follow up and medication management.  No changes this visit. Says that she currently feels stable and content. She doe not want to make any additional medication changes at this time.  She says her anxiety level today is 2 of 10 and her depression is 1 of 10.  She is sleeping 7 to 8 hours per night.  She follows up with her PCP and other specialist regularly as needed.  She denies any history of mania, no psychosis denies any history of auditory or visual hallucinations.  Denies SI or HI.  Past psychotropic medications: Effexor Klonopin       Individual Medical History/ Review of Systems: Changes? :No   Allergies: Patient has no known allergies.  Current Medications:  Current Outpatient Medications:    atorvastatin (LIPITOR) 40 MG tablet, TAKE 1 TABLET BY MOUTH DAILY, Disp: 90 tablet, Rfl: 3   B-D UF III MINI PEN NEEDLES 31G X 5 MM MISC, USE AS DIRECTED, Disp: 100 each, Rfl: 3   Calcium Carbonate-Vitamin D (CALCIUM 500 + D PO), Take 1 tablet by mouth 2 (two) times daily., Disp: , Rfl:    clonazePAM (KLONOPIN) 0.5 MG tablet, Take 1 tablet (0.5 mg total) by mouth 2 (two) times daily as needed for anxiety., Disp: 20 tablet, Rfl: 1   clonazePAM (KLONOPIN) 0.5 MG tablet, Take 1 tablet (0.5 mg total) by mouth 2 (two) times daily as needed for anxiety., Disp: 30 tablet, Rfl: 3   doxycycline (VIBRA-TABS) 100 MG tablet, Take 1 tablet (100 mg total) by mouth 2 (two) times daily., Disp: 20 tablet, Rfl: 0   fenofibrate 160 MG tablet, TAKE 1 TABLET BY MOUTH DAILY, Disp: 100 tablet, Rfl: 2   hydrocortisone cream 1 %, Apply to affected area 2  times daily, Disp: 15 g, Rfl: 0   insulin degludec (TRESIBA) 100 UNIT/ML FlexTouch Pen, Inject 10 Units into the skin daily., Disp: , Rfl:    levothyroxine (SYNTHROID) 112 MCG tablet, TAKE 1 TABLET BY MOUTH DAILY  BEFORE BREAKFAST, Disp: 90 tablet, Rfl: 1   lisinopril (ZESTRIL) 5 MG tablet, TAKE 1 TABLET BY MOUTH DAILY, Disp: 100 tablet, Rfl: 2   Multiple Vitamin (MULTIVITAMIN) tablet, Take 1 tablet by mouth daily., Disp: , Rfl:    omeprazole (PRILOSEC) 40 MG capsule, Take 40 mg by mouth daily., Disp: , Rfl:    ONETOUCH ULTRA test strip, USE AS DIRECTED UP TO 4 TIMES DAILY, Disp: 100 strip, Rfl: 1   tirzepatide (MOUNJARO) 5 MG/0.5ML Pen, INJECT 5 MG SUBCUTANEOUSLY WEEKLY, Disp: 2 mL, Rfl: 2   venlafaxine (EFFEXOR) 37.5 MG tablet, Take 1 tablet (37.5 mg total) by mouth daily., Disp: 90 tablet, Rfl: 1   venlafaxine XR (EFFEXOR-XR) 150 MG 24 hr capsule, Take 1 capsule (150 mg total) by mouth daily., Disp: 90 capsule, Rfl: 3   Vitamin D, Ergocalciferol, (DRISDOL) 1.25 MG (50000 UNIT) CAPS capsule, TAKE 1 CAPSULE BY MOUTH EVERY 7  DAYS, Disp: 15 capsule, Rfl: 2 Medication Side Effects: none  Family Medical/ Social History: Changes? No  MENTAL HEALTH EXAM:  There were no vitals taken for this visit.There is no height or weight on file to calculate BMI.  General Appearance: Casual and Neat  Eye Contact:  Good  Speech:  Clear and Coherent  Volume:  Normal  Mood:  NA  Affect:  Appropriate  Thought Process:  Coherent  Orientation:  Full (Time, Place, and Person)  Thought Content: Logical   Suicidal Thoughts:  No  Homicidal Thoughts:  No  Memory:  WNL  Judgement:  Good  Insight:  Good  Psychomotor Activity:  Normal  Concentration:  Concentration: Good  Recall:  Good  Fund of Knowledge: Good  Language: Good  Assets:  Desire for Improvement  ADL's:  Intact  Cognition: WNL  Prognosis:  Good    DIAGNOSES:    ICD-10-CM   1. Depression, major, single episode, moderate (HCC)  F32.1  venlafaxine XR (EFFEXOR-XR) 150 MG 24 hr capsule    2. Major depressive disorder, recurrent episode, moderate (HCC)  F33.1 venlafaxine (EFFEXOR) 37.5 MG tablet    3. Generalized anxiety disorder  F41.1 venlafaxine (EFFEXOR) 37.5 MG tablet      Receiving Psychotherapy: No    RECOMMENDATIONS:   Greater than 50% of 30  min face to face time with patient was spent on counseling and coordination of care. No changes this visit.  Discussed her current level of stability. She is very happy with her medications right now and does not want to make any changes at this time.  We agreed to:  To continue Effexor to 187.5 mg daily.  Patient will have to continue 150 mg ER Capsule combined with a 37.5 mg capsule daily. Two separate RX. To continue Klonopin 0.5 mg once daily for severe anxiety Will report worsening symptoms or side effects promptly Provided emergency contact information Will follow-up in 3 months to reassess Discussed potential benefits, risk, and side effects of benzodiazepines to include potential risk of tolerance and dependence, as well as possible drowsiness.  Advised patient not to drive if experiencing drowsiness and to take lowest possible effective dose to minimize risk of dependence and tolerance.  Reviewed PDMP    Elwanda [place], NP

## 2022-02-02 ENCOUNTER — Ambulatory Visit (INDEPENDENT_AMBULATORY_CARE_PROVIDER_SITE_OTHER): Payer: Medicare Other | Admitting: Family Medicine

## 2022-02-02 ENCOUNTER — Encounter: Payer: Self-pay | Admitting: Family Medicine

## 2022-02-02 VITALS — BP 122/88 | HR 77 | Temp 98.4°F | Resp 18 | Ht 65.0 in | Wt 259.8 lb

## 2022-02-02 DIAGNOSIS — Z0000 Encounter for general adult medical examination without abnormal findings: Secondary | ICD-10-CM | POA: Diagnosis not present

## 2022-02-02 DIAGNOSIS — E785 Hyperlipidemia, unspecified: Secondary | ICD-10-CM

## 2022-02-02 DIAGNOSIS — I10 Essential (primary) hypertension: Secondary | ICD-10-CM | POA: Diagnosis not present

## 2022-02-02 DIAGNOSIS — E1169 Type 2 diabetes mellitus with other specified complication: Secondary | ICD-10-CM | POA: Diagnosis not present

## 2022-02-02 DIAGNOSIS — E559 Vitamin D deficiency, unspecified: Secondary | ICD-10-CM

## 2022-02-02 DIAGNOSIS — E1165 Type 2 diabetes mellitus with hyperglycemia: Secondary | ICD-10-CM | POA: Diagnosis not present

## 2022-02-02 LAB — COMPREHENSIVE METABOLIC PANEL
ALT: 23 U/L (ref 0–35)
AST: 33 U/L (ref 0–37)
Albumin: 4.4 g/dL (ref 3.5–5.2)
Alkaline Phosphatase: 45 U/L (ref 39–117)
BUN: 21 mg/dL (ref 6–23)
CO2: 29 mEq/L (ref 19–32)
Calcium: 9.8 mg/dL (ref 8.4–10.5)
Chloride: 106 mEq/L (ref 96–112)
Creatinine, Ser: 0.79 mg/dL (ref 0.40–1.20)
GFR: 74.8 mL/min (ref >60.00)
Glucose, Bld: 103 mg/dL — ABNORMAL HIGH (ref 70–99)
Potassium: 5.3 mEq/L — ABNORMAL HIGH (ref 3.5–5.1)
Sodium: 141 mEq/L (ref 135–145)
Total Bilirubin: 0.5 mg/dL (ref 0.2–1.2)
Total Protein: 6.7 g/dL (ref 6.0–8.3)

## 2022-02-02 LAB — CBC WITH DIFFERENTIAL/PLATELET
Basophils Absolute: 0.1 10*3/uL (ref 0.0–0.1)
Basophils Relative: 0.9 % (ref 0.0–3.0)
Eosinophils Absolute: 0.4 10*3/uL (ref 0.0–0.7)
Eosinophils Relative: 5.5 % — ABNORMAL HIGH (ref 0.0–5.0)
HCT: 38.5 % (ref 36.0–46.0)
Hemoglobin: 12.9 g/dL (ref 12.0–15.0)
Lymphocytes Relative: 31.7 % (ref 12.0–46.0)
Lymphs Abs: 2.2 10*3/uL (ref 0.7–4.0)
MCHC: 33.5 g/dL (ref 30.0–36.0)
MCV: 92.1 fl (ref 78.0–100.0)
Monocytes Absolute: 0.5 10*3/uL (ref 0.1–1.0)
Monocytes Relative: 6.9 % (ref 3.0–12.0)
Neutro Abs: 3.8 10*3/uL (ref 1.4–7.7)
Neutrophils Relative %: 55 % (ref 43.0–77.0)
Platelets: 308 10*3/uL (ref 150.0–400.0)
RBC: 4.18 Mil/uL (ref 3.87–5.11)
RDW: 13.9 % (ref 11.5–15.5)
WBC: 6.9 10*3/uL (ref 4.0–10.5)

## 2022-02-02 LAB — MICROALBUMIN / CREATININE URINE RATIO
Creatinine,U: 53.9 mg/dL
Microalb Creat Ratio: 3.1 mg/g (ref 0.0–30.0)
Microalb, Ur: 1.7 mg/dL (ref 0.0–1.9)

## 2022-02-02 LAB — LIPID PANEL
Cholesterol: 124 mg/dL (ref 0–200)
HDL: 37.8 mg/dL — ABNORMAL LOW (ref >39.00)
LDL Cholesterol: 64 mg/dL (ref 0–99)
NonHDL: 86.58
Total CHOL/HDL Ratio: 3
Triglycerides: 111 mg/dL (ref 0.0–149.0)
VLDL: 22.2 mg/dL (ref 0.0–40.0)

## 2022-02-02 LAB — HEMOGLOBIN A1C: Hgb A1c MFr Bld: 7.2 % — ABNORMAL HIGH (ref 4.6–6.5)

## 2022-02-02 LAB — VITAMIN D 25 HYDROXY (VIT D DEFICIENCY, FRACTURES): VITD: 55.56 ng/mL (ref 30.00–100.00)

## 2022-02-02 MED ORDER — TIRZEPATIDE 7.5 MG/0.5ML ~~LOC~~ SOAJ: 7.5000 mg | SUBCUTANEOUS | 3 refills | Status: DC

## 2022-02-02 MED ORDER — MOXIFLOXACIN HCL 0.5 % OP SOLN: 1.0000 [drp] | Freq: Three times a day (TID) | OPHTHALMIC | 0 refills | Status: DC

## 2022-02-02 NOTE — Progress Notes (Unsigned)
Subjective:     Amy Mahoney is a 71 y.o. female and is here for a comprehensive physical exam. The patient reports no problems.  Social History   Socioeconomic History   Marital status: Divorced    Spouse name: Not on file   Number of children: 0   Years of education: Not on file   Highest education level: Not on file  Occupational History   Occupation: Eps Chartered loss adjuster: [employer]    Employer: [employer]   Occupation: RETIRED  Tobacco Use   Smoking status: Former    Packs/day: 0.50    Years: 3.00    Total pack years: 1.50    Types: Cigarettes    Quit date: 08/10/1990    Years since quitting: 31.5   Smokeless tobacco: Never  Vaping Use   Vaping Use: Never used  Substance and Sexual Activity   Alcohol use: Yes    Comment: rarely   Drug use: No   Sexual activity: Not Currently    Partners: Male  Other Topics Concern   Not on file  Social History Narrative   Regular exercise - yes-- 10,000 steps   Caffeine use: 2 cups of coffee daily   Live  in [place] with 64 year old father to assist in care. Will be moving in with brother and his wife into larger home to facilitate better care.    Social Determinants of Health   Financial Resource Strain: Low Risk  (05/18/2021)   Overall Financial Resource Strain (CARDIA)    Difficulty of Paying Living Expenses: Not hard at all  Food Insecurity: No Food Insecurity (05/18/2021)   Hunger Vital Sign    Worried About Running Out of Food in the Last Year: Never true    Ran Out of Food in the Last Year: Never true  Transportation Needs: No Transportation Needs (05/18/2021)   PRAPARE - Hydrologist (Medical): No    Lack of Transportation (Non-Medical): No  Physical Activity: Sufficiently Active (05/18/2021)   Exercise Vital Sign    Days of Exercise per Week: 5 days    Minutes of Exercise per Session: 40 min  Stress: No Stress Concern Present (05/18/2021)   [place]    Feeling of Stress : Not at all  Social Connections: Moderately Integrated (05/18/2021)   Social Connection and Isolation Panel [NHANES]    Frequency of Communication with Friends and Family: More than three times a week    Frequency of Social Gatherings with Friends and Family: More than three times a week    Attends Religious Services: More than 4 times per year    Active Member of Clubs or Organizations: Yes    Attends Archivist Meetings: More than 4 times per year    Marital Status: Never married  Intimate Partner Violence: Not At Risk (05/18/2021)   Humiliation, Afraid, Rape, and Kick questionnaire    Fear of Current or Ex-Partner: No    Emotionally Abused: No    Physically Abused: No    Sexually Abused: No   Health Maintenance  Topic Date Due   COVID-19 Vaccine (1) Never done   Diabetic kidney evaluation - Urine ACR  07/11/2021   FOOT EXAM  07/11/2021   HEMOGLOBIN A1C  01/27/2022   MAMMOGRAM  04/27/2022   Medicare Annual Wellness (AWV)  05/19/2022   Diabetic kidney evaluation - eGFR measurement  08/13/2022   OPHTHALMOLOGY  EXAM  10/20/2022   COLONOSCOPY (Pts 45-9yr Insurance coverage will need to be confirmed)  01/22/2028   DTaP/Tdap/Td (3 - Td or Tdap) 08/09/2031   Pneumonia Vaccine 71 Years old  Completed   INFLUENZA VACCINE  Completed   DEXA SCAN  Completed   Hepatitis C Screening  Completed   Zoster Vaccines- Shingrix  Completed   HPV VACCINES  Aged Out    The following portions of the patient's history were reviewed and updated as appropriate: She  has a past medical history of Abnormal esophagram, Abscess of skin (04/15/2015), Anxiety, Back pain, Bacterial vaginal infection (12/17/2014), Bronchitis with bronchospasm (04/14/2013), Chest heaviness (07/11/2020), Class 3 severe obesity with serious comorbidity and body mass index (BMI) of 40.0 to 44.9 in adult ([facility] (06/10/2018), Constipation, Cough  (09/17/2013), DEPRESSION (09/05/2006), Depression with anxiety (07/11/2020), Diabetes mellitus without complication ([place] [place], DIZZINESS, CHRONIC (05/23/2006), Dysphagia, FATIGUE (06/01/2009), Gallbladder problem, Gastroesophageal reflux disease (05/24/2017), Heart murmur, Hyperlipidemia, Hyperlipidemia associated with type 2 diabetes mellitus ([facility] (07/11/2020), Hyperlipidemia LDL goal <100 (11/06/2012), Hypothyroidism (44/0/8144, Lichen planus (181/85/6314, MORBID OBESITY (03/30/2008), Muscle spasm of back (11/19/2014), Obstructive sleep apnea (05/23/2006), Paronychia of index finger (08/08/2021), Pharyngitis (03/03/2017), Post-nasal drainage (08/03/2019), Prediabetes, Preventative health care (01/10/2021), Primary hypertension (07/11/2020), Spider bite, Subungual hematoma of fifth toe of right foot (10/29/2017), Swelling, and Type 2 diabetes mellitus with hyperglycemia ([facility] [place] (06/10/2018). She does not have any pertinent problems on file. She  has a past surgical history that includes Cholecystectomy; Dilation and curettage of uterus; Tonsillectomy; fibroids scrapping; and Esophagogastroduodenoscopy (egd) with propofol (N/A, 09/05/2017). Her family history includes Anxiety disorder in her brother; Arthritis in an other family member; Breast cancer in her maternal aunt and paternal grandmother; Cancer in her maternal aunt and maternal aunt; Depression in her mother and another family member; Diabetes in her maternal aunt and mother; Hyperlipidemia in an other family member; Lung cancer in her maternal aunt; Thyroid disease in her mother. She  reports that she quit smoking about 31 years ago. Her smoking use included cigarettes. She has a 1.50 pack-year smoking history. She has never used smokeless tobacco. She reports current alcohol use. She reports that she does not use drugs. She has a current medication list which includes the following prescription(s): atorvastatin, b-d uf iii mini pen needles, calcium carb-cholecalciferol,  clonazepam, clonazepam, fenofibrate, hydrocortisone cream, insulin degludec, levothyroxine, lisinopril, multivitamin, omeprazole, onetouch ultra, mounjaro, venlafaxine, venlafaxine xr, and vitamin d (ergocalciferol). Current Outpatient Medications on File Prior to Visit  Medication Sig Dispense Refill   atorvastatin (LIPITOR) 40 MG tablet TAKE 1 TABLET BY MOUTH DAILY 90 tablet 3   B-D UF III MINI PEN NEEDLES 31G X 5 MM MISC USE AS DIRECTED 100 each 3   Calcium Carbonate-Vitamin D (CALCIUM 500 + D PO) Take 1 tablet by mouth 2 (two) times daily.     clonazePAM (KLONOPIN) 0.5 MG tablet Take 1 tablet (0.5 mg total) by mouth 2 (two) times daily as needed for anxiety. 20 tablet 1   clonazePAM (KLONOPIN) 0.5 MG tablet Take 1 tablet (0.5 mg total) by mouth 2 (two) times daily as needed for anxiety. 30 tablet 3   fenofibrate 160 MG tablet TAKE 1 TABLET BY MOUTH DAILY 100 tablet 2   hydrocortisone cream 1 % Apply to affected area 2 times daily 15 g 0   insulin degludec (TRESIBA) 100 UNIT/ML FlexTouch Pen Inject 10 Units into the skin daily.     levothyroxine (SYNTHROID) 112 MCG tablet TAKE 1 TABLET BY MOUTH DAILY  BEFORE BREAKFAST  90 tablet 1   lisinopril (ZESTRIL) 5 MG tablet TAKE 1 TABLET BY MOUTH DAILY 100 tablet 2   Multiple Vitamin (MULTIVITAMIN) tablet Take 1 tablet by mouth daily.     omeprazole (PRILOSEC) 40 MG capsule Take 40 mg by mouth daily.     ONETOUCH ULTRA test strip USE AS DIRECTED UP TO 4 TIMES DAILY 100 strip 1   tirzepatide (MOUNJARO) 5 MG/0.5ML Pen INJECT 5 MG SUBCUTANEOUSLY WEEKLY 2 mL 2   venlafaxine (EFFEXOR) 37.5 MG tablet Take 1 tablet (37.5 mg total) by mouth daily. 90 tablet 1   venlafaxine XR (EFFEXOR-XR) 150 MG 24 hr capsule Take 1 capsule (150 mg total) by mouth daily. 90 capsule 3   Vitamin D, Ergocalciferol, (DRISDOL) 1.25 MG (50000 UNIT) CAPS capsule TAKE 1 CAPSULE BY MOUTH EVERY 7  DAYS 15 capsule 2   No current facility-administered medications on file prior to visit.    She has No Known Allergies..  Review of Systems   Review of Systems  Constitutional: Negative for activity change, appetite change and fatigue.  HENT: Negative for hearing loss, congestion, tinnitus and ear discharge.  dentist q31mEyes: Negative for visual disturbance (see optho q1y -- vision corrected to 20/20 with glasses).  Respiratory: Negative for cough, chest tightness and shortness of breath.   Cardiovascular: Negative for chest pain, palpitations and leg swelling.  Gastrointestinal: Negative for abdominal pain, diarrhea, constipation and abdominal distention.  Genitourinary: Negative for urgency, frequency, decreased urine volume and difficulty urinating.  Musculoskeletal: Negative for back pain, arthralgias and gait problem.  Skin: Negative for color change, pallor and rash.  Neurological: Negative for dizziness, light-headedness, numbness and headaches.  Hematological: Negative for adenopathy. Does not bruise/bleed easily.  Psychiatric/Behavioral: Negative for suicidal ideas, confusion, sleep disturbance, self-injury, dysphoric mood, decreased concentration and agitation.       BP 122/88 (BP Location: Left Arm, Patient Position: Sitting, Cuff Size: Large)   Pulse 77   Temp 98.4 F (36.9 C) (Oral)   Resp 18   Ht _0  (1.651 m)   Wt 259 lb 12.8 oz (117.8 kg)   SpO2 99%   BMI 43.23 kg/m  General appearance: alert, cooperative, appears stated age, no distress, mild distress, moderate distress, severe distress, mildly obese, and moderately obese Head: Normocephalic, without obvious abnormality, atraumatic Eyes: conjunctivae/corneas clear. PERRL, EOM's intact. Fundi benign. Ears: normal TM's and external ear canals both ears Nose: Nares normal. Septum midline. Mucosa normal. No drainage or sinus tenderness. Throat: lips, mucosa, and tongue normal; teeth and gums normal Neck: no adenopathy, no carotid bruit, no JVD, supple, symmetrical, trachea midline, and thyroid not  enlarged, symmetric, no tenderness/mass/nodules Back: symmetric, no curvature. ROM normal. No CVA tenderness. Lungs: clear to auscultation bilaterally Heart: regular rate and rhythm, S1, S2 normal, no murmur, click, rub or gallop Abdomen: soft, non-tender; bowel sounds normal; no masses,  no organomegaly Extremities: extremities normal, atraumatic, no cyanosis or edema Pulses: 2+ and symmetric Skin: Skin color, texture, turgor normal. No rashes or lesions Lymph nodes: Cervical, supraclavicular, and axillary nodes normal. Neurologic: Alert and oriented X 3, normal strength and tone. Normal symmetric reflexes. Normal coordination and gait    Assessment:    Healthy female exam.      Plan:    Ghm utd Check labs  See After Visit Summary for Counseling Recommendations   1. Type 2 diabetes mellitus with hyperglycemia, unspecified whether long term insulin use (HCC) hgba1c to be checked , minimize simple carbs. Increase exercise as tolerated.  Continue current meds  - Lipid panel - CBC with Differential/Platelet - Comprehensive metabolic panel - Hemoglobin A1c - Microalbumin / creatinine urine ratio - tirzepatide (MOUNJARO) 7.5 MG/0.5ML Pen; Inject 7.5 mg into the skin once a week.  Dispense: 6 mL; Refill: 3  2. Primary hypertension Well controlled, no changes to meds. Encouraged heart healthy diet such as the DASH diet and exercise as tolerated.   - Lipid panel - CBC with Differential/Platelet - Comprehensive metabolic panel - Hemoglobin A1c - Microalbumin / creatinine urine ratio  3. Hyperlipidemia associated with type 2 diabetes mellitus (HCC) Encourage heart healthy diet such as MIND or DASH diet, increase exercise, avoid trans fats, simple carbohydrates and processed foods, consider a krill or fish or flaxseed oil cap daily.   - Lipid panel - CBC with Differential/Platelet - Comprehensive metabolic panel - Hemoglobin A1c - Microalbumin / creatinine urine ratio  4.  Preventative health care See above   5. Vitamin D deficiency  - VITAMIN D 25 Hydroxy (Vit-D Deficiency, Fractures)  6. Morbid obesity ([place]) Pt working on diet and exercise

## 2022-02-03 ENCOUNTER — Other Ambulatory Visit: Payer: Self-pay | Admitting: Family Medicine

## 2022-02-03 DIAGNOSIS — E1165 Type 2 diabetes mellitus with hyperglycemia: Secondary | ICD-10-CM

## 2022-02-03 DIAGNOSIS — I10 Essential (primary) hypertension: Secondary | ICD-10-CM

## 2022-02-03 DIAGNOSIS — E1169 Type 2 diabetes mellitus with other specified complication: Secondary | ICD-10-CM

## 2022-02-05 ENCOUNTER — Other Ambulatory Visit: Payer: Self-pay

## 2022-02-05 MED ORDER — INSULIN DEGLUDEC 100 UNIT/ML ~~LOC~~ SOPN: 10.0000 [IU] | PEN_INJECTOR | Freq: Every day | SUBCUTANEOUS | 2 refills | Status: DC

## 2022-02-28 ENCOUNTER — Other Ambulatory Visit: Payer: Self-pay | Admitting: Family Medicine

## 2022-03-15 ENCOUNTER — Other Ambulatory Visit: Payer: Self-pay | Admitting: Family Medicine

## 2022-03-15 DIAGNOSIS — E038 Other specified hypothyroidism: Secondary | ICD-10-CM

## 2022-03-20 ENCOUNTER — Other Ambulatory Visit: Payer: Self-pay | Admitting: Family Medicine

## 2022-04-27 ENCOUNTER — Other Ambulatory Visit: Payer: Self-pay | Admitting: Behavioral Health

## 2022-04-27 DIAGNOSIS — F411 Generalized anxiety disorder: Secondary | ICD-10-CM

## 2022-04-27 DIAGNOSIS — F331 Major depressive disorder, recurrent, moderate: Secondary | ICD-10-CM

## 2022-04-27 NOTE — Telephone Encounter (Signed)
Due 3/12

## 2022-05-02 ENCOUNTER — Ambulatory Visit: Payer: Medicare Other | Admitting: Behavioral Health

## 2022-05-02 ENCOUNTER — Encounter: Payer: Self-pay | Admitting: Behavioral Health

## 2022-05-02 DIAGNOSIS — F411 Generalized anxiety disorder: Secondary | ICD-10-CM | POA: Diagnosis not present

## 2022-05-02 DIAGNOSIS — F330 Major depressive disorder, recurrent, mild: Secondary | ICD-10-CM

## 2022-05-02 NOTE — Progress Notes (Signed)
[facility]  Patient ID: Amy Mahoney,  MRN: [id number]  PCP: Amy Held, DO  Date of Evaluation: 05/02/2022 Time spent:20 minutes  Chief Complaint:  Chief Complaint   Anxiety; Follow-up; Patient Education; Depression     HISTORY/CURRENT STATUS: HPI "Amy Mahoney", 72 year old female presents to this office for follow up and medication management.  No changes this visit. Says that she currently feels stable and content. She doe not want to make any additional medication changes at this time.  She says her anxiety level today is 1 of 10 and her depression is 1 of 10.  She is sleeping 7 to 8 hours per night.  She follows up with her PCP and other specialist regularly as needed.  She denies any history of mania, no psychosis denies any history of auditory or visual hallucinations.  Denies SI or HI.  Past psychotropic medications: Effexor Klonopin        Individual Medical History/ Review of Systems: Changes? :No   Allergies: Patient has no known allergies.  Current Medications:  Current Outpatient Medications:    atorvastatin (LIPITOR) 40 MG tablet, TAKE 1 TABLET BY MOUTH DAILY, Disp: 90 tablet, Rfl: 3   B-D UF III MINI PEN NEEDLES 31G X 5 MM MISC, USE AS DIRECTED, Disp: 100 each, Rfl: 3   Calcium Carbonate-Vitamin D (CALCIUM 500 + D PO), Take 1 tablet by mouth 2 (two) times daily., Disp: , Rfl:    clonazePAM (KLONOPIN) 0.5 MG tablet, Take 1 tablet (0.5 mg total) by mouth 2 (two) times daily as needed for anxiety., Disp: 20 tablet, Rfl: 1   clonazePAM (KLONOPIN) 0.5 MG tablet, TAKE 1 TABLET BY MOUTH 2 TIMES DAILY AS NEEDED FOR ANXIETY., Disp: 30 tablet, Rfl: 3   fenofibrate 160 MG tablet, TAKE 1 TABLET BY MOUTH DAILY, Disp: 100 tablet, Rfl: 2   hydrocortisone cream 1 %, Apply to affected area 2 times daily, Disp: 15 g, Rfl: 0   levothyroxine (SYNTHROID) 112 MCG tablet, TAKE 1 TABLET BY MOUTH DAILY  BEFORE BREAKFAST, Disp: 100 tablet, Rfl: 2   lisinopril  (ZESTRIL) 5 MG tablet, TAKE 1 TABLET BY MOUTH DAILY, Disp: 100 tablet, Rfl: 2   moxifloxacin (VIGAMOX) 0.5 % ophthalmic solution, Place 1 drop into the left eye 3 (three) times daily., Disp: 3 mL, Rfl: 0   Multiple Vitamin (MULTIVITAMIN) tablet, Take 1 tablet by mouth daily., Disp: , Rfl:    omeprazole (PRILOSEC) 40 MG capsule, Take 40 mg by mouth daily., Disp: , Rfl:    ONETOUCH ULTRA test strip, USE AS DIRECTED UP TO 4 TIMES DAILY, Disp: 100 strip, Rfl: 1   tirzepatide (MOUNJARO) 7.5 MG/0.5ML Pen, Inject 7.5 mg into the skin once a week., Disp: 6 mL, Rfl: 3   TRESIBA FLEXTOUCH 100 UNIT/ML FlexTouch Pen, INJECT SUBCUTANEOUSLY 10 UNITS  DAILY, Disp: 15 mL, Rfl: 2   venlafaxine (EFFEXOR) 37.5 MG tablet, Take 1 tablet (37.5 mg total) by mouth daily., Disp: 90 tablet, Rfl: 1   venlafaxine XR (EFFEXOR-XR) 150 MG 24 hr capsule, Take 1 capsule (150 mg total) by mouth daily., Disp: 90 capsule, Rfl: 3   Vitamin D, Ergocalciferol, (DRISDOL) 1.25 MG (50000 UNIT) CAPS capsule, TAKE 1 CAPSULE BY MOUTH EVERY 7  DAYS, Disp: 15 capsule, Rfl: 2 Medication Side Effects: none  Family Medical/ Social History: Changes? No  MENTAL HEALTH EXAM:  There were no vitals taken for this visit.There is no height or weight on file to calculate BMI.  General Appearance: Casual  Eye Contact:  Good  Speech:  Clear and Coherent  Volume:  Normal  Mood:  NA  Affect:  Appropriate  Thought Process:  Coherent  Orientation:  Full (Time, Place, and Person)  Thought Content: Logical   Suicidal Thoughts:  No  Homicidal Thoughts:  No  Memory:  WNL  Judgement:  Good  Insight:  Good  Psychomotor Activity:  Normal  Concentration:  Concentration: Good  Recall:  Good  Fund of Knowledge: Good  Language: Good  Assets:  Desire for Improvement  ADL's:  Intact  Cognition: WNL  Prognosis:  Good    DIAGNOSES: No diagnosis found.  Receiving Psychotherapy: No    RECOMMENDATIONS:   Greater than 50% of 20  min face to face  time with patient was spent on counseling and coordination of care. No changes this visit.  Discussed her current level of stability. She is very happy with her medications right now and does not want to make any changes at this time.  We agreed to:   To continue Effexor to 187.5 mg daily.  Patient will have to continue 150 mg ER Capsule combined with a 37.5 mg capsule daily. Two separate RX. To continue Klonopin 0.5 mg once daily for severe anxiety Will report worsening symptoms or side effects promptly Provided emergency contact information Will follow-up in 3 months to reassess Discussed potential benefits, risk, and side effects of benzodiazepines to include potential risk of tolerance and dependence, as well as possible drowsiness.  Advised patient not to drive if experiencing drowsiness and to take lowest possible effective dose to minimize risk of dependence and tolerance.  Reviewed PDMP  Elwanda Brooklyn, NP

## 2022-05-12 ENCOUNTER — Other Ambulatory Visit: Payer: Self-pay | Admitting: Family Medicine

## 2022-05-12 DIAGNOSIS — E1165 Type 2 diabetes mellitus with hyperglycemia: Secondary | ICD-10-CM

## 2022-05-21 ENCOUNTER — Other Ambulatory Visit (HOSPITAL_BASED_OUTPATIENT_CLINIC_OR_DEPARTMENT_OTHER): Payer: Self-pay | Admitting: Family Medicine

## 2022-05-21 DIAGNOSIS — Z1231 Encounter for screening mammogram for malignant neoplasm of breast: Secondary | ICD-10-CM

## 2022-05-22 ENCOUNTER — Ambulatory Visit (INDEPENDENT_AMBULATORY_CARE_PROVIDER_SITE_OTHER): Payer: Medicare Other | Admitting: *Deleted

## 2022-05-22 DIAGNOSIS — Z0000 Encounter for general adult medical examination without abnormal findings: Secondary | ICD-10-CM | POA: Diagnosis not present

## 2022-05-22 DIAGNOSIS — Z780 Asymptomatic menopausal state: Secondary | ICD-10-CM | POA: Diagnosis not present

## 2022-05-22 NOTE — Patient Instructions (Signed)
Amy Mahoney , Thank you for taking time to come for your Medicare Wellness Visit. I appreciate your ongoing commitment to your health goals. Please review the following plan we discussed and let me know if I can assist you in the future.     This is a list of the screening recommended for you and due dates:  Health Maintenance  Topic Date Due   COVID-19 Vaccine (1) Never done   Complete foot exam   07/11/2021   Mammogram  04/27/2022   Hemoglobin A1C  08/04/2022   Flu Shot  09/20/2022   Eye exam for diabetics  10/20/2022   Yearly kidney function blood test for diabetes  02/03/2023   Yearly kidney health urinalysis for diabetes  02/03/2023   Medicare Annual Wellness Visit  05/22/2023   Colon Cancer Screening  01/22/2028   DTaP/Tdap/Td vaccine (3 - Td or Tdap) 08/09/2031   Pneumonia Vaccine  Completed   DEXA scan (bone density measurement)  Completed   Hepatitis C Screening: USPSTF Recommendation to screen - Ages 21-79 yo.  Completed   Zoster (Shingles) Vaccine  Completed   HPV Vaccine  Aged Out    Next appointment: Follow up in one year for your annual wellness visit.   Preventive Care 1 Years and Older, Female Preventive care refers to lifestyle choices and visits with your health care provider that can promote health and wellness. What does preventive care include? A yearly physical exam. This is also called an annual well check. Dental exams once or twice a year. Routine eye exams. Ask your health care provider how often you should have your eyes checked. Personal lifestyle choices, including: Daily care of your teeth and gums. Regular physical activity. Eating a healthy diet. Avoiding tobacco and drug use. Limiting alcohol use. Practicing safe sex. Taking low-dose aspirin every day. Taking vitamin and mineral supplements as recommended by your health care provider. What happens during an annual well check? The services and screenings done by your health care provider  during your annual well check will depend on your age, overall health, lifestyle risk factors, and family history of disease. Counseling  Your health care provider may ask you questions about your: Alcohol use. Tobacco use. Drug use. Emotional well-being. Home and relationship well-being. Sexual activity. Eating habits. History of falls. Memory and ability to understand (cognition). Work and work Statistician. Reproductive health. Screening  You may have the following tests or measurements: Height, weight, and BMI. Blood pressure. Lipid and cholesterol levels. These may be checked every 5 years, or more frequently if you are over 19 years old. Skin check. Lung cancer screening. You may have this screening every year starting at age 22 if you have a 30-pack-year history of smoking and currently smoke or have quit within the past 15 years. Fecal occult blood test (FOBT) of the stool. You may have this test every year starting at age 72. Flexible sigmoidoscopy or colonoscopy. You may have a sigmoidoscopy every 5 years or a colonoscopy every 10 years starting at age 11. Hepatitis C blood test. Hepatitis B blood test. Sexually transmitted disease (STD) testing. Diabetes screening. This is done by checking your blood sugar (glucose) after you have not eaten for a while (fasting). You may have this done every 1-3 years. Bone density scan. This is done to screen for osteoporosis. You may have this done starting at age 93. Mammogram. This may be done every 1-2 years. Talk to your health care provider about how often you should have  regular mammograms. Talk with your health care provider about your test results, treatment options, and if necessary, the need for more tests. Vaccines  Your health care provider may recommend certain vaccines, such as: Influenza vaccine. This is recommended every year. Tetanus, diphtheria, and acellular pertussis (Tdap, Td) vaccine. You may need a Td booster every  10 years. Zoster vaccine. You may need this after age 31. Pneumococcal 13-valent conjugate (PCV13) vaccine. One dose is recommended after age 35. Pneumococcal polysaccharide (PPSV23) vaccine. One dose is recommended after age 29. Talk to your health care provider about which screenings and vaccines you need and how often you need them. This information is not intended to replace advice given to you by your health care provider. Make sure you discuss any questions you have with your health care provider. Document Released: 03/04/2015 Document Revised: 10/26/2015 Document Reviewed: 12/07/2014 Elsevier Interactive Patient Education  2017 [employer] Prevention in the Home Falls can cause injuries. They can happen to people of all ages. There are many things you can do to make your home safe and to help prevent falls. What can I do on the outside of my home? Regularly fix the edges of walkways and driveways and fix any cracks. Remove anything that might make you trip as you walk through a door, such as a raised step or threshold. Trim any bushes or trees on the path to your home. Use bright outdoor lighting. Clear any walking paths of anything that might make someone trip, such as rocks or tools. Regularly check to see if handrails are loose or broken. Make sure that both sides of any steps have handrails. Any raised decks and porches should have guardrails on the edges. Have any leaves, snow, or ice cleared regularly. Use sand or salt on walking paths during winter. Clean up any spills in your garage right away. This includes oil or grease spills. What can I do in the bathroom? Use night lights. Install grab bars by the toilet and in the tub and shower. Do not use towel bars as grab bars. Use non-skid mats or decals in the tub or shower. If you need to sit down in the shower, use a plastic, non-slip stool. Keep the floor dry. Clean up any water that spills on the floor as soon as it  happens. Remove soap buildup in the tub or shower regularly. Attach bath mats securely with double-sided non-slip rug tape. Do not have throw rugs and other things on the floor that can make you trip. What can I do in the bedroom? Use night lights. Make sure that you have a light by your bed that is easy to reach. Do not use any sheets or blankets that are too big for your bed. They should not hang down onto the floor. Have a firm chair that has side arms. You can use this for support while you get dressed. Do not have throw rugs and other things on the floor that can make you trip. What can I do in the kitchen? Clean up any spills right away. Avoid walking on wet floors. Keep items that you use a lot in easy-to-reach places. If you need to reach something above you, use a strong step stool that has a grab bar. Keep electrical cords out of the way. Do not use floor polish or wax that makes floors slippery. If you must use wax, use non-skid floor wax. Do not have throw rugs and other things on the floor that  can make you trip. What can I do with my stairs? Do not leave any items on the stairs. Make sure that there are handrails on both sides of the stairs and use them. Fix handrails that are broken or loose. Make sure that handrails are as long as the stairways. Check any carpeting to make sure that it is firmly attached to the stairs. Fix any carpet that is loose or worn. Avoid having throw rugs at the top or bottom of the stairs. If you do have throw rugs, attach them to the floor with carpet tape. Make sure that you have a light switch at the top of the stairs and the bottom of the stairs. If you do not have them, ask someone to add them for you. What else can I do to help prevent falls? Wear shoes that: Do not have high heels. Have rubber bottoms. Are comfortable and fit you well. Are closed at the toe. Do not wear sandals. If you use a stepladder: Make sure that it is fully opened.  Do not climb a closed stepladder. Make sure that both sides of the stepladder are locked into place. Ask someone to hold it for you, if possible. Clearly mark and make sure that you can see: Any grab bars or handrails. First and last steps. Where the edge of each step is. Use tools that help you move around (mobility aids) if they are needed. These include: Canes. Walkers. Scooters. Crutches. Turn on the lights when you go into a dark area. Replace any light bulbs as soon as they burn out. Set up your furniture so you have a clear path. Avoid moving your furniture around. If any of your floors are uneven, fix them. If there are any pets around you, be aware of where they are. Review your medicines with your doctor. Some medicines can make you feel dizzy. This can increase your chance of falling. Ask your doctor what other things that you can do to help prevent falls. This information is not intended to replace advice given to you by your health care provider. Make sure you discuss any questions you have with your health care provider. Document Released: 12/02/2008 Document Revised: 07/14/2015 Document Reviewed: 03/12/2014 Elsevier Interactive Patient Education  2017 [employer].

## 2022-05-22 NOTE — Progress Notes (Signed)
Subjective:   Amy Mahoney is a 72 y.o. female who presents for Medicare Annual (Subsequent) preventive examination.  I connected with  [name] on 05/22/22 by a audio enabled telemedicine application and verified that I am speaking with the correct person using two identifiers.  Patient Location: Home  Provider Location: Office/Clinic  I discussed the limitations of evaluation and management by telemedicine. The patient expressed understanding and agreed to proceed.   Review of Systems     Cardiac Risk Factors include: advanced age (>63men, >79 women);diabetes mellitus;dyslipidemia;hypertension;obesity (BMI >30kg/m2)     Objective:    There were no vitals filed for this visit. There is no height or weight on file to calculate BMI.     05/22/2022    9:48 AM 08/12/2021   10:56 AM 07/31/2021    8:03 AM 05/18/2021   11:59 AM 09/05/2017    8:04 AM 07/30/2017    7:29 AM 02/09/2017    7:48 AM  Advanced Directives  Does Patient Have a Medical Advance Directive? Yes No No Yes Yes Yes No  Type of Paramedic of [place];Living will   [place];Living will [place];Living will [place]   Does patient want to make changes to medical advance directive? No - Patient declined        Copy of [place] in Chart? No - copy requested   No - copy requested No - copy requested    Would patient like information on creating a medical advance directive?   No - Patient declined        Current Medications (verified) Outpatient Encounter Medications as of 05/22/2022  Medication Sig   atorvastatin (LIPITOR) 40 MG tablet TAKE 1 TABLET BY MOUTH DAILY   B-D UF III MINI PEN NEEDLES 31G X 5 MM MISC USE AS DIRECTED   Calcium Carbonate-Vitamin D (CALCIUM 500 + D PO) Take 1 tablet by mouth 2 (two) times daily.   clonazePAM (KLONOPIN) 0.5 MG tablet TAKE 1 TABLET BY MOUTH 2 TIMES DAILY AS NEEDED FOR ANXIETY.    fenofibrate 160 MG tablet TAKE 1 TABLET BY MOUTH DAILY   hydrocortisone cream 1 % Apply to affected area 2 times daily   levothyroxine (SYNTHROID) 112 MCG tablet TAKE 1 TABLET BY MOUTH DAILY  BEFORE BREAKFAST   lisinopril (ZESTRIL) 5 MG tablet TAKE 1 TABLET BY MOUTH DAILY   moxifloxacin (VIGAMOX) 0.5 % ophthalmic solution Place 1 drop into the left eye 3 (three) times daily.   Multiple Vitamin (MULTIVITAMIN) tablet Take 1 tablet by mouth daily.   omeprazole (PRILOSEC) 40 MG capsule Take 40 mg by mouth daily.   ONETOUCH ULTRA test strip USE AS DIRECTED UP TO 4 TIMES DAILY   tirzepatide (MOUNJARO) 7.5 MG/0.5ML Pen Inject 7.5 mg into the skin once a week.   TRESIBA FLEXTOUCH 100 UNIT/ML FlexTouch Pen INJECT SUBCUTANEOUSLY 10 UNITS  DAILY   venlafaxine (EFFEXOR) 37.5 MG tablet Take 1 tablet (37.5 mg total) by mouth daily.   venlafaxine XR (EFFEXOR-XR) 150 MG 24 hr capsule Take 1 capsule (150 mg total) by mouth daily.   Vitamin D, Ergocalciferol, (DRISDOL) 1.25 MG (50000 UNIT) CAPS capsule TAKE 1 CAPSULE BY MOUTH EVERY 7  DAYS   [DISCONTINUED] clonazePAM (KLONOPIN) 0.5 MG tablet Take 1 tablet (0.5 mg total) by mouth 2 (two) times daily as needed for anxiety.   No facility-administered encounter medications on file as of 05/22/2022.    Allergies (  verified) Patient has no known allergies.   History: Past Medical History:  Diagnosis Date   Abnormal esophagram    Abscess of skin 04/15/2015   Anxiety    Back pain    Bacterial vaginal infection 12/17/2014   Bronchitis with bronchospasm 04/14/2013   Chest heaviness 07/11/2020   Class 3 severe obesity with serious comorbidity and body mass index (BMI) of 40.0 to 44.9 in adult [facility]) 06/10/2018   Constipation    Cough 09/17/2013   DEPRESSION 09/05/2006   Qualifier: Diagnosis of  By: Jerold Coombe     Depression with anxiety 07/11/2020   Diabetes mellitus without complication ([place])    DIZZINESS, CHRONIC 05/23/2006   Qualifier: Diagnosis of  By:  Jerold Coombe     Dysphagia    FATIGUE 06/01/2009   Qualifier: Diagnosis of  By: Jerold Coombe     Gallbladder problem    Gastroesophageal reflux disease 05/24/2017   Heart murmur    Hyperlipidemia    Hyperlipidemia associated with type 2 diabetes mellitus ([place]) 07/11/2020   Hyperlipidemia LDL goal <100 11/06/2012   Hypothyroidism 05/23/2006   Qualifier: Diagnosis of  By: Jerold Coombe     Lichen planus [id number]   MORBID OBESITY 03/30/2008   Qualifier: Diagnosis of  By: Jerold Coombe     Muscle spasm of back 11/19/2014   Obstructive sleep apnea 05/23/2006   NPSG 08/03/94 RDI/AHI 49/hr, weight was 285 pounds CPAP/ Advanced    Paronychia of index finger 08/08/2021   Pharyngitis 03/03/2017   Post-nasal drainage 08/03/2019   Last Assessment & Plan:  Formatting of this note might be different from the original. Concern over postnasal drainage. Chronic at least a year history of the above but over the last month seems to have worsened.  She has tried antihistamine therapy without improvement.  Denies obvious heartburn.  Some associated cough and occasional near emesis episodes.  She is not on an ACE inhibitor. EXAM show   Prediabetes    Preventative health care 01/10/2021   Primary hypertension 07/11/2020   Spider bite    Subungual hematoma of fifth toe of right foot 10/29/2017   Swelling    Type 2 diabetes mellitus with hyperglycemia ([place]) 06/10/2018   Past Surgical History:  Procedure Laterality Date   CHOLECYSTECTOMY     DILATION AND CURETTAGE OF UTERUS     ESOPHAGOGASTRODUODENOSCOPY (EGD) WITH PROPOFOL N/A 09/05/2017   Procedure: ESOPHAGOGASTRODUODENOSCOPY (EGD) WITH PROPOFOL;  Surgeon: Jerene Bears, MD;  Location: [facility];  Service: Gastroenterology;  Laterality: N/A;   fibroids scrapping     TONSILLECTOMY     Family History  Problem Relation Age of Onset   Diabetes Mother    Thyroid disease Mother    Depression Mother    Anxiety disorder Brother    Breast cancer Paternal  Grandmother    Diabetes Maternal Aunt    Lung cancer Maternal Aunt        Nonsmoker   Cancer Maternal Aunt        breast   Breast cancer Maternal Aunt    Cancer Maternal Aunt        lung(nonsmoker)   Depression Other    Hyperlipidemia Other    Arthritis Other    Colon cancer Neg Hx    Pancreatic cancer Neg Hx    Stomach cancer Neg Hx    Esophageal cancer Neg Hx    Rectal cancer Neg Hx    Social History   Socioeconomic History  Marital status: Divorced    Spouse name: Not on file   Number of children: 0   Years of education: Not on file   Highest education level: Not on file  Occupational History   Occupation: Eps Chartered loss adjuster: [employer]    Employer: [employer]   Occupation: RETIRED  Tobacco Use   Smoking status: Former    Packs/day: 0.50    Years: 3.00    Additional pack years: 0.00    Total pack years: 1.50    Types: Cigarettes    Quit date: 08/10/1990    Years since quitting: 31.8   Smokeless tobacco: Never  Vaping Use   Vaping Use: Never used  Substance and Sexual Activity   Alcohol use: Yes    Comment: rarely   Drug use: No   Sexual activity: Not Currently    Partners: Male  Other Topics Concern   Not on file  Social History Narrative   Regular exercise - yes-- 10,000 steps   Caffeine use: 2 cups of coffee daily   Live  in [place] with 66 year old father to assist in care. Will be moving in with brother and his wife into larger home to facilitate better care.    Social Determinants of Health   Financial Resource Strain: Low Risk  (05/22/2022)   Overall Financial Resource Strain (CARDIA)    Difficulty of Paying Living Expenses: Not very hard  Food Insecurity: No Food Insecurity (05/22/2022)   Hunger Vital Sign    Worried About Running Out of Food in the Last Year: Never true    Ran Out of Food in the Last Year: Never true  Transportation Needs: No Transportation Needs (05/22/2022)   PRAPARE - Radiographer, therapeutic (Medical): No    Lack of Transportation (Non-Medical): No  Physical Activity: Insufficiently Active (05/22/2022)   Exercise Vital Sign    Days of Exercise per Week: 3 days    Minutes of Exercise per Session: 20 min  Stress: No Stress Concern Present (05/22/2022)   [place]    Feeling of Stress : Only a little  Social Connections: Unknown (05/22/2022)   Social Connection and Isolation Panel [NHANES]    Frequency of Communication with Friends and Family: Once a week    Frequency of Social Gatherings with Friends and Family: Three times a week    Attends Religious Services: Not on file    Active Member of Clubs or Organizations: Yes    Attends Archivist Meetings: More than 4 times per year    Marital Status: Divorced    Tobacco Counseling Counseling given: Not Answered   Clinical Intake:  Pre-visit preparation completed: Yes  Pain : No/denies pain  Nutritional Risks: None Diabetes: Yes CBG done?: No Did pt. bring in CBG monitor from home?: No  How often do you need to have someone help you when you read instructions, pamphlets, or other written materials from your doctor or pharmacy?: 1 - Never  Activities of Daily Living    05/22/2022    7:49 AM 05/21/2022    7:46 AM  In your present state of health, do you have any difficulty performing the following activities:  Hearing? 1 0  Comment wears hearing aids   Vision? 0 0  Difficulty concentrating or making decisions? 0 0  Walking or climbing stairs? 0 0  Dressing or bathing? 0 0  Doing errands, shopping? 0  0  Preparing Food and eating ? N N  Using the Toilet? N N  In the past six months, have you accidently leaked urine? N N  Do you have problems with loss of bowel control? N N  Managing your Medications? N N  Managing your Finances? N N  Housekeeping or managing your Housekeeping? N N    Patient Care Team: Carollee Herter, Alferd Apa,  DO as PCP - General (Family Medicine) Deneise Lever, MD as Consulting Physician (Pulmonary Disease) Dermatology, [facility], Lajuan Lines, MD as Consulting Physician (Gastroenterology) Cherre Robins, [place] (Pharmacist)  Indicate any recent Medical Services you may have received from other than [facility] providers in the past year (date may be approximate).     Assessment:   This is a routine wellness examination for Weslynn.  Hearing/Vision screen No results found.  Dietary issues and exercise activities discussed: Current Exercise Habits: Home exercise routine, Type of exercise: walking, Time (Minutes): 60, Frequency (Times/Week): 7 (weather permitting), Weekly Exercise (Minutes/Week): 420, Intensity: Mild, Exercise limited by: None identified   Goals Addressed   None    Depression Screen    05/22/2022    9:50 AM 01/02/2022    6:19 PM 09/28/2021    8:53 AM 08/08/2021    6:05 PM 08/08/2021    6:04 PM 05/18/2021   11:56 AM 07/11/2020    8:22 AM  PHQ 2/9 Scores  PHQ - 2 Score 0 0  0 0 0 0  PHQ- 9 Score  0  0        Information is confidential and restricted. Go to Review Flowsheets to unlock data.    Fall Risk    05/22/2022    7:49 AM 05/21/2022    7:46 AM 02/02/2022   11:07 AM 05/18/2021   12:00 PM 01/10/2021    1:08 PM  Fall Risk   Falls in the past year? 0 0 0 0 0  Number falls in past yr: 0  0 0 0  Injury with Fall? 0 0 0 0 0  Risk for fall due to : History of fall(s)   No Fall Risks   Follow up Falls evaluation completed  Falls evaluation completed Falls prevention discussed Falls evaluation completed    [place]:  Any stairs in or around the home? No  Home free of loose throw rugs in walkways, pet beds, electrical cords, etc? Yes  Adequate lighting in your home to reduce risk of falls? Yes   ASSISTIVE DEVICES UTILIZED TO PREVENT FALLS:  Life alert? No  Use of a cane, walker or w/c? No  Grab bars in the bathroom? Yes   Shower chair or bench in shower? Yes  Elevated toilet seat or a handicapped toilet?  Comfort height  TIMED UP AND GO:  Was the test performed?  No, audio visit .   Cognitive Function:        05/22/2022    9:55 AM 05/18/2021   12:04 PM  6CIT Screen  What Year? 0 points 0 points  What month? 0 points 0 points  What time? 0 points 0 points  Count back from 20 0 points 0 points  Months in reverse 0 points 0 points  Repeat phrase 0 points 0 points  Total Score 0 points 0 points    Immunizations Immunization History  Administered Date(s) Administered   Fluad Quad(high Dose 65+) 11/14/2018, 12/21/2019, 01/10/2021, 01/02/2022   Influenza Split 11/19/2012   Influenza, High Dose  Seasonal PF 12/14/2016, 10/29/2017   Influenza,inj,Quad PF,6+ Mos 11/20/2015   Influenza-Unspecified 12/16/2013   PNEUMOCOCCAL CONJUGATE-20 01/10/2021   Pneumococcal Conjugate-13 11/19/2014   Pneumococcal Polysaccharide-23 10/30/2013   Td 08/08/2021   Tdap 08/01/2010   Zoster Recombinat (Shingrix) 12/23/2017, 04/21/2018   Zoster, Live 08/01/2010    TDAP status: Up to date  Flu Vaccine status: Up to date  Pneumococcal vaccine status: Up to date  Covid-19 vaccine status: Declined, Education has been provided regarding the importance of this vaccine but patient still declined. Advised may receive this vaccine at local pharmacy or Health Dept.or vaccine clinic. Aware to provide a copy of the vaccination record if obtained from local pharmacy or Health Dept. Verbalized acceptance and understanding.  Qualifies for Shingles Vaccine? Yes   Zostavax completed Yes   Shingrix Completed?: Yes  Screening Tests Health Maintenance  Topic Date Due   COVID-19 Vaccine (1) Never done   FOOT EXAM  07/11/2021   MAMMOGRAM  04/27/2022   Medicare Annual Wellness (AWV)  05/19/2022   HEMOGLOBIN A1C  08/04/2022   INFLUENZA VACCINE  09/20/2022   OPHTHALMOLOGY EXAM  10/20/2022   Diabetic kidney evaluation - eGFR  measurement  02/03/2023   Diabetic kidney evaluation - Urine ACR  02/03/2023   COLONOSCOPY (Pts 45-31yrs Insurance coverage will need to be confirmed)  01/22/2028   DTaP/Tdap/Td (3 - Td or Tdap) 08/09/2031   Pneumonia Vaccine 41+ Years old  Completed   DEXA SCAN  Completed   Hepatitis C Screening  Completed   Zoster Vaccines- Shingrix  Completed   HPV VACCINES  Aged Out    Health Maintenance  Health Maintenance Due  Topic Date Due   COVID-19 Vaccine (1) Never done   FOOT EXAM  07/11/2021   MAMMOGRAM  04/27/2022   Medicare Annual Wellness (AWV)  05/19/2022    Colorectal cancer screening: Type of screening: Colonoscopy. Completed 01/21/18. Repeat every 10 years  Mammogram status: Completed 04/26/21. Repeat every year scheduled for 05/28/22  Bone Density status: Ordered 05/22/22. Pt provided with contact info and advised to call to schedule appt.  Lung Cancer Screening: (Low Dose CT Chest recommended if Age 40-80 years, 30 pack-year currently smoking OR have quit w/in 15years.) does not qualify.   Additional Screening:  Hepatitis C Screening: does qualify; Completed 11/19/14  Vision Screening: Recommended annual ophthalmology exams for early detection of glaucoma and other disorders of the eye. Is the patient up to date with their annual eye exam?  Yes  Who is the provider or what is the name of the office in which the patient attends annual eye exams? [facility] If pt is not established with a provider, would they like to be referred to a provider to establish care? No .   Dental Screening: Recommended annual dental exams for proper oral hygiene  Community Resource Referral / Chronic Care Management: CRR required this visit?  No   CCM required this visit?  No      Plan:     I have personally reviewed and noted the following in the patient's chart:   Medical and social history Use of alcohol, tobacco or illicit drugs  Current medications and supplements  including opioid prescriptions. Patient is not currently taking opioid prescriptions. Functional ability and status Nutritional status Physical activity Advanced directives List of other physicians Hospitalizations, surgeries, and ER visits in previous 12 months Vitals Screenings to include cognitive, depression, and falls Referrals and appointments  In addition, I have reviewed and discussed with patient certain  preventive protocols, quality metrics, and best practice recommendations. A written personalized care plan for preventive services as well as general preventive health recommendations were provided to patient.   Due to this being a telephonic visit, the after visit summary with patients personalized plan was offered to patient via mail or my-chart.  Patient would like to access on my-chart.  Beatris Ship, [place]   05/22/2022   Nurse Notes: None

## 2022-05-28 ENCOUNTER — Ambulatory Visit (HOSPITAL_BASED_OUTPATIENT_CLINIC_OR_DEPARTMENT_OTHER)
Admission: RE | Admit: 2022-05-28 | Discharge: 2022-05-28 | Disposition: A | Discharge status: Home / Self Care | Payer: Medicare Other | Source: Ambulatory Visit | Attending: Family Medicine | Admitting: Family Medicine

## 2022-05-28 ENCOUNTER — Encounter: Payer: Self-pay | Admitting: Family Medicine

## 2022-05-28 ENCOUNTER — Ambulatory Visit (INDEPENDENT_AMBULATORY_CARE_PROVIDER_SITE_OTHER): Payer: Medicare Other | Admitting: Family Medicine

## 2022-05-28 ENCOUNTER — Encounter (HOSPITAL_BASED_OUTPATIENT_CLINIC_OR_DEPARTMENT_OTHER): Payer: Self-pay

## 2022-05-28 DIAGNOSIS — E038 Other specified hypothyroidism: Secondary | ICD-10-CM | POA: Diagnosis not present

## 2022-05-28 DIAGNOSIS — E785 Hyperlipidemia, unspecified: Secondary | ICD-10-CM

## 2022-05-28 DIAGNOSIS — Z1231 Encounter for screening mammogram for malignant neoplasm of breast: Secondary | ICD-10-CM | POA: Insufficient documentation

## 2022-05-28 DIAGNOSIS — I10 Essential (primary) hypertension: Secondary | ICD-10-CM

## 2022-05-28 DIAGNOSIS — E1165 Type 2 diabetes mellitus with hyperglycemia: Secondary | ICD-10-CM

## 2022-05-28 DIAGNOSIS — I350 Nonrheumatic aortic (valve) stenosis: Secondary | ICD-10-CM

## 2022-05-28 DIAGNOSIS — E1169 Type 2 diabetes mellitus with other specified complication: Secondary | ICD-10-CM

## 2022-05-28 LAB — LIPID PANEL
Cholesterol: 138 mg/dL (ref 0–200)
HDL: 34.3 mg/dL — ABNORMAL LOW (ref >39.00)
LDL Cholesterol: 80 mg/dL (ref 0–99)
NonHDL: 103.98
Total CHOL/HDL Ratio: 4
Triglycerides: 122 mg/dL (ref 0.0–149.0)
VLDL: 24.4 mg/dL (ref 0.0–40.0)

## 2022-05-28 LAB — CBC WITH DIFFERENTIAL/PLATELET
Basophils Absolute: 0 10*3/uL (ref 0.0–0.1)
Basophils Relative: 0.7 % (ref 0.0–3.0)
Eosinophils Absolute: 0.3 10*3/uL (ref 0.0–0.7)
Eosinophils Relative: 5 % (ref 0.0–5.0)
HCT: 39.9 % (ref 36.0–46.0)
Hemoglobin: 13.3 g/dL (ref 12.0–15.0)
Lymphocytes Relative: 30.9 % (ref 12.0–46.0)
Lymphs Abs: 1.7 10*3/uL (ref 0.7–4.0)
MCHC: 33.5 g/dL (ref 30.0–36.0)
MCV: 91.7 fl (ref 78.0–100.0)
Monocytes Absolute: 0.4 10*3/uL (ref 0.1–1.0)
Monocytes Relative: 7.9 % (ref 3.0–12.0)
Neutro Abs: 3 10*3/uL (ref 1.4–7.7)
Neutrophils Relative %: 55.5 % (ref 43.0–77.0)
Platelets: 282 10*3/uL (ref 150.0–400.0)
RBC: 4.35 Mil/uL (ref 3.87–5.11)
RDW: 14.7 % (ref 11.5–15.5)
WBC: 5.5 10*3/uL (ref 4.0–10.5)

## 2022-05-28 LAB — COMPREHENSIVE METABOLIC PANEL
ALT: 28 U/L (ref 0–35)
AST: 39 U/L — ABNORMAL HIGH (ref 0–37)
Albumin: 4.3 g/dL (ref 3.5–5.2)
Alkaline Phosphatase: 49 U/L (ref 39–117)
BUN: 18 mg/dL (ref 6–23)
CO2: 25 mEq/L (ref 19–32)
Calcium: 10.1 mg/dL (ref 8.4–10.5)
Chloride: 103 mEq/L (ref 96–112)
Creatinine, Ser: 0.88 mg/dL (ref 0.40–1.20)
GFR: 65.57 mL/min (ref >60.00)
Glucose, Bld: 105 mg/dL — ABNORMAL HIGH (ref 70–99)
Potassium: 4.5 mEq/L (ref 3.5–5.1)
Sodium: 137 mEq/L (ref 135–145)
Total Bilirubin: 0.5 mg/dL (ref 0.2–1.2)
Total Protein: 6.5 g/dL (ref 6.0–8.3)

## 2022-05-28 LAB — HEMOGLOBIN A1C: Hgb A1c MFr Bld: 6.3 % (ref 4.6–6.5)

## 2022-05-28 NOTE — Assessment & Plan Note (Signed)
stable °

## 2022-05-28 NOTE — Progress Notes (Signed)
Subjective:   By signing my name below, I, Isabelle CourseRachel Mahoney, attest that this documentation has been prepared under the direction and in the presence of Donato SchultzYvonne R Lowne Chase, DO 05/28/22   Patient ID: Amy Mahoney, female    DOB: 10/07/1950, 72 y.o.   MRN: [id number]  Chief Complaint  Patient presents with   Weight Loss    Pt states no side effects from new dose.    Follow-up    HPI Patient is in today for a follow up. She is overall well.   She is compliant with Mounjaro 7.5 mg. She reports having lost over 20 lbs. She is also taking [place]. She was previously prescribe 15 units and is currently taking 10 units. She is comfortable with staying at her current dose. She monitors her blood sugar levels at home and reports readings of 100. She has been maintaining a low carb diet and does intermittent fasting. Her goal is to lose about 10 lbs at a time.   Past Medical History:  Diagnosis Date   Abnormal esophagram    Abscess of skin 04/15/2015   Anxiety    Back pain    Bacterial vaginal infection 12/17/2014   Bronchitis with bronchospasm 04/14/2013   Chest heaviness 07/11/2020   Class 3 severe obesity with serious comorbidity and body mass index (BMI) of 40.0 to 44.9 in adult 06/10/2018   Constipation    Cough 09/17/2013   DEPRESSION 09/05/2006   Qualifier: Diagnosis of  By: Janit BernLowne DO, Danica Camarena     Depression with anxiety 07/11/2020   Diabetes mellitus without complication    DIZZINESS, CHRONIC 05/23/2006   Qualifier: Diagnosis of  By: Janit BernLowne DO, Koreen Lizaola     Dysphagia    FATIGUE 06/01/2009   Qualifier: Diagnosis of  By: Janit BernLowne DO, Rayane Gallardo     Gallbladder problem    Gastroesophageal reflux disease 05/24/2017   Heart murmur    Hyperlipidemia    Hyperlipidemia associated with type 2 diabetes mellitus 07/11/2020   Hyperlipidemia LDL goal <100 11/06/2012   Hypothyroidism 05/23/2006   Qualifier: Diagnosis of  By: Janit BernLowne DO, Xaden Kaufman     Lichen planus 12/01/2020   MORBID OBESITY 03/30/2008   Qualifier:  Diagnosis of  By: Janit BernLowne DO, Dhruti Ghuman     Muscle spasm of back 11/19/2014   Obstructive sleep apnea 05/23/2006   NPSG 08/03/94 RDI/AHI 49/hr, weight was 285 pounds CPAP/ [facility]    Paronychia of index finger 08/08/2021   Pharyngitis 03/03/2017   Post-nasal drainage 08/03/2019   Last Assessment & Plan:  Formatting of this note might be different from the original. Concern over postnasal drainage. Chronic at least a year history of the above but over the last month seems to have worsened.  She has tried antihistamine therapy without improvement.  Denies obvious heartburn.  Some associated cough and occasional near emesis episodes.  She is not on an ACE inhibitor. EXAM show   Prediabetes    Preventative health care 01/10/2021   Primary hypertension 07/11/2020   Spider bite    Subungual hematoma of fifth toe of right foot 10/29/2017   Swelling    Type 2 diabetes mellitus with hyperglycemia 06/10/2018    Past Surgical History:  Procedure Laterality Date   CHOLECYSTECTOMY     DILATION AND CURETTAGE OF UTERUS     ESOPHAGOGASTRODUODENOSCOPY (EGD) WITH PROPOFOL N/A 09/05/2017   Procedure: ESOPHAGOGASTRODUODENOSCOPY (EGD) WITH PROPOFOL;  Surgeon: Beverley FiedlerPyrtle, Jay M, MD;  Location: [facility];  Service: Gastroenterology;  Laterality: N/A;  fibroids scrapping     TONSILLECTOMY      Family History  Problem Relation Age of Onset   Diabetes Mother    Thyroid disease Mother    Depression Mother    Anxiety disorder Brother    Breast cancer Paternal Grandmother    Diabetes Maternal Aunt    Lung cancer Maternal Aunt        Nonsmoker   Cancer Maternal Aunt        breast   Breast cancer Maternal Aunt    Cancer Maternal Aunt        lung(nonsmoker)   Depression Other    Hyperlipidemia Other    Arthritis Other    Colon cancer Neg Hx    Pancreatic cancer Neg Hx    Stomach cancer Neg Hx    Esophageal cancer Neg Hx    Rectal cancer Neg Hx     Social History   Socioeconomic History   Marital status:  Divorced    Spouse name: Not on file   Number of children: 0   Years of education: Not on file   Highest education level: Master's degree (e.g., MA, MS, MEng, MEd, MSW, MBA)  Occupational History   Occupation: Eps Midwife: [employer]    Employer: [employer]   Occupation: RETIRED  Tobacco Use   Smoking status: Former    Packs/day: 0.50    Years: 3.00    Additional pack years: 0.00    Total pack years: 1.50    Types: Cigarettes    Quit date: 08/10/1990    Years since quitting: 31.8   Smokeless tobacco: Never  Vaping Use   Vaping Use: Never used  Substance and Sexual Activity   Alcohol use: Yes    Comment: rarely   Drug use: No   Sexual activity: Not Currently    Partners: Male  Other Topics Concern   Not on file  Social History Narrative   Regular exercise - yes-- 10,000 steps   Caffeine use: 2 cups of coffee daily   Live  in [place] with 33 year old father to assist in care. Will be moving in with brother and his wife into larger home to facilitate better care.    Social Determinants of Health   Financial Resource Strain: Low Risk  (05/27/2022)   Overall Financial Resource Strain (CARDIA)    Difficulty of Paying Living Expenses: Not hard at all  Food Insecurity: No Food Insecurity (05/27/2022)   Hunger Vital Sign    Worried About Running Out of Food in the Last Year: Never true    Ran Out of Food in the Last Year: Never true  Transportation Needs: No Transportation Needs (05/27/2022)   PRAPARE - Administrator, Civil Service (Medical): No    Lack of Transportation (Non-Medical): No  Physical Activity: Insufficiently Active (05/27/2022)   Exercise Vital Sign    Days of Exercise per Week: 3 days    Minutes of Exercise per Session: 20 min  Stress: No Stress Concern Present (05/27/2022)   [employer] of Occupational Health - Occupational Stress Questionnaire    Feeling of Stress : Only a little  Social Connections: Moderately  Integrated (05/27/2022)   Social Connection and Isolation Panel [NHANES]    Frequency of Communication with Friends and Family: More than three times a week    Frequency of Social Gatherings with Friends and Family: More than three times a week    Attends Religious Services: More  than 4 times per year    Active Member of Clubs or Organizations: Yes    Attends Banker Meetings: More than 4 times per year    Marital Status: Divorced  Intimate Partner Violence: Not At Risk (05/22/2022)   Humiliation, Afraid, Rape, and Kick questionnaire    Fear of Current or Ex-Partner: No    Emotionally Abused: No    Physically Abused: No    Sexually Abused: No    Outpatient Medications Prior to Visit  Medication Sig Dispense Refill   atorvastatin (LIPITOR) 40 MG tablet TAKE 1 TABLET BY MOUTH DAILY 90 tablet 3   B-D UF III MINI PEN NEEDLES 31G X 5 MM MISC USE AS DIRECTED 100 each 3   Calcium Carbonate-Vitamin D (CALCIUM 500 + D PO) Take 1 tablet by mouth 2 (two) times daily.     clonazePAM (KLONOPIN) 0.5 MG tablet TAKE 1 TABLET BY MOUTH 2 TIMES DAILY AS NEEDED FOR ANXIETY. 30 tablet 3   fenofibrate 160 MG tablet TAKE 1 TABLET BY MOUTH DAILY 100 tablet 2   hydrocortisone cream 1 % Apply to affected area 2 times daily 15 g 0   levothyroxine (SYNTHROID) 112 MCG tablet TAKE 1 TABLET BY MOUTH DAILY  BEFORE BREAKFAST 100 tablet 2   lisinopril (ZESTRIL) 5 MG tablet TAKE 1 TABLET BY MOUTH DAILY 90 tablet 1   moxifloxacin (VIGAMOX) 0.5 % ophthalmic solution Place 1 drop into the left eye 3 (three) times daily. 3 mL 0   Multiple Vitamin (MULTIVITAMIN) tablet Take 1 tablet by mouth daily.     omeprazole (PRILOSEC) 40 MG capsule Take 40 mg by mouth daily.     ONETOUCH ULTRA test strip USE AS DIRECTED UP TO 4 TIMES DAILY 100 strip 1   tirzepatide (MOUNJARO) 7.5 MG/0.5ML Pen Inject 7.5 mg into the skin once a week. 6 mL 3   TRESIBA FLEXTOUCH 100 UNIT/ML FlexTouch Pen INJECT SUBCUTANEOUSLY 10 UNITS  DAILY 15  mL 2   venlafaxine (EFFEXOR) 37.5 MG tablet Take 1 tablet (37.5 mg total) by mouth daily. 90 tablet 1   venlafaxine XR (EFFEXOR-XR) 150 MG 24 hr capsule Take 1 capsule (150 mg total) by mouth daily. 90 capsule 3   Vitamin D, Ergocalciferol, (DRISDOL) 1.25 MG (50000 UNIT) CAPS capsule TAKE 1 CAPSULE BY MOUTH EVERY 7  DAYS 15 capsule 2   No facility-administered medications prior to visit.    No Known Allergies  Review of Systems  Constitutional:  Negative for fever, malaise/fatigue and weight loss.  HENT:  Negative for congestion.   Eyes:  Negative for blurred vision.  Respiratory:  Negative for shortness of breath.   Cardiovascular:  Negative for chest pain, palpitations and leg swelling.  Gastrointestinal:  Negative for abdominal pain, blood in stool and nausea.  Genitourinary:  Negative for dysuria and frequency.  Musculoskeletal:  Negative for falls.  Skin:  Negative for rash.  Neurological:  Negative for dizziness, loss of consciousness and headaches.  Endo/Heme/Allergies:  Negative for environmental allergies.  Psychiatric/Behavioral:  Negative for depression. The patient is not nervous/anxious.        Objective:    Physical Exam Vitals and nursing note reviewed.  Constitutional:      Appearance: She is well-developed.  HENT:     Head: Normocephalic and atraumatic.  Eyes:     Conjunctiva/sclera: Conjunctivae normal.  Neck:     Thyroid: No thyromegaly.     Vascular: No carotid bruit or JVD.  Cardiovascular:  Rate and Rhythm: Normal rate and regular rhythm.     Heart sounds: Murmur heard.     Systolic murmur is present with a grade of 2/6.  Pulmonary:     Effort: Pulmonary effort is normal. No respiratory distress.     Breath sounds: Normal breath sounds. No wheezing or rales.  Chest:     Chest wall: No tenderness.  Musculoskeletal:     Cervical back: Normal range of motion and neck supple.  Neurological:     Mental Status: She is alert and oriented to person,  place, and time.     BP 120/78 (BP Location: Left Arm, Patient Position: Sitting, Cuff Size: Large)   Pulse 71   Temp 98.5 F (36.9 C) (Oral)   Resp 18   Ht 5\' 5"  (1.651 m)   Wt 239 lb 9.6 oz (108.7 kg)   SpO2 97%   BMI 39.87 kg/m  Wt Readings from Last 3 Encounters:  05/28/22 239 lb 9.6 oz (108.7 kg)  02/02/22 259 lb 12.8 oz (117.8 kg)  01/02/22 260 lb (117.9 kg)       Assessment & Plan:  Morbid obesity Assessment & Plan: Con't ozempic Pt is doing well with diet    Type 2 diabetes mellitus with hyperglycemia, unspecified whether long term insulin use Assessment & Plan: hgba1c TO BE CHECKED, minimize simple carbs. Increase exercise as tolerated. Continue current meds   Orders: -     Comprehensive metabolic panel -     Hemoglobin A1c  Primary hypertension Assessment & Plan: Well controlled, no changes to meds. Encouraged heart healthy diet such as the DASH diet and exercise as tolerated.    Orders: -     Lipid panel -     CBC with Differential/Platelet -     Comprehensive metabolic panel  Hyperlipidemia associated with type 2 diabetes mellitus -     Lipid panel -     Comprehensive metabolic panel  Other specified hypothyroidism Assessment & Plan: Check labs Lab Results  Component Value Date   TSH 0.83 07/28/2021   Check labs  See synthroid   Hyperlipidemia LDL goal <100 Assessment & Plan: Encourage heart healthy diet such as MIND or DASH diet, increase exercise, avoid trans fats, simple carbohydrates and processed foods, consider a krill or fish or flaxseed oil cap daily.     Mild aortic stenosis Assessment & Plan: stable      I,Amy Mahoney,acting as a scribe for Donato Schultz, DO.,have documented all relevant documentation on the behalf of Donato Schultz, DO,as directed by  Donato Schultz, DO while in the presence of Donato Schultz, DO.   I, Donato Schultz, DO, personally preformed the services described in  this documentation.  All medical record entries made by the scribe were at my direction and in my presence.  I have reviewed the chart and discharge instructions (if applicable) and agree that the record reflects my personal performance and is accurate and complete. 05/28/22   Donato Schultz, DO

## 2022-05-28 NOTE — Assessment & Plan Note (Addendum)
Check labs Lab Results  Component Value Date   TSH 0.83 07/28/2021   Check labs  See synthroid

## 2022-05-28 NOTE — Assessment & Plan Note (Signed)
Encourage heart healthy diet such as MIND or DASH diet, increase exercise, avoid trans fats, simple carbohydrates and processed foods, consider a krill or fish or flaxseed oil cap daily.  °

## 2022-05-28 NOTE — Assessment & Plan Note (Signed)
Con't ozempic Pt is doing well with diet

## 2022-05-28 NOTE — Patient Instructions (Signed)

## 2022-05-28 NOTE — Assessment & Plan Note (Signed)
Well controlled, no changes to meds. Encouraged heart healthy diet such as the DASH diet and exercise as tolerated.  °

## 2022-05-28 NOTE — Assessment & Plan Note (Signed)
hgba1c TO BE CHECKED, minimize simple carbs. Increase exercise as tolerated. Continue current meds ? ?

## 2022-06-05 ENCOUNTER — Telehealth (HOSPITAL_BASED_OUTPATIENT_CLINIC_OR_DEPARTMENT_OTHER): Payer: Self-pay

## 2022-06-07 ENCOUNTER — Telehealth (HOSPITAL_BASED_OUTPATIENT_CLINIC_OR_DEPARTMENT_OTHER): Payer: Self-pay

## 2022-06-18 ENCOUNTER — Other Ambulatory Visit: Payer: Self-pay | Admitting: Family Medicine

## 2022-06-18 DIAGNOSIS — E785 Hyperlipidemia, unspecified: Secondary | ICD-10-CM

## 2022-06-19 ENCOUNTER — Encounter (HOSPITAL_BASED_OUTPATIENT_CLINIC_OR_DEPARTMENT_OTHER): Payer: Self-pay | Admitting: Pediatrics

## 2022-06-19 ENCOUNTER — Emergency Department (HOSPITAL_BASED_OUTPATIENT_CLINIC_OR_DEPARTMENT_OTHER): Payer: Medicare Other

## 2022-06-19 ENCOUNTER — Telehealth: Payer: Self-pay | Admitting: Family Medicine

## 2022-06-19 ENCOUNTER — Observation Stay (HOSPITAL_BASED_OUTPATIENT_CLINIC_OR_DEPARTMENT_OTHER)
Admission: EM | Admit: 2022-06-19 | Discharge: 2022-06-20 | Disposition: A | Discharge status: Home / Self Care | Payer: Medicare Other | Attending: Internal Medicine | Admitting: Internal Medicine

## 2022-06-19 ENCOUNTER — Other Ambulatory Visit: Payer: Self-pay

## 2022-06-19 DIAGNOSIS — F418 Other specified anxiety disorders: Secondary | ICD-10-CM | POA: Diagnosis present

## 2022-06-19 DIAGNOSIS — E785 Hyperlipidemia, unspecified: Secondary | ICD-10-CM | POA: Insufficient documentation

## 2022-06-19 DIAGNOSIS — Z87891 Personal history of nicotine dependence: Secondary | ICD-10-CM | POA: Insufficient documentation

## 2022-06-19 DIAGNOSIS — I6389 Other cerebral infarction: Secondary | ICD-10-CM

## 2022-06-19 DIAGNOSIS — E039 Hypothyroidism, unspecified: Secondary | ICD-10-CM | POA: Diagnosis not present

## 2022-06-19 DIAGNOSIS — Z79899 Other long term (current) drug therapy: Secondary | ICD-10-CM | POA: Diagnosis not present

## 2022-06-19 DIAGNOSIS — Z7982 Long term (current) use of aspirin: Secondary | ICD-10-CM | POA: Insufficient documentation

## 2022-06-19 DIAGNOSIS — I10 Essential (primary) hypertension: Secondary | ICD-10-CM | POA: Insufficient documentation

## 2022-06-19 DIAGNOSIS — Z1231 Encounter for screening mammogram for malignant neoplasm of breast: Secondary | ICD-10-CM | POA: Diagnosis not present

## 2022-06-19 DIAGNOSIS — E119 Type 2 diabetes mellitus without complications: Secondary | ICD-10-CM | POA: Insufficient documentation

## 2022-06-19 DIAGNOSIS — Z7902 Long term (current) use of antithrombotics/antiplatelets: Secondary | ICD-10-CM | POA: Insufficient documentation

## 2022-06-19 DIAGNOSIS — I639 Cerebral infarction, unspecified: Principal | ICD-10-CM | POA: Insufficient documentation

## 2022-06-19 DIAGNOSIS — R200 Anesthesia of skin: Secondary | ICD-10-CM | POA: Diagnosis not present

## 2022-06-19 HISTORY — DX: Cerebral infarction, unspecified: I63.9

## 2022-06-19 LAB — CBC WITH DIFFERENTIAL/PLATELET
Abs Immature Granulocytes: 0.04 10*3/uL (ref 0.00–0.07)
Basophils Absolute: 0.1 10*3/uL (ref 0.0–0.1)
Basophils Relative: 1 %
Eosinophils Absolute: 0.4 10*3/uL (ref 0.0–0.5)
Eosinophils Relative: 7 %
HCT: 40.3 % (ref 36.0–46.0)
Hemoglobin: 12.9 g/dL (ref 12.0–15.0)
Immature Granulocytes: 1 %
Lymphocytes Relative: 32 %
Lymphs Abs: 1.8 10*3/uL (ref 0.7–4.0)
MCH: 29.9 pg (ref 26.0–34.0)
MCHC: 32 g/dL (ref 30.0–36.0)
MCV: 93.5 fL (ref 80.0–100.0)
Monocytes Absolute: 0.7 10*3/uL (ref 0.1–1.0)
Monocytes Relative: 12 %
Neutro Abs: 2.6 10*3/uL (ref 1.7–7.7)
Neutrophils Relative %: 47 %
Platelets: 265 10*3/uL (ref 150–400)
RBC: 4.31 MIL/uL (ref 3.87–5.11)
RDW: 14.2 % (ref 11.5–15.5)
WBC: 5.6 10*3/uL (ref 4.0–10.5)
nRBC: 0 % (ref 0.0–0.2)

## 2022-06-19 LAB — COMPREHENSIVE METABOLIC PANEL
ALT: 28 U/L (ref 0–44)
AST: 44 U/L — ABNORMAL HIGH (ref 15–41)
Albumin: 3.9 g/dL (ref 3.5–5.0)
Alkaline Phosphatase: 49 U/L (ref 38–126)
Anion gap: 6 (ref 5–15)
BUN: 20 mg/dL (ref 8–23)
CO2: 26 mmol/L (ref 22–32)
Calcium: 9.3 mg/dL (ref 8.9–10.3)
Chloride: 106 mmol/L (ref 98–111)
Creatinine, Ser: 0.92 mg/dL (ref 0.44–1.00)
GFR, Estimated: >60 mL/min (ref >60)
Glucose, Bld: 110 mg/dL — ABNORMAL HIGH (ref 70–99)
Potassium: 5 mmol/L (ref 3.5–5.1)
Sodium: 138 mmol/L (ref 135–145)
Total Bilirubin: 0.5 mg/dL (ref 0.3–1.2)
Total Protein: 7.1 g/dL (ref 6.5–8.1)

## 2022-06-19 LAB — CBG MONITORING, ED: Glucose-Capillary: 81 mg/dL (ref 70–99)

## 2022-06-19 NOTE — Telephone Encounter (Signed)
FYI: This call has been transferred to Access Nurse. Once the result note has been entered staff can address the message at that time.  Patient called in with the following symptoms:  Red Word: Tingling and Unexplained Numbness   Please advise at Mobile [phone] (mobile)  Message is routed to Provider Pool and [facility] Triage

## 2022-06-19 NOTE — Progress Notes (Signed)
[facility] admitting and dr.bhagat of neurology notified of pt arrival to floor

## 2022-06-19 NOTE — Plan of Care (Addendum)
Plan of Care Note for accepted transfer   Patient: Amy Mahoney MRN: [id number]   DOA: 06/19/2022  Facility requesting transfer: [facility] EDP: Dr Dalene Seltzer Requesting Provider: Dr Claiborne Rigg Reason for transfer: Concern for stroke Facility course: Was called by ED physician, patient is a 72 year old female with history of hypertension, diabetes, hyperlipidemia, aortic stenosis per EDP presented to the ED with left-sided facial numbness and tingling and left hand numbness.  1 day prior to admission patient noted to have tingling in the right hand which subsequently resolved prior to patient going to bed.  ED physician did not know when patient was last seen normal.  Vital signs stable.  Basic metabolic profile unremarkable.  Hepatic panel with an elevated AST.  CBC unremarkable.  Head CT with questionable hypodensity in the right pons which could be an artifact.  EDP stated spoke with Dr. Otelia Limes, neurology who felt send head CT findings were concerning for an acute stroke and recommended patient be admitted for stroke workup.  Plan of care: The patient is accepted for admission to Telemetry unit, at [facility]..  Once patient arrives will need to consult neurohospitalist for formal consult, EDP unsure whether neurology will be seeing the patient in formal consultation. Addendum: Per EDP patient will need a formal neuro consult when admitted.  No charge  Author: Ramiro Harvest, MD 06/19/2022  Check [email or web address] for on-call coverage.  Nursing staff, Please call [facility] Admits & Consults System-Wide number on Amion as soon as patient's arrival, so appropriate admitting provider can evaluate the pt.

## 2022-06-19 NOTE — ED Notes (Signed)
Pt ambulatory to [facility] independently. Steady gait

## 2022-06-19 NOTE — ED Triage Notes (Signed)
Reported numbness on left and left side of face that she notice when she woke up today; stated she it started on right hand and now on left hand. Brother at bedside reports about 35 lbs weight loss the last month throughout the last month. Stated was sent by PCP to rule out stroke.

## 2022-06-19 NOTE — ED Provider Notes (Signed)
Amy Mahoney [facility]Y DEPARTMENT AT [facility] Provider Note   CSN: [id number] Arrival date & time: 06/19/22  1324     History {Add pertinent medical, surgical, social history, OB history to HPI:1} Chief Complaint  Patient presents with   Numbness    Amy Mahoney is a 72 y.o. female.  HPI     Numbness right hand Monday Went to bed, had resolved, woke up and was left hand numbness just left hand and around the lip on the left side Normal strength,   Home Medications Prior to Admission medications   Medication Sig Start Date End Date Taking? Authorizing Provider  atorvastatin (LIPITOR) 40 MG tablet TAKE 1 TABLET BY MOUTH DAILY 06/19/22   Donato Schultz, DO  B-D UF III MINI PEN NEEDLES 31G X 5 MM MISC USE AS DIRECTED 09/11/21   Zola Button, Grayling Congress, DO  Calcium Carbonate-Vitamin D (CALCIUM 500 + D PO) Take 1 tablet by mouth 2 (two) times daily.    [provider]  clonazePAM (KLONOPIN) 0.5 MG tablet TAKE 1 TABLET BY MOUTH 2 TIMES DAILY AS NEEDED FOR ANXIETY. 05/01/22   Joan Flores, NP  fenofibrate 160 MG tablet TAKE 1 TABLET BY MOUTH DAILY 11/08/21   Zola Button, Grayling Congress, DO  hydrocortisone cream 1 % Apply to affected area 2 times daily 07/31/21   Redwine, Madison A, PA-C  levothyroxine (SYNTHROID) 112 MCG tablet TAKE 1 TABLET BY MOUTH DAILY  BEFORE BREAKFAST 03/16/22   Zola Button, Yvonne R, DO  lisinopril (ZESTRIL) 5 MG tablet TAKE 1 TABLET BY MOUTH DAILY 05/14/22   Zola Button, Grayling Congress, DO  moxifloxacin (VIGAMOX) 0.5 % ophthalmic solution Place 1 drop into the left eye 3 (three) times daily. 02/02/22   Donato Schultz, DO  Multiple Vitamin (MULTIVITAMIN) tablet Take 1 tablet by mouth daily.    [provider]  omeprazole (PRILOSEC) 40 MG capsule Take 40 mg by mouth daily. 05/06/21   [provider]  ONETOUCH ULTRA test strip USE AS DIRECTED UP TO 4 TIMES DAILY 10/04/20   Zola Button, Grayling Congress, DO  tirzepatide [facility]) 7.5  MG/0.5ML Pen Inject 7.5 mg into the skin once a week. 02/02/22   Zola Button, Yvonne R, DO  TRESIBA FLEXTOUCH 100 UNIT/ML FlexTouch Pen INJECT SUBCUTANEOUSLY 10 UNITS  DAILY 03/21/22   Zola Button, Grayling Congress, DO  venlafaxine (EFFEXOR) 37.5 MG tablet Take 1 tablet (37.5 mg total) by mouth daily. 01/30/22   Joan Flores, NP  venlafaxine XR (EFFEXOR-XR) 150 MG 24 hr capsule Take 1 capsule (150 mg total) by mouth daily. 01/30/22   Joan Flores, NP  Vitamin D, Ergocalciferol, (DRISDOL) 1.25 MG (50000 UNIT) CAPS capsule TAKE 1 CAPSULE BY MOUTH EVERY 7  DAYS 11/27/21   Donato Schultz, DO      Allergies    Patient has no known allergies.    Review of Systems   Review of Systems  Physical Exam Updated Vital Signs BP 135/61 (BP Location: Right Arm)   Pulse 65   Temp 98.1 F (36.7 C) (Oral)   Resp 18   Ht 5\' 5"  (1.651 m)   Wt 106.6 kg   SpO2 98%   BMI 39.11 kg/m  Physical Exam  ED Results / Procedures / Treatments   Labs (all labs ordered are listed, but only abnormal results are displayed) Labs Reviewed  COMPREHENSIVE METABOLIC PANEL - Abnormal; Notable for the following components:  Result Value   Glucose, Bld 110 (*)    AST 44 (*)    All other components within normal limits  CBC WITH DIFFERENTIAL/PLATELET    EKG None  Radiology CT Head Wo Contrast  Result Date: 06/19/2022 CLINICAL DATA:  72 year old female with left side numbness including the face upon waking today. Unintentional weight loss. EXAM: CT HEAD WITHOUT CONTRAST TECHNIQUE: Contiguous axial images were obtained from the base of the skull through the vertex without intravenous contrast. RADIATION DOSE REDUCTION: This exam was performed according to the departmental dose-optimization program which includes automated exposure control, adjustment of the mA and/or kV according to patient size and/or use of iterative reconstruction technique. COMPARISON:  Brain MRI 05/29/2006. FINDINGS: Brain: Cerebral volume is  within normal limits for age. No midline shift, ventriculomegaly, mass effect, evidence of mass lesion, intracranial hemorrhage or evidence of cortically based acute infarction. Supratentorial gray-white differentiation appears normal for age. Questionable asymmetric hypodensity in the right brainstem at the pons on series 2, image 8. No evidence of mass effect. But this could be artifactual. Elsewhere in the posterior fossa gray-white differentiation is within normal limits. Vascular: Calcified atherosclerosis at the skull base. No suspicious intracranial vascular hyperdensity. Negative noncontrast cavernous sinus. Skull: No acute osseous abnormality identified. No suspicious osseous lesion. TMJ degeneration. Sinuses/Orbits: Visualized paranasal sinuses and mastoids are clear. Other: Visualized orbits and scalp soft tissues are within normal limits. Stylomastoid foramina appear unremarkable. IMPRESSION: 1. Questionable hypodensity in the right pons, could be artifact. Brain MRI (without and with contrast preferred) would best evaluate further. 2. Otherwise negative noncontrast Head CT, normal for age appearance of the brain. Electronically Signed   By: Odessa Fleming M.D.   On: 06/19/2022 10:39    Procedures Procedures  {Document cardiac monitor, telemetry assessment procedure when appropriate:1}  Medications Ordered in ED Medications - No data to display  ED Course/ Medical Decision Making/ A&P   {   Click here for ABCD2, HEART and other calculatorsREFRESH Note before signing :1}                          Medical Decision Making Amount and/or Complexity of Data Reviewed Labs: ordered. Radiology: ordered.   ***  {Document critical care time when appropriate:1} {Document review of labs and clinical decision tools ie heart score, Chads2Vasc2 etc:1}  {Document your independent review of radiology images, and any outside records:1} {Document your discussion with family members, caretakers, and with  consultants:1} {Document social determinants of health affecting pt's care:1} {Document your decision making why or why not admission, treatments were needed:1} Final Clinical Impression(s) / ED Diagnoses Final diagnoses:  None    Rx / DC Orders ED Discharge Orders     None

## 2022-06-19 NOTE — ED Notes (Signed)
[facility] at bedside 

## 2022-06-19 NOTE — Telephone Encounter (Signed)
Initial Comment Patient is experiencing numbness in left side of face and in her left hand Translation No Nurse Assessment Nurse: Annye English, RN, Angelique Blonder Date/Time (Eastern Time): 06/19/2022 9:25:17 AM Confirm and document reason for call. If symptomatic, describe symptoms. ---Patient is experiencing numbness in left side of face and in her left hand Does the patient have any new or worsening symptoms? ---Yes Will a triage be completed? ---Yes Related visit to physician within the last 2 weeks? ---No Does the PT have any chronic conditions? (i.e. diabetes, asthma, this includes High risk factors for pregnancy, etc.) ---No Is this a behavioral health or substance abuse call? ---No Guidelines Guideline Title Affirmed Question Affirmed Notes Nurse Date/Time (Eastern Time) Neurologic Deficit [1] Numbness (i.e., loss of sensation) of the face, arm / hand, or leg / foot on one side of the body AND [2] sudden onset AND [3] present now [place], RN, Angelique Blonder 06/19/2022 9:25:57 AM Disp. Time Lamount Cohen Time) Disposition Final User 06/19/2022 9:21:58 AM Send to Urgent Ruta Hinds 06/19/2022 9:30:10 AM Call EMS 911 Now Yes Carmon, RN, Angelique Blonder PLEASE NOTE: All timestamps contained within this report are represented as [place] Standard Time. CONFIDENTIALTY NOTICE: This fax transmission is intended only for the addressee. It contains information that is legally privileged, confidential or otherwise protected from use or disclosure. If you are not the intended recipient, you are strictly prohibited from reviewing, disclosing, copying using or disseminating any of this information or taking any action in reliance on or regarding this information. If you have received this fax in error, please notify us immediately by telephone so that we can arrange for its return to [name]. Phone: [phone], Toll-Free: [phone], Fax: [phone] Page: 2 of 2 Call Id: [id number] Disp. Time Lamount Cohen Time)  Disposition Final User 06/19/2022 9:30:43 AM 911 Outcome Documentation Carmon, RN, Angelique Blonder Reason: Pt refused to call 911 and having someone take her to the ER now Final Disposition 06/19/2022 9:30:10 AM Call EMS 911 Now Yes Carmon, RN, Leighton Ruff Disagree/Comply Disagree Caller Understands Yes PreDisposition Call Doctor Care Advice Given Per Guideline CALL EMS 911 NOW: CARE ADVICE given per Neurologic Deficit (Adult) guideline. Referrals [facility] - ED

## 2022-06-19 NOTE — Telephone Encounter (Signed)
Pt in ED.  

## 2022-06-19 NOTE — Telephone Encounter (Signed)
Noted  

## 2022-06-20 ENCOUNTER — Observation Stay (HOSPITAL_BASED_OUTPATIENT_CLINIC_OR_DEPARTMENT_OTHER): Payer: Medicare Other

## 2022-06-20 ENCOUNTER — Observation Stay (HOSPITAL_COMMUNITY): Payer: Medicare Other

## 2022-06-20 ENCOUNTER — Encounter (HOSPITAL_COMMUNITY)
Admission: EM | Disposition: A | Discharge status: Home / Self Care | Payer: Self-pay | Source: Home / Self Care | Attending: Emergency Medicine

## 2022-06-20 DIAGNOSIS — E782 Mixed hyperlipidemia: Secondary | ICD-10-CM

## 2022-06-20 DIAGNOSIS — E1169 Type 2 diabetes mellitus with other specified complication: Secondary | ICD-10-CM

## 2022-06-20 DIAGNOSIS — I6350 Cerebral infarction due to unspecified occlusion or stenosis of unspecified cerebral artery: Secondary | ICD-10-CM

## 2022-06-20 DIAGNOSIS — F418 Other specified anxiety disorders: Secondary | ICD-10-CM | POA: Diagnosis not present

## 2022-06-20 DIAGNOSIS — E038 Other specified hypothyroidism: Secondary | ICD-10-CM

## 2022-06-20 DIAGNOSIS — I639 Cerebral infarction, unspecified: Secondary | ICD-10-CM

## 2022-06-20 DIAGNOSIS — I6340 Cerebral infarction due to embolism of unspecified cerebral artery: Secondary | ICD-10-CM

## 2022-06-20 DIAGNOSIS — I6389 Other cerebral infarction: Secondary | ICD-10-CM | POA: Diagnosis not present

## 2022-06-20 DIAGNOSIS — R29818 Other symptoms and signs involving the nervous system: Secondary | ICD-10-CM | POA: Diagnosis not present

## 2022-06-20 HISTORY — PX: LOOP RECORDER INSERTION: EP1214

## 2022-06-20 LAB — CBC
HCT: 41 % (ref 36.0–46.0)
Hemoglobin: 13.3 g/dL (ref 12.0–15.0)
MCH: 30.2 pg (ref 26.0–34.0)
MCHC: 32.4 g/dL (ref 30.0–36.0)
MCV: 93 fL (ref 80.0–100.0)
Platelets: 276 10*3/uL (ref 150–400)
RBC: 4.41 MIL/uL (ref 3.87–5.11)
RDW: 14.3 % (ref 11.5–15.5)
WBC: 5.4 10*3/uL (ref 4.0–10.5)
nRBC: 0 % (ref 0.0–0.2)

## 2022-06-20 LAB — LIPID PANEL
Cholesterol: 131 mg/dL (ref 0–200)
HDL: 27 mg/dL — ABNORMAL LOW (ref >40)
LDL Cholesterol: 82 mg/dL (ref 0–99)
Total CHOL/HDL Ratio: 4.9 RATIO
Triglycerides: 109 mg/dL (ref <150)
VLDL: 22 mg/dL (ref 0–40)

## 2022-06-20 LAB — BASIC METABOLIC PANEL
Anion gap: 6 (ref 5–15)
BUN: 16 mg/dL (ref 8–23)
CO2: 23 mmol/L (ref 22–32)
Calcium: 9.3 mg/dL (ref 8.9–10.3)
Chloride: 108 mmol/L (ref 98–111)
Creatinine, Ser: 0.74 mg/dL (ref 0.44–1.00)
GFR, Estimated: >60 mL/min (ref >60)
Glucose, Bld: 142 mg/dL — ABNORMAL HIGH (ref 70–99)
Potassium: 3.6 mmol/L (ref 3.5–5.1)
Sodium: 137 mmol/L (ref 135–145)

## 2022-06-20 LAB — ECHOCARDIOGRAM COMPLETE
AR max vel: 1.61 cm2
AV Area VTI: 1.62 cm2
AV Area mean vel: 1.46 cm2
AV Mean grad: 9 mmHg
AV Peak grad: 16.6 mmHg
Ao pk vel: 2.04 m/s
Area-P 1/2: 2.27 cm2
Height: 65 in
S' Lateral: 2.6 cm
Weight: 3760 oz

## 2022-06-20 LAB — GLUCOSE, CAPILLARY
Glucose-Capillary: 132 mg/dL — ABNORMAL HIGH (ref 70–99)
Glucose-Capillary: 126 mg/dL — ABNORMAL HIGH (ref 70–99)

## 2022-06-20 LAB — TSH: TSH: 5.844 u[IU]/mL — ABNORMAL HIGH (ref 0.350–4.500)

## 2022-06-20 SURGERY — LOOP RECORDER INSERTION

## 2022-06-20 MED ORDER — ASPIRIN 81 MG PO TBEC
81.0000 mg | DELAYED_RELEASE_TABLET | Freq: Every day | ORAL | Status: DC
  Administered 2022-06-20: 81 mg via ORAL
  Filled 2022-06-20: qty 1

## 2022-06-20 MED ORDER — ENOXAPARIN SODIUM 40 MG/0.4ML IJ SOSY
40.0000 mg | PREFILLED_SYRINGE | Freq: Every day | INTRAMUSCULAR | Status: DC
  Administered 2022-06-20: 40 mg via SUBCUTANEOUS
  Filled 2022-06-20: qty 0.4

## 2022-06-20 MED ORDER — CLOPIDOGREL BISULFATE 75 MG PO TABS: 75.0000 mg | ORAL_TABLET | Freq: Every day | ORAL | 0 refills | Status: AC

## 2022-06-20 MED ORDER — LIDOCAINE-EPINEPHRINE 1 %-1:100000 IJ SOLN
INTRAMUSCULAR | Status: AC
  Filled 2022-06-20: qty 1

## 2022-06-20 MED ORDER — ASPIRIN 81 MG PO TBEC: 81.0000 mg | DELAYED_RELEASE_TABLET | Freq: Every day | ORAL | 12 refills | Status: AC

## 2022-06-20 MED ORDER — CLOPIDOGREL BISULFATE 75 MG PO TABS: 75.0000 mg | ORAL_TABLET | Freq: Every day | ORAL | Status: DC

## 2022-06-20 MED ORDER — CLOPIDOGREL BISULFATE 75 MG PO TABS
300.0000 mg | ORAL_TABLET | Freq: Once | ORAL | Status: AC
  Administered 2022-06-20: 300 mg via ORAL
  Filled 2022-06-20: qty 4

## 2022-06-20 MED ORDER — LIDOCAINE-EPINEPHRINE 1 %-1:100000 IJ SOLN
INTRAMUSCULAR | Status: DC | PRN
  Administered 2022-06-20: 10 mL via INTRADERMAL

## 2022-06-20 MED ORDER — INSULIN ASPART 100 UNIT/ML IJ SOLN
0.0000 [IU] | Freq: Three times a day (TID) | INTRAMUSCULAR | Status: DC
  Administered 2022-06-20 (×2): 1 [IU] via SUBCUTANEOUS

## 2022-06-20 MED ORDER — GADOBUTROL 1 MMOL/ML IV SOLN
10.0000 mL | Freq: Once | INTRAVENOUS | Status: AC | PRN
  Administered 2022-06-20: 10 mL via INTRAVENOUS

## 2022-06-20 MED ORDER — ATORVASTATIN CALCIUM 40 MG PO TABS: 40.0000 mg | ORAL_TABLET | Freq: Every day | ORAL | Status: DC

## 2022-06-20 MED ORDER — ACETAMINOPHEN 650 MG RE SUPP: 650.0000 mg | Freq: Four times a day (QID) | RECTAL | Status: DC | PRN

## 2022-06-20 MED ORDER — IOHEXOL 350 MG/ML SOLN
75.0000 mL | Freq: Once | INTRAVENOUS | Status: AC | PRN
  Administered 2022-06-20: 75 mL via INTRAVENOUS

## 2022-06-20 MED ORDER — ACETAMINOPHEN 325 MG PO TABS: 650.0000 mg | ORAL_TABLET | Freq: Four times a day (QID) | ORAL | Status: DC | PRN

## 2022-06-20 MED ORDER — STROKE: EARLY STAGES OF RECOVERY BOOK: Freq: Once | Status: DC

## 2022-06-20 MED ORDER — INSULIN ASPART 100 UNIT/ML IJ SOLN: 0.0000 [IU] | Freq: Every day | INTRAMUSCULAR | Status: DC

## 2022-06-20 MED ORDER — ATORVASTATIN CALCIUM 80 MG PO TABS: 80.0000 mg | ORAL_TABLET | Freq: Every day | ORAL | 11 refills | Status: DC

## 2022-06-20 SURGICAL SUPPLY — 2 items
PACK LOOP INSERTION (CUSTOM PROCEDURE TRAY) ×1 IMPLANT
SYSTEM MONITOR REVEAL LINQ II (Prosthesis & Implant Heart) IMPLANT

## 2022-06-20 NOTE — Progress Notes (Signed)
Pt getting loop recorder - plan to discharge pt when return to the unit.

## 2022-06-20 NOTE — Progress Notes (Signed)
Order to discharge pt home.  Discharge instructions/AVS given to patient and reviewed - education provided as needed.  Pt advised to call PCP and/or come back to the hospital if there are any problems. Pt verbalized understanding.    

## 2022-06-20 NOTE — Plan of Care (Signed)
Problem: Education: Goal: Knowledge of General Education information will improve Description: Including pain rating scale, medication(s)/side effects and non-pharmacologic comfort measures 06/20/2022 1703 by Beryle Lathe, RN Outcome: Adequate for Discharge 06/20/2022 1429 by Maleeyah Mccaughey, Quitman Livings, RN Outcome: Progressing   Problem: Health Behavior/Discharge Planning: Goal: Ability to manage health-related needs will improve 06/20/2022 1703 by Leeta Grimme, Quitman Livings, RN Outcome: Adequate for Discharge 06/20/2022 1429 by Yasira Engelson, Quitman Livings, RN Outcome: Progressing   Problem: Clinical Measurements: Goal: Ability to maintain clinical measurements within normal limits will improve 06/20/2022 1703 by Maximiliano Cromartie, Quitman Livings, RN Outcome: Adequate for Discharge 06/20/2022 1429 by Caidan Hubbert, Quitman Livings, RN Outcome: Progressing Goal: Will remain free from infection 06/20/2022 1703 by Rhyan Radler, Quitman Livings, RN Outcome: Adequate for Discharge 06/20/2022 1429 by Beryle Lathe, RN Outcome: Progressing Goal: Diagnostic test results will improve 06/20/2022 1703 by Nyla Creason, Quitman Livings, RN Outcome: Adequate for Discharge 06/20/2022 1429 by Mckynzie Liwanag, Quitman Livings, RN Outcome: Progressing Goal: Respiratory complications will improve 06/20/2022 1703 by Beryle Lathe, RN Outcome: Adequate for Discharge 06/20/2022 1429 by ChrismonQuitman Livings, RN Outcome: Progressing Goal: Cardiovascular complication will be avoided 06/20/2022 1703 by Loura Pitt, Quitman Livings, RN Outcome: Adequate for Discharge 06/20/2022 1429 by Reece Fehnel, Quitman Livings, RN Outcome: Progressing   Problem: Activity: Goal: Risk for activity intolerance will decrease 06/20/2022 1703 by Donita Newland, Quitman Livings, RN Outcome: Adequate for Discharge 06/20/2022 1429 by Tudor Chandley, Quitman Livings, RN Outcome: Progressing   Problem: Nutrition: Goal: Adequate nutrition will be maintained 06/20/2022 1703 by Aubrey Blackard, Quitman Livings, RN Outcome: Adequate for Discharge 06/20/2022 1429 by Tita Terhaar, Quitman Livings, RN Outcome: Progressing   Problem: Coping: Goal: Level of anxiety will decrease 06/20/2022 1703 by Ichiro Chesnut, Quitman Livings, RN Outcome: Adequate for Discharge 06/20/2022 1429 by Raedyn Klinck, Quitman Livings, RN Outcome: Progressing   Problem: Elimination: Goal: Will not experience complications related to bowel motility 06/20/2022 1703 by Beryle Lathe, RN Outcome: Adequate for Discharge 06/20/2022 1429 by Kawon Willcutt, Quitman Livings, RN Outcome: Progressing Goal: Will not experience complications related to urinary retention 06/20/2022 1703 by Lacoya Wilbanks, Quitman Livings, RN Outcome: Adequate for Discharge 06/20/2022 1429 by Divya Munshi, Quitman Livings, RN Outcome: Progressing   Problem: Pain Managment: Goal: General experience of comfort will improve 06/20/2022 1703 by Amori Colomb, Quitman Livings, RN Outcome: Adequate for Discharge 06/20/2022 1429 by Coolidge Gossard, Quitman Livings, RN Outcome: Progressing   Problem: Safety: Goal: Ability to remain free from injury will improve 06/20/2022 1703 by Henli Hey, Quitman Livings, RN Outcome: Adequate for Discharge 06/20/2022 1429 by Latavion Halls, Quitman Livings, RN Outcome: Progressing   Problem: Skin Integrity: Goal: Risk for impaired skin integrity will decrease 06/20/2022 1703 by Cayle Cordoba, Quitman Livings, RN Outcome: Adequate for Discharge 06/20/2022 1429 by Estha Few, Quitman Livings, RN Outcome: Progressing   Problem: Education: Goal: Knowledge of disease or condition will improve 06/20/2022 1703 by Anique Beckley, Quitman Livings, RN Outcome: Adequate for Discharge 06/20/2022 1429 by Loanne Emery, Quitman Livings, RN Outcome: Progressing Goal: Knowledge of secondary prevention will improve (MUST DOCUMENT ALL) 06/20/2022 1703 by Beryle Lathe, RN Outcome: Adequate for Discharge 06/20/2022 1429 by Beryle Lathe, RN Outcome: Progressing Goal: Knowledge of patient specific risk factors will improve Loraine Leriche N/A or DELETE if not current risk factor) 06/20/2022 1703 by Beryle Lathe, RN Outcome: Adequate for Discharge 06/20/2022 1429 by Ronny Korff,  Quitman Livings, RN Outcome: Progressing   Problem: Ischemic Stroke/TIA Tissue Perfusion: Goal: Complications of ischemic stroke/TIA will be minimized 06/20/2022 1703 by Joyann Spidle, Quitman Livings, RN Outcome: Adequate for Discharge 06/20/2022 1429 by Kelis Plasse, Quitman Livings, RN  Outcome: Progressing   Problem: Coping: Goal: Will verbalize positive feelings about self 06/20/2022 1703 by Beryle Lathe, RN Outcome: Adequate for Discharge 06/20/2022 1429 by Uchenna Seufert, Quitman Livings, RN Outcome: Progressing Goal: Will identify appropriate support needs 06/20/2022 1703 by Oral Hallgren, Quitman Livings, RN Outcome: Adequate for Discharge 06/20/2022 1429 by Anaissa Macfadden, Quitman Livings, RN Outcome: Progressing   Problem: Health Behavior/Discharge Planning: Goal: Ability to manage health-related needs will improve 06/20/2022 1703 by Lio Wehrly, Quitman Livings, RN Outcome: Adequate for Discharge 06/20/2022 1429 by Nyzier Boivin, Quitman Livings, RN Outcome: Progressing Goal: Goals will be collaboratively established with patient/family 06/20/2022 1703 by Beryle Lathe, RN Outcome: Adequate for Discharge 06/20/2022 1429 by Petra Sargeant, Quitman Livings, RN Outcome: Progressing   Problem: Self-Care: Goal: Ability to participate in self-care as condition permits will improve 06/20/2022 1703 by Ogden Handlin, Quitman Livings, RN Outcome: Adequate for Discharge 06/20/2022 1429 by Orvill Coulthard, Quitman Livings, RN Outcome: Progressing Goal: Verbalization of feelings and concerns over difficulty with self-care will improve 06/20/2022 1703 by Mong Neal, Quitman Livings, RN Outcome: Adequate for Discharge 06/20/2022 1429 by Fiana Gladu, Quitman Livings, RN Outcome: Progressing Goal: Ability to communicate needs accurately will improve 06/20/2022 1703 by Makynli Stills, Quitman Livings, RN Outcome: Adequate for Discharge 06/20/2022 1429 by Tex Conroy, Quitman Livings, RN Outcome: Progressing   Problem: Nutrition: Goal: Risk of aspiration will decrease 06/20/2022 1703 by Brayden Brodhead, Quitman Livings, RN Outcome: Adequate for Discharge 06/20/2022 1429 by  Beryle Lathe, RN Outcome: Progressing Goal: Dietary intake will improve 06/20/2022 1703 by Nayara Taplin, Quitman Livings, RN Outcome: Adequate for Discharge 06/20/2022 1429 by Celestia Duva, Quitman Livings, RN Outcome: Progressing   Problem: Education: Goal: Ability to describe self-care measures that may prevent or decrease complications (Diabetes Survival Skills Education) will improve 06/20/2022 1703 by Beryle Lathe, RN Outcome: Adequate for Discharge 06/20/2022 1429 by Beryle Lathe, RN Outcome: Progressing Goal: Individualized Educational Video(s) 06/20/2022 1703 by Beryle Lathe, RN Outcome: Adequate for Discharge 06/20/2022 1429 by Beryle Lathe, RN Outcome: Progressing   Problem: Coping: Goal: Ability to adjust to condition or change in health will improve 06/20/2022 1703 by Ellison Rieth, Quitman Livings, RN Outcome: Adequate for Discharge 06/20/2022 1429 by Iman Orourke, Quitman Livings, RN Outcome: Progressing   Problem: Fluid Volume: Goal: Ability to maintain a balanced intake and output will improve 06/20/2022 1703 by Yatziry Deakins, Quitman Livings, RN Outcome: Adequate for Discharge 06/20/2022 1429 by Jernie Schutt, Quitman Livings, RN Outcome: Progressing   Problem: Health Behavior/Discharge Planning: Goal: Ability to identify and utilize available resources and services will improve 06/20/2022 1703 by Redell Bhandari, Quitman Livings, RN Outcome: Adequate for Discharge 06/20/2022 1429 by Dashonna Chagnon, Quitman Livings, RN Outcome: Progressing Goal: Ability to manage health-related needs will improve 06/20/2022 1703 by Ayleah Hofmeister, Quitman Livings, RN Outcome: Adequate for Discharge 06/20/2022 1429 by Ayman Brull, Quitman Livings, RN Outcome: Progressing   Problem: Metabolic: Goal: Ability to maintain appropriate glucose levels will improve 06/20/2022 1703 by Cheryllynn Sarff, Quitman Livings, RN Outcome: Adequate for Discharge 06/20/2022 1429 by Donshay Lupinski, Quitman Livings, RN Outcome: Progressing   Problem: Nutritional: Goal: Maintenance of adequate nutrition will improve 06/20/2022 1703  by Madden Piazza, Quitman Livings, RN Outcome: Adequate for Discharge 06/20/2022 1429 by Jayline Kilburg, Quitman Livings, RN Outcome: Progressing Goal: Progress toward achieving an optimal weight will improve 06/20/2022 1703 by Seiji Wiswell, Quitman Livings, RN Outcome: Adequate for Discharge 06/20/2022 1429 by Akshath Mccarey, Quitman Livings, RN Outcome: Progressing   Problem: Skin Integrity: Goal: Risk for impaired skin integrity will decrease 06/20/2022 1703 by Faithe Ariola, Quitman Livings, RN Outcome: Adequate for Discharge 06/20/2022 1429 by Dyron Kawano, Quitman Livings,  RN Outcome: Progressing   Problem: Tissue Perfusion: Goal: Adequacy of tissue perfusion will improve 06/20/2022 1703 by Chanay Nugent, Quitman Livings, RN Outcome: Adequate for Discharge 06/20/2022 1429 by Lakeesha Fontanilla, Quitman Livings, RN Outcome: Progressing

## 2022-06-20 NOTE — Evaluation (Signed)
Occupational Therapy Evaluation Patient Details Name: Amy Mahoney MRN: [id number] DOB: 1950/12/23 Today's Date: 06/20/2022   History of Present Illness Pt is a 72 y.o. F who presents 06/19/2022 with left sided facial numbness/tingling and left hand numbness. 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 Significant PMH: DM2, HTN, HLD, mild aortic stenosis, obesity, anxiety/depression, OSA on CPAP.   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 She as admitted for above and presents with problem list below.  Overall, pt completing functional mobility with supervision to modified independence, Adls with independence. During dual cognitive tasking in distracting environment, patient demonstrates decreased balance with 1 LOB to L side correcting with min guard to supervision and slowed sequencing/problem solving.  Pt reports different from baseline.  Discussed safety with IADL performance at home, recommending supervision for IADLs and assist with driving at this time- pt agreeable.  Will defer further OT services to neuro outpatient OT.  No further acute OT needs at this time.      Recommendations for follow up therapy are one component of a multi-disciplinary discharge planning process, led by the attending physician.  Recommendations may be updated based on patient status, additional functional criteria and insurance authorization.   Assistance Recommended at Discharge Intermittent Supervision/Assistance  Patient can return home with the following Assistance with cooking/housework;Direct supervision/assist for medications management;Direct supervision/assist for financial  management;Assist for transportation    Functional Status Assessment  Patient has had a recent decline in their functional status and demonstrates the ability to make significant improvements in function in a reasonable and predictable amount of time.  Equipment Recommendations  None recommended by OT    Recommendations for Other Services       Precautions / Restrictions Precautions Precautions: Fall Precaution Comments: mild fall risk Restrictions Weight Bearing Restrictions: No      Mobility Bed Mobility Overal bed mobility: Independent                  Transfers Overall transfer level: Independent                        Balance Overall balance assessment: Mild deficits observed, not formally tested                                         ADL either performed or assessed with clinical judgement   ADL Overall ADL's : Independent                                             Vision Baseline Vision/History: 1 Wears glasses Ability to See in Adequate Light: 0 Adequate Vision Assessment?: No apparent visual deficits     Perception     Praxis      Pertinent Vitals/Pain Pain Assessment Pain Assessment: No/denies pain     Hand Dominance Left   Extremity/Trunk Assessment Upper Extremity Assessment Upper Extremity Assessment: Overall WFL for tasks assessed   Lower Extremity Assessment Lower Extremity Assessment: RLE deficits/detail;LLE deficits/detail  RLE Deficits / Details: Strength 5/5 RLE Sensation: WNL LLE Deficits / Details: Strength 5/5 LLE Sensation: WNL   Cervical / Trunk Assessment Cervical / Trunk Assessment: Normal   Communication Communication Communication: HOH   Cognition Arousal/Alertness: Awake/alert Behavior During Therapy: WFL for tasks assessed/performed Overall Cognitive Status: Impaired/Different from baseline Area of Impairment: Problem solving, Attention                    Current Attention Level: Alternating         Problem Solving: Slow processing General Comments: overall WFL, patient with some decreased speed and balance deficits during dual cognitive tasks.  discussed safety with IADL supervision and no driving at this time.- pt agreeable     General Comments  supervision with 1 LOB in hallway during dual cognitive tasks; in room focused mobility with independence.    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, has small dog          OT Problem List: Decreased cognition;Impaired balance (sitting and/or standing)      OT Treatment/Interventions:      OT Goals(Current goals can be found in the care plan section) Acute Rehab OT Goals Patient Stated Goal: get back to normal OT Goal Formulation: With patient  OT Frequency:      Co-evaluation              AM-PAC OT "6 Clicks" Daily Activity     Outcome Measure Help from another person eating meals?: None Help from another person taking care of personal grooming?: None Help from another person toileting, which includes using toliet, bedpan, or urinal?: None Help from another person bathing (including washing, rinsing, drying)?: None Help from another person to put on and taking off  regular upper body clothing?: None Help from another person to put on and taking off regular lower body clothing?: None 6 Click Score: 24   End of Session Nurse Communication: Mobility status  Activity Tolerance: Patient tolerated treatment well Patient left: with call bell/phone within reach;Other (comment) (sitting EOB with PT)  OT Visit Diagnosis: Other abnormalities of gait and mobility (R26.89);Other symptoms and signs involving cognitive function                Time: 1048-1110 OT Time Calculation (min): 22 min Charges:  OT General Charges $OT Visit: 1 Visit OT Evaluation $OT Eval Low Complexity: 1 Low  Barry Brunner, OT Acute Rehabilitation Services Office [phone]   Chancy Milroy 06/20/2022, 12:12 PM

## 2022-06-20 NOTE — Consult Note (Signed)
Neurology Consultation Reason for Consult: Left-sided numbness Requesting Physician: Ramiro Harvest  CC: Transient numbness  History is obtained from: Patient and chart review  HPI: Amy Mahoney is a 72 y.o. left-handed woman with a past medical history significant for type 2 diabetes, hypertension, hyperlipidemia, obstructive sleep apnea on CPAP, hypothyroidism, obesity with BMI 39.11, anxiety/depression  She reports she was in her usual state of health recently when she awoke on Monday morning with right hand numbness that gradually improved over the course of the day.  However that evening she developed numbness in her left hand and the left perioral area of her face only.  She denies any other focal neurological symptoms noting that she and a family member both did stroke screen on her with no other speech issues, weakness, numbness etc.  At the time of my evaluation her numbness is all but resolved with some mild residual numbness in her left periorbital area  Head CT was concerning for possible pontine hypodensity and she was admitted for further evaluation  LKW: Sunday evening Thrombolytic given?: No, too mild to treat and out of the window Premorbid modified rankin scale:      0 - No symptoms.  ROS: All other review of systems was negative except as noted in the HPI.   Past Medical History:  Diagnosis Date  . Abnormal esophagram   . Abscess of skin 04/15/2015  . Anxiety   . Back pain   . Bacterial vaginal infection 12/17/2014  . Bronchitis with bronchospasm 04/14/2013  . Chest heaviness 07/11/2020  . Class 3 severe obesity with serious comorbidity and body mass index (BMI) of 40.0 to 44.9 in adult [facility]) 06/10/2018  . Constipation   . Cough 09/17/2013  . DEPRESSION 09/05/2006   Qualifier: Diagnosis of  By: Janit Bern    . Depression with anxiety 07/11/2020  . Diabetes mellitus without complication (HCC)   . DIZZINESS, CHRONIC 05/23/2006   Qualifier: Diagnosis of  By: Janit Bern    . Dysphagia   . FATIGUE 06/01/2009   Qualifier: Diagnosis of  By: Janit Bern    . Gallbladder problem   . Gastroesophageal reflux disease 05/24/2017  . Heart murmur   . Hyperlipidemia   . Hyperlipidemia associated with type 2 diabetes mellitus (HCC) 07/11/2020  . Hyperlipidemia LDL goal <100 11/06/2012  . Hypothyroidism 05/23/2006   Qualifier: Diagnosis of  By: Janit Bern    . Lichen planus 12/01/2020  . MORBID OBESITY 03/30/2008   Qualifier: Diagnosis of  By: Janit Bern    . Muscle spasm of back 11/19/2014  . Obstructive sleep apnea 05/23/2006   NPSG 08/03/94 RDI/AHI 49/hr, weight was 285 pounds CPAP/ Advanced   . Paronychia of index finger 08/08/2021  . Pharyngitis 03/03/2017  . Post-nasal drainage 08/03/2019   Last Assessment & Plan:  Formatting of this note might be different from the original. Concern over postnasal drainage. Chronic at least a year history of the above but over the last month seems to have worsened.  She has tried antihistamine therapy without improvement.  Denies obvious heartburn.  Some associated cough and occasional near emesis episodes.  She is not on an ACE inhibitor. EXAM show  . Prediabetes   . Preventative health care 01/10/2021  . Primary hypertension 07/11/2020  . Spider bite   . Subungual hematoma of fifth toe of right foot 10/29/2017  . Swelling   . Type 2 diabetes mellitus with hyperglycemia (HCC) 06/10/2018   Past  Surgical History:  Procedure Laterality Date  . CHOLECYSTECTOMY    . DILATION AND CURETTAGE OF UTERUS    . ESOPHAGOGASTRODUODENOSCOPY (EGD) WITH PROPOFOL N/A 09/05/2017   Procedure: ESOPHAGOGASTRODUODENOSCOPY (EGD) WITH PROPOFOL;  Surgeon: Beverley Fiedler, MD;  Location: [facility];  Service: Gastroenterology;  Laterality: N/A;  . fibroids scrapping    . TONSILLECTOMY     Current Outpatient Medications  Medication Instructions  . atorvastatin (LIPITOR) 40 MG tablet TAKE 1 TABLET BY MOUTH DAILY  . B-D UF III MINI PEN  NEEDLES 31G X 5 MM MISC See admin instructions  . Calcium Carbonate-Vitamin D (CALCIUM 500 + D PO) 1 tablet, Oral, 2 times daily,    . clonazePAM (KLONOPIN) 0.5 mg, Oral, 2 times daily PRN  . fenofibrate 160 MG tablet TAKE 1 TABLET BY MOUTH DAILY  . hydrocortisone cream 1 % Apply to affected area 2 times daily  . levothyroxine (SYNTHROID) 112 mcg, Oral, Daily before breakfast  . lisinopril (ZESTRIL) 5 MG tablet TAKE 1 TABLET BY MOUTH DAILY  . moxifloxacin (VIGAMOX) 0.5 % ophthalmic solution 1 drop, Left Eye, 3 times daily  . Multiple Vitamin (MULTIVITAMIN) tablet 1 tablet, Oral, Daily,    . omeprazole (PRILOSEC) 40 mg, Oral, Daily  . ONETOUCH ULTRA test strip USE AS DIRECTED UP TO 4 TIMES DAILY  . tirzepatide [facility]) 7.5 mg, Subcutaneous, Weekly  . TRESIBA FLEXTOUCH 100 UNIT/ML FlexTouch Pen INJECT SUBCUTANEOUSLY 10 UNITS  DAILY  . venlafaxine (EFFEXOR) 37.5 mg, Oral, Daily  . venlafaxine XR (EFFEXOR-XR) 150 mg, Oral, Daily  . Vitamin D, Ergocalciferol, (DRISDOL) 1.25 MG (50000 UNIT) CAPS capsule TAKE 1 CAPSULE BY MOUTH EVERY 7  DAYS     Family History  Problem Relation Age of Onset  . Diabetes Mother   . Thyroid disease Mother   . Depression Mother   . Anxiety disorder Brother   . Breast cancer Paternal Grandmother   . Diabetes Maternal Aunt   . Lung cancer Maternal Aunt        Nonsmoker  . Cancer Maternal Aunt        breast  . Breast cancer Maternal Aunt   . Cancer Maternal Aunt        lung(nonsmoker)  . Depression Other   . Hyperlipidemia Other   . Arthritis Other   . Colon cancer Neg Hx   . Pancreatic cancer Neg Hx   . Stomach cancer Neg Hx   . Esophageal cancer Neg Hx   . Rectal cancer Neg Hx      Social History:  reports that she quit smoking about 31 years ago. Her smoking use included cigarettes. She has a 1.50 pack-year smoking history. She has never used smokeless tobacco. She reports current alcohol use. She reports that she does not use  drugs.   Exam: Current vital signs: BP 119/65 (BP Location: Left Arm)   Pulse 66   Temp 98.7 F (37.1 C) (Oral)   Resp 18   Ht 5\' 5"  (1.651 m)   Wt 106.6 kg   SpO2 94%   BMI 39.11 kg/m  Vital signs in last 24 hours: Temp:  [97.6 F (36.4 C)-98.7 F (37.1 C)] 98.7 F (37.1 C) (04/30 2248) Pulse Rate:  [65-84] 66 (05/01 0411) Resp:  [15-21] 18 (05/01 0411) BP: (91-150)/(61-101) 119/65 (05/01 0411) SpO2:  [94 %-99 %] 94 % (05/01 0411) Weight:  [106.6 kg] 106.6 kg (04/30 0956)   Physical Exam  Constitutional: Appears well-developed and well-nourished.  Psych: Affect pleasant and cooperative  Eyes: No scleral injection HENT: No oropharyngeal obstruction.  MSK: no major joint deformities.  Cardiovascular: Perfusing extremities well Respiratory: Effort normal, non-labored breathing GI: Soft.  No distension. There is no tenderness.  Skin: Warm dry and intact visible skin  Neuro: Mental Status: Patient is awake, alert, oriented to person, place, month, year, and situation. Patient is able to give a clear and coherent history. No signs of aphasia or neglect Cranial Nerves: II: Visual Fields are full. Pupils are equal, round, and reactive to light.   III,IV, VI: EOMI without ptosis or diploplia.  V: Facial sensation is symmetric to temperature and light touch in V1, V2, V3 other than very slight numbness in the left periorbital area VII: Facial movement is symmetric.  VIII: hearing is intact to voice X: Uvula elevates symmetrically XI: Shoulder shrug is symmetric. XII: tongue is midline without atrophy or fasciculations.  Motor: Tone is normal. Bulk is normal. 5/5 strength was present in all four extremities, other than very mild hip flexion weakness.  Sensory: Sensation is symmetric to light touch and temperature in the arms and legs. Deep Tendon Reflexes: 2+ and symmetric in the brachioradialis and patellae.  Cerebellar: FNF and HKS are intact bilaterally Gait:   Deferred in acute setting   NIHSS total 1 Score breakdown: Facial numbness  I have reviewed labs in epic and the results pertinent to this consultation are:  Basic Metabolic Panel: Recent Labs  Lab 06/19/22 1021  NA 138  K 5.0  CL 106  CO2 26  GLUCOSE 110*  BUN 20  CREATININE 0.92  CALCIUM 9.3    CBC: Recent Labs  Lab 06/19/22 1021  WBC 5.6  NEUTROABS 2.6  HGB 12.9  HCT 40.3  MCV 93.5  PLT 265    Coagulation Studies: No results for input(s): "LABPROT", "INR" in the last 72 hours.    I have reviewed the images obtained:    Impression: ***  Recommendations: - ***   Brooke Dare MD-PhD [facility] [phone]   *** [facility], [facility], Teleneuro    Total critical care time: *** minutes   Critical care time was exclusive of separately billable procedures and treating other patients.   Critical care was necessary to treat or prevent imminent or life-threatening deterioration.   Critical care was time spent personally by me on the following activities: development of treatment plan with patient and/or surrogate as well as nursing, discussions with consultants/primary team, evaluation of patient's response to treatment, examination of patient, obtaining history from patient or surrogate, ordering and performing treatments and interventions, ordering and review of laboratory studies, ordering and review of radiographic studies, and re-evaluation of patient's condition as needed, as documented above.

## 2022-06-20 NOTE — Progress Notes (Signed)
Bilateral lower extremity venous study completed.   Preliminary results relayed to RN.  Please see CV Procedures for preliminary results.  Jovonne Wilton, RVT  2:06 PM 06/20/22

## 2022-06-20 NOTE — Consult Note (Addendum)
ELECTROPHYSIOLOGY CONSULT NOTE  Patient ID: Amy Mahoney MRN: [id number], DOB/AGE: 73-Feb-1952   Admit date: 06/19/2022 Date of Consult: 06/20/2022  Primary Physician: Zola Button, Grayling Congress, DO Primary Cardiologist: None  Primary Electrophysiologist: New to Dr. [name] Fortis Reason for Consultation: Cryptogenic stroke; recommendations regarding Implantable Loop Recorder Insurance: [facility] Medicare  History of Present Illness EP has been asked to evaluate Amy Mahoney for placement of an implantable loop recorder to monitor for atrial fibrillation by Dr Roda Shutters.  The patient was admitted on 06/19/2022 with left sided facial numbness/tingling and left handed numbness.    Imaging demonstrated ? Hypodensity in the right pons. Neurology felt consistent with CVA.     She has undergone workup for stroke including:  Code Stroke CT head Questionable hypodensity in the right pons, could be artifact.  CTA - No large vessel occlusion or proximal hemodynamically significant stenosis.  MRI  - Patchy acute small vessel infarct in the LEFT pons, but also small acute lacunar infarct of the Right Thalamus, and additional recent punctate ischemia also suspected in the anterior right frontal lobe and left caudate nucleus. This multiple vascular territory  involvement raises suspicion of recent embolic event. Synchronous small vessel disease might also have this appearance - but little to no evidence of chronic small vessel ischemia 2D Echo LVEF 65-70, hyperdynamic RV DVT US negative LDL 64 HgbA1c 6.2 aspirin 81 mg daily prior to admission, now on clopidogrel 75 mg daily.  Therapy recommendations:  None Disposition:  Home today    The patient has been monitored on telemetry which has demonstrated sinus rhythm with no arrhythmias.  Inpatient stroke work-up Kazi Reppond not require a TEE per Neurology.   Echocardiogram as above. Lab work is reviewed.  Prior to admission, the patient denies chest pain, shortness of breath,  dizziness, palpitations, or syncope.  She is recovering from her stroke with plans to return home  at discharge.  Allergies, Past Medical, Surgical, Social, and Family Histories have been reviewed and are referenced here-in when relevant for medical decision making.   Inpatient Medications:   [START ON 06/21/2022]  stroke: early stages of recovery book   Does not apply Once   aspirin EC  81 mg Oral Daily   atorvastatin  40 mg Oral QHS   [START ON 06/21/2022] clopidogrel  75 mg Oral Daily   enoxaparin (LOVENOX) injection  40 mg Subcutaneous Daily   insulin aspart  0-5 Units Subcutaneous QHS   insulin aspart  0-9 Units Subcutaneous TID WC    Physical Exam: Vitals:   06/19/22 2248 06/20/22 0411 06/20/22 0807 06/20/22 1150  BP: (!) 150/100 119/65 120/72 125/60  Pulse: 84 66 66 70  Resp: 18 18 18 18   Temp: 98.7 F (37.1 C) 98.1 F (36.7 C) 98.2 F (36.8 C) 98.1 F (36.7 C)  TempSrc: Oral Oral Oral Oral  SpO2: 98% 94% 99% 96%  Weight:      Height:        GEN- NAD. A&O x 3. Normal affect. HEENT: Normocephalic, atraumatic Lungs- CTAB, Normal effort.  Heart- Regular rate and rhythm rate and rhythm. No M/G/R.  Extremities- No peripheral edema. no clubbing or cyanosis Skin- warm and dry, no rash or lesion. Neuro - No focal deficits   12-lead ECG on arrival showed NSR at 63 bpm (personally reviewed) All prior EKG's in EPIC reviewed with no documented atrial fibrillation  Telemetry NSR 60-80s (personally reviewed)  Assessment and Plan:  1. Cryptogenic stroke The patient presents with  cryptogenic stroke.  The patient does not have a TEE planned for this AM.  I spoke at length with the patient about monitoring for afib with an implantable loop recorder.  Risks, benefits, and alteratives to implantable loop recorder were discussed with the patient today.   At this time, the patient is very clear in their decision to proceed with implantable loop recorder.    Wound care was reviewed with  the patient (keep incision clean and dry for 3 days). Please call with questions.   [name], PA-C 06/20/2022 2:04 PM   I have seen and examined this patient with [name].  Agree with above, note added to reflect my findings.  On exam, RRR, no murmurs, lungs clear.  Patient presented to the hospital with cryptogenic stroke. To date, no cause has been found. [name] plan for LINQ monitor to look for atrial fibrillation. Risks and benefits discussed. Risks include but not limited to bleeding and infection. The patient understands the risks and has agreed to the procedure.  Aashir Umholtz M. Aranda Bihm MD 06/20/2022 3:38 PM

## 2022-06-20 NOTE — Plan of Care (Signed)
  Problem: Education: Goal: Knowledge of General Education information will improve Description: Including pain rating scale, medication(s)/side effects and non-pharmacologic comfort measures Outcome: Progressing   Problem: Health Behavior/Discharge Planning: Goal: Ability to manage health-related needs will improve Outcome: Progressing   Problem: Clinical Measurements: Goal: Ability to maintain clinical measurements within normal limits will improve Outcome: Progressing Goal: Will remain free from infection Outcome: Progressing Goal: Diagnostic test results will improve Outcome: Progressing Goal: Respiratory complications will improve Outcome: Progressing Goal: Cardiovascular complication will be avoided Outcome: Progressing   Problem: Activity: Goal: Risk for activity intolerance will decrease Outcome: Progressing   Problem: Nutrition: Goal: Adequate nutrition will be maintained Outcome: Progressing   Problem: Coping: Goal: Level of anxiety will decrease Outcome: Progressing   Problem: Elimination: Goal: Will not experience complications related to bowel motility Outcome: Progressing Goal: Will not experience complications related to urinary retention Outcome: Progressing   Problem: Pain Managment: Goal: General experience of comfort will improve Outcome: Progressing   Problem: Safety: Goal: Ability to remain free from injury will improve Outcome: Progressing   Problem: Skin Integrity: Goal: Risk for impaired skin integrity will decrease Outcome: Progressing   Problem: Education: Goal: Knowledge of disease or condition will improve Outcome: Progressing Goal: Knowledge of secondary prevention will improve (MUST DOCUMENT ALL) Outcome: Progressing Goal: Knowledge of patient specific risk factors will improve (Mark N/A or DELETE if not current risk factor) Outcome: Progressing   Problem: Ischemic Stroke/TIA Tissue Perfusion: Goal: Complications of ischemic  stroke/TIA will be minimized Outcome: Progressing   Problem: Coping: Goal: Will verbalize positive feelings about self Outcome: Progressing Goal: Will identify appropriate support needs Outcome: Progressing   Problem: Health Behavior/Discharge Planning: Goal: Ability to manage health-related needs will improve Outcome: Progressing Goal: Goals will be collaboratively established with patient/family Outcome: Progressing   Problem: Self-Care: Goal: Ability to participate in self-care as condition permits will improve Outcome: Progressing Goal: Verbalization of feelings and concerns over difficulty with self-care will improve Outcome: Progressing Goal: Ability to communicate needs accurately will improve Outcome: Progressing   Problem: Nutrition: Goal: Risk of aspiration will decrease Outcome: Progressing Goal: Dietary intake will improve Outcome: Progressing   Problem: Education: Goal: Ability to describe self-care measures that may prevent or decrease complications (Diabetes Survival Skills Education) will improve Outcome: Progressing Goal: Individualized Educational Video(s) Outcome: Progressing   Problem: Coping: Goal: Ability to adjust to condition or change in health will improve Outcome: Progressing   Problem: Fluid Volume: Goal: Ability to maintain a balanced intake and output will improve Outcome: Progressing   Problem: Health Behavior/Discharge Planning: Goal: Ability to identify and utilize available resources and services will improve Outcome: Progressing Goal: Ability to manage health-related needs will improve Outcome: Progressing   Problem: Metabolic: Goal: Ability to maintain appropriate glucose levels will improve Outcome: Progressing   Problem: Nutritional: Goal: Maintenance of adequate nutrition will improve Outcome: Progressing Goal: Progress toward achieving an optimal weight will improve Outcome: Progressing   Problem: Skin  Integrity: Goal: Risk for impaired skin integrity will decrease Outcome: Progressing   Problem: Tissue Perfusion: Goal: Adequacy of tissue perfusion will improve Outcome: Progressing   

## 2022-06-20 NOTE — H&P (Signed)
History and Physical    Amy Mahoney OZD:[id number] DOB: Jul 01, 1950 DOA: 06/19/2022  PCP: Donato Schultz, DO  Patient coming from: [facility] ED  Chief Complaint: Facial/hand numbness  HPI: Amy Mahoney is a 72 y.o. female with medical history significant of type 2 diabetes, hypertension, hyperlipidemia, hypothyroidism, mild aortic stenosis obesity (BMI 39.11), anxiety/depression, GERD, OSA on CPAP presented to ED with left-sided facial numbness/tingling and left hand numbness.  Vital signs stable.  CBC unremarkable.  CMP showing borderline elevated potassium at 5.0 and borderline elevated AST at 44 (was slightly elevated on labs done 3 weeks ago as well) but otherwise remainder of LFTs normal and no other significant abnormalities.  CT head showing questionable hypodensity in the right pons. ED physician spoke to Dr. Otelia Limes with neurology who felt that CT head findings were concerning for an acute stroke and recommended admission for stroke workup.  Patient states Monday 4/29 in the morning she started having tingling in her right hand which persisted throughout the day and then later in the evening the same day her left hand started tingling and she had numbness on the left side of her mouth.  Symptoms have now resolved.  She did not have any drooping of her face, difficulty with speech, vision changes, or upper/lower extremity weakness.  She had no difficulty gripping objects with her hands.  Patient reports chronic cough secondary to allergies/postnasal drip.  Denies fevers, shortness of breath, chest pain, nausea, vomiting, abdominal pain, diarrhea, or any urinary symptoms.  She denies history of prior stroke.  Review of Systems:  Review of Systems  All other systems reviewed and are negative.   Past Medical History:  Diagnosis Date   Abnormal esophagram    Abscess of skin 04/15/2015   Anxiety    Back pain    Bacterial vaginal infection 12/17/2014   Bronchitis with bronchospasm  04/14/2013   Chest heaviness 07/11/2020   Class 3 severe obesity with serious comorbidity and body mass index (BMI) of 40.0 to 44.9 in adult [facility]) 06/10/2018   Constipation    Cough 09/17/2013   DEPRESSION 09/05/2006   Qualifier: Diagnosis of  By: Janit Bern     Depression with anxiety 07/11/2020   Diabetes mellitus without complication (HCC)    DIZZINESS, CHRONIC 05/23/2006   Qualifier: Diagnosis of  By: Janit Bern     Dysphagia    FATIGUE 06/01/2009   Qualifier: Diagnosis of  By: Janit Bern     Gallbladder problem    Gastroesophageal reflux disease 05/24/2017   Heart murmur    Hyperlipidemia    Hyperlipidemia associated with type 2 diabetes mellitus (HCC) 07/11/2020   Hyperlipidemia LDL goal <100 11/06/2012   Hypothyroidism 05/23/2006   Qualifier: Diagnosis of  By: Janit Bern     Lichen planus 12/01/2020   MORBID OBESITY 03/30/2008   Qualifier: Diagnosis of  By: Janit Bern     Muscle spasm of back 11/19/2014   Obstructive sleep apnea 05/23/2006   NPSG 08/03/94 RDI/AHI 49/hr, weight was 285 pounds CPAP/ Advanced    Paronychia of index finger 08/08/2021   Pharyngitis 03/03/2017   Post-nasal drainage 08/03/2019   Last Assessment & Plan:  Formatting of this note might be different from the original. Concern over postnasal drainage. Chronic at least a year history of the above but over the last month seems to have worsened.  She has tried antihistamine therapy without improvement.  Denies obvious heartburn.  Some associated cough and  occasional near emesis episodes.  She is not on an ACE inhibitor. EXAM show   Prediabetes    Preventative health care 01/10/2021   Primary hypertension 07/11/2020   Spider bite    Subungual hematoma of fifth toe of right foot 10/29/2017   Swelling    Type 2 diabetes mellitus with hyperglycemia (HCC) 06/10/2018    Past Surgical History:  Procedure Laterality Date   CHOLECYSTECTOMY     DILATION AND CURETTAGE OF UTERUS      ESOPHAGOGASTRODUODENOSCOPY (EGD) WITH PROPOFOL N/A 09/05/2017   Procedure: ESOPHAGOGASTRODUODENOSCOPY (EGD) WITH PROPOFOL;  Surgeon: Beverley Fiedler, MD;  Location: [facility] ENDOSCOPY;  Service: Gastroenterology;  Laterality: N/A;   fibroids scrapping     TONSILLECTOMY       reports that she quit smoking about 31 years ago. Her smoking use included cigarettes. She has a 1.50 pack-year smoking history. She has never used smokeless tobacco. She reports current alcohol use. She reports that she does not use drugs.  No Known Allergies  Family History  Problem Relation Age of Onset   Diabetes Mother    Thyroid disease Mother    Depression Mother    Anxiety disorder Brother    Breast cancer Paternal Grandmother    Diabetes Maternal Aunt    Lung cancer Maternal Aunt        Nonsmoker   Cancer Maternal Aunt        breast   Breast cancer Maternal Aunt    Cancer Maternal Aunt        lung(nonsmoker)   Depression Other    Hyperlipidemia Other    Arthritis Other    Colon cancer Neg Hx    Pancreatic cancer Neg Hx    Stomach cancer Neg Hx    Esophageal cancer Neg Hx    Rectal cancer Neg Hx     Prior to Admission medications   Medication Sig Start Date End Date Taking? Authorizing Provider  atorvastatin (LIPITOR) 40 MG tablet TAKE 1 TABLET BY MOUTH DAILY 06/19/22   Donato Schultz, DO  B-D UF III MINI PEN NEEDLES 31G X 5 MM MISC USE AS DIRECTED 09/11/21   Zola Button, Grayling Congress, DO  Calcium Carbonate-Vitamin D (CALCIUM 500 + D PO) Take 1 tablet by mouth 2 (two) times daily.    [provider]  clonazePAM (KLONOPIN) 0.5 MG tablet TAKE 1 TABLET BY MOUTH 2 TIMES DAILY AS NEEDED FOR ANXIETY. 05/01/22   Joan Flores, NP  fenofibrate 160 MG tablet TAKE 1 TABLET BY MOUTH DAILY 11/08/21   Zola Button, Grayling Congress, DO  hydrocortisone cream 1 % Apply to affected area 2 times daily 07/31/21   Redwine, Madison A, PA-C  levothyroxine (SYNTHROID) 112 MCG tablet TAKE 1 TABLET BY MOUTH DAILY  BEFORE  BREAKFAST 03/16/22   Zola Button, Yvonne R, DO  lisinopril (ZESTRIL) 5 MG tablet TAKE 1 TABLET BY MOUTH DAILY 05/14/22   Zola Button, Grayling Congress, DO  moxifloxacin (VIGAMOX) 0.5 % ophthalmic solution Place 1 drop into the left eye 3 (three) times daily. 02/02/22   Donato Schultz, DO  Multiple Vitamin (MULTIVITAMIN) tablet Take 1 tablet by mouth daily.    [provider]  omeprazole (PRILOSEC) 40 MG capsule Take 40 mg by mouth daily. 05/06/21   [provider]  ONETOUCH ULTRA test strip USE AS DIRECTED UP TO 4 TIMES DAILY 10/04/20   Zola Button, Grayling Congress, DO  tirzepatide [facility]) 7.5 MG/0.5ML Pen Inject 7.5 mg into the  skin once a week. 02/02/22   Zola Button, Yvonne R, DO  TRESIBA FLEXTOUCH 100 UNIT/ML FlexTouch Pen INJECT SUBCUTANEOUSLY 10 UNITS  DAILY 03/21/22   Zola Button, Grayling Congress, DO  venlafaxine (EFFEXOR) 37.5 MG tablet Take 1 tablet (37.5 mg total) by mouth daily. 01/30/22   Joan Flores, NP  venlafaxine XR (EFFEXOR-XR) 150 MG 24 hr capsule Take 1 capsule (150 mg total) by mouth daily. 01/30/22   Joan Flores, NP  Vitamin D, Ergocalciferol, (DRISDOL) 1.25 MG (50000 UNIT) CAPS capsule TAKE 1 CAPSULE BY MOUTH EVERY 7  DAYS 11/27/21   Donato Schultz, DO    Physical Exam: Vitals:   06/19/22 1930 06/19/22 2000 06/19/22 2248 06/20/22 0411  BP: (!) 117/101 121/77 (!) 150/100 119/65  Pulse: 69 76 84 66  Resp: 19 16 18 18   Temp:   98.7 F (37.1 C)   TempSrc:   Oral   SpO2: 98% 94% 98% 94%  Weight:      Height:        Physical Exam Vitals reviewed.  Constitutional:      General: She is not in acute distress. HENT:     Head: Normocephalic and atraumatic.  Eyes:     Extraocular Movements: Extraocular movements intact.  Cardiovascular:     Rate and Rhythm: Normal rate and regular rhythm.     Pulses: Normal pulses.     Heart sounds: Murmur heard.  Pulmonary:     Effort: Pulmonary effort is normal. No respiratory distress.     Breath sounds: Normal  breath sounds. No wheezing or rales.  Abdominal:     General: Bowel sounds are normal. There is no distension.     Palpations: Abdomen is soft.     Tenderness: There is no abdominal tenderness.  Musculoskeletal:     Cervical back: Normal range of motion.     Right lower leg: No edema.     Left lower leg: No edema.  Skin:    General: Skin is warm and dry.  Neurological:     General: No focal deficit present.     Mental Status: She is alert and oriented to person, place, and time.     Cranial Nerves: No cranial nerve deficit.     Sensory: No sensory deficit.     Motor: No weakness.     Labs on Admission: I have personally reviewed following labs and imaging studies  CBC: Recent Labs  Lab 06/19/22 1021  WBC 5.6  NEUTROABS 2.6  HGB 12.9  HCT 40.3  MCV 93.5  PLT 265   Basic Metabolic Panel: Recent Labs  Lab 06/19/22 1021  NA 138  K 5.0  CL 106  CO2 26  GLUCOSE 110*  BUN 20  CREATININE 0.92  CALCIUM 9.3   GFR: Estimated Creatinine Clearance: 67 mL/min (by C-G formula based on SCr of 0.92 mg/dL). Liver Function Tests: Recent Labs  Lab 06/19/22 1021  AST 44*  ALT 28  ALKPHOS 49  BILITOT 0.5  PROT 7.1  ALBUMIN 3.9   No results for input(s): "LIPASE", "AMYLASE" in the last 168 hours. No results for input(s): "AMMONIA" in the last 168 hours. Coagulation Profile: No results for input(s): "INR", "PROTIME" in the last 168 hours. Cardiac Enzymes: No results for input(s): "CKTOTAL", "CKMB", "CKMBINDEX", "TROPONINI" in the last 168 hours. BNP (last 3 results) No results for input(s): "PROBNP" in the last 8760 hours. HbA1C: No results for input(s): "HGBA1C" in the last 72 hours. CBG: Recent  Labs  Lab 06/19/22 1201  GLUCAP 81   Lipid Profile: No results for input(s): "CHOL", "HDL", "LDLCALC", "TRIG", "CHOLHDL", "LDLDIRECT" in the last 72 hours. Thyroid Function Tests: No results for input(s): "TSH", "T4TOTAL", "FREET4", "T3FREE", "THYROIDAB" in the last 72  hours. Anemia Panel: No results for input(s): "VITAMINB12", "FOLATE", "FERRITIN", "TIBC", "IRON", "RETICCTPCT" in the last 72 hours. Urine analysis:    Component Value Date/Time   COLORURINE YELLOW 08/12/2021 1130   APPEARANCEUR CLEAR 08/12/2021 1130   LABSPEC 1.015 08/12/2021 1130   PHURINE 7.0 08/12/2021 1130   GLUCOSEU NEGATIVE 08/12/2021 1130   HGBUR NEGATIVE 08/12/2021 1130   HGBUR trace-intact 04/27/2008 0000   BILIRUBINUR NEGATIVE 08/12/2021 1130   BILIRUBINUR neg 10/08/2016 1202   KETONESUR NEGATIVE 08/12/2021 1130   PROTEINUR NEGATIVE 08/12/2021 1130   UROBILINOGEN 0.2 10/08/2016 1202   UROBILINOGEN negative 04/27/2008 0000   NITRITE NEGATIVE 08/12/2021 1130   LEUKOCYTESUR NEGATIVE 08/12/2021 1130    Radiological Exams on Admission: CT Head Wo Contrast  Result Date: 06/19/2022 CLINICAL DATA:  72 year old female with left side numbness including the face upon waking today. Unintentional weight loss. EXAM: CT HEAD WITHOUT CONTRAST TECHNIQUE: Contiguous axial images were obtained from the base of the skull through the vertex without intravenous contrast. RADIATION DOSE REDUCTION: This exam was performed according to the departmental dose-optimization program which includes automated exposure control, adjustment of the mA and/or kV according to patient size and/or use of iterative reconstruction technique. COMPARISON:  Brain MRI 05/29/2006. FINDINGS: Brain: Cerebral volume is within normal limits for age. No midline shift, ventriculomegaly, mass effect, evidence of mass lesion, intracranial hemorrhage or evidence of cortically based acute infarction. Supratentorial gray-white differentiation appears normal for age. Questionable asymmetric hypodensity in the right brainstem at the pons on series 2, image 8. No evidence of mass effect. But this could be artifactual. Elsewhere in the posterior fossa gray-white differentiation is within normal limits. Vascular: Calcified atherosclerosis at  the skull base. No suspicious intracranial vascular hyperdensity. Negative noncontrast cavernous sinus. Skull: No acute osseous abnormality identified. No suspicious osseous lesion. TMJ degeneration. Sinuses/Orbits: Visualized paranasal sinuses and mastoids are clear. Other: Visualized orbits and scalp soft tissues are within normal limits. Stylomastoid foramina appear unremarkable. IMPRESSION: 1. Questionable hypodensity in the right pons, could be artifact. Brain MRI (without and with contrast preferred) would best evaluate further. 2. Otherwise negative noncontrast Head CT, normal for age appearance of the brain. Electronically Signed   By: Odessa Fleming M.D.   On: 06/19/2022 10:39    EKG: Independently reviewed.  Sinus rhythm.  Assessment and Plan  Acute CVA Patient presenting with complaints of left-sided perioral numbness and and bilateral hand tingling.  Symptoms started 2 days ago and now resolved.  No focal neurodeficit on exam at this time.  MRI showing patchy acute small vessel infarct in the left pons, small acute lacunar infarct in the right thalamus, and additional recent punctate ischemia in the anterior right frontal lobe and left caudate nucleus.  Concern for embolic stroke given multiple vascular territory involvement. -Neurology consulted, appreciate recommendations -Telemetry monitoring -Echocardiogram -CTA head and neck -A1c 6.3 on 05/28/2022 -LDL 80 on lipid panel 05/28/2022, continue high intensity statin -Neurology started DAPT: Patient was given Plavix loading dose and started on Plavix 75 mg daily and aspirin 81 mg daily. -Frequent neurochecks -PT, OT, speech therapy. -N.p.o. until cleared by bedside swallow evaluation or formal speech evaluation   Borderline hyperkalemia Hold lisinopril and repeat labs.  Hypertension Blood pressure stable.  Allow permissive  hypertension at this time.  Type 2 diabetes Well-controlled per recent A1c.  CBG checks ACHS/sensitive sliding scale  insulin as needed.  Hyperlipidemia Continue Lipitor.  Hypothyroidism: Check TSH Anxiety/depression GERD Continue home medications after pharmacy med rec is done.  DVT prophylaxis: Lovenox Code Status: Full Code (discussed with the patient) Family Communication: No family available at this time. Level of care: Telemetry bed Admission status: It is my clinical opinion that referral for OBSERVATION is reasonable and necessary in this patient based on the above information provided. The aforementioned taken together are felt to place the patient at high risk for further clinical deterioration. However, it is anticipated that the patient may be medically stable for discharge from the hospital within 24 to 48 hours.   John Giovanni MD [facility]  If 7PM-7AM, please contact night-coverage [email or web address]  06/20/2022, 4:35 AM

## 2022-06-20 NOTE — Evaluation (Addendum)
Physical Therapy Evaluation Patient Details Name: Amy Mahoney MRN: [id number] DOB: 1950-08-19 Today's Date: 06/20/2022  History of Present Illness  Pt is a 72 y.o. F who presents 06/19/2022 with left sided facial numbness/tingling and left hand numbness. 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 Significant PMH: DM2, HTN, HLD, mild aortic stenosis, obesity, anxiety/depression, OSA on CPAP.  Clinical Impression  PTA, pt lives with her 39 y.o. father and is his caregiver. Pt presents with mild left lower extremity proprioceptive and balance deficits in comparison to her baseline. Pt ambulating 350 ft with no assistive device; supervision overall for safety with negotiating level surfaces, up to min guard assist with balance challenge. Pt scoring 19/24 on the DGI, indicating she may be at increased risk for falls. Would benefit from follow up OPPT for further balance training. No further acute PT needs; thank you for this consult.     Recommendations for follow up therapy are one component of a multi-disciplinary discharge planning process, led by the attending physician.  Recommendations may be updated based on patient status, additional functional criteria and insurance authorization.  Follow Up Recommendations       Assistance Recommended at Discharge PRN  Patient can return home with the following  Help with stairs or ramp for entrance    Equipment Recommendations None recommended by PT  Recommendations for Other Services       Functional Status Assessment Patient has had a recent decline in their functional status and demonstrates the ability to make significant improvements in function in a reasonable and  predictable amount of time.     Precautions / Restrictions Precautions Precautions: Fall Precaution Comments: mild fall risk Restrictions Weight Bearing Restrictions: No      Mobility  Bed Mobility               General bed mobility comments: Sitting EOB with OT upon arrival    Transfers Overall transfer level: Independent Equipment used: None                    Ambulation/Gait Ambulation/Gait assistance: Supervision, Min guard Gait Distance (Feet): 350 Feet Assistive device: None Gait Pattern/deviations: Step-through pattern, Decreased stride length, Decreased step length - left Gait velocity: decreased     General Gait Details: Mildly decreased L step length, supervision for level surfaces, min guard intermittently with balance challenge  Stairs Stairs: Yes Stairs assistance: Min guard Stair Management: No rails, One rail Right Number of Stairs: 5 General stair comments: Increased stability with rail vs without  Wheelchair Mobility    Modified Rankin (Stroke Patients Only) Modified Rankin (Stroke Patients Only) Pre-Morbid Rankin Score: No symptoms Modified Rankin: Moderately severe disability     Balance Overall balance assessment: Needs assistance Sitting-balance support: Feet supported Sitting balance-Leahy Scale: Good     Standing balance support: During functional activity, No upper extremity supported Standing balance-Leahy Scale: Good   Single Leg Stance - Right Leg: 8 Single Leg Stance - Left Leg: 2 Tandem Stance - Right Leg: 10 Tandem Stance - Left Leg: 10 Rhomberg - Eyes Opened: 10                   Pertinent Vitals/Pain Pain Assessment Pain Assessment: No/denies pain  Home Living Family/patient expects to be discharged to:: Private residence Living Arrangements: Parent Available Help at Discharge: Family;Available 24 hours/day Type of Home: Other(Comment) (townhouse) Home Access: Stairs to enter;Ramped  entrance   Entrance Stairs-Number of Steps: 1   Home Layout: One level Home Equipment: Cane - single point;Grab bars - tub/shower Additional Comments: primary caregiver for her dad (85 yo), mostly IADLs    Prior Function Prior Level of Function : Independent/Modified Independent;Driving             Mobility Comments: caregiver for 44 y.o. father, has small dog       Hand Dominance   Dominant Hand: Left    Extremity/Trunk Assessment   Upper Extremity Assessment Upper Extremity Assessment: Defer to OT evaluation    Lower Extremity Assessment Lower Extremity Assessment: RLE deficits/detail;LLE deficits/detail RLE Deficits / Details: Strength 5/5 RLE Sensation: WNL LLE Deficits / Details: Strength 5/5 LLE Sensation: WNL    Cervical / Trunk Assessment Cervical / Trunk Assessment: Normal  Communication   Communication: HOH  Cognition Arousal/Alertness: Awake/alert Behavior During Therapy: WFL for tasks assessed/performed Overall Cognitive Status: Within Functional Limits for tasks assessed                                          General Comments      Exercises     Assessment/Plan    PT Assessment Patient does not need any further PT services  PT Problem List         PT Treatment Interventions      PT Goals (Current goals can be found in the Care Plan section)  Acute Rehab PT Goals Patient Stated Goal: return to baseline PT Goal Formulation: All assessment and education complete, DC therapy    Frequency       Co-evaluation               AM-PAC PT "6 Clicks" Mobility  Outcome Measure Help needed turning from your back to your side while in a flat bed without using bedrails?: None Help needed moving from lying on your back to sitting on the side of a flat bed without using bedrails?: None Help needed moving to and from a bed to a chair (including a wheelchair)?: None Help needed standing up from a chair using your arms  (e.g., wheelchair or bedside chair)?: None Help needed to walk in hospital room?: A Little Help needed climbing 3-5 steps with a railing? : A Little 6 Click Score: 22    End of Session Equipment Utilized During Treatment: Gait belt Activity Tolerance: Patient tolerated treatment well Patient left: in bed;with call bell/phone within reach Nurse Communication: Mobility status PT Visit Diagnosis: Unsteadiness on feet (R26.81)    Time: 0981-1914 PT Time Calculation (min) (ACUTE ONLY): 17 min   Charges:   PT Evaluation $PT Eval Low Complexity: 1 Low          Lillia Pauls, PT, DPT Acute Rehabilitation Services Office [phone]   Norval Morton 06/20/2022, 11:33 AM

## 2022-06-20 NOTE — TOC Transition Note (Signed)
Transition of Care [facility]) - CM/SW Discharge Note   Patient Details  Name: Amy Mahoney MRN: [id number] Date of Birth: January 25, 1951  [facility] CM/SW Contact:  Kermit Balo, RN Phone Number: 06/20/2022, 11:55 AM   Clinical Narrative:    Patient is discharging home with outpatient therapy when medically ready.. She lives with her father. Her brother and sister in law live right behind her and can assist if needed.  No DME at home.  She manage her own medications and denies any issues.  [facility] and [facility] on [address] for quicker needed meds. Pt was driving self but her brother and sister in law can assist.  Outpatient therapies arranged through [facility]. Information on the AV[facility]. The outpatient will contact her for the first appointment. Pt has transport home when discharged.    Final next level of care: OP Rehab Barriers to Discharge: No Barriers Identified   Patient Goals and CMS Choice   Choice offered to / list presented to : Patient  Discharge Placement                         Discharge Plan and Services Additional resources added to the After Visit Summary for                                       Social Determinants of Health (SDOH) Interventions SDOH Screenings   Food Insecurity: No Food Insecurity (06/19/2022)  Housing: Low Risk  (06/19/2022)  Transportation Needs: No Transportation Needs (06/19/2022)  Utilities: Not At Risk (06/19/2022)  Alcohol Screen: Low Risk  (05/22/2022)  Depression (PHQ2-9): Low Risk  (05/22/2022)  Financial Resource Strain: Low Risk  (05/27/2022)  Physical Activity: Insufficiently Active (05/27/2022)  Social Connections: Moderately Integrated (05/27/2022)  Stress: No Stress Concern Present (05/27/2022)  Tobacco Use: Medium Risk (06/19/2022)     Readmission Risk Interventions     No data to display

## 2022-06-20 NOTE — Progress Notes (Addendum)
STROKE TEAM PROGRESS NOTE   SUBJECTIVE (INTERVAL HISTORY) No family is at the bedside.  Overall her condition is completely resolved.  Patient stated that she had right hand numbness on Monday lasted 1 day, then left hand numbness and left perioral numbness lasted 1.5 days.  Currently at baseline.  Denies any heart palpitation, racing heart or cardiac history.   OBJECTIVE Temp:  [98.1 F (36.7 C)-98.7 F (37.1 C)] 98.1 F (36.7 C) (05/01 1150) Pulse Rate:  [66-84] 70 (05/01 1150) Cardiac Rhythm: Normal sinus rhythm (05/01 0826) Resp:  [16-18] 18 (05/01 1150) BP: (119-150)/(60-100) 125/60 (05/01 1150) SpO2:  [94 %-99 %] 96 % (05/01 1150)  Recent Labs  Lab 06/19/22 1201 06/20/22 0651 06/20/22 1152  GLUCAP 81 132* 126*   Recent Labs  Lab 06/19/22 1021 06/20/22 0717  NA 138 137  K 5.0 3.6  CL 106 108  CO2 26 23  GLUCOSE 110* 142*  BUN 20 16  CREATININE 0.92 0.74  CALCIUM 9.3 9.3   Recent Labs  Lab 06/19/22 1021  AST 44*  ALT 28  ALKPHOS 49  BILITOT 0.5  PROT 7.1  ALBUMIN 3.9   Recent Labs  Lab 06/19/22 1021 06/20/22 0717  WBC 5.6 5.4  NEUTROABS 2.6  --   HGB 12.9 13.3  HCT 40.3 41.0  MCV 93.5 93.0  PLT 265 276   No results for input(s): "CKTOTAL", "CKMB", "CKMBINDEX", "TROPONINI" in the last 168 hours. No results for input(s): "LABPROT", "INR" in the last 72 hours. No results for input(s): "COLORURINE", "LABSPEC", "PHURINE", "GLUCOSEU", "HGBUR", "BILIRUBINUR", "KETONESUR", "PROTEINUR", "UROBILINOGEN", "NITRITE", "LEUKOCYTESUR" in the last 72 hours.  Invalid input(s): "APPERANCEUR"     Component Value Date/Time   CHOL 131 06/20/2022 0717   CHOL 115 04/14/2018 1019   TRIG 109 06/20/2022 0717   HDL 27 (L) 06/20/2022 0717   HDL 40 04/14/2018 1019   CHOLHDL 4.9 06/20/2022 0717   VLDL 22 06/20/2022 0717   LDLCALC 82 06/20/2022 0717   LDLCALC 60 04/14/2018 1019   Lab Results  Component Value Date   HGBA1C 6.3 05/28/2022   No results found for:  "LABOPIA", "COCAINSCRNUR", "LABBENZ", "AMPHETMU", "THCU", "LABBARB"  No results for input(s): "ETH" in the last 168 hours.  I have personally reviewed the radiological images below and agree with the radiology interpretations.  EP PPM/ICD IMPLANT  Result Date: 06/20/2022 SURGEON:  Will Jorja Loa, MD   PREPROCEDURE DIAGNOSIS:  Cryptogenic stroke   POSTPROCEDURE DIAGNOSIS: Cryptogenic stroke    PROCEDURES:  1. Implantable loop recorder implantation   INTRODUCTION:  Amy Mahoney presents with a history of cryptogenic stroke The costs of loop recorder monitoring have been discussed with the patient. Appropriate time out was performed prior to the procedure.   DESCRIPTION OF PROCEDURE:  Informed written consent was obtained.  The patient required no sedation for the procedure today.  Mapping over the patient's chest was performed to identify the area where electrograms were most prominent for ILR recording.  This area was found to be the left parasternal region over the 4th intercostal space. The patients left chest was therefore prepped and draped in the usual sterile fashion. The skin overlying the left parasternal region was infiltrated with lidocaine for local analgesia.  A 0.5-cm incision was made over the left parasternal region over the 3rd intercostal space.  A subcutaneous ILR pocket was fashioned using a combination of sharp and blunt dissection.  A [employer] Reveal LINQ (serial # [id number]) implantable loop recorder was then placed into  the pocket  R waves were very prominent and measured 0.53mV.  Steri- Strips and a sterile dressing were then applied.  There were no early apparent complications.   CONCLUSIONS:  1. Successful implantation of a implantable loop recorder for a history of cryptogenic stroke  2. No early apparent complications. Will Jorja Loa, MD 06/20/2022 5:05 PM   VAS [name] LOWER EXTREMITY VENOUS (DVT)  Result Date: 06/20/2022  Lower Venous DVT Study Patient Name:  Amy Mahoney   Date of Exam:   06/20/2022 Medical Rec #: [id number]      Accession #:    [id number] Date of Birth: [birth year]       Patient Gender: F Patient Age:   72 years Exam Location:  [facility] Procedure:      VAS [name] LOWER EXTREMITY VENOUS (DVT) Referring Phys: Scheryl Marten Mckenzey Parcell --------------------------------------------------------------------------------  Indications: Stroke.  Comparison Study: No previous study. Performing Technologist: McKayla Maag RVT, [place]  Examination Guidelines: A complete evaluation includes B-mode imaging, spectral Doppler, color Doppler, and power Doppler as needed of all accessible portions of each vessel. Bilateral testing is considered an integral part of a complete examination. Limited examinations for reoccurring indications may be performed as noted. The reflux portion of the exam is performed with the patient in reverse Trendelenburg.  +---------+---------------+---------+-----------+----------+--------------+ RIGHT    CompressibilityPhasicitySpontaneityPropertiesThrombus Aging +---------+---------------+---------+-----------+----------+--------------+ CFV      Full           Yes      Yes                                 +---------+---------------+---------+-----------+----------+--------------+ SFJ      Full                                                        +---------+---------------+---------+-----------+----------+--------------+ FV Prox  Full                                                        +---------+---------------+---------+-----------+----------+--------------+ FV Mid   Full                                                        +---------+---------------+---------+-----------+----------+--------------+ FV DistalFull                                                        +---------+---------------+---------+-----------+----------+--------------+ PFV      Full                                                         +---------+---------------+---------+-----------+----------+--------------+ POP  Full           Yes      Yes                                 +---------+---------------+---------+-----------+----------+--------------+ PTV      Full                                                        +---------+---------------+---------+-----------+----------+--------------+ PERO     Full                                                        +---------+---------------+---------+-----------+----------+--------------+   +---------+---------------+---------+-----------+----------+--------------+ LEFT     CompressibilityPhasicitySpontaneityPropertiesThrombus Aging +---------+---------------+---------+-----------+----------+--------------+ CFV      Full           Yes      Yes                                 +---------+---------------+---------+-----------+----------+--------------+ SFJ      Full                                                        +---------+---------------+---------+-----------+----------+--------------+ FV Prox  Full                                                        +---------+---------------+---------+-----------+----------+--------------+ FV Mid   Full                                                        +---------+---------------+---------+-----------+----------+--------------+ FV DistalFull                                                        +---------+---------------+---------+-----------+----------+--------------+ PFV      Full                                                        +---------+---------------+---------+-----------+----------+--------------+ POP      Full           Yes      Yes                                 +---------+---------------+---------+-----------+----------+--------------+  PTV      Full                                                         +---------+---------------+---------+-----------+----------+--------------+ PERO     Full                                                        +---------+---------------+---------+-----------+----------+--------------+     Summary: BILATERAL: - No evidence of deep vein thrombosis seen in the lower extremities, bilaterally. - No evidence of superficial venous thrombosis in the lower extremities, bilaterally. -No evidence of popliteal cyst, bilaterally.   *See table(s) above for measurements and observations.    Preliminary    ECHOCARDIOGRAM COMPLETE  Result Date: 06/20/2022    ECHOCARDIOGRAM REPORT   Patient Name:   Amy Mahoney Date of Exam: 06/20/2022 Medical Rec #:  [id number]     Height:       65.0 in Accession #:    [id number]    Weight:       235.0 lb Date of Birth:  [birth year]      BSA:          2.118 m Patient Age:    71 years      BP:           119/65 mmHg Patient Gender: F             HR:           67 bpm. Exam Location:  Inpatient Procedure: 2D Echo, Cardiac Doppler and Color Doppler Indications:    Stroke  History:        Patient has prior history of Echocardiogram examinations, most                 recent 10/04/2021. Stroke; Risk Factors:Hypertension, Diabetes                 and HLD.  Sonographer:    Lucy Antigua Referring Phys: [id number] VASUNDHRA RATHORE IMPRESSIONS  1. Left ventricular ejection fraction, by estimation, is 65 to 70%. The left ventricle has normal function. Left ventricular endocardial border not optimally defined to evaluate regional wall motion. Left ventricular diastolic function could not be evaluated.  2. Right ventricular systolic function is hyperdynamic. The right ventricular size is normal. Tricuspid regurgitation signal is inadequate for assessing PA pressure.  3. The mitral valve was not well visualized. No evidence of mitral valve regurgitation. Moderate mitral annular calcification.  4. The aortic valve was not well visualized. Aortic valve regurgitation is not  visualized. Aortic valve mean gradient measures 9.0 mmHg. Comparison(s): Prior images reviewed side by side. Similar to prior. Aortic gradients similar to prior with slight overestimation in prior study. FINDINGS  Left Ventricle: Left ventricular ejection fraction, by estimation, is 65 to 70%. The left ventricle has normal function. Left ventricular endocardial border not optimally defined to evaluate regional wall motion. The left ventricular internal cavity size was normal in size. There is no left ventricular hypertrophy. Left ventricular diastolic function could not be evaluated due to mitral annular calcification (moderate or greater). Left ventricular diastolic function could not be evaluated. Right Ventricle: The  right ventricular size is normal. No increase in right ventricular wall thickness. Right ventricular systolic function is hyperdynamic. Tricuspid regurgitation signal is inadequate for assessing PA pressure. Left Atrium: Left atrial size was normal in size. Right Atrium: Right atrial size was normal in size. Pericardium: Trivial pericardial effusion is present. Presence of epicardial fat layer. Mitral Valve: The mitral valve was not well visualized. Moderate mitral annular calcification. No evidence of mitral valve regurgitation. Tricuspid Valve: The tricuspid valve is grossly normal. Tricuspid valve regurgitation is not demonstrated. Aortic Valve: The aortic valve was not well visualized. Aortic valve regurgitation is not visualized. Aortic valve mean gradient measures 9.0 mmHg. Aortic valve peak gradient measures 16.6 mmHg. Aortic valve area, by VTI measures 1.62 cm. Pulmonic Valve: The pulmonic valve was not well visualized. Pulmonic valve regurgitation is not visualized. No evidence of pulmonic stenosis. Aorta: The aortic root and ascending aorta are structurally normal, with no evidence of dilitation. IAS/Shunts: The interatrial septum was not well visualized.  LEFT VENTRICLE PLAX 2D LVIDd:          3.70 cm   Diastology LVIDs:         2.60 cm   LV e' medial:    5.22 cm/s LV PW:         1.00 cm   LV E/e' medial:  22.6 LV IVS:        1.00 cm   LV e' lateral:   5.55 cm/s LVOT diam:     1.90 cm   LV E/e' lateral: 21.3 LV SV:         74 LV SV Index:   35 LVOT Area:     2.84 cm  LEFT ATRIUM             Index LA Vol (A2C):   60.9 ml 28.75 ml/m LA Vol (A4C):   43.5 ml 20.53 ml/m LA Biplane Vol: 52.3 ml 24.69 ml/m  AORTIC VALVE AV Area (Vmax):    1.61 cm AV Area (Vmean):   1.46 cm AV Area (VTI):     1.62 cm AV Vmax:           204.00 cm/s AV Vmean:          143.000 cm/s AV VTI:            0.459 m AV Peak Grad:      16.6 mmHg AV Mean Grad:      9.0 mmHg LVOT Vmax:         116.00 cm/s LVOT Vmean:        73.600 cm/s LVOT VTI:          0.262 m LVOT/AV VTI ratio: 0.57  AORTA Ao Root diam: 3.10 cm Ao Asc diam:  3.00 cm MITRAL VALVE MV Area (PHT): 2.27 cm     SHUNTS MV E velocity: 118.00 cm/s  Systemic VTI:  0.26 m MV A velocity: 152.00 cm/s  Systemic Diam: 1.90 cm MV E/A ratio:  0.78 Riley Lam MD Electronically signed by Riley Lam MD Signature Date/Time: 06/20/2022/11:13:17 AM    Final    CT ANGIO HEAD NECK W WO CM  Result Date: 06/20/2022 CLINICAL DATA:  Stroke/TIA, determine embolic source EXAM: CT ANGIOGRAPHY HEAD AND NECK WITH AND WITHOUT CONTRAST TECHNIQUE: Multidetector CT imaging of the head and neck was performed using the standard protocol during bolus administration of intravenous contrast. Multiplanar CT image reconstructions and MIPs were obtained to evaluate the vascular anatomy. Carotid stenosis measurements (when applicable) are obtained utilizing NASCET  criteria, using the distal internal carotid diameter as the denominator. RADIATION DOSE REDUCTION: This exam was performed according to the departmental dose-optimization program which includes automated exposure control, adjustment of the mA and/or kV according to patient size and/or use of iterative reconstruction technique.  CONTRAST:  75mL OMNIPAQUE IOHEXOL 350 MG/ML SOLN COMPARISON:  None Available. FINDINGS: CT HEAD FINDINGS Brain: Known acute infarct better characterized on same day MRI. No evidence of acute hemorrhage, hydrocephalus, extra-axial collection or mass lesion/mass effect. Vascular: See below. Skull: No acute fracture. Sinuses/Orbits: Clear sinuses.  No acute orbital findings. Other: No mastoid effusions. Review of the MIP images confirms the above findings CTA NECK FINDINGS Aortic arch: Great vessel origins are patent without significant stenosis. Right carotid system: No evidence of dissection, stenosis (50% or greater), or occlusion. Left carotid system: No evidence of dissection, stenosis (50% or greater), or occlusion. Vertebral arteries: Codominant. No evidence of dissection, stenosis (50% or greater), or occlusion. Skeleton: No acute abnormality on limited assessment. Other neck: No acute abnormality on limited assessment. Upper chest: Visualized lung apices are clear. Review of the MIP images confirms the above findings CTA HEAD FINDINGS Anterior circulation: Bilateral intracranial ICAs, MCAs, and ACAs are patent without proximal hemodynamically significant stenosis. Posterior circulation: Bilateral intradural vertebral arteries, basilar artery and bilateral posterior cerebral arteries are patent without proximal hemodynamically significant stenosis. Venous sinuses: As permitted by contrast timing, patent. Review of the MIP images confirms the above findings IMPRESSION: No large vessel occlusion or proximal hemodynamically significant stenosis. Electronically Signed   By: Feliberto Harts M.D.   On: 06/20/2022 09:57   MR BRAIN W WO CONTRAST  Result Date: 06/20/2022 CLINICAL DATA:  72 year old female with left side neurologic deficits. Questionable right pontine abnormality on noncontrast head CT yesterday. EXAM: MRI HEAD WITHOUT AND WITH CONTRAST TECHNIQUE: Multiplanar, multiecho pulse sequences of the brain  and surrounding structures were obtained without and with intravenous contrast. CONTRAST:  10mL GADAVIST GADOBUTROL 1 MMOL/ML IV SOLN COMPARISON:  Head CT yesterday.  Previous brain MRI 05/29/2006. FINDINGS: Brain: Linear and nodular diffusion restriction is confirmed in the pons, although to the left of midline (series 2, images 16 and 17). Faint associated T2 and FLAIR hyperintensity there in the left pons. Right pons is within normal limits. However, there is superimposed additional round 3-4 mm area of restricted diffusion in the central right thalamus (series 2, image 26) also with faint T2 and FLAIR hyperintensity. And furthermore there is punctate anterior right frontal lobe white matter area of abnormal diffusion on series 2, image 30, which appears restricted on axial ADC. And also more subtle abnormal diffusion in the left caudate nucleus series 2, image 27, which is not obviously restricted on ADC, minimal T2 and FLAIR hyperintensity there. Underlying cerebral volume remains normal for age. No cortical encephalomalacia, acute or chronic cerebral blood products identified. None of the abnormal diffusion foci are enhancing. And no abnormal enhancement identified. No dural thickening. No midline shift, mass effect, evidence of mass lesion, ventriculomegaly, extra-axial collection. Cervicomedullary junction and pituitary are within normal limits. Background gray and white matter signal remains normal for age. Vascular: Major intracranial vascular flow voids are stable since 2008, preserved. Skull and upper cervical spine: Normal for age visible cervical spine. Visible bone marrow signal is stable and within normal limits. Sinuses/Orbits: Negative. Other: Visible internal auditory structures appear normal. Mastoids are clear. Negative visible scalp and face. IMPRESSION: 1. Patchy acute small vessel infarct in the LEFT pons, but also small acute lacunar infarct  of the Right Thalamus, and additional recent  punctate ischemia also suspected in the anterior right frontal lobe and left caudate nucleus. This multiple vascular territory involvement raises suspicion of recent embolic event. Synchronous small vessel disease might also have this appearance - but little to no evidence of chronic small vessel ischemia (see #3). 2. No associated hemorrhage or intracranial mass effect. 3. Elsewhere normal for age MRI appearance of the brain. Electronically Signed   By: Odessa Fleming M.D.   On: 06/20/2022 06:49   CT Head Wo Contrast  Result Date: 06/19/2022 CLINICAL DATA:  72 year old female with left side numbness including the face upon waking today. Unintentional weight loss. EXAM: CT HEAD WITHOUT CONTRAST TECHNIQUE: Contiguous axial images were obtained from the base of the skull through the vertex without intravenous contrast. RADIATION DOSE REDUCTION: This exam was performed according to the departmental dose-optimization program which includes automated exposure control, adjustment of the mA and/or kV according to patient size and/or use of iterative reconstruction technique. COMPARISON:  Brain MRI 05/29/2006. FINDINGS: Brain: Cerebral volume is within normal limits for age. No midline shift, ventriculomegaly, mass effect, evidence of mass lesion, intracranial hemorrhage or evidence of cortically based acute infarction. Supratentorial gray-white differentiation appears normal for age. Questionable asymmetric hypodensity in the right brainstem at the pons on series 2, image 8. No evidence of mass effect. But this could be artifactual. Elsewhere in the posterior fossa gray-white differentiation is within normal limits. Vascular: Calcified atherosclerosis at the skull base. No suspicious intracranial vascular hyperdensity. Negative noncontrast cavernous sinus. Skull: No acute osseous abnormality identified. No suspicious osseous lesion. TMJ degeneration. Sinuses/Orbits: Visualized paranasal sinuses and mastoids are clear. Other:  Visualized orbits and scalp soft tissues are within normal limits. Stylomastoid foramina appear unremarkable. IMPRESSION: 1. Questionable hypodensity in the right pons, could be artifact. Brain MRI (without and with contrast preferred) would best evaluate further. 2. Otherwise negative noncontrast Head CT, normal for age appearance of the brain. Electronically Signed   By: Odessa Fleming M.D.   On: 06/19/2022 10:39   MM 3D SCREENING MAMMOGRAM BILATERAL BREAST  Result Date: 05/30/2022 CLINICAL DATA:  Screening. EXAM: DIGITAL SCREENING BILATERAL MAMMOGRAM WITH TOMOSYNTHESIS AND CAD TECHNIQUE: Bilateral screening digital craniocaudal and mediolateral oblique mammograms were obtained. Bilateral screening digital breast tomosynthesis was performed. The images were evaluated with computer-aided detection. COMPARISON:  Previous exam(s). ACR Breast Density Category a: The breasts are almost entirely fatty. FINDINGS: There are no findings suspicious for malignancy. IMPRESSION: No mammographic evidence of malignancy. A result letter of this screening mammogram will be mailed directly to the patient. RECOMMENDATION: Screening mammogram in one year. (Code:[id number]-B-01Y) BI-RADS CATEGORY  1: Negative. Electronically Signed   By: Meda Klinefelter M.D.   On: 05/30/2022 07:59     PHYSICAL EXAM  Temp:  [98.1 F (36.7 C)-98.7 F (37.1 C)] 98.1 F (36.7 C) (05/01 1150) Pulse Rate:  [66-84] 70 (05/01 1150) Resp:  [16-18] 18 (05/01 1150) BP: (119-150)/(60-100) 125/60 (05/01 1150) SpO2:  [94 %-99 %] 96 % (05/01 1150)  General - Well nourished, well developed, in no apparent distress.  Ophthalmologic - fundi not visualized due to noncooperation.  Cardiovascular - Regular rhythm and rate.  Mental Status -  Level of arousal and orientation to time, place, and person were intact. Language including expression, naming, repetition, comprehension was assessed and found intact. Attention span and concentration were  normal. Recent and remote memory were intact. Fund of Knowledge was assessed and was intact.  Cranial Nerves II - XII -  II - Visual field intact OU. III, IV, VI - Extraocular movements intact. V - Facial sensation intact bilaterally. VII - Facial movement intact bilaterally. VIII - Hearing & vestibular intact bilaterally. X - Palate elevates symmetrically. XI - Chin turning & shoulder shrug intact bilaterally. XII - Tongue protrusion intact.  Motor Strength - The patient's strength was normal in all extremities and pronator drift was absent.  Bulk was normal and fasciculations were absent.   Motor Tone - Muscle tone was assessed at the neck and appendages and was normal.  Reflexes - The patient's reflexes were symmetrical in all extremities and she had no pathological reflexes.  Sensory - Light touch, temperature/pinprick were assessed and were symmetrical.    Coordination - The patient had normal movements in the hands and feet with no ataxia or dysmetria.  Tremor was absent.  Gait and Station - deferred.   ASSESSMENT/PLAN Amy Mahoney is a 72 y.o. female with history of hypertension, hyperlipidemia, diabetes, OSA on CPAP, obesity admitted for transient right hand numbness followed by left hand numbness and left perioral numbness. No tPA given due to symptom resolved.    Stroke: Multifocal infarcts, embolic pattern concerning for cardioembolic source CT questionable right pontine infarct CTA head and neck unremarkable MRI left dorsal pontine infarct, right thalamic, left caudate head and right frontal white matter infarcts. 2D Echo EF 65 to 70% LE venous Doppler no DVT Loop recorder placed LDL 82 HgbA1c 6.3 Lovenox for VTE prophylaxis No antithrombotic prior to admission, now on aspirin 81 mg daily and clopidogrel 75 mg daily DAPT for 3 weeks and then aspirin alone Patient counseled to be compliant with her antithrombotic medications Ongoing aggressive stroke risk  factor management Therapy recommendations: None Disposition: Home today  Diabetes HgbA1c 6.3 goal < 7.0 Controlled CBG monitoring SSI DM education and close PCP follow up  Hypertension Stable Long term BP goal normotensive  Hyperlipidemia Home meds: Fenofibrate 160, Lipitor 40 LDL 82, goal < 70 Now on fenofibrate 160 and Lipitor 80 Continue statin at discharge  Other Stroke Risk Factors Advanced age Obesity, Body mass index is 39.11 kg/m.  Obstructive sleep apnea, on CPAP at home  Other Active Problems   Hospital day # 0  Neurology will sign off. Please call with questions. Pt will follow up with stroke clinic [facility] at [facility] in about 4 weeks. Thanks for the consult.   Marvel Plan, MD PhD Stroke Neurology 06/20/2022 7:44 PM  I discussed with Dr. Blake Divine. I spent additional 30 minutes inpatient face-to-face time with the patient, more than 50% of which was spent in counseling and coordination of care, reviewing test results, images and medication, and discussing the diagnosis, treatment plan and potential prognosis. This patient's care requiresreview of multiple databases, neurological assessment, discussion with family, other specialists and medical decision making of high [email or web address]      To contact Stroke Continuity provider, please refer to [email or web address]. After hours, contact General Neurology

## 2022-06-21 ENCOUNTER — Encounter (HOSPITAL_COMMUNITY): Payer: Self-pay | Admitting: Cardiology

## 2022-06-21 NOTE — Discharge Summary (Signed)
Physician Discharge Summary   Patient: Amy Mahoney MRN: [id number] DOB: July 31, 1950  Admit date:     06/19/2022  Discharge date: 06/20/2022  Discharge Physician: Kathlen Mody   PCP: Donato Schultz, DO   Recommendations at discharge:  Pleas follow up with PCP in one week.  Please follow up with neurology as recommended.   Discharge Diagnoses: Principal Problem:   Stroke (cerebrum) (HCC) Active Problems:   Hypothyroidism   Depression with anxiety   Primary hypertension   Type 2 diabetes mellitus (HCC)   Hyperlipidemia    Hospital Course: Amy Mahoney is a 72 y.o. female with medical history significant of type 2 diabetes, hypertension, hyperlipidemia, hypothyroidism, mild aortic stenosis obesity (BMI 39.11), anxiety/depression, GERD, OSA on CPAP presented to ED with left-sided facial numbness/tingling and left hand numbness.   Assessment and Plan:   Acute CVA  MRI showing patchy acute small vessel infarct in the left pons, small acute lacunar infarct in the right thalamus, and additional recent punctate ischemia in the anterior right frontal lobe and left caudate nucleus. Concern for embolic stroke given multiple vascular territory involvement  Echocardiogram reviewed.  CT questionable right pontine infarct CTA head and neck unremarkable LDL  80, resume high intensity statin.  Continue with aspirin and plavix,for 3 weeks, then aspirin alone.  Pt underwent loop recorder placement for evaluation of arrhythmias.   A1c is 6.3 LE duplex did not show DVT.    Hypertension:  Well controlled.    TYPE 2 DM A1c is 6.3% Resume home meds.    Hyperlipidemia:  Resume statin at discharge.    Hypothyroidism:     Consultants: neurology.  Procedures performed: MRI brain.   Disposition: Home Diet recommendation:  Discharge Diet Orders (From admission, onward)     Start     Ordered   06/20/22 0000  Diet - low sodium heart healthy        06/20/22 1654            Cardiac diet DISCHARGE MEDICATION: Allergies as of 06/20/2022   No Known Allergies      Medication List     STOP taking these medications    moxifloxacin 0.5 % ophthalmic solution Commonly known as: Vigamox       TAKE these medications    aspirin EC 81 MG tablet Take 1 tablet (81 mg total) by mouth daily. Swallow whole.   atorvastatin 80 MG tablet Commonly known as: Lipitor Take 1 tablet (80 mg total) by mouth daily. What changed:  medication strength how much to take   B-D UF III MINI PEN NEEDLES 31G X 5 MM Misc Generic drug: Insulin Pen Needle USE AS DIRECTED   CALCIUM 500 + D PO Take 1 tablet by mouth 2 (two) times daily.   clonazePAM 0.5 MG tablet Commonly known as: KLONOPIN TAKE 1 TABLET BY MOUTH 2 TIMES DAILY AS NEEDED FOR ANXIETY.   clopidogrel 75 MG tablet Commonly known as: PLAVIX Take 1 tablet (75 mg total) by mouth daily for 21 days.   fenofibrate 160 MG tablet TAKE 1 TABLET BY MOUTH DAILY   hydrocortisone cream 1 % Apply to affected area 2 times daily   levothyroxine 112 MCG tablet Commonly known as: SYNTHROID TAKE 1 TABLET BY MOUTH DAILY  BEFORE BREAKFAST   lisinopril 5 MG tablet Commonly known as: ZESTRIL TAKE 1 TABLET BY MOUTH DAILY   multivitamin tablet Take 1 tablet by mouth daily.   omeprazole 40 MG capsule Commonly known as:  PRILOSEC Take 40 mg by mouth daily.   OneTouch Ultra test strip Generic drug: glucose blood USE AS DIRECTED UP TO 4 TIMES DAILY   tirzepatide 7.5 MG/0.5ML Pen Commonly known as: MOUNJARO Inject 7.5 mg into the skin once a week.   Evaristo Bury FlexTouch 100 UNIT/ML FlexTouch Pen Generic drug: insulin degludec INJECT SUBCUTANEOUSLY 10 UNITS  DAILY   venlafaxine XR 150 MG 24 hr capsule Commonly known as: EFFEXOR-XR Take 1 capsule (150 mg total) by mouth daily.   venlafaxine 37.5 MG tablet Commonly known as: EFFEXOR Take 1 tablet (37.5 mg total) by mouth daily.   Vitamin D (Ergocalciferol) 1.25 MG  (50000 UNIT) Caps capsule Commonly known as: DRISDOL TAKE 1 CAPSULE BY MOUTH EVERY 7  DAYS        Follow-up Information     [facility] Follow up.   Why: The outpatient therapy will contact you for the first appointment Contact information: [address] [place] [address] [phone]         [facility]. Schedule an appointment as soon as possible for a visit in 1 month(s).   Specialty: Neurology Why: stroke clinic Contact information: [address] [place] [address] [phone]               Discharge Exam: [name]   06/19/22 0956  Weight: 106.6 kg   General exam: Appears calm and comfortable  Respiratory system: Clear to auscultation. Respiratory effort normal. Cardiovascular system: S1 & S2 heard, RRR. No JVD,  Gastrointestinal system: Abdomen is nondistended, soft and nontender.  Central nervous system: Alert and oriented. No focal neurological deficits. Extremities: Symmetric 5 x 5 power. Skin: No rashes,  Psychiatry: Mood & affect appropriate.    Condition at discharge: fair  The results of significant diagnostics from this hospitalization (including imaging, microbiology, ancillary and laboratory) are listed below for reference.   Imaging Studies: VAS [name] LOWER EXTREMITY VENOUS (DVT)  Result Date: 06/20/2022  Lower Venous DVT Study Patient Name:  Amy Mahoney  Date of Exam:   06/20/2022 Medical Rec #: [id number]      Accession #:    [id number] Date of Birth: 1950-03-29       Patient Gender: F Patient Age:   56 years Exam Location:  [facility] Procedure:      VAS [name] LOWER EXTREMITY VENOUS (DVT) Referring Phys: Scheryl Marten XU --------------------------------------------------------------------------------  Indications: Stroke.  Comparison Study: No previous study. Performing Technologist: McKayla Maag RVT, VT  Examination Guidelines: A  complete evaluation includes B-mode imaging, spectral Doppler, color Doppler, and power Doppler as needed of all accessible portions of each vessel. Bilateral testing is considered an integral part of a complete examination. Limited examinations for reoccurring indications may be performed as noted. The reflux portion of the exam is performed with the patient in reverse Trendelenburg.  +---------+---------------+---------+-----------+----------+--------------+ RIGHT    CompressibilityPhasicitySpontaneityPropertiesThrombus Aging +---------+---------------+---------+-----------+----------+--------------+ CFV      Full           Yes      Yes                                 +---------+---------------+---------+-----------+----------+--------------+ SFJ      Full                                                        +---------+---------------+---------+-----------+----------+--------------+  FV Prox  Full                                                        +---------+---------------+---------+-----------+----------+--------------+ FV Mid   Full                                                        +---------+---------------+---------+-----------+----------+--------------+ FV DistalFull                                                        +---------+---------------+---------+-----------+----------+--------------+ PFV      Full                                                        +---------+---------------+---------+-----------+----------+--------------+ POP      Full           Yes      Yes                                 +---------+---------------+---------+-----------+----------+--------------+ PTV      Full                                                        +---------+---------------+---------+-----------+----------+--------------+ PERO     Full                                                         +---------+---------------+---------+-----------+----------+--------------+   +---------+---------------+---------+-----------+----------+--------------+ LEFT     CompressibilityPhasicitySpontaneityPropertiesThrombus Aging +---------+---------------+---------+-----------+----------+--------------+ CFV      Full           Yes      Yes                                 +---------+---------------+---------+-----------+----------+--------------+ SFJ      Full                                                        +---------+---------------+---------+-----------+----------+--------------+ FV Prox  Full                                                        +---------+---------------+---------+-----------+----------+--------------+  FV Mid   Full                                                        +---------+---------------+---------+-----------+----------+--------------+ FV DistalFull                                                        +---------+---------------+---------+-----------+----------+--------------+ PFV      Full                                                        +---------+---------------+---------+-----------+----------+--------------+ POP      Full           Yes      Yes                                 +---------+---------------+---------+-----------+----------+--------------+ PTV      Full                                                        +---------+---------------+---------+-----------+----------+--------------+ PERO     Full                                                        +---------+---------------+---------+-----------+----------+--------------+     Summary: BILATERAL: - No evidence of deep vein thrombosis seen in the lower extremities, bilaterally. - No evidence of superficial venous thrombosis in the lower extremities, bilaterally. -No evidence of popliteal cyst, bilaterally.   *See table(s) above for measurements  and observations. Electronically signed by Coral Else MD on 06/20/2022 at 10:13:29 PM.    Final    EP PPM/ICD IMPLANT  Result Date: 06/20/2022 SURGEON:  Will Jorja Loa, MD   PREPROCEDURE DIAGNOSIS:  Cryptogenic stroke   POSTPROCEDURE DIAGNOSIS: Cryptogenic stroke    PROCEDURES:  1. Implantable loop recorder implantation   INTRODUCTION:  KANOELANI DOBIES presents with a history of cryptogenic stroke The costs of loop recorder monitoring have been discussed with the patient. Appropriate time out was performed prior to the procedure.   DESCRIPTION OF PROCEDURE:  Informed written consent was obtained.  The patient required no sedation for the procedure today.  Mapping over the patient's chest was performed to identify the area where electrograms were most prominent for ILR recording.  This area was found to be the left parasternal region over the 4th intercostal space. The patients left chest was therefore prepped and draped in the usual sterile fashion. The skin overlying the left parasternal region was infiltrated with lidocaine for local analgesia.  A 0.5-cm incision was made over the left parasternal region over the 3rd intercostal  space.  A subcutaneous ILR pocket was fashioned using a combination of sharp and blunt dissection.  A Med[employer]onic Reveal LINQ (serial # [id number]) implantable loop recorder was then placed into the pocket  R waves were very prominent and measured 0.27mV.  Steri- Strips and a sterile dressing were then applied.  There were no early apparent complications.   CONCLUSIONS:  1. Successful implantation of a implantable loop recorder for a history of cryptogenic stroke  2. No early apparent complications. Will Jorja Loa, MD 06/20/2022 5:05 PM   ECHOCARDIOGRAM COMPLETE  Result Date: 06/20/2022    ECHOCARDIOGRAM REPORT   Patient Name:   CHYENNE SOBCZAK Date of Exam: 06/20/2022 Medical Rec #:  [id number]     Height:       65.0 in Accession #:    [id number]    Weight:       235.0 lb Date of  Birth:  Nov 19, 1950      BSA:          2.118 m Patient Age:    71 years      BP:           119/65 mmHg Patient Gender: F             HR:           67 bpm. Exam Location:  Inpatient Procedure: 2D Echo, Cardiac Doppler and Color Doppler Indications:    Stroke  History:        Patient has prior history of Echocardiogram examinations, most                 recent 10/04/2021. Stroke; Risk Factors:Hypertension, Diabetes                 and HLD.  Sonographer:    Lucy Antigua Referring Phys: [id number] VASUNDHRA RATHORE IMPRESSIONS  1. Left ventricular ejection fraction, by estimation, is 65 to 70%. The left ventricle has normal function. Left ventricular endocardial border not optimally defined to evaluate regional wall motion. Left ventricular diastolic function could not be evaluated.  2. Right ventricular systolic function is hyperdynamic. The right ventricular size is normal. Tricuspid regurgitation signal is inadequate for assessing PA pressure.  3. The mitral valve was not well visualized. No evidence of mitral valve regurgitation. Moderate mitral annular calcification.  4. The aortic valve was not well visualized. Aortic valve regurgitation is not visualized. Aortic valve mean gradient measures 9.0 mmHg. Comparison(s): Prior images reviewed side by side. Similar to prior. Aortic gradients similar to prior with slight overestimation in prior study. FINDINGS  Left Ventricle: Left ventricular ejection fraction, by estimation, is 65 to 70%. The left ventricle has normal function. Left ventricular endocardial border not optimally defined to evaluate regional wall motion. The left ventricular internal cavity size was normal in size. There is no left ventricular hypertrophy. Left ventricular diastolic function could not be evaluated due to mitral annular calcification (moderate or greater). Left ventricular diastolic function could not be evaluated. Right Ventricle: The right ventricular size is normal. No increase in right  ventricular wall thickness. Right ventricular systolic function is hyperdynamic. Tricuspid regurgitation signal is inadequate for assessing PA pressure. Left Atrium: Left atrial size was normal in size. Right Atrium: Right atrial size was normal in size. Pericardium: Trivial pericardial effusion is present. Presence of epicardial fat layer. Mitral Valve: The mitral valve was not well visualized. Moderate mitral annular calcification. No evidence of mitral valve regurgitation. Tricuspid Valve: The tricuspid valve is grossly normal. Tricuspid valve regurgitation  is not demonstrated. Aortic Valve: The aortic valve was not well visualized. Aortic valve regurgitation is not visualized. Aortic valve mean gradient measures 9.0 mmHg. Aortic valve peak gradient measures 16.6 mmHg. Aortic valve area, by VTI measures 1.62 cm. Pulmonic Valve: The pulmonic valve was not well visualized. Pulmonic valve regurgitation is not visualized. No evidence of pulmonic stenosis. Aorta: The aortic root and ascending aorta are structurally normal, with no evidence of dilitation. IAS/Shunts: The interatrial septum was not well visualized.  LEFT VENTRICLE PLAX 2D LVIDd:         3.70 cm   Diastology LVIDs:         2.60 cm   LV e' medial:    5.22 cm/s LV PW:         1.00 cm   LV E/e' medial:  22.6 LV IVS:        1.00 cm   LV e' lateral:   5.55 cm/s LVOT diam:     1.90 cm   LV E/e' lateral: 21.3 LV SV:         74 LV SV Index:   35 LVOT Area:     2.84 cm  LEFT ATRIUM             Index LA Vol (A2C):   60.9 ml 28.75 ml/m LA Vol (A4C):   43.5 ml 20.53 ml/m LA Biplane Vol: 52.3 ml 24.69 ml/m  AORTIC VALVE AV Area (Vmax):    1.61 cm AV Area (Vmean):   1.46 cm AV Area (VTI):     1.62 cm AV Vmax:           204.00 cm/s AV Vmean:          143.000 cm/s AV VTI:            0.459 m AV Peak Grad:      16.6 mmHg AV Mean Grad:      9.0 mmHg LVOT Vmax:         116.00 cm/s LVOT Vmean:        73.600 cm/s LVOT VTI:          0.262 m LVOT/AV VTI ratio: 0.57   AORTA Ao Root diam: 3.10 cm Ao Asc diam:  3.00 cm MITRAL VALVE MV Area (PHT): 2.27 cm     SHUNTS MV E velocity: 118.00 cm/s  Systemic VTI:  0.26 m MV A velocity: 152.00 cm/s  Systemic Diam: 1.90 cm MV E/A ratio:  0.78 Riley Lam MD Electronically signed by Riley Lam MD Signature Date/Time: 06/20/2022/11:13:17 AM    Final    CT ANGIO HEAD NECK W WO CM  Result Date: 06/20/2022 CLINICAL DATA:  Stroke/TIA, determine embolic source EXAM: CT ANGIOGRAPHY HEAD AND NECK WITH AND WITHOUT CONTRAST TECHNIQUE: Multidetector CT imaging of the head and neck was performed using the standard protocol during bolus administration of intravenous contrast. Multiplanar CT image reconstructions and MIPs were obtained to evaluate the vascular anatomy. Carotid stenosis measurements (when applicable) are obtained utilizing NASCET criteria, using the distal internal carotid diameter as the denominator. RADIATION DOSE REDUCTION: This exam was performed according to the departmental dose-optimization program which includes automated exposure control, adjustment of the mA and/or kV according to patient size and/or use of iterative reconstruction technique. CONTRAST:  75mL OMNIPAQUE IOHEXOL 350 MG/ML SOLN COMPARISON:  None Available. FINDINGS: CT HEAD FINDINGS Brain: Known acute infarct better characterized on same day MRI. No evidence of acute hemorrhage, hydrocephalus, extra-axial collection or mass lesion/mass effect. Vascular: See below. Skull:  No acute fracture. Sinuses/Orbits: Clear sinuses.  No acute orbital findings. Other: No mastoid effusions. Review of the MIP images confirms the above findings CTA NECK FINDINGS Aortic arch: Great vessel origins are patent without significant stenosis. Right carotid system: No evidence of dissection, stenosis (50% or greater), or occlusion. Left carotid system: No evidence of dissection, stenosis (50% or greater), or occlusion. Vertebral arteries: Codominant. No evidence of  dissection, stenosis (50% or greater), or occlusion. Skeleton: No acute abnormality on limited assessment. Other neck: No acute abnormality on limited assessment. Upper chest: Visualized lung apices are clear. Review of the MIP images confirms the above findings CTA HEAD FINDINGS Anterior circulation: Bilateral intracranial ICAs, MCAs, and ACAs are patent without proximal hemodynamically significant stenosis. Posterior circulation: Bilateral intradural vertebral arteries, basilar artery and bilateral posterior cerebral arteries are patent without proximal hemodynamically significant stenosis. Venous sinuses: As permitted by contrast timing, patent. Review of the MIP images confirms the above findings IMPRESSION: No large vessel occlusion or proximal hemodynamically significant stenosis. Electronically Signed   By: Feliberto Harts M.D.   On: 06/20/2022 09:57   MR BRAIN W WO CONTRAST  Result Date: 06/20/2022 CLINICAL DATA:  72 year old female with left side neurologic deficits. Questionable right pontine abnormality on noncontrast head CT yesterday. EXAM: MRI HEAD WITHOUT AND WITH CONTRAST TECHNIQUE: Multiplanar, multiecho pulse sequences of the brain and surrounding structures were obtained without and with intravenous contrast. CONTRAST:  10mL GADAVIST GADOBUTROL 1 MMOL/ML IV SOLN COMPARISON:  Head CT yesterday.  Previous brain MRI 05/29/2006. FINDINGS: Brain: Linear and nodular diffusion restriction is confirmed in the pons, although to the left of midline (series 2, images 16 and 17). Faint associated T2 and FLAIR hyperintensity there in the left pons. Right pons is within normal limits. However, there is superimposed additional round 3-4 mm area of restricted diffusion in the central right thalamus (series 2, image 26) also with faint T2 and FLAIR hyperintensity. And furthermore there is punctate anterior right frontal lobe white matter area of abnormal diffusion on series 2, image 30, which appears  restricted on axial ADC. And also more subtle abnormal diffusion in the left caudate nucleus series 2, image 27, which is not obviously restricted on ADC, minimal T2 and FLAIR hyperintensity there. Underlying cerebral volume remains normal for age. No cortical encephalomalacia, acute or chronic cerebral blood products identified. None of the abnormal diffusion foci are enhancing. And no abnormal enhancement identified. No dural thickening. No midline shift, mass effect, evidence of mass lesion, ventriculomegaly, extra-axial collection. Cervicomedullary junction and pituitary are within normal limits. Background gray and white matter signal remains normal for age. Vascular: Major intracranial vascular flow voids are stable since 2008, preserved. Skull and upper cervical spine: Normal for age visible cervical spine. Visible bone marrow signal is stable and within normal limits. Sinuses/Orbits: Negative. Other: Visible internal auditory structures appear normal. Mastoids are clear. Negative visible scalp and face. IMPRESSION: 1. Patchy acute small vessel infarct in the LEFT pons, but also small acute lacunar infarct of the Right Thalamus, and additional recent punctate ischemia also suspected in the anterior right frontal lobe and left caudate nucleus. This multiple vascular territory involvement raises suspicion of recent embolic event. Synchronous small vessel disease might also have this appearance - but little to no evidence of chronic small vessel ischemia (see #3). 2. No associated hemorrhage or intracranial mass effect. 3. Elsewhere normal for age MRI appearance of the brain. Electronically Signed   By: Odessa Fleming M.D.   On: 06/20/2022 06:49  CT Head Wo Contrast  Result Date: 06/19/2022 CLINICAL DATA:  72 year old female with left side numbness including the face upon waking today. Unintentional weight loss. EXAM: CT HEAD WITHOUT CONTRAST TECHNIQUE: Contiguous axial images were obtained from the base of the  skull through the vertex without intravenous contrast. RADIATION DOSE REDUCTION: This exam was performed according to the departmental dose-optimization program which includes automated exposure control, adjustment of the mA and/or kV according to patient size and/or use of iterative reconstruction technique. COMPARISON:  Brain MRI 05/29/2006. FINDINGS: Brain: Cerebral volume is within normal limits for age. No midline shift, ventriculomegaly, mass effect, evidence of mass lesion, intracranial hemorrhage or evidence of cortically based acute infarction. Supratentorial gray-white differentiation appears normal for age. Questionable asymmetric hypodensity in the right brainstem at the pons on series 2, image 8. No evidence of mass effect. But this could be artifactual. Elsewhere in the posterior fossa gray-white differentiation is within normal limits. Vascular: Calcified atherosclerosis at the skull base. No suspicious intracranial vascular hyperdensity. Negative noncontrast cavernous sinus. Skull: No acute osseous abnormality identified. No suspicious osseous lesion. TMJ degeneration. Sinuses/Orbits: Visualized paranasal sinuses and mastoids are clear. Other: Visualized orbits and scalp soft tissues are within normal limits. Stylomastoid foramina appear unremarkable. IMPRESSION: 1. Questionable hypodensity in the right pons, could be artifact. Brain MRI (without and with contrast preferred) would best evaluate further. 2. Otherwise negative noncontrast Head CT, normal for age appearance of the brain. Electronically Signed   By: Odessa Fleming M.D.   On: 06/19/2022 10:39   MM 3D SCREENING MAMMOGRAM BILATERAL BREAST  Result Date: 05/30/2022 CLINICAL DATA:  Screening. EXAM: DIGITAL SCREENING BILATERAL MAMMOGRAM WITH TOMOSYNTHESIS AND CAD TECHNIQUE: Bilateral screening digital craniocaudal and mediolateral oblique mammograms were obtained. Bilateral screening digital breast tomosynthesis was performed. The images were  evaluated with computer-aided detection. COMPARISON:  Previous exam(s). ACR Breast Density Category a: The breasts are almost entirely fatty. FINDINGS: There are no findings suspicious for malignancy. IMPRESSION: No mammographic evidence of malignancy. A result letter of this screening mammogram will be mailed directly to the patient. RECOMMENDATION: Screening mammogram in one year. (Code:SM-B-01Y) BI-RADS CATEGORY  1: Negative. Electronically Signed   By: Meda Klinefelter M.D.   On: 05/30/2022 07:59    Microbiology: Results for orders placed or performed in visit on 01/02/22  WOUND CULTURE     Status: Abnormal   Collection Time: 01/03/22  7:04 AM   Specimen: Wound  Result Value Ref Range Status   MICRO NUMBER: [id number]  Final   SPECIMEN QUALITY: Adequate  Final   SOURCE: WOUND (SITE NOT SPECIFIED)  Final   STATUS: FINAL  Final   GRAM STAIN:   Final    No white blood cells seen No epithelial cells seen Many Gram positive cocci in clusters   ISOLATE 1: Staphylococcus aureus (A)  Final    Comment: Heavy growth of Staphylococcus aureus Negative for inducible clindamycin resistance.      Susceptibility   Staphylococcus aureus - AEROBIC CULT, GRAM STAIN POSITIVE 1    VANCOMYCIN <=0.5 Sensitive     CIPROFLOXACIN >=8 Resistant     CLINDAMYCIN <=0.25 Sensitive     LEVOFLOXACIN 4 Intermediate     ERYTHROMYCIN >=8 Resistant     GENTAMICIN <=0.5 Sensitive     OXACILLIN* 0.5 Sensitive      * Oxacillin susceptible staphylococci are susceptible to other penicillinase-stable penicillins (e.g., methicillin, nafcillin), beta- lactam/beta-lactamase inhibitor combinations, and cephems with staphylococcal indications, including cefazolin.     TETRACYCLINE <=1 Sensitive  TRIMETH/SULFA* <=10 Sensitive      * Oxacillin susceptible staphylococci are susceptible to other penicillinase-stable penicillins (e.g., methicillin, nafcillin), beta- lactam/beta-lactamase inhibitor combinations, and cephems  with staphylococcal indications, including cefazolin. Legend: S = Susceptible  I = Intermediate R = Resistant  NS = Not susceptible * = Not tested  NR = Not reported **NN = See antimicrobic comments     Labs: CBC: Recent Labs  Lab 06/19/22 1021 06/20/22 0717  WBC 5.6 5.4  NEUTROABS 2.6  --   HGB 12.9 13.3  HCT 40.3 41.0  MCV 93.5 93.0  PLT 265 276   Basic Metabolic Panel: Recent Labs  Lab 06/19/22 1021 06/20/22 0717  NA 138 137  K 5.0 3.6  CL 106 108  CO2 26 23  GLUCOSE 110* 142*  BUN 20 16  CREATININE 0.92 0.74  CALCIUM 9.3 9.3   Liver Function Tests: Recent Labs  Lab 06/19/22 1021  AST 44*  ALT 28  ALKPHOS 49  BILITOT 0.5  PROT 7.1  ALBUMIN 3.9   CBG: Recent Labs  Lab 06/19/22 1201 06/20/22 0651 06/20/22 1152  GLUCAP 81 132* 126*    Discharge time spent: 39 minutes  Signed: Kathlen Mody, MD [facility] 06/21/2022

## 2022-06-22 ENCOUNTER — Telehealth: Payer: Self-pay

## 2022-06-22 NOTE — Telephone Encounter (Signed)
-----   Message from [name], PA-C sent at 06/20/2022  3:12 PM EDT ----- Regarding: Same Day Discharge LOOP 06/20/22 Dr. [name] Fortis

## 2022-06-22 NOTE — Telephone Encounter (Signed)
  Loop Recorder Follow up   Is patient connected to [facility]? Yes   Have steri-strips fallen off or been removed? No   Does the patient need in office follow up? No   Please continue to monitor your cardiac device site for redness, swelling, and drainage. Call the device clinic at [phone] if you experience these symptoms, fever/chills, or have questions about your device.   Remote monitoring is used to monitor your cardiac device from home. This monitoring is scheduled every month by our office. It allows us to keep an eye on the functioning of your device to ensure it is working properly.  

## 2022-06-29 DIAGNOSIS — I639 Cerebral infarction, unspecified: Secondary | ICD-10-CM | POA: Diagnosis not present

## 2022-07-04 DIAGNOSIS — I639 Cerebral infarction, unspecified: Secondary | ICD-10-CM | POA: Diagnosis not present

## 2022-07-05 ENCOUNTER — Encounter: Payer: Self-pay | Admitting: Family Medicine

## 2022-07-05 ENCOUNTER — Ambulatory Visit (INDEPENDENT_AMBULATORY_CARE_PROVIDER_SITE_OTHER): Payer: Medicare Other | Admitting: Family Medicine

## 2022-07-05 VITALS — BP 110/82 | HR 83 | Temp 98.0°F | Resp 18 | Ht 65.0 in | Wt 235.6 lb

## 2022-07-05 DIAGNOSIS — I10 Essential (primary) hypertension: Secondary | ICD-10-CM | POA: Diagnosis not present

## 2022-07-05 DIAGNOSIS — E1169 Type 2 diabetes mellitus with other specified complication: Secondary | ICD-10-CM | POA: Diagnosis not present

## 2022-07-05 DIAGNOSIS — I69354 Hemiplegia and hemiparesis following cerebral infarction affecting left non-dominant side: Secondary | ICD-10-CM | POA: Diagnosis not present

## 2022-07-05 DIAGNOSIS — E785 Hyperlipidemia, unspecified: Secondary | ICD-10-CM

## 2022-07-05 DIAGNOSIS — E038 Other specified hypothyroidism: Secondary | ICD-10-CM | POA: Diagnosis not present

## 2022-07-05 DIAGNOSIS — E1165 Type 2 diabetes mellitus with hyperglycemia: Secondary | ICD-10-CM

## 2022-07-05 DIAGNOSIS — I639 Cerebral infarction, unspecified: Secondary | ICD-10-CM

## 2022-07-05 MED ORDER — LEVOTHYROXINE SODIUM 125 MCG PO TABS
125.0000 ug | ORAL_TABLET | Freq: Every day | ORAL | 3 refills | Status: DC
Start: 2022-07-05 — End: 2023-03-19

## 2022-07-05 MED ORDER — ATORVASTATIN CALCIUM 80 MG PO TABS
80.0000 mg | ORAL_TABLET | Freq: Every day | ORAL | 1 refills | Status: DC
Start: 2022-07-05 — End: 2022-10-29

## 2022-07-05 NOTE — Patient Instructions (Signed)
Rehabilitation After a Stroke Rehabilitation, also called rehab, can help you gain more independence after a stroke. It can improve your strength. It can also help with your ability to walk, speak, and swallow. Early rehab may be the most helpful. What is stroke rehab? Strokes affect people in different ways. You may need rehab in a hospital or at a skilled nursing facility. You may also receive rehab at home. Rehab may happen over a few months. Rehab teams are often led by a health care provider who is an expert in rehab after a stroke. They help to prevent and treat: Contracture. This is when your muscles and tendons tighten. It can cause your joints to become stiff. Bed sores. These are also known as pressure wounds. Deep vein thrombosis. This is when blood clots form in your veins. It often happens in your legs or thighs. Future strokes. Pain. The inability to control when you urinate or have bowel movements (incontinence). Post-stroke depression. Other rehab team members may include: Therapists. Psychologists. Social workers. Nurses. Types of rehab Physical therapy Physical therapy can help you improve your coordination, balance, and muscle strength. It may involve: Range-of-motion exercises. Exercises to strengthen the muscles used for standing, walking, and other activities. Moving between lying, sitting, and standing. Walking with a cane or walker, if needed. Using stairs.  Occupational therapy Occupational therapy can help you rebuild your skills to do daily tasks. These tasks may include brushing your teeth, going to the bathroom, eating, bathing, and getting dressed. This type of therapy may also help with: Vision. Visual scanning is a technique that is used to prevent falls. Memory and cognitive training. You may learn problem-solving skills and relearn some tasks, such as how to make a phone call. Fine muscle movements. This may include buttoning a shirt or picking up small  objects. Speech-language therapy Speech-language therapy can help you communicate. After a stroke, you may have trouble understanding what people are saying. You may also have trouble writing, speaking, or finding the right word for what you want to say. You may also need speech therapy if you have trouble swallowing when you eat or drink. This type of therapy may include: Ways to strengthen the muscles you use when you swallow. Naming objects or describing pictures. This helps retrain the brain to recognize and remember words. Exercises to strengthen the muscles used for talking. This includes your tongue and lips. Exercises to retrain your brain to understand what you read and hear. General information  Take over-the-counter and prescription medicines only as told by your health care provider. Do not use any products that contain nicotine or tobacco. These include cigarettes, chewing tobacco, and vaping devices, such as e-cigarettes. If you need help quitting, ask your health care provider. Try to involve your family and friends in your recovery. It can help to have others encourage you. Stay connected with your friends, family, and community. This can help you to not feel isolated after a stroke. Do exercises as told by your health care provider. Keep all follow-up visits to make sure all your needs are being met and to catch any new problems early. This information is not intended to replace advice given to you by your health care provider. Make sure you discuss any questions you have with your health care provider. Document Revised: 07/13/2021 Document Reviewed: 07/13/2021 Elsevier Patient Education  2023 Elsevier Inc.  

## 2022-07-05 NOTE — Assessment & Plan Note (Signed)
Well controlled, no changes to meds. Encouraged heart healthy diet such as the DASH diet and exercise as tolerated.  °

## 2022-07-05 NOTE — Assessment & Plan Note (Signed)
Tolerating statin, encouraged heart healthy diet, avoid trans fats, minimize simple carbs and saturated fats. Increase exercise as tolerated 

## 2022-07-05 NOTE — Assessment & Plan Note (Signed)
Lab Results  Component Value Date   TSH 5.844 (H) 06/20/2022   Synthroid adjusted --- recheck 2 months

## 2022-07-05 NOTE — Assessment & Plan Note (Signed)
Stable  Pt to f/u neuro

## 2022-07-05 NOTE — Assessment & Plan Note (Signed)
hgba1c to be checked, minimize simple carbs. Increase exercise as tolerated. Continue current meds  

## 2022-07-05 NOTE — Progress Notes (Signed)
Subjective:   By signing my name below, I, [name], attest that this documentation has been prepared under the direction and in the presence of Donato Schultz, DO. 07/05/2022   Patient ID: Amy Mahoney, female    DOB: [birth year], 72 y.o.   MRN: [id number]  Chief Complaint  Patient presents with   Hospitalization Follow-up    Stroke,   Rash    HPI Patient is in today for a hospital follow up visit.   She was admitted to the hospital from the ED on 06/19/2022 for a cerebrovascular accident. She reports waking up Sunday morning with right hand numbness that subsided and woke up the next days with her left hand numb which subsided. She later developed numbness around her mouth and then seen an ED and was found to be having a stroke. She was then transported to the hospital for treatment. She was found to be having a stroke preceded by mini strokes but could not find the cause. She had a monitor in place to help find the cause. She continues following up with her cardiologist to manage her condition. She was instructed to follow up with a neurologist but was not given a referral. Since her stroke she notices her left leg is slightly weaker than her right leg. She is seeing a physical therapist to help manage her symptoms. Her main difficulty is maintaining balance.  She continues having occasional brief episodes of tingling.    Past Medical History:  Diagnosis Date   Abnormal esophagram    Abscess of skin 04/15/2015   Anxiety    Back pain    Bacterial vaginal infection 12/17/2014   Bronchitis with bronchospasm 04/14/2013   Chest heaviness 07/11/2020   Class 3 severe obesity with serious comorbidity and body mass index (BMI) of 40.0 to 44.9 in adult [facility]) 06/10/2018   Constipation    Cough 09/17/2013   DEPRESSION 09/05/2006   Qualifier: Diagnosis of  By: Janit Bern     Depression with anxiety 07/11/2020   Diabetes mellitus without complication (HCC)    DIZZINESS, CHRONIC  05/23/2006   Qualifier: Diagnosis of  By: Janit Bern     Dysphagia    FATIGUE 06/01/2009   Qualifier: Diagnosis of  By: Janit Bern     Gallbladder problem    Gastroesophageal reflux disease 05/24/2017   Heart murmur    Hyperlipidemia    Hyperlipidemia associated with type 2 diabetes mellitus (HCC) 07/11/2020   Hyperlipidemia LDL goal <100 11/06/2012   Hypothyroidism 05/23/2006   Qualifier: Diagnosis of  By: Janit Bern     Lichen planus 12/01/2020   MORBID OBESITY 03/30/2008   Qualifier: Diagnosis of  By: Janit Bern     Muscle spasm of back 11/19/2014   Obstructive sleep apnea 05/23/2006   NPSG 08/03/94 RDI/AHI 49/hr, weight was 285 pounds CPAP/ Advanced    Paronychia of index finger 08/08/2021   Pharyngitis 03/03/2017   Post-nasal drainage 08/03/2019   Last Assessment & Plan:  Formatting of this note might be different from the original. Concern over postnasal drainage. Chronic at least a year history of the above but over the last month seems to have worsened.  She has tried antihistamine therapy without improvement.  Denies obvious heartburn.  Some associated cough and occasional near emesis episodes.  She is not on an ACE inhibitor. EXAM show   Prediabetes    Preventative health care 01/10/2021   Primary hypertension 07/11/2020  Spider bite    Subungual hematoma of fifth toe of right foot 10/29/2017   Swelling    Type 2 diabetes mellitus with hyperglycemia (HCC) 06/10/2018    Past Surgical History:  Procedure Laterality Date   CHOLECYSTECTOMY     DILATION AND CURETTAGE OF UTERUS     ESOPHAGOGASTRODUODENOSCOPY (EGD) WITH PROPOFOL N/A 09/05/2017   Procedure: ESOPHAGOGASTRODUODENOSCOPY (EGD) WITH PROPOFOL;  Surgeon: Beverley Fiedler, MD;  Location: [facility] ENDOSCOPY;  Service: Gastroenterology;  Laterality: N/A;   fibroids scrapping     LOOP RECORDER INSERTION N/A 06/20/2022   Procedure: LOOP RECORDER INSERTION;  Surgeon: Regan Lemming, MD;  Location: [facility] INVASIVE CV LAB;   Service: Cardiovascular;  Laterality: N/A;   TONSILLECTOMY      Family History  Problem Relation Age of Onset   Diabetes Mother    Thyroid disease Mother    Depression Mother    Anxiety disorder Brother    Breast cancer Paternal Grandmother    Diabetes Maternal Aunt    Lung cancer Maternal Aunt        Nonsmoker   Cancer Maternal Aunt        breast   Breast cancer Maternal Aunt    Cancer Maternal Aunt        lung(nonsmoker)   Depression Other    Hyperlipidemia Other    Arthritis Other    Colon cancer Neg Hx    Pancreatic cancer Neg Hx    Stomach cancer Neg Hx    Esophageal cancer Neg Hx    Rectal cancer Neg Hx     Social History   Socioeconomic History   Marital status: Divorced    Spouse name: Not on file   Number of children: 0   Years of education: Not on file   Highest education level: Master's degree (e.g., MA, MS, MEng, MEd, MSW, MBA)  Occupational History   Occupation: Eps Midwife: [employer]    Employer: [employer]   Occupation: RETIRED  Tobacco Use   Smoking status: Former    Packs/day: 0.50    Years: 3.00    Additional pack years: 0.00    Total pack years: 1.50    Types: Cigarettes    Quit date: 08/10/1990    Years since quitting: 31.9   Smokeless tobacco: Never  Vaping Use   Vaping Use: Never used  Substance and Sexual Activity   Alcohol use: Yes    Comment: rarely   Drug use: No   Sexual activity: Not Currently    Partners: Male  Other Topics Concern   Not on file  Social History Narrative   Regular exercise - yes-- 10,000 steps   Caffeine use: 2 cups of coffee daily   Live  in [place] with 86 year old father to assist in care. Will be moving in with brother and his wife into larger home to facilitate better care.    Social Determinants of Health   Financial Resource Strain: Low Risk  (05/27/2022)   Overall Financial Resource Strain (CARDIA)    Difficulty of Paying Living Expenses: Not hard at all  Food  Insecurity: No Food Insecurity (06/19/2022)   Hunger Vital Sign    Worried About Running Out of Food in the Last Year: Never true    Ran Out of Food in the Last Year: Never true  Transportation Needs: No Transportation Needs (06/19/2022)   PRAPARE - Transportation    Lack of Transportation (Medical): No    Lack of  Transportation (Non-Medical): No  Physical Activity: Insufficiently Active (05/27/2022)   Exercise Vital Sign    Days of Exercise per Week: 3 days    Minutes of Exercise per Session: 20 min  Stress: No Stress Concern Present (05/27/2022)   [employer] of Occupational Health - Occupational Stress Questionnaire    Feeling of Stress : Only a little  Social Connections: Moderately Integrated (05/27/2022)   Social Connection and Isolation Panel [NHANES]    Frequency of Communication with Friends and Family: More than three times a week    Frequency of Social Gatherings with Friends and Family: More than three times a week    Attends Religious Services: More than 4 times per year    Active Member of [employer] or Organizations: Yes    Attends Engineer, structural: More than 4 times per year    Marital Status: Divorced  Intimate Partner Violence: Not At Risk (06/19/2022)   Humiliation, Afraid, Rape, and Kick questionnaire    Fear of Current or Ex-Partner: No    Emotionally Abused: No    Physically Abused: No    Sexually Abused: No    Outpatient Medications Prior to Visit  Medication Sig Dispense Refill   aspirin EC 81 MG tablet Take 1 tablet (81 mg total) by mouth daily. Swallow whole. 30 tablet 12   B-D UF III MINI PEN NEEDLES 31G X 5 MM MISC USE AS DIRECTED 100 each 3   Calcium Carbonate-Vitamin D (CALCIUM 500 + D PO) Take 1 tablet by mouth 2 (two) times daily.     clonazePAM (KLONOPIN) 0.5 MG tablet TAKE 1 TABLET BY MOUTH 2 TIMES DAILY AS NEEDED FOR ANXIETY. 30 tablet 3   clopidogrel (PLAVIX) 75 MG tablet Take 1 tablet (75 mg total) by mouth daily for 21 days. 21 tablet 0    fenofibrate 160 MG tablet TAKE 1 TABLET BY MOUTH DAILY 100 tablet 2   levothyroxine (SYNTHROID) 112 MCG tablet TAKE 1 TABLET BY MOUTH DAILY  BEFORE BREAKFAST 100 tablet 2   lisinopril (ZESTRIL) 5 MG tablet TAKE 1 TABLET BY MOUTH DAILY 90 tablet 1   Multiple Vitamin (MULTIVITAMIN) tablet Take 1 tablet by mouth daily.     omeprazole (PRILOSEC) 40 MG capsule Take 40 mg by mouth daily.     ONETOUCH ULTRA test strip USE AS DIRECTED UP TO 4 TIMES DAILY 100 strip 1   tirzepatide (MOUNJARO) 7.5 MG/0.5ML Pen Inject 7.5 mg into the skin once a week. 6 mL 3   TRESIBA FLEXTOUCH 100 UNIT/ML FlexTouch Pen INJECT SUBCUTANEOUSLY 10 UNITS  DAILY 15 mL 2   venlafaxine (EFFEXOR) 37.5 MG tablet Take 1 tablet (37.5 mg total) by mouth daily. 90 tablet 1   venlafaxine XR (EFFEXOR-XR) 150 MG 24 hr capsule Take 1 capsule (150 mg total) by mouth daily. 90 capsule 3   Vitamin D, Ergocalciferol, (DRISDOL) 1.25 MG (50000 UNIT) CAPS capsule TAKE 1 CAPSULE BY MOUTH EVERY 7  DAYS 15 capsule 2   atorvastatin (LIPITOR) 80 MG tablet Take 1 tablet (80 mg total) by mouth daily. 30 tablet 11   hydrocortisone cream 1 % Apply to affected area 2 times daily 15 g 0   No facility-administered medications prior to visit.    No Known Allergies  Review of Systems  Constitutional:  Negative for fever and malaise/fatigue.  HENT:  Negative for congestion.   Eyes:  Negative for blurred vision.  Respiratory:  Negative for shortness of breath.   Cardiovascular:  Negative for chest  pain, palpitations and leg swelling.  Gastrointestinal:  Negative for abdominal pain, blood in stool and nausea.  Genitourinary:  Negative for dysuria and frequency.  Musculoskeletal:  Negative for falls.  Skin:  Negative for rash.  Neurological:  Negative for dizziness, loss of consciousness and headaches.  Endo/Heme/Allergies:  Negative for environmental allergies.  Psychiatric/Behavioral:  Negative for depression. The patient is not nervous/anxious.         Objective:    Physical Exam Vitals and nursing note reviewed.  Constitutional:      General: She is not in acute distress.    Appearance: Normal appearance. She is not ill-appearing.  HENT:     Head: Normocephalic and atraumatic.     Right Ear: External ear normal.     Left Ear: External ear normal.  Eyes:     Extraocular Movements: Extraocular movements intact.     Pupils: Pupils are equal, round, and reactive to light.  Cardiovascular:     Rate and Rhythm: Normal rate and regular rhythm.     Heart sounds: Normal heart sounds. No murmur heard.    No gallop.  Pulmonary:     Effort: Pulmonary effort is normal. No respiratory distress.     Breath sounds: Normal breath sounds. No wheezing or rales.  Skin:    General: Skin is warm and dry.  Neurological:     Mental Status: She is alert and oriented to person, place, and time.  Psychiatric:        Judgment: Judgment normal.     BP 110/82 (BP Location: Left Arm, Patient Position: Sitting, Cuff Size: Large)   Pulse 83   Temp 98 F (36.7 C) (Oral)   Resp 18   Ht 5\' 5"  (1.651 m)   Wt 235 lb 9.6 oz (106.9 kg)   SpO2 97%   BMI 39.21 kg/m  Wt Readings from Last 3 Encounters:  07/05/22 235 lb 9.6 oz (106.9 kg)  06/19/22 235 lb (106.6 kg)  05/28/22 239 lb 9.6 oz (108.7 kg)       Assessment & Plan:  Hyperlipidemia associated with type 2 diabetes mellitus (HCC) Assessment & Plan: Tolerating statin, encouraged heart healthy diet, avoid trans fats, minimize simple carbs and saturated fats. Increase exercise as tolerated   Orders: -     Atorvastatin Calcium; Take 1 tablet (80 mg total) by mouth daily.  Dispense: 90 tablet; Refill: 1  Cerebrovascular accident (CVA), unspecified mechanism (HCC) Assessment & Plan: Stable  Pt to f/u neuro   Orders: -     Ambulatory referral to Neurology  Other specified hypothyroidism Assessment & Plan: Lab Results  Component Value Date   TSH 5.844 (H) 06/20/2022   Synthroid  adjusted --- recheck 2 months  Orders: -     Levothyroxine Sodium; Take 1 tablet (125 mcg total) by mouth daily.  Dispense: 90 tablet; Refill: 3  Type 2 diabetes mellitus with hyperglycemia, without long-term current use of insulin (HCC) Assessment & Plan: [id number] to be checked ,  minimize simple carbs. Increase exercise as tolerated. Continue current meds    Primary hypertension Assessment & Plan: Well controlled, no changes to meds. Encouraged heart healthy diet such as the DASH diet and exercise as tolerated.       IDonato Schultz, DO, personally preformed the services described in this documentation.  All medical record entries made by the scribe were at my direction and in my presence.  I have reviewed the chart and discharge instructions (if applicable) and  agree that the record reflects my personal performance and is accurate and complete. 07/05/2022   I,[name],acting as a scribe for Donato Schultz, DO.,have documented all relevant documentation on the behalf of Donato Schultz, DO,as directed by  Donato Schultz, DO while in the presence of Donato Schultz, DO.   Donato Schultz, DO

## 2022-07-06 DIAGNOSIS — I639 Cerebral infarction, unspecified: Secondary | ICD-10-CM | POA: Diagnosis not present

## 2022-07-10 DIAGNOSIS — G4733 Obstructive sleep apnea (adult) (pediatric): Secondary | ICD-10-CM | POA: Diagnosis not present

## 2022-07-12 ENCOUNTER — Ambulatory Visit (HOSPITAL_BASED_OUTPATIENT_CLINIC_OR_DEPARTMENT_OTHER)
Admission: RE | Admit: 2022-07-12 | Discharge: 2022-07-12 | Disposition: A | Discharge status: Home / Self Care | Payer: Medicare Other | Source: Ambulatory Visit | Attending: Family Medicine | Admitting: Family Medicine

## 2022-07-12 DIAGNOSIS — Z780 Asymptomatic menopausal state: Secondary | ICD-10-CM | POA: Insufficient documentation

## 2022-07-17 DIAGNOSIS — I639 Cerebral infarction, unspecified: Secondary | ICD-10-CM | POA: Diagnosis not present

## 2022-07-19 ENCOUNTER — Ambulatory Visit: Payer: Medicare Other | Admitting: Adult Health

## 2022-07-19 ENCOUNTER — Encounter: Payer: Self-pay | Admitting: Adult Health

## 2022-07-19 VITALS — BP 116/67 | HR 67 | Ht 64.0 in | Wt 236.4 lb

## 2022-07-19 DIAGNOSIS — R7303 Prediabetes: Secondary | ICD-10-CM

## 2022-07-19 DIAGNOSIS — E782 Mixed hyperlipidemia: Secondary | ICD-10-CM

## 2022-07-19 DIAGNOSIS — I6340 Cerebral infarction due to embolism of unspecified cerebral artery: Secondary | ICD-10-CM

## 2022-07-19 NOTE — Patient Instructions (Signed)
Your Plan:  Continue ASA 81 mg daily   Blood pressure goal <130/90 Cholesterol LDL goal <70 Diabetes goal A1c <7 Monitor diet and try to exercise   Thank you for coming to see [name] at [facility]. I hope we have been able to provide you high quality care today.  You may receive a patient satisfaction survey over the next few weeks. We would appreciate your feedback and comments so that we may continue to improve ourselves and the health of our patients.

## 2022-07-19 NOTE — Progress Notes (Signed)
PATIENT: Amy Mahoney DOB: Dec 10, 1950  REASON FOR VISIT: follow up HISTORY FROM: patient PRIMARY NEUROLOGIST: Dr. Pearlean Brownie  Chief Complaint  Patient presents with   Follow-up    Pt in 18  Pt here for stroke f/u  Pt states at times finger tips tingle      HISTORY OF PRESENT ILLNESS: Today 07/19/22  Amy Mahoney is a 72 y.o. female here for hospital follow-up due to stroke. Returns today for follow-up.  She reports that overall she has been doing well.  She still has what she perceives as weakness in the left leg and feels a little off balance.  She is doing physical therapy.  She has followed up with her primary care.  She has a loop recorder.  He is currently only on aspirin 81 mg daily.  She returns today for an evaluation.  MRI brain with and without contrast: IMPRESSION: 1. Patchy acute small vessel infarct in the LEFT pons, but also small acute lacunar infarct of the Right Thalamus, and additional recent punctate ischemia also suspected in the anterior right frontal lobe and left caudate nucleus. This multiple vascular territory involvement raises suspicion of recent embolic event. Synchronous small vessel disease might also have this appearance - but little to no evidence of chronic small vessel ischemia (see #3).   2. No associated hemorrhage or intracranial mass effect.   3. Elsewhere normal for age MRI appearance of the brain.  CT angio head and neck with and without contrast: IMPRESSION: No large vessel occlusion or proximal hemodynamically significant stenosis.  Echocardiogram: Reviewed results reviewed in epic  HISTORY Amy Mahoney is a 72 y.o. female with history of hypertension, hyperlipidemia, diabetes, OSA on CPAP, obesity admitted for transient right hand numbness followed by left hand numbness and left perioral numbness. No tPA given due to symptom resolved.     Stroke: Multifocal infarcts, embolic pattern concerning for cardioembolic source CT  questionable right pontine infarct CTA head and neck unremarkable MRI left dorsal pontine infarct, right thalamic, left caudate head and right frontal white matter infarcts. 2D Echo EF 65 to 70% LE venous Doppler no DVT Loop recorder placed LDL 82  Subjective may have strokelike symptoms which could be numbness tingling weakness slurred speech changes with her balance is all of a sudden go to the emergency room HgbA1c 6.3 Lovenox for VTE prophylaxis No antithrombotic prior to admission, now on aspirin 81 mg daily and clopidogrel 75 mg daily DAPT for 3 weeks and then aspirin alone Patient counseled to be compliant with her antithrombotic medications Ongoing aggressive stroke risk factor management Therapy recommendations: None Disposition: Home today   REVIEW OF SYSTEMS: Out of a complete 14 system review of symptoms, the patient complains only of the following symptoms, and all other reviewed systems are negative.  ALLERGIES: No Known Allergies  HOME MEDICATIONS: Outpatient Medications Prior to Visit  Medication Sig Dispense Refill   aspirin EC 81 MG tablet Take 1 tablet (81 mg total) by mouth daily. Swallow whole. 30 tablet 12   atorvastatin (LIPITOR) 80 MG tablet Take 1 tablet (80 mg total) by mouth daily. 90 tablet 1   B-D UF III MINI PEN NEEDLES 31G X 5 MM MISC USE AS DIRECTED 100 each 3   Calcium Carbonate-Vitamin D (CALCIUM 500 + D PO) Take 1 tablet by mouth 2 (two) times daily.     clonazePAM (KLONOPIN) 0.5 MG tablet TAKE 1 TABLET BY MOUTH 2 TIMES DAILY AS NEEDED FOR ANXIETY.  30 tablet 3   fenofibrate 160 MG tablet TAKE 1 TABLET BY MOUTH DAILY 100 tablet 2   levothyroxine (SYNTHROID) 112 MCG tablet TAKE 1 TABLET BY MOUTH DAILY  BEFORE BREAKFAST 100 tablet 2   levothyroxine (SYNTHROID) 125 MCG tablet Take 1 tablet (125 mcg total) by mouth daily. 90 tablet 3   lisinopril (ZESTRIL) 5 MG tablet TAKE 1 TABLET BY MOUTH DAILY 90 tablet 1   Multiple Vitamin (MULTIVITAMIN) tablet  Take 1 tablet by mouth daily.     omeprazole (PRILOSEC) 40 MG capsule Take 40 mg by mouth daily.     ONETOUCH ULTRA test strip USE AS DIRECTED UP TO 4 TIMES DAILY 100 strip 1   tirzepatide (MOUNJARO) 7.5 MG/0.5ML Pen Inject 7.5 mg into the skin once a week. 6 mL 3   TRESIBA FLEXTOUCH 100 UNIT/ML FlexTouch Pen INJECT SUBCUTANEOUSLY 10 UNITS  DAILY 15 mL 2   venlafaxine (EFFEXOR) 37.5 MG tablet Take 1 tablet (37.5 mg total) by mouth daily. 90 tablet 1   venlafaxine XR (EFFEXOR-XR) 150 MG 24 hr capsule Take 1 capsule (150 mg total) by mouth daily. 90 capsule 3   Vitamin D, Ergocalciferol, (DRISDOL) 1.25 MG (50000 UNIT) CAPS capsule TAKE 1 CAPSULE BY MOUTH EVERY 7  DAYS 15 capsule 2   No facility-administered medications prior to visit.    PAST MEDICAL HISTORY: Past Medical History:  Diagnosis Date   Abnormal esophagram    Abscess of skin 04/15/2015   Anxiety    Back pain    Bacterial vaginal infection 12/17/2014   Bronchitis with bronchospasm 04/14/2013   Chest heaviness 07/11/2020   Class 3 severe obesity with serious comorbidity and body mass index (BMI) of 40.0 to 44.9 in adult [facility]) 06/10/2018   Constipation    Cough 09/17/2013   DEPRESSION 09/05/2006   Qualifier: Diagnosis of  By: Janit Bern     Depression with anxiety 07/11/2020   Diabetes mellitus without complication (HCC)    DIZZINESS, CHRONIC 05/23/2006   Qualifier: Diagnosis of  By: Janit Bern     Dysphagia    FATIGUE 06/01/2009   Qualifier: Diagnosis of  By: Janit Bern     Gallbladder problem    Gastroesophageal reflux disease 05/24/2017   Heart murmur    Hyperlipidemia    Hyperlipidemia associated with type 2 diabetes mellitus (HCC) 07/11/2020   Hyperlipidemia LDL goal <100 11/06/2012   Hypothyroidism 05/23/2006   Qualifier: Diagnosis of  By: Janit Bern     Lichen planus 12/01/2020   MORBID OBESITY 03/30/2008   Qualifier: Diagnosis of  By: Janit Bern     Muscle spasm of back 11/19/2014   Obstructive  sleep apnea 05/23/2006   NPSG 08/03/94 RDI/AHI 49/hr, weight was 285 pounds CPAP/ Advanced    Paronychia of index finger 08/08/2021   Pharyngitis 03/03/2017   Post-nasal drainage 08/03/2019   Last Assessment & Plan:  Formatting of this note might be different from the original. Concern over postnasal drainage. Chronic at least a year history of the above but over the last month seems to have worsened.  She has tried antihistamine therapy without improvement.  Denies obvious heartburn.  Some associated cough and occasional near emesis episodes.  She is not on an ACE inhibitor. EXAM show   Prediabetes    Preventative health care 01/10/2021   Primary hypertension 07/11/2020   Spider bite    Subungual hematoma of fifth toe of right foot 10/29/2017   Swelling  Type 2 diabetes mellitus with hyperglycemia (HCC) 06/10/2018    PAST SURGICAL HISTORY: Past Surgical History:  Procedure Laterality Date   CHOLECYSTECTOMY     DILATION AND CURETTAGE OF UTERUS     ESOPHAGOGASTRODUODENOSCOPY (EGD) WITH PROPOFOL N/A 09/05/2017   Procedure: ESOPHAGOGASTRODUODENOSCOPY (EGD) WITH PROPOFOL;  Surgeon: Beverley Fiedler, MD;  Location: [facility] ENDOS[facility];  Service: Gastroenterology;  Laterality: N/A;   fibroids scrapping     LOOP RECORDER INSERTION N/A 06/20/2022   Procedure: LOOP RECORDER INSERTION;  Surgeon: Regan Lemming, MD;  Location: [facility] INVASIVE CV LAB;  Service: Cardiovascular;  Laterality: N/A;   TONSILLECTOMY      FAMILY HISTORY: Family History  Problem Relation Age of Onset   Diabetes Mother    Thyroid disease Mother    Depression Mother    Anxiety disorder Brother    Diabetes Maternal Aunt    Lung cancer Maternal Aunt        Nonsmoker   Cancer Maternal Aunt        breast   Breast cancer Maternal Aunt    Cancer Maternal Aunt        lung(nonsmoker)   Breast cancer Paternal Grandmother    Depression Other    Hyperlipidemia Other    Arthritis Other    Colon cancer Neg Hx    Pancreatic cancer Neg Hx     Stomach cancer Neg Hx    Esophageal cancer Neg Hx    Rectal cancer Neg Hx    Stroke Neg Hx     SOCIAL HISTORY: Social History   Socioeconomic History   Marital status: Divorced    Spouse name: Not on file   Number of children: 0   Years of education: Not on file   Highest education level: Master's degree (e.g., MA, MS, MEng, MEd, MSW, MBA)  Occupational History   Occupation: Eps Midwife: [employer]    Employer: [employer]   Occupation: RETIRED  Tobacco Use   Smoking status: Former    Packs/day: 0.50    Years: 3.00    Additional pack years: 0.00    Total pack years: 1.50    Types: Cigarettes    Quit date: 08/10/1990    Years since quitting: 31.9   Smokeless tobacco: Never  Vaping Use   Vaping Use: Never used  Substance and Sexual Activity   Alcohol use: Yes    Comment: rarely   Drug use: No   Sexual activity: Not Currently    Partners: Male  Other Topics Concern   Not on file  Social History Narrative   Regular exercise - yes-- 10,000 steps   Caffeine use: 2 cups of coffee daily   Live  in [place] with 30 year old father to assist in care. Will be moving in with brother and his wife into larger home to facilitate better care.    Social Determinants of Health   Financial Resource Strain: Low Risk  (05/27/2022)   Overall Financial Resource Strain (CARDIA)    Difficulty of Paying Living Expenses: Not hard at all  Food Insecurity: No Food Insecurity (06/19/2022)   Hunger Vital Sign    Worried About Running Out of Food in the Last Year: Never true    Ran Out of Food in the Last Year: Never true  Transportation Needs: No Transportation Needs (06/19/2022)   PRAPARE - Administrator, Civil Service (Medical): No    Lack of Transportation (Non-Medical): No  Physical Activity: Insufficiently Active (  05/27/2022)   Exercise Vital Sign    Days of Exercise per Week: 3 days    Minutes of Exercise per Session: 20 min  Stress: No Stress  Concern Present (05/27/2022)   [employer] of Occupational Health - Occupational Stress Questionnaire    Feeling of Stress : Only a little  Social Connections: Moderately Integrated (05/27/2022)   Social Connection and Isolation Panel [NHANES]    Frequency of Communication with Friends and Family: More than three times a week    Frequency of Social Gatherings with Friends and Family: More than three times a week    Attends Religious Services: More than 4 times per year    Active Member of [employer] or Organizations: Yes    Attends Banker Meetings: More than 4 times per year    Marital Status: Divorced  Intimate Partner Violence: Not At Risk (06/19/2022)   Humiliation, Afraid, Rape, and Kick questionnaire    Fear of Current or Ex-Partner: No    Emotionally Abused: No    Physically Abused: No    Sexually Abused: No      PHYSICAL EXAM  Vitals:   07/19/22 1016  BP: 116/67  Pulse: 67  Weight: 236 lb 6.4 oz (107.2 kg)  Height: 5\' 4"  (1.626 m)   Body mass index is 40.58 kg/m.  Generalized: Well developed, in no acute distress   Neurological examination  Mentation: Alert oriented to time, place, history taking. Follows all commands speech and language fluent Cranial nerve II-XII: Pupils were equal round reactive to light. Extraocular movements were full, visual field were full on confrontational test. Facial sensation and strength were normal. Head turning and shoulder shrug  were normal and symmetric. Motor: The motor testing reveals 5 over 5 strength of all 4 extremities. Good symmetric motor tone is noted throughout.  Sensory: Sensory testing is intact to soft touch on all 4 extremities. No evidence of extinction is noted.  Coordination: Cerebellar testing reveals good finger-nose-finger and heel-to-shin bilaterally.  Gait and station: Gait is normal.  Reflexes: Deep tendon reflexes are symmetric and normal bilaterally.   DIAGNOSTIC DATA (LABS, IMAGING, TESTING) - I  reviewed patient records, labs, notes, testing and imaging myself where available.  Lab Results  Component Value Date   WBC 5.4 06/20/2022   HGB 13.3 06/20/2022   HCT 41.0 06/20/2022   MCV 93.0 06/20/2022   PLT 276 06/20/2022      Component Value Date/Time   NA 137 06/20/2022 0717   NA 142 10/06/2018 1039   K 3.6 06/20/2022 0717   CL 108 06/20/2022 0717   CO2 23 06/20/2022 0717   GLUCOSE 142 (H) 06/20/2022 0717   BUN 16 06/20/2022 0717   BUN 26 10/06/2018 1039   CREATININE 0.74 06/20/2022 0717   CALCIUM 9.3 06/20/2022 0717   PROT 7.1 06/19/2022 1021   PROT 6.7 10/06/2018 1039   ALBUMIN 3.9 06/19/2022 1021   ALBUMIN 4.3 10/06/2018 1039   AST 44 (H) 06/19/2022 1021   ALT 28 06/19/2022 1021   ALKPHOS 49 06/19/2022 1021   BILITOT 0.5 06/19/2022 1021   BILITOT 0.3 10/06/2018 1039   GFRNONAA >60 06/20/2022 0717   GFRAA 78 10/06/2018 1039   Lab Results  Component Value Date   CHOL 131 06/20/2022   HDL 27 (L) 06/20/2022   LDLCALC 82 06/20/2022   LDLDIRECT 193.3 11/06/2012   TRIG 109 06/20/2022   CHOLHDL 4.9 06/20/2022   Lab Results  Component Value Date   HGBA1C 6.3 05/28/2022  Lab Results  Component Value Date   VITAMINB12 724 10/06/2018   Lab Results  Component Value Date   TSH 5.844 (H) 06/20/2022      ASSESSMENT AND PLAN 72 y.o. year old female  has a past medical history of Abnormal esophagram, Abscess of skin (04/15/2015), Anxiety, Back pain, Bacterial vaginal infection (12/17/2014), Bronchitis with bronchospasm (04/14/2013), Chest heaviness (07/11/2020), Class 3 severe obesity with serious comorbidity and body mass index (BMI) of 40.0 to 44.9 in adult [facility]) (06/10/2018), Constipation, Cough (09/17/2013), DEPRESSION (09/05/2006), Depression with anxiety (07/11/2020), Diabetes mellitus without complication (HCC), DIZZINESS, CHRONIC (05/23/2006), Dysphagia, FATIGUE (06/01/2009), Gallbladder problem, Gastroesophageal reflux disease (05/24/2017), Heart murmur,  Hyperlipidemia, Hyperlipidemia associated with type 2 diabetes mellitus (HCC) (07/11/2020), Hyperlipidemia LDL goal <100 (11/06/2012), Hypothyroidism (05/23/2006), Lichen planus (12/01/2020), MORBID OBESITY (03/30/2008), Muscle spasm of back (11/19/2014), Obstructive sleep apnea (05/23/2006), Paronychia of index finger (08/08/2021), Pharyngitis (03/03/2017), Post-nasal drainage (08/03/2019), Prediabetes, Preventative health care (01/10/2021), Primary hypertension (07/11/2020), Spider bite, Subungual hematoma of fifth toe of right foot (10/29/2017), Swelling, and Type 2 diabetes mellitus with hyperglycemia (HCC) (06/10/2018). here with:  Stroke: Multifocal infarcts- left dorsal pontine infarct, right thalamic, left caudate head and right frontal white matter infarcts. embolic pattern concerning for cardioembolic source   Continue aspirin 81 mg daily  for secondary stroke prevention.  Discussed secondary stroke prevention measures and importance of close PCP follow up for aggressive stroke risk factor management. I have gone over the pathophysiology of stroke, warning signs and symptoms, risk factors and their management in some detail with instructions to go to the closest emergency room for symptoms of concern. HTN: BP goal <130/90.   HLD: LDL goal <70. Recent LDL 82 continue Fenofibrate 160 mg and Lipitor 80 mg daily. DMII: A1c goal<7.0. Recent A1c 6.3.  Encouraged patient to monitor diet and encouraged exercise FU with our office PRN      Butch Penny, MSN, NP-C 07/19/2022, 10:36 AM [facility] [address], [address] [place], [place] [address] [phone]

## 2022-07-23 DIAGNOSIS — I639 Cerebral infarction, unspecified: Secondary | ICD-10-CM | POA: Diagnosis not present

## 2022-07-24 ENCOUNTER — Ambulatory Visit: Payer: Medicare Other

## 2022-07-24 DIAGNOSIS — I639 Cerebral infarction, unspecified: Secondary | ICD-10-CM

## 2022-07-25 LAB — CUP PACEART REMOTE DEVICE CHECK
Date Time Interrogation Session: 20240604174515
Implantable Pulse Generator Implant Date: 20240501

## 2022-07-26 ENCOUNTER — Other Ambulatory Visit: Payer: Self-pay | Admitting: Family Medicine

## 2022-07-26 DIAGNOSIS — E1165 Type 2 diabetes mellitus with hyperglycemia: Secondary | ICD-10-CM

## 2022-07-27 DIAGNOSIS — I639 Cerebral infarction, unspecified: Secondary | ICD-10-CM | POA: Diagnosis not present

## 2022-08-02 ENCOUNTER — Encounter: Payer: Self-pay | Admitting: Behavioral Health

## 2022-08-02 ENCOUNTER — Ambulatory Visit: Payer: Medicare Other | Admitting: Behavioral Health

## 2022-08-02 DIAGNOSIS — F411 Generalized anxiety disorder: Secondary | ICD-10-CM

## 2022-08-02 DIAGNOSIS — F330 Major depressive disorder, recurrent, mild: Secondary | ICD-10-CM | POA: Diagnosis not present

## 2022-08-02 DIAGNOSIS — F321 Major depressive disorder, single episode, moderate: Secondary | ICD-10-CM

## 2022-08-02 DIAGNOSIS — F331 Major depressive disorder, recurrent, moderate: Secondary | ICD-10-CM

## 2022-08-02 MED ORDER — VENLAFAXINE HCL ER 150 MG PO CP24
150.0000 mg | ORAL_CAPSULE | Freq: Every day | ORAL | 3 refills | Status: DC
Start: 2022-08-02 — End: 2022-09-11

## 2022-08-02 MED ORDER — VENLAFAXINE HCL 37.5 MG PO TABS: 37.5000 mg | ORAL_TABLET | Freq: Every day | ORAL | 1 refills | Status: DC

## 2022-08-02 MED ORDER — CLONAZEPAM 0.5 MG PO TABS
0.5000 mg | ORAL_TABLET | Freq: Two times a day (BID) | ORAL | 1 refills | Status: DC | PRN
Start: 2022-08-02 — End: 2022-08-02

## 2022-08-02 MED ORDER — CLONAZEPAM 0.5 MG PO TABS
0.5000 mg | ORAL_TABLET | Freq: Two times a day (BID) | ORAL | 1 refills | Status: DC | PRN
Start: 2022-08-02 — End: 2022-09-28

## 2022-08-02 NOTE — Progress Notes (Signed)
[facility]  Patient ID: Amy Mahoney,  MRN: [id number]  PCP: Donato Schultz, DO  Date of Evaluation: 08/02/2022 Time spent:30 minutes  Chief Complaint:  Chief Complaint   Anxiety; Depression; Follow-up; Medication Refill; Patient Education     HISTORY/CURRENT STATUS: HPI  "Amy Mahoney", 72 year old female presents to this office for follow up and medication management.  No changes this visit. Says that she currently feels stable and content. She doe not want to make any additional medication changes at this time.  She says her anxiety level today is 1 of 10 and her depression is 1 of 10.  She is sleeping 7 to 8 hours per night.  She follows up with her PCP and other specialist regularly as needed.  She denies any history of mania, no psychosis denies any history of auditory or visual hallucinations.  Denies SI or HI.  Past psychotropic medications: Effexor Klonopin      Individual Medical History/ Review of Systems: Changes? :No   Allergies: Patient has no known allergies.  Current Medications:  Current Outpatient Medications:    aspirin EC 81 MG tablet, Take 1 tablet (81 mg total) by mouth daily. Swallow whole., Disp: 30 tablet, Rfl: 12   atorvastatin (LIPITOR) 80 MG tablet, Take 1 tablet (80 mg total) by mouth daily., Disp: 90 tablet, Rfl: 1   B-D UF III MINI PEN NEEDLES 31G X 5 MM MISC, USE AS DIRECTED, Disp: 100 each, Rfl: 3   Calcium Carbonate-Vitamin D (CALCIUM 500 + D PO), Take 1 tablet by mouth 2 (two) times daily., Disp: , Rfl:    clonazePAM (KLONOPIN) 0.5 MG tablet, Take 1 tablet (0.5 mg total) by mouth 2 (two) times daily as needed for anxiety., Disp: 180 tablet, Rfl: 1   fenofibrate 160 MG tablet, TAKE 1 TABLET BY MOUTH DAILY, Disp: 100 tablet, Rfl: 2   levothyroxine (SYNTHROID) 112 MCG tablet, TAKE 1 TABLET BY MOUTH DAILY  BEFORE BREAKFAST, Disp: 100 tablet, Rfl: 2   levothyroxine (SYNTHROID) 125 MCG tablet, Take 1 tablet (125 mcg total) by mouth  daily., Disp: 90 tablet, Rfl: 3   lisinopril (ZESTRIL) 5 MG tablet, TAKE 1 TABLET BY MOUTH DAILY, Disp: 100 tablet, Rfl: 2   Multiple Vitamin (MULTIVITAMIN) tablet, Take 1 tablet by mouth daily., Disp: , Rfl:    omeprazole (PRILOSEC) 40 MG capsule, Take 40 mg by mouth daily., Disp: , Rfl:    ONETOUCH ULTRA test strip, USE AS DIRECTED UP TO 4 TIMES DAILY, Disp: 100 strip, Rfl: 1   tirzepatide (MOUNJARO) 7.5 MG/0.5ML Pen, Inject 7.5 mg into the skin once a week., Disp: 6 mL, Rfl: 3   TRESIBA FLEXTOUCH 100 UNIT/ML FlexTouch Pen, INJECT SUBCUTANEOUSLY 10 UNITS  DAILY, Disp: 15 mL, Rfl: 2   venlafaxine (EFFEXOR) 37.5 MG tablet, Take 1 tablet (37.5 mg total) by mouth daily., Disp: 90 tablet, Rfl: 1   venlafaxine XR (EFFEXOR-XR) 150 MG 24 hr capsule, Take 1 capsule (150 mg total) by mouth daily., Disp: 90 capsule, Rfl: 3   Vitamin D, Ergocalciferol, (DRISDOL) 1.25 MG (50000 UNIT) CAPS capsule, TAKE 1 CAPSULE BY MOUTH EVERY 7  DAYS, Disp: 15 capsule, Rfl: 2 Medication Side Effects: none  Family Medical/ Social History: Changes? No  MENTAL HEALTH EXAM:  There were no vitals taken for this visit.There is no height or weight on file to calculate BMI.  General Appearance: Casual, Neat, and Well Groomed  Eye Contact:  NA  Speech:  Clear and Coherent  Volume:  Normal  Mood:  NA  Affect:  Appropriate  Thought Process:  Coherent  Orientation:  Full (Time, Place, and Person)  Thought Content: Logical   Suicidal Thoughts:  No  Homicidal Thoughts:  No  Memory:  WNL  Judgement:  Good  Insight:  Good  Psychomotor Activity:  Normal  Concentration:  Concentration: Good  Recall:  Good  Fund of Knowledge: Good  Language: Good  Assets:  Desire for Improvement  ADL's:  Intact  Cognition: WNL  Prognosis:  Good    DIAGNOSES:    ICD-10-CM   1. Generalized anxiety disorder  F41.1 venlafaxine (EFFEXOR) 37.5 MG tablet    clonazePAM (KLONOPIN) 0.5 MG tablet    DISCONTINUED: clonazePAM (KLONOPIN) 0.5 MG  tablet    2. Mild episode of recurrent major depressive disorder (HCC)  F33.0     3. Major depressive disorder, recurrent episode, moderate (HCC)  F33.1 venlafaxine (EFFEXOR) 37.5 MG tablet    clonazePAM (KLONOPIN) 0.5 MG tablet    DISCONTINUED: clonazePAM (KLONOPIN) 0.5 MG tablet    4. Depression, major, single episode, moderate (HCC)  F32.1 venlafaxine XR (EFFEXOR-XR) 150 MG 24 hr capsule      Receiving Psychotherapy: No    RECOMMENDATIONS:  Greater than 50% of 20  min face to face time with patient was spent on counseling and coordination of care. No changes this visit.  Discussed her current level of stability. She is very happy with her medications right now and does not want to make any changes at this time.  We agreed to:   To continue Effexor to 187.5 mg daily.  Patient will have to continue 150 mg ER Capsule combined with a 37.5 mg capsule daily. Two separate RX. To continue Klonopin 0.5 mg twice daily for severe anxiety Will report worsening symptoms or side effects promptly Provided emergency contact information Will follow-up in 3 months to reassess Discussed potential benefits, risk, and side effects of benzodiazepines to include potential risk of tolerance and dependence, as well as possible drowsiness.  Advised patient not to drive if experiencing drowsiness and to take lowest possible effective dose to minimize risk of dependence and tolerance.  Reviewed PDMP   Joan Flores, NP              Joan Flores, NP

## 2022-08-15 ENCOUNTER — Other Ambulatory Visit: Payer: Self-pay | Admitting: Family Medicine

## 2022-08-15 DIAGNOSIS — E785 Hyperlipidemia, unspecified: Secondary | ICD-10-CM

## 2022-08-16 ENCOUNTER — Other Ambulatory Visit: Payer: Self-pay | Admitting: Family Medicine

## 2022-08-16 DIAGNOSIS — E559 Vitamin D deficiency, unspecified: Secondary | ICD-10-CM

## 2022-08-20 NOTE — Progress Notes (Signed)
[facility] Summary Report / Loop Recorder 

## 2022-08-27 ENCOUNTER — Ambulatory Visit (INDEPENDENT_AMBULATORY_CARE_PROVIDER_SITE_OTHER): Payer: Medicare Other

## 2022-08-27 DIAGNOSIS — I639 Cerebral infarction, unspecified: Secondary | ICD-10-CM

## 2022-08-27 LAB — CUP PACEART REMOTE DEVICE CHECK
Date Time Interrogation Session: 20240707174512
Implantable Pulse Generator Implant Date: 20240501

## 2022-08-30 ENCOUNTER — Other Ambulatory Visit: Payer: Medicare Other

## 2022-09-10 ENCOUNTER — Other Ambulatory Visit: Payer: Self-pay | Admitting: Family Medicine

## 2022-09-10 DIAGNOSIS — F321 Major depressive disorder, single episode, moderate: Secondary | ICD-10-CM

## 2022-09-13 NOTE — Progress Notes (Signed)
[facility] Summary Report / Loop Recorder 

## 2022-09-24 ENCOUNTER — Telehealth: Payer: Self-pay | Admitting: Family Medicine

## 2022-09-24 NOTE — Telephone Encounter (Signed)
Pt states she has a very large boil with a lot of puss and wanted to speak with the nurse to know if she needs to be prescribed medication or try something over the counter because she doesn't see improvement with what she is using. Please advise pt.

## 2022-09-25 ENCOUNTER — Encounter (HOSPITAL_BASED_OUTPATIENT_CLINIC_OR_DEPARTMENT_OTHER): Payer: Self-pay | Admitting: Emergency Medicine

## 2022-09-25 ENCOUNTER — Other Ambulatory Visit: Payer: Self-pay

## 2022-09-25 ENCOUNTER — Emergency Department (HOSPITAL_BASED_OUTPATIENT_CLINIC_OR_DEPARTMENT_OTHER)
Admission: EM | Admit: 2022-09-25 | Discharge: 2022-09-25 | Disposition: A | Discharge status: Home / Self Care | Payer: Medicare Other | Attending: Emergency Medicine | Admitting: Emergency Medicine

## 2022-09-25 DIAGNOSIS — Z7982 Long term (current) use of aspirin: Secondary | ICD-10-CM | POA: Diagnosis not present

## 2022-09-25 DIAGNOSIS — L02211 Cutaneous abscess of abdominal wall: Secondary | ICD-10-CM | POA: Insufficient documentation

## 2022-09-25 DIAGNOSIS — E119 Type 2 diabetes mellitus without complications: Secondary | ICD-10-CM | POA: Insufficient documentation

## 2022-09-25 DIAGNOSIS — Z794 Long term (current) use of insulin: Secondary | ICD-10-CM | POA: Diagnosis not present

## 2022-09-25 MED ORDER — DOXYCYCLINE HYCLATE 100 MG PO CAPS: 100.0000 mg | ORAL_CAPSULE | Freq: Two times a day (BID) | ORAL | 0 refills | Status: DC

## 2022-09-25 MED ORDER — LIDOCAINE-EPINEPHRINE 1 %-1:100000 IJ SOLN
10.0000 mL | Freq: Once | INTRAMUSCULAR | Status: AC
  Administered 2022-09-25: 10 mL
  Filled 2022-09-25: qty 1

## 2022-09-25 NOTE — ED Provider Notes (Signed)
[place] [facility] AT [facility] Provider Note   CSN: [id number] Arrival date & time: 09/25/22  6644     History  Chief Complaint  Patient presents with   Abscess    Amy Mahoney is a 72 y.o. female, history of diabetes, who presents to the ED secondary to abscess on her abdomen, has been going on for the last 10 days.  She states states it started as a boil, but has grown.  She has been using hydrocolloid bandages, over it to pull the pus out, but now it is festering into a larger abscess.  She also notes that she has another boil along her abdomen, that she is concerned may develop into another abscess.  Denies any fevers, chills.  No recent abdominal surgeries.  No history of Crohn's or ulcerative colitis.     Home Medications Prior to Admission medications   Medication Sig Start Date End Date Taking? Authorizing Provider  doxycycline (VIBRAMYCIN) 100 MG capsule Take 1 capsule (100 mg total) by mouth 2 (two) times daily. 09/25/22  Yes Rocsi Hazelbaker L, PA  aspirin EC 81 MG tablet Take 1 tablet (81 mg total) by mouth daily. Swallow whole. 06/21/22   Kathlen Mody, MD  atorvastatin (LIPITOR) 80 MG tablet Take 1 tablet (80 mg total) by mouth daily. 07/05/22 07/05/23  Donato Schultz, DO  B-D UF III MINI PEN NEEDLES 31G X 5 MM MISC USE AS DIRECTED 09/11/21   Zola Button, Grayling Congress, DO  Calcium Carbonate-Vitamin D (CALCIUM 500 + D PO) Take 1 tablet by mouth 2 (two) times daily.    [provider]  clonazePAM (KLONOPIN) 0.5 MG tablet Take 1 tablet (0.5 mg total) by mouth 2 (two) times daily as needed for anxiety. 08/02/22   Joan Flores, NP  fenofibrate 160 MG tablet TAKE 1 TABLET BY MOUTH DAILY 08/16/22   Zola Button, Grayling Congress, DO  levothyroxine (SYNTHROID) 112 MCG tablet TAKE 1 TABLET BY MOUTH DAILY  BEFORE BREAKFAST 03/16/22   Zola Button, Grayling Congress, DO  levothyroxine (SYNTHROID) 125 MCG tablet Take 1 tablet (125 mcg total) by mouth daily. 07/05/22   Seabron Spates R, DO  lisinopril (ZESTRIL) 5 MG tablet TAKE 1 TABLET BY MOUTH DAILY 07/26/22   Donato Schultz, DO  Multiple Vitamin (MULTIVITAMIN) tablet Take 1 tablet by mouth daily.    [provider]  omeprazole (PRILOSEC) 40 MG capsule Take 40 mg by mouth daily. 05/06/21   [provider]  ONETOUCH ULTRA test strip USE AS DIRECTED UP TO 4 TIMES DAILY 10/04/20   Zola Button, Grayling Congress, DO  tirzepatide [facility]) 7.5 MG/0.5ML Pen Inject 7.5 mg into the skin once a week. 02/02/22   Zola Button, Yvonne R, DO  TRESIBA FLEXTOUCH 100 UNIT/ML FlexTouch Pen INJECT SUBCUTANEOUSLY 10 UNITS  DAILY 03/21/22   Zola Button, Grayling Congress, DO  venlafaxine (EFFEXOR) 37.5 MG tablet Take 1 tablet (37.5 mg total) by mouth daily. 08/02/22   Joan Flores, NP  venlafaxine XR (EFFEXOR-XR) 150 MG 24 hr capsule TAKE 1 CAPSULE BY MOUTH DAILY 09/11/22   Zola Button, Grayling Congress, DO  Vitamin D, Ergocalciferol, (DRISDOL) 1.25 MG (50000 UNIT) CAPS capsule TAKE 1 CAPSULE BY MOUTH EVERY 7  DAYS 08/16/22   Donato Schultz, DO      Allergies    Patient has no known allergies.    Review of Systems   Review of Systems  Constitutional:  Negative  for fever.  Skin:  Positive for wound.    Physical Exam Updated Vital Signs BP 137/60 (BP Location: Right Arm)   Pulse 74   Temp 98.2 F (36.8 C) (Oral)   Resp 16   Ht 5\' 4"  (1.626 m)   Wt 107.2 kg   SpO2 100%   BMI 40.57 kg/m  Physical Exam Vitals and nursing note reviewed.  Constitutional:      General: She is not in acute distress.    Appearance: She is well-developed.  HENT:     Head: Normocephalic and atraumatic.  Eyes:     General:        Right eye: No discharge.        Left eye: No discharge.     Conjunctiva/sclera: Conjunctivae normal.  Pulmonary:     Effort: No respiratory distress.  Skin:    Comments: See picture for further detail.  2 and half centimeter, by 2 cm fluctuant mass, with multiple loculations, and purulent drainage, with  tenderness to palpation along mid/right abdomen.  Additionally half centimeter boil noted inferior to the area.  Neurological:     Mental Status: She is alert.     Comments: Clear speech.   Psychiatric:        Behavior: Behavior normal.        Thought Content: Thought content normal.     ED Results / Procedures / Treatments   Labs (all labs ordered are listed, but only abnormal results are displayed) Labs Reviewed - No data to display  EKG None  Radiology No results found.  Procedures .Marland KitchenIncision and Drainage  Date/Time: 09/25/2022 10:22 AM  Performed by: Pete Pelt, PA Authorized by: Pete Pelt, PA   Consent:    Consent obtained:  Verbal   Consent given by:  Patient   Risks discussed:  Damage to other organs, bleeding, incomplete drainage, infection and pain   Alternatives discussed:  Alternative treatment Universal protocol:    Patient identity confirmed:  Verbally with patient Location:    Type:  Abscess   Size:  2.5cmx2cm   Location:  Trunk   Trunk location:  Abdomen Pre-procedure details:    Skin preparation:  Povidone-iodine Sedation:    Sedation type:  None Anesthesia:    Anesthesia method:  Local infiltration   Local anesthetic:  Lidocaine 1% WITH epi Procedure type:    Complexity:  Simple Procedure details:    Incision types:  Stab incision   Wound management:  Probed and deloculated   Drainage:  Bloody and purulent   Drainage amount:  Moderate   Wound treatment:  Wound left open   Packing materials:  None Post-procedure details:    Procedure completion:  Tolerated     Medications Ordered in ED Medications  lidocaine-EPINEPHrine (XYLOCAINE W/EPI) 1 %-1:100000 (with pres) injection 10 mL (10 mLs Infiltration Given by Other 09/25/22 1002)    ED Course/ Medical Decision Making/ A&P                                 Medical Decision Making Patient is 72 year old female, with an abscess, to her right abdomen, for the 10 days.  She has been  using hydrocolloid bandages, for it, and having good purulent drainage.  She has multiple loculations, the abscess, utilizing ultrasound, to make sure that it did not go further into the abdomen, which is fairly superficial, however given the multiple loculations, and incision and  drainage was performed, with moderate drainage.  I placed her on doxycycline, for antibiotics, to help facilitate wound healing.  As well as to help with the boil.  We discussed return precautions and she voiced understanding.  I did use a 23-gauge needle, to poke a hole in the boil, and expressed some pus out of it.  Band-Aid, wound care was applied to both.  We discussed return precautions and she voiced understanding.  Risk Prescription drug management.    Final Clinical Impression(s) / ED Diagnoses Final diagnoses:  Abscess of skin of abdomen    Rx / DC Orders ED Discharge Orders          Ordered    doxycycline (VIBRAMYCIN) 100 MG capsule  2 times daily        09/25/22 1020              Ayliana Casciano, [place], [place] 09/25/22 1024    Terrilee Files, MD 09/25/22 1731

## 2022-09-25 NOTE — ED Triage Notes (Signed)
Rt lower abcesss x 10 days states has been tx it her self with bandage changes , silver dollar size  sheb states

## 2022-09-25 NOTE — Discharge Instructions (Signed)
Follow-up with your primary care doctor, you should see improvement within the next 24 to 48 hours with antibiotics.  The redness, swelling and an area of inflammation will improve.  Keep the area clean and dry, and make sure you are using gauze to come or cover it, to help with promotion of the drainage.  Return to the ER if you have worsening redness, swelling, warmth, or fevers.

## 2022-09-25 NOTE — ED Notes (Signed)

## 2022-09-25 NOTE — ED Notes (Signed)
ED Provider at bedside. 

## 2022-09-27 ENCOUNTER — Other Ambulatory Visit: Payer: Self-pay | Admitting: Behavioral Health

## 2022-09-27 ENCOUNTER — Other Ambulatory Visit: Payer: Self-pay | Admitting: Family Medicine

## 2022-09-27 DIAGNOSIS — F411 Generalized anxiety disorder: Secondary | ICD-10-CM

## 2022-09-27 DIAGNOSIS — F331 Major depressive disorder, recurrent, moderate: Secondary | ICD-10-CM

## 2022-10-01 ENCOUNTER — Ambulatory Visit (INDEPENDENT_AMBULATORY_CARE_PROVIDER_SITE_OTHER): Payer: Medicare Other

## 2022-10-01 DIAGNOSIS — I639 Cerebral infarction, unspecified: Secondary | ICD-10-CM

## 2022-10-08 DIAGNOSIS — G4733 Obstructive sleep apnea (adult) (pediatric): Secondary | ICD-10-CM | POA: Diagnosis not present

## 2022-10-09 ENCOUNTER — Telehealth: Payer: Self-pay | Admitting: Family Medicine

## 2022-10-09 NOTE — Telephone Encounter (Signed)
Amy Mahoney from [facility] order pharmacy called & requested to receive an approval from Dr. Laury Axon for the pharmacy to be able to change the manufacture of the medication levothyroxine (SYNTHROID) 125 MCG tablet. Please call & advise.

## 2022-10-10 NOTE — Telephone Encounter (Signed)
Verbal given 

## 2022-10-10 NOTE — Telephone Encounter (Signed)
Rep from [facility] called back to follow up on status and provided to following info for callback:  P: [phone] Ref: [id number]

## 2022-10-15 ENCOUNTER — Telehealth: Payer: Medicare Other | Admitting: Family Medicine

## 2022-10-15 ENCOUNTER — Encounter: Payer: Self-pay | Admitting: Family Medicine

## 2022-10-15 DIAGNOSIS — L02211 Cutaneous abscess of abdominal wall: Secondary | ICD-10-CM | POA: Diagnosis not present

## 2022-10-15 MED ORDER — DOXYCYCLINE HYCLATE 100 MG PO TABS
100.0000 mg | ORAL_TABLET | Freq: Two times a day (BID) | ORAL | 0 refills | Status: DC
Start: 2022-10-15 — End: 2022-11-15

## 2022-10-15 NOTE — Progress Notes (Signed)
MyChart Video Visit    Virtual Visit via Video Note   This patient is at least at moderate risk for complications without adequate follow up. This format is felt to be most appropriate for this patient at this time. Physical exam was limited by quality of the video and audio technology used for the visit. [name] was able to get the patient set up on a video visit.  Patient location: HOME  Patient and provider in visit Provider location: Office  I discussed the limitations of evaluation and management by telemedicine and the availability of in person appointments. The patient expressed understanding and agreed to proceed.  Visit Date: 10/15/2022  Today's healthcare provider: Donato Schultz, DO     Subjective:    Patient ID: Amy Mahoney, female    DOB: 1951/01/09, 72 y.o.   MRN: [id number]  No chief complaint on file.   HPI Patient is in today for F/U ABSCESS.  Discussed the use of AI scribe software for clinical note transcription with the patient, who gave verbal consent to proceed.  History of Present Illness   The patient presents for follow-up of a large boil that was previously treated in the emergency room. The boil, initially three times its current size, was drained and the patient was prescribed a 10-day course of doxycycline. The patient reports that the boil is still seeping and is slightly sore, but has significantly decreased in size since starting the antibiotic. She has completed the course of doxycycline but feels that she needs additional treatment to completely resolve the boil.       Past Medical History:  Diagnosis Date   Abnormal esophagram    Abscess of skin 04/15/2015   Anxiety    Back pain    Bacterial vaginal infection 12/17/2014   Bronchitis with bronchospasm 04/14/2013   Chest heaviness 07/11/2020   Class 3 severe obesity with serious comorbidity and body mass index (BMI) of 40.0 to 44.9 in adult [facility]) 06/10/2018   Constipation    Cough  09/17/2013   DEPRESSION 09/05/2006   Qualifier: Diagnosis of  By: Janit Bern     Depression with anxiety 07/11/2020   Diabetes mellitus without complication (HCC)    DIZZINESS, CHRONIC 05/23/2006   Qualifier: Diagnosis of  By: Janit Bern     Dysphagia    FATIGUE 06/01/2009   Qualifier: Diagnosis of  By: Janit Bern     Gallbladder problem    Gastroesophageal reflux disease 05/24/2017   Heart murmur    Hyperlipidemia    Hyperlipidemia associated with type 2 diabetes mellitus (HCC) 07/11/2020   Hyperlipidemia LDL goal <100 11/06/2012   Hypothyroidism 05/23/2006   Qualifier: Diagnosis of  By: Janit Bern     Lichen planus 12/01/2020   MORBID OBESITY 03/30/2008   Qualifier: Diagnosis of  By: Janit Bern     Muscle spasm of back 11/19/2014   Obstructive sleep apnea 05/23/2006   NPSG 08/03/94 RDI/AHI 49/hr, weight was 285 pounds CPAP/ Advanced    Paronychia of index finger 08/08/2021   Pharyngitis 03/03/2017   Post-nasal drainage 08/03/2019   Last Assessment & Plan:  Formatting of this note might be different from the original. Concern over postnasal drainage. Chronic at least a year history of the above but over the last month seems to have worsened.  She has tried antihistamine therapy without improvement.  Denies obvious heartburn.  Some associated cough and occasional near emesis episodes.  She is  not on an ACE inhibitor. EXAM show   Prediabetes    Preventative health care 01/10/2021   Primary hypertension 07/11/2020   Spider bite    Subungual hematoma of fifth toe of right foot 10/29/2017   Swelling    Type 2 diabetes mellitus with hyperglycemia (HCC) 06/10/2018    Past Surgical History:  Procedure Laterality Date   CHOLECYSTECTOMY     DILATION AND CURETTAGE OF UTERUS     ESOPHAGOGASTRODUODENOSCOPY (EGD) WITH PROPOFOL N/A 09/05/2017   Procedure: ESOPHAGOGASTRODUODENOSCOPY (EGD) WITH PROPOFOL;  Surgeon: Beverley Fiedler, MD;  Location: [facility] ENDOS[facility];  Service: Gastroenterology;   Laterality: N/A;   fibroids scrapping     LOOP RECORDER INSERTION N/A 06/20/2022   Procedure: LOOP RECORDER INSERTION;  Surgeon: Regan Lemming, MD;  Location: [facility] INVASIVE CV LAB;  Service: Cardiovascular;  Laterality: N/A;   TONSILLECTOMY      Family History  Problem Relation Age of Onset   Diabetes Mother    Thyroid disease Mother    Depression Mother    Anxiety disorder Brother    Diabetes Maternal Aunt    Lung cancer Maternal Aunt        Nonsmoker   Cancer Maternal Aunt        breast   Breast cancer Maternal Aunt    Cancer Maternal Aunt        lung(nonsmoker)   Breast cancer Paternal Grandmother    Depression Other    Hyperlipidemia Other    Arthritis Other    Colon cancer Neg Hx    Pancreatic cancer Neg Hx    Stomach cancer Neg Hx    Esophageal cancer Neg Hx    Rectal cancer Neg Hx    Stroke Neg Hx     Social History   Socioeconomic History   Marital status: Divorced    Spouse name: Not on file   Number of children: 0   Years of education: Not on file   Highest education level: Master's degree (e.g., MA, MS, MEng, MEd, MSW, MBA)  Occupational History   Occupation: Eps Midwife: [employer]    Employer: [employer]   Occupation: RETIRED  Tobacco Use   Smoking status: Former    Current packs/day: 0.00    Average packs/day: 0.5 packs/day for 3.0 years (1.5 ttl pk-yrs)    Types: Cigarettes    Start date: 08/10/1987    Quit date: 08/10/1990    Years since quitting: 32.2   Smokeless tobacco: Never  Vaping Use   Vaping status: Never Used  Substance and Sexual Activity   Alcohol use: Yes    Comment: rarely   Drug use: No   Sexual activity: Not Currently    Partners: Male  Other Topics Concern   Not on file  Social History Narrative   Regular exercise - yes-- 10,000 steps   Caffeine use: 2 cups of coffee daily   Live  in [place] with 72 year old father to assist in care. Will be moving in with brother and his wife into  larger home to facilitate better care.    Social Determinants of Health   Financial Resource Strain: Low Risk  (05/27/2022)   Overall Financial Resource Strain (CARDIA)    Difficulty of Paying Living Expenses: Not hard at all  Food Insecurity: No Food Insecurity (06/19/2022)   Hunger Vital Sign    Worried About Running Out of Food in the Last Year: Never true    Ran Out of Food  in the Last Year: Never true  Transportation Needs: No Transportation Needs (06/19/2022)   PRAPARE - Administrator, Civil Service (Medical): No    Lack of Transportation (Non-Medical): No  Physical Activity: Insufficiently Active (05/27/2022)   Exercise Vital Sign    Days of Exercise per Week: 3 days    Minutes of Exercise per Session: 20 min  Stress: No Stress Concern Present (05/27/2022)   [employer] of Occupational Health - Occupational Stress Questionnaire    Feeling of Stress : Only a little  Social Connections: Moderately Integrated (05/27/2022)   Social Connection and Isolation Panel [NHANES]    Frequency of Communication with Friends and Family: More than three times a week    Frequency of Social Gatherings with Friends and Family: More than three times a week    Attends Religious Services: More than 4 times per year    Active Member of [employer] or Organizations: Yes    Attends Engineer, structural: More than 4 times per year    Marital Status: Divorced  Intimate Partner Violence: Not At Risk (06/19/2022)   Humiliation, Afraid, Rape, and Kick questionnaire    Fear of Current or Ex-Partner: No    Emotionally Abused: No    Physically Abused: No    Sexually Abused: No    Outpatient Medications Prior to Visit  Medication Sig Dispense Refill   aspirin EC 81 MG tablet Take 1 tablet (81 mg total) by mouth daily. Swallow whole. 30 tablet 12   atorvastatin (LIPITOR) 80 MG tablet Take 1 tablet (80 mg total) by mouth daily. 90 tablet 1   B-D UF III MINI PEN NEEDLES 31G X 5 MM MISC USE AS  DIRECTED 100 each 3   Calcium Carbonate-Vitamin D (CALCIUM 500 + D PO) Take 1 tablet by mouth 2 (two) times daily.     clonazePAM (KLONOPIN) 0.5 MG tablet TAKE 1 TABLET BY MOUTH TWICE A DAY AS NEEDED FOR ANXIETY 60 tablet 0   doxycycline (VIBRAMYCIN) 100 MG capsule Take 1 capsule (100 mg total) by mouth 2 (two) times daily. 20 capsule 0   fenofibrate 160 MG tablet TAKE 1 TABLET BY MOUTH DAILY 100 tablet 2   levothyroxine (SYNTHROID) 125 MCG tablet Take 1 tablet (125 mcg total) by mouth daily. 90 tablet 3   lisinopril (ZESTRIL) 5 MG tablet TAKE 1 TABLET BY MOUTH DAILY 100 tablet 2   Multiple Vitamin (MULTIVITAMIN) tablet Take 1 tablet by mouth daily.     omeprazole (PRILOSEC) 40 MG capsule Take 40 mg by mouth daily.     ONETOUCH ULTRA test strip USE AS DIRECTED UP TO 4 TIMES DAILY 100 strip 1   tirzepatide (MOUNJARO) 7.5 MG/0.5ML Pen Inject 7.5 mg into the skin once a week. 6 mL 3   TRESIBA FLEXTOUCH 100 UNIT/ML FlexTouch Pen INJECT SUBCUTANEOUSLY 10 UNITS  DAILY 15 mL 2   venlafaxine (EFFEXOR) 37.5 MG tablet Take 1 tablet (37.5 mg total) by mouth daily. 90 tablet 1   venlafaxine XR (EFFEXOR-XR) 150 MG 24 hr capsule TAKE 1 CAPSULE BY MOUTH DAILY 90 capsule 1   Vitamin D, Ergocalciferol, (DRISDOL) 1.25 MG (50000 UNIT) CAPS capsule TAKE 1 CAPSULE BY MOUTH EVERY 7  DAYS 15 capsule 2   levothyroxine (SYNTHROID) 112 MCG tablet TAKE 1 TABLET BY MOUTH DAILY  BEFORE BREAKFAST 100 tablet 2   tirzepatide (MOUNJARO) 5 MG/0.5ML Pen INJECT 5 MG SUBCUTANEOUSLY WEEKLY 6 mL 2   No facility-administered medications prior to visit.  No Known Allergies  Review of Systems  Constitutional:  Negative for chills, fever and malaise/fatigue.  HENT:  Negative for congestion and hearing loss.   Eyes:  Negative for blurred vision and discharge.  Respiratory:  Negative for cough, sputum production and shortness of breath.   Cardiovascular:  Negative for chest pain, palpitations and leg swelling.  Gastrointestinal:   Negative for abdominal pain, blood in stool, constipation, diarrhea, heartburn, nausea and vomiting.  Genitourinary:  Negative for dysuria, frequency, hematuria and urgency.  Musculoskeletal:  Negative for back pain, falls and myalgias.  Skin:  Negative for rash.  Neurological:  Negative for dizziness, sensory change, loss of consciousness, weakness and headaches.  Endo/Heme/Allergies:  Negative for environmental allergies. Does not bruise/bleed easily.  Psychiatric/Behavioral:  Negative for depression and suicidal ideas. The patient is not nervous/anxious and does not have insomnia.        Objective:    Physical Exam Vitals and nursing note reviewed.  Constitutional:      General: She is not in acute distress.    Appearance: Normal appearance. She is well-developed.  HENT:     Head: Normocephalic and atraumatic.  Eyes:     General: No scleral icterus.       Right eye: No discharge.        Left eye: No discharge.  Musculoskeletal:        General: Normal range of motion.     Right lower leg: No edema.     Left lower leg: No edema.  Skin:    General: Skin is warm and dry.     Findings: Lesion present.          Comments: ABSCESS--- LOWER R SIDE ABD. , IIMPROVING  STILL TENDER  Neurological:     Mental Status: She is alert and oriented to person, place, and time.  Psychiatric:        Mood and Affect: Mood normal.        Behavior: Behavior normal.        Thought Content: Thought content normal.        Judgment: Judgment normal.     There were no vitals taken for this visit. Wt Readings from Last 3 Encounters:  09/25/22 236 lb 5.3 oz (107.2 kg)  07/19/22 236 lb 6.4 oz (107.2 kg)  07/05/22 235 lb 9.6 oz (106.9 kg)       Assessment & Plan:  Cutaneous abscess of abdominal wall -     Doxycycline Hyclate; Take 1 tablet (100 mg total) by mouth 2 (two) times daily.  Dispense: 20 tablet; Refill: 0   Assessment and Plan    Skin Abscess Improved but not resolved after 10  days of Doxycycline 100mg  and incision and drainage. Still tender and draining. -Continue wound care. -Prescribe additional course of Doxycycline 100mg . -If not improving, patient to call office.        I discussed the assessment and treatment plan with the patient. The patient was provided an opportunity to ask questions and all were answered. The patient agreed with the plan and demonstrated an understanding of the instructions.   The patient was advised to call back or seek an in-person evaluation if the symptoms worsen or if the condition fails to improve as anticipated.  Donato Schultz, DO [facility] [phone] (phone) [phone] (fax)  [facility]

## 2022-10-16 NOTE — Progress Notes (Signed)
[facility] Summary Report / Loop Recorder 

## 2022-10-23 ENCOUNTER — Encounter: Payer: Self-pay | Admitting: Family Medicine

## 2022-10-23 DIAGNOSIS — H52203 Unspecified astigmatism, bilateral: Secondary | ICD-10-CM | POA: Diagnosis not present

## 2022-10-23 DIAGNOSIS — E119 Type 2 diabetes mellitus without complications: Secondary | ICD-10-CM | POA: Diagnosis not present

## 2022-10-23 DIAGNOSIS — H2513 Age-related nuclear cataract, bilateral: Secondary | ICD-10-CM | POA: Diagnosis not present

## 2022-10-23 DIAGNOSIS — H31003 Unspecified chorioretinal scars, bilateral: Secondary | ICD-10-CM | POA: Diagnosis not present

## 2022-10-23 DIAGNOSIS — H5203 Hypermetropia, bilateral: Secondary | ICD-10-CM | POA: Diagnosis not present

## 2022-10-23 LAB — HM DIABETES EYE EXAM

## 2022-10-26 ENCOUNTER — Other Ambulatory Visit: Payer: Self-pay | Admitting: Family Medicine

## 2022-10-26 DIAGNOSIS — E1169 Type 2 diabetes mellitus with other specified complication: Secondary | ICD-10-CM

## 2022-11-02 ENCOUNTER — Encounter: Payer: Self-pay | Admitting: Behavioral Health

## 2022-11-02 ENCOUNTER — Telehealth: Payer: Medicare Other | Admitting: Behavioral Health

## 2022-11-02 DIAGNOSIS — F411 Generalized anxiety disorder: Secondary | ICD-10-CM | POA: Diagnosis not present

## 2022-11-02 DIAGNOSIS — F321 Major depressive disorder, single episode, moderate: Secondary | ICD-10-CM

## 2022-11-02 DIAGNOSIS — F331 Major depressive disorder, recurrent, moderate: Secondary | ICD-10-CM

## 2022-11-02 MED ORDER — CLONAZEPAM 0.5 MG PO TABS: 0.5000 mg | ORAL_TABLET | Freq: Every day | ORAL | 1 refills | Status: DC

## 2022-11-02 MED ORDER — VENLAFAXINE HCL 37.5 MG PO TABS
37.5000 mg | ORAL_TABLET | Freq: Every day | ORAL | 1 refills | Status: DC
Start: 2022-11-02 — End: 2023-01-15

## 2022-11-02 MED ORDER — VENLAFAXINE HCL ER 150 MG PO CP24
150.0000 mg | ORAL_CAPSULE | Freq: Every day | ORAL | 1 refills | Status: DC
Start: 2022-11-02 — End: 2023-01-15

## 2022-11-02 NOTE — Progress Notes (Signed)
Amy Mahoney [id number] 1950-07-23 72 y.o.  Virtual Visit via Video Note  I connected with pt @ on 11/02/22 at  8:30 AM EDT by a video enabled telemedicine application and verified that I am speaking with the correct person using two identifiers.   I discussed the limitations of evaluation and management by telemedicine and the availability of in person appointments. The patient expressed understanding and agreed to proceed.  I discussed the assessment and treatment plan with the patient. The patient was provided an opportunity to ask questions and all were answered. The patient agreed with the plan and demonstrated an understanding of the instructions.   The patient was advised to call back or seek an in-person evaluation if the symptoms worsen or if the condition fails to improve as anticipated.  I provided 30 minutes of non-face-to-face time during this encounter.  The patient was located at home.  The provider was located at [facility].   Joan Flores, NP   Subjective:   Patient ID:  Amy Mahoney is a 72 y.o. (DOB 1950-08-09) female.  Chief Complaint: No chief complaint on file.   HPI "Amy Mahoney", 72 year old female presents to this office for follow up and medication management.  No changes this visit. Says that she currently feels stable and content. She doe not want to make any additional medication changes at this time. Her Dad now has hospice care and her family is trying to adjust to the changes and new stressors on the family.  She says her anxiety level today is 3 of 10 and her depression is 3 of 10.  She is sleeping 7 to 8 hours per night.  She follows up with her PCP and other specialist regularly as needed.  She denies any history of mania, no psychosis denies any history of auditory or visual hallucinations.  Denies SI or HI.  Past psychotropic medications: Effexor Klonopin    Review of Systems:  Review of Systems  Constitutional: Negative.    Allergic/Immunologic: Negative.   Psychiatric/Behavioral:  Positive for dysphoric mood.     Medications: I have reviewed the patient's current medications.  Current Outpatient Medications  Medication Sig Dispense Refill   aspirin EC 81 MG tablet Take 1 tablet (81 mg total) by mouth daily. Swallow whole. 30 tablet 12   atorvastatin (LIPITOR) 80 MG tablet TAKE 1 TABLET BY MOUTH DAILY 100 tablet 2   B-D UF III MINI PEN NEEDLES 31G X 5 MM MISC USE AS DIRECTED 100 each 3   Calcium Carbonate-Vitamin D (CALCIUM 500 + D PO) Take 1 tablet by mouth 2 (two) times daily.     clonazePAM (KLONOPIN) 0.5 MG tablet Take 1 tablet (0.5 mg total) by mouth daily. 90 tablet 1   doxycycline (VIBRA-TABS) 100 MG tablet Take 1 tablet (100 mg total) by mouth 2 (two) times daily. 20 tablet 0   doxycycline (VIBRAMYCIN) 100 MG capsule Take 1 capsule (100 mg total) by mouth 2 (two) times daily. 20 capsule 0   fenofibrate 160 MG tablet TAKE 1 TABLET BY MOUTH DAILY 100 tablet 2   levothyroxine (SYNTHROID) 125 MCG tablet Take 1 tablet (125 mcg total) by mouth daily. 90 tablet 3   lisinopril (ZESTRIL) 5 MG tablet TAKE 1 TABLET BY MOUTH DAILY 100 tablet 2   Multiple Vitamin (MULTIVITAMIN) tablet Take 1 tablet by mouth daily.     omeprazole (PRILOSEC) 40 MG capsule Take 40 mg by mouth daily.     ONETOUCH ULTRA test strip USE  AS DIRECTED UP TO 4 TIMES DAILY 100 strip 1   tirzepatide (MOUNJARO) 7.5 MG/0.5ML Pen Inject 7.5 mg into the skin once a week. 6 mL 3   TRESIBA FLEXTOUCH 100 UNIT/ML FlexTouch Pen INJECT SUBCUTANEOUSLY 10 UNITS  DAILY 15 mL 2   venlafaxine (EFFEXOR) 37.5 MG tablet Take 1 tablet (37.5 mg total) by mouth daily. 90 tablet 1   venlafaxine XR (EFFEXOR-XR) 150 MG 24 hr capsule Take 1 capsule (150 mg total) by mouth daily. 90 capsule 1   Vitamin D, Ergocalciferol, (DRISDOL) 1.25 MG (50000 UNIT) CAPS capsule TAKE 1 CAPSULE BY MOUTH EVERY 7  DAYS 15 capsule 2   No current facility-administered medications for  this visit.    Medication Side Effects: None  Allergies: No Known Allergies  Past Medical History:  Diagnosis Date   Abnormal esophagram    Abscess of skin 04/15/2015   Anxiety    Back pain    Bacterial vaginal infection 12/17/2014   Bronchitis with bronchospasm 04/14/2013   Chest heaviness 07/11/2020   Class 3 severe obesity with serious comorbidity and body mass index (BMI) of 40.0 to 44.9 in adult [facility]) 06/10/2018   Constipation    Cough 09/17/2013   DEPRESSION 09/05/2006   Qualifier: Diagnosis of  By: Janit Bern     Depression with anxiety 07/11/2020   Diabetes mellitus without complication (HCC)    DIZZINESS, CHRONIC 05/23/2006   Qualifier: Diagnosis of  By: Janit Bern     Dysphagia    FATIGUE 06/01/2009   Qualifier: Diagnosis of  By: Janit Bern     Gallbladder problem    Gastroesophageal reflux disease 05/24/2017   Heart murmur    Hyperlipidemia    Hyperlipidemia associated with type 2 diabetes mellitus (HCC) 07/11/2020   Hyperlipidemia LDL goal <100 11/06/2012   Hypothyroidism 05/23/2006   Qualifier: Diagnosis of  By: Janit Bern     Lichen planus 12/01/2020   MORBID OBESITY 03/30/2008   Qualifier: Diagnosis of  By: Janit Bern     Muscle spasm of back 11/19/2014   Obstructive sleep apnea 05/23/2006   NPSG 08/03/94 RDI/AHI 49/hr, weight was 285 pounds CPAP/ Advanced    Paronychia of index finger 08/08/2021   Pharyngitis 03/03/2017   Post-nasal drainage 08/03/2019   Last Assessment & Plan:  Formatting of this note might be different from the original. Concern over postnasal drainage. Chronic at least a year history of the above but over the last month seems to have worsened.  She has tried antihistamine therapy without improvement.  Denies obvious heartburn.  Some associated cough and occasional near emesis episodes.  She is not on an ACE inhibitor. EXAM show   Prediabetes    Preventative health care 01/10/2021   Primary hypertension 07/11/2020   Spider bite     Subungual hematoma of fifth toe of right foot 10/29/2017   Swelling    Type 2 diabetes mellitus with hyperglycemia (HCC) 06/10/2018    Family History  Problem Relation Age of Onset   Diabetes Mother    Thyroid disease Mother    Depression Mother    Anxiety disorder Brother    Diabetes Maternal Aunt    Lung cancer Maternal Aunt        Nonsmoker   Cancer Maternal Aunt        breast   Breast cancer Maternal Aunt    Cancer Maternal Aunt        lung(nonsmoker)   Breast cancer Paternal Grandmother  Depression Other    Hyperlipidemia Other    Arthritis Other    Colon cancer Neg Hx    Pancreatic cancer Neg Hx    Stomach cancer Neg Hx    Esophageal cancer Neg Hx    Rectal cancer Neg Hx    Stroke Neg Hx     Social History   Socioeconomic History   Marital status: Divorced    Spouse name: Not on file   Number of children: 0   Years of education: Not on file   Highest education level: Master's degree (e.g., MA, MS, MEng, MEd, MSW, MBA)  Occupational History   Occupation: Eps Midwife: [employer]    Employer: [employer]   Occupation: RETIRED  Tobacco Use   Smoking status: Former    Current packs/day: 0.00    Average packs/day: 0.5 packs/day for 3.0 years (1.5 ttl pk-yrs)    Types: Cigarettes    Start date: 08/10/1987    Quit date: 08/10/1990    Years since quitting: 32.2   Smokeless tobacco: Never  Vaping Use   Vaping status: Never Used  Substance and Sexual Activity   Alcohol use: Yes    Comment: rarely   Drug use: No   Sexual activity: Not Currently    Partners: Male  Other Topics Concern   Not on file  Social History Narrative   Regular exercise - yes-- 10,000 steps   Caffeine use: 2 cups of coffee daily   Live  in [place] with 54 year old father to assist in care. Will be moving in with brother and his wife into larger home to facilitate better care.    Social Determinants of Health   Financial Resource Strain: Low Risk   (05/27/2022)   Overall Financial Resource Strain (CARDIA)    Difficulty of Paying Living Expenses: Not hard at all  Food Insecurity: No Food Insecurity (06/19/2022)   Hunger Vital Sign    Worried About Running Out of Food in the Last Year: Never true    Ran Out of Food in the Last Year: Never true  Transportation Needs: No Transportation Needs (06/19/2022)   PRAPARE - Administrator, Civil Service (Medical): No    Lack of Transportation (Non-Medical): No  Physical Activity: Insufficiently Active (05/27/2022)   Exercise Vital Sign    Days of Exercise per Week: 3 days    Minutes of Exercise per Session: 20 min  Stress: No Stress Concern Present (05/27/2022)   [employer] of Occupational Health - Occupational Stress Questionnaire    Feeling of Stress : Only a little  Social Connections: Moderately Integrated (05/27/2022)   Social Connection and Isolation Panel [NHANES]    Frequency of Communication with Friends and Family: More than three times a week    Frequency of Social Gatherings with Friends and Family: More than three times a week    Attends Religious Services: More than 4 times per year    Active Member of [employer] or Organizations: Yes    Attends Banker Meetings: More than 4 times per year    Marital Status: Divorced  Intimate Partner Violence: Not At Risk (06/19/2022)   Humiliation, Afraid, Rape, and Kick questionnaire    Fear of Current or Ex-Partner: No    Emotionally Abused: No    Physically Abused: No    Sexually Abused: No    Past Medical History, Surgical history, Social history, and Family history were reviewed and updated as appropriate.  Please see review of systems for further details on the patient's review from today.   Objective:   Physical Exam:  There were no vitals taken for this visit.  Physical Exam Neurological:     Mental Status: She is alert and oriented to person, place, and time.  Psychiatric:        Attention and  Perception: Attention and perception normal.        Mood and Affect: Mood normal.        Speech: Speech normal.        Behavior: Behavior normal. Behavior is cooperative.        Cognition and Memory: Cognition and memory normal.        Judgment: Judgment normal.     Comments: Insight intact     Lab Review:     Component Value Date/Time   NA 137 06/20/2022 0717   NA 142 10/06/2018 1039   K 3.6 06/20/2022 0717   CL 108 06/20/2022 0717   CO2 23 06/20/2022 0717   GLUCOSE 142 (H) 06/20/2022 0717   BUN 16 06/20/2022 0717   BUN 26 10/06/2018 1039   CREATININE 0.74 06/20/2022 0717   CALCIUM 9.3 06/20/2022 0717   PROT 7.1 06/19/2022 1021   PROT 6.7 10/06/2018 1039   ALBUMIN 3.9 06/19/2022 1021   ALBUMIN 4.3 10/06/2018 1039   AST 44 (H) 06/19/2022 1021   ALT 28 06/19/2022 1021   ALKPHOS 49 06/19/2022 1021   BILITOT 0.5 06/19/2022 1021   BILITOT 0.3 10/06/2018 1039   GFRNONAA >60 06/20/2022 0717   GFRAA 78 10/06/2018 1039       Component Value Date/Time   WBC 5.4 06/20/2022 0717   RBC 4.41 06/20/2022 0717   HGB 13.3 06/20/2022 0717   HGB 12.5 01/01/2018 1136   HCT 41.0 06/20/2022 0717   HCT 38.0 01/01/2018 1136   PLT 276 06/20/2022 0717   PLT 305 01/01/2018 1136   MCV 93.0 06/20/2022 0717   MCV 90 01/01/2018 1136   MCH 30.2 06/20/2022 0717   MCHC 32.4 06/20/2022 0717   RDW 14.3 06/20/2022 0717   RDW 12.5 01/01/2018 1136   LYMPHSABS 1.8 06/19/2022 1021   LYMPHSABS 2.3 01/01/2018 1136   MONOABS 0.7 06/19/2022 1021   EOSABS 0.4 06/19/2022 1021   EOSABS 0.4 01/01/2018 1136   BASOSABS 0.1 06/19/2022 1021   BASOSABS 0.1 01/01/2018 1136    No results found for: "POCLITH", "LITHIUM"   No results found for: "PHENYTOIN", "PHENOBARB", "VALPROATE", "CBMZ"   .res Assessment: Plan:    Greater than 50% of 30  min face to face time with patient was spent on counseling and coordination of care. No changes this visit.  Discussed her current level of stability. She is very happy  with her medications right now and does not want to make any changes at this time. Discussed health coping mechanisms. [facility] taking water aerobics.  We agreed to:   To continue Effexor to 187.5 mg daily.  Patient will have to continue 150 mg ER Capsule combined with a 37.5 mg capsule daily. Two separate RX. To continue Klonopin 0.5 mg twice daily for severe anxiety Will report worsening symptoms or side effects promptly Provided emergency contact information Will follow-up in 3 months to reassess Discussed potential benefits, risk, and side effects of benzodiazepines to include potential risk of tolerance and dependence, as well as possible drowsiness.  Advised patient not to drive if experiencing drowsiness and  to take lowest possible effective dose to minimize risk of dependence and tolerance.  Reviewed PDMP   Joan Flores, NP           Diagnoses and all orders for this visit:  Generalized anxiety disorder -     clonazePAM (KLONOPIN) 0.5 MG tablet; Take 1 tablet (0.5 mg total) by mouth daily. -     venlafaxine (EFFEXOR) 37.5 MG tablet; Take 1 tablet (37.5 mg total) by mouth daily.  Major depressive disorder, recurrent episode, moderate (HCC) -     clonazePAM (KLONOPIN) 0.5 MG tablet; Take 1 tablet (0.5 mg total) by mouth daily. -     venlafaxine (EFFEXOR) 37.5 MG tablet; Take 1 tablet (37.5 mg total) by mouth daily.  Depression, major, single episode, moderate (HCC) -     venlafaxine XR (EFFEXOR-XR) 150 MG 24 hr capsule; Take 1 capsule (150 mg total) by mouth daily.     Please see After Visit Summary for patient specific instructions.  Future Appointments  Date Time Provider Department Center  11/05/2022  7:20 AM [facility] DEVICE REMOTES [facility]  12/10/2022  7:00 AM [facility] DEVICE REMOTES [facility]  12/10/2022  3:30 PM Young, Joni Fears D, MD [facility] None  01/14/2023  7:00 AM [facility] DEVICE REMOTES  [facility]  02/18/2023  7:00 AM [facility] DEVICE REMOTES [facility]  03/25/2023  7:00 AM [facility] DEVICE REMOTES [facility]  04/29/2023  7:00 AM [facility] DEVICE REMOTES [facility]  06/03/2023  7:00 AM [facility] DEVICE REMOTES [facility]    No orders of the defined types were placed in this encounter.     -------------------------------

## 2022-11-05 ENCOUNTER — Ambulatory Visit (INDEPENDENT_AMBULATORY_CARE_PROVIDER_SITE_OTHER): Payer: Medicare Other

## 2022-11-05 DIAGNOSIS — I639 Cerebral infarction, unspecified: Secondary | ICD-10-CM

## 2022-11-06 LAB — CUP PACEART REMOTE DEVICE CHECK
Date Time Interrogation Session: 20240913230351
Implantable Pulse Generator Implant Date: 20240501

## 2022-11-11 ENCOUNTER — Encounter: Payer: Self-pay | Admitting: Family Medicine

## 2022-11-11 DIAGNOSIS — E1165 Type 2 diabetes mellitus with hyperglycemia: Secondary | ICD-10-CM

## 2022-11-14 ENCOUNTER — Ambulatory Visit (INDEPENDENT_AMBULATORY_CARE_PROVIDER_SITE_OTHER): Payer: Medicare Other

## 2022-11-14 ENCOUNTER — Ambulatory Visit: Payer: Medicare Other | Admitting: Podiatry

## 2022-11-14 ENCOUNTER — Encounter: Payer: Self-pay | Admitting: Podiatry

## 2022-11-14 VITALS — BP 158/79 | HR 63

## 2022-11-14 DIAGNOSIS — M7750 Other enthesopathy of unspecified foot: Secondary | ICD-10-CM

## 2022-11-14 DIAGNOSIS — M779 Enthesopathy, unspecified: Secondary | ICD-10-CM

## 2022-11-14 DIAGNOSIS — M2041 Other hammer toe(s) (acquired), right foot: Secondary | ICD-10-CM

## 2022-11-14 DIAGNOSIS — M7752 Other enthesopathy of left foot: Secondary | ICD-10-CM

## 2022-11-14 DIAGNOSIS — M2042 Other hammer toe(s) (acquired), left foot: Secondary | ICD-10-CM | POA: Diagnosis not present

## 2022-11-14 MED ORDER — TRIAMCINOLONE ACETONIDE 10 MG/ML IJ SUSP
10.0000 mg | Freq: Once | INTRAMUSCULAR | Status: AC
Start: 2022-11-14 — End: 2022-11-14
  Administered 2022-11-14: 10 mg via INTRA_ARTICULAR

## 2022-11-14 NOTE — Progress Notes (Signed)
Subjective:   Patient ID: Amy Mahoney, female   DOB: 72 y.o.   MRN: [id number]   HPI Patient presents with 2 separate problems with 1 being pain on the top of the left foot and secondarily digital deformities of both feet with long-term rigid contracture of the toes with irritation.  Patient does not smoke likes to be active   Review of Systems  All other systems reviewed and are negative.       Objective:  Physical Exam Vitals and nursing note reviewed.  Constitutional:      Appearance: She is well-developed.  Pulmonary:     Effort: Pulmonary effort is normal.  Musculoskeletal:        General: Normal range of motion.  Skin:    General: Skin is warm.  Neurological:     Mental Status: She is alert.     Neurovascular status found to be intact muscle strength was found to be adequate range of motion adequate with inflammation pain around the first metatarsal cuneiform and into the cuneiform navicular joint left with inflammation and discomfort in this area with significant rigid digital deformities digits 2 through 4 bilateral.  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

## 2022-11-15 ENCOUNTER — Other Ambulatory Visit: Payer: Self-pay

## 2022-11-15 DIAGNOSIS — E119 Type 2 diabetes mellitus without complications: Secondary | ICD-10-CM | POA: Insufficient documentation

## 2022-11-15 MED ORDER — TIRZEPATIDE 7.5 MG/0.5ML ~~LOC~~ SOAJ
7.5000 mg | SUBCUTANEOUS | 3 refills | Status: DC
Start: 2022-11-15 — End: 2023-11-15

## 2022-11-16 ENCOUNTER — Ambulatory Visit: Payer: Medicare Other | Attending: Cardiology | Admitting: Cardiology

## 2022-11-16 ENCOUNTER — Encounter: Payer: Self-pay | Admitting: Cardiology

## 2022-11-16 VITALS — BP 138/80 | HR 66 | Ht 65.0 in | Wt 228.0 lb

## 2022-11-16 DIAGNOSIS — I10 Essential (primary) hypertension: Secondary | ICD-10-CM

## 2022-11-16 DIAGNOSIS — E119 Type 2 diabetes mellitus without complications: Secondary | ICD-10-CM

## 2022-11-16 DIAGNOSIS — E669 Obesity, unspecified: Secondary | ICD-10-CM

## 2022-11-16 NOTE — Patient Instructions (Signed)

## 2022-11-16 NOTE — Progress Notes (Signed)
Cardiology Office Note:    Date:  11/16/2022   ID:  Amy Mahoney, DOB Dec 15, 1950, MRN [id number]  PCP:  Zola Button, Grayling Congress, DO  Cardiologist:  Garwin Brothers, MD   Referring MD: Zola Button, Grayling Congress, *    ASSESSMENT:    1. Primary hypertension   2. Diabetes mellitus without complication (HCC)   3. Obesity (BMI 30-39.9)    PLAN:    In order of problems listed above:  Primary prevention stressed with the patient.  Importance of compliance with diet medication stressed and patient verbalized standing. Essential hypertension: Blood pressure stable and diet was emphasized.  Lifestyle modification urged. Mixed dyslipidemia: On lipid-lowering medications.  Lipids reviewed and they are at goal. Diabetes mellitus and morbid obesity: Weight reduction stressed diet emphasized.  She tells me that she is lost 40 pounds with diet and water aerobics in the past several months and promises to continue this. History of recent stroke: Reviewed recovery and patient is happy about it. Patient will be seen in follow-up appointment in 6 months or earlier if the patient has any concerns.     Medication Adjustments/Labs and Tests Ordered: Current medicines are reviewed at length with the patient today.  Concerns regarding medicines are outlined above.  No orders of the defined types were placed in this encounter.  No orders of the defined types were placed in this encounter.    No chief complaint on file.    History of Present Illness:    Amy Mahoney is a 72 y.o. female.  Patient has past medical history of essential hypertension, dyslipidemia, diabetes mellitus and obesity.  He denies any problems at this time and takes care of activities of daily living.  No chest pain orthopnea or PND.  She tells me that she was admitted to the hospital with a stroke but has regained complete function with physical therapy.  No chest pain orthopnea or PND.  At the time of my evaluation, the patient is  alert awake oriented and in no distress.  Past Medical History:  Diagnosis Date   Abnormal esophagram    Abscess 04/15/2015   Abscess of skin 04/15/2015   Anxiety    Back pain    Bacterial vaginal infection 12/17/2014   Bronchitis with bronchospasm 04/14/2013   Chest heaviness 07/11/2020   Class 3 severe obesity with serious comorbidity and body mass index (BMI) of 40.0 to 44.9 in adult [facility]) 06/10/2018   Constipation    Cough 09/17/2013   DEPRESSION 09/05/2006   Qualifier: Diagnosis of  By: Janit Bern     Depression with anxiety 07/11/2020   Diabetes mellitus without complication (HCC)    DIZZINESS, CHRONIC 05/23/2006   Qualifier: Diagnosis of  By: Janit Bern     Dysphagia    FATIGUE 06/01/2009   Qualifier: Diagnosis of  By: Janit Bern     Gallbladder problem    Gastroesophageal reflux disease 05/24/2017   Heart murmur    Hyperlipidemia    Hyperlipidemia associated with type 2 diabetes mellitus (HCC) 07/11/2020   Hyperlipidemia LDL goal <100 11/06/2012   Hypothyroidism 05/23/2006   Qualifier: Diagnosis of  By: Janit Bern     Lichen planus 12/01/2020   Mild aortic stenosis 11/29/2021   MORBID OBESITY 03/30/2008   Qualifier: Diagnosis of  By: Janit Bern     Muscle spasm of back 11/19/2014   Obstructive sleep apnea 05/23/2006   NPSG 08/03/94 RDI/AHI 49/hr, weight was 285  pounds CPAP/ [facility]    Paronychia of index finger 08/08/2021   Pharyngitis 03/03/2017   Post-nasal drainage 08/03/2019   Last Assessment & Plan:  Formatting of this note might be different from the original. Concern over postnasal drainage. Chronic at least a year history of the above but over the last month seems to have worsened.  She has tried antihistamine therapy without improvement.  Denies obvious heartburn.  Some associated cough and occasional near emesis episodes.  She is not on an ACE inhibitor. 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   Subungual hematoma of fifth toe of right foot 10/29/2017   Swelling    Type 2 diabetes mellitus (HCC) 09/02/2020   Type 2 diabetes mellitus with hyperglycemia (HCC) 06/10/2018    Past Surgical History:  Procedure Laterality Date   CHOLECYSTECTOMY     DILATION AND CURETTAGE OF UTERUS     ESOPHAGOGASTRODUODENOSCOPY (EGD) WITH PROPOFOL N/A 09/05/2017   Procedure: ESOPHAGOGASTRODUODENOSCOPY (EGD) WITH PROPOFOL;  Surgeon: Beverley Fiedler, MD;  Location: [facility]OS[facility];  Service: Gastroenterology;  Laterality: N/A;   fibroids scrapping     LOOP RECORDER INSERTION N/A 06/20/2022   Procedure: LOOP RECORDER INSERTION;  Surgeon: Regan Lemming, MD;  Location: [facility] INVAS[facility];  Service: Cardiovascular;  Laterality: N/A;   TONSILLECTOMY      Current Medications: Current Meds  Medication Sig   aspirin EC 81 MG tablet Take 1 tablet (81 mg total) by mouth daily. Swallow whole.   atorvastatin (LIPITOR) 80 MG tablet TAKE 1 TABLET BY MOUTH DAILY   B-D UF III MINI PEN NEEDLES 31G X 5 MM MISC USE AS DIRECTED   Calcium Carbonate-Vitamin D (CALCIUM 500 + D PO) Take 1 tablet by mouth 2 (two) times daily.   clonazePAM (KLONOPIN) 0.5 MG tablet Take 1 tablet (0.5 mg total) by mouth daily.   fenofibrate 160 MG tablet TAKE 1 TABLET BY MOUTH DAILY   levothyroxine (SYNTHROID) 125 MCG tablet Take 1 tablet (125 mcg total) by mouth daily.   lisinopril (ZESTRIL) 5 MG tablet TAKE 1 TABLET BY MOUTH DAILY   Multiple Vitamin (MULTIVITAMIN) tablet Take 1 tablet by mouth daily.   omeprazole (PRILOSEC) 40 MG capsule Take 40 mg by mouth daily.   ONETOUCH ULTRA test strip USE AS DIRECTED UP TO 4 TIMES DAILY   tirzepatide (MOUNJARO) 7.5 MG/0.5ML Pen Inject 7.5 mg into the skin once a week.   TRESIBA FLEXTOUCH 100 UNIT/ML FlexTouch Pen INJECT SUBCUTANEOUSLY 10 UNITS  DAILY   venlafaxine (EFFEXOR) 37.5 MG tablet Take 1 tablet (37.5 mg  total) by mouth daily.   venlafaxine XR (EFFEXOR-XR) 150 MG 24 hr capsule Take 1 capsule (150 mg total) by mouth daily.   Vitamin D, Ergocalciferol, (DRISDOL) 1.25 MG (50000 UNIT) CAPS capsule TAKE 1 CAPSULE BY MOUTH EVERY 7  DAYS     Allergies:   Patient has no known allergies.   Social History   Socioeconomic History   Marital status: Divorced    Spouse name: Not on file   Number of children: 0   Years of education: Not on file   Highest education level: Master's degree (e.g., MA, MS, MEng, MEd, MSW, MBA)  Occupational History   Occupation: Eps Midwife: [employer]    Employer: [employer]   Occupation: RETIRED  Tobacco Use   Smoking status: Former  Current packs/day: 0.00    Average packs/day: 0.5 packs/day for 3.0 years (1.5 ttl pk-yrs)    Types: Cigarettes    Start date: 08/10/1987    Quit date: 08/10/1990    Years since quitting: 32.2   Smokeless tobacco: Never  Vaping Use   Vaping status: Never Used  Substance and Sexual Activity   Alcohol use: Not Currently    Comment: rarely   Drug use: No   Sexual activity: Not Currently    Partners: Male  Other Topics Concern   Not on file  Social History Narrative   Regular exercise - yes-- 10,000 steps   Caffeine use: 2 cups of coffee daily   Live  in [place] with 68 year old father to assist in care. Will be moving in with brother and his wife into larger home to facilitate better care.    Social Determinants of Health   Financial Resource Strain: Low Risk  (05/27/2022)   Overall Financial Resource Strain (CARDIA)    Difficulty of Paying Living Expenses: Not hard at all  Food Insecurity: No Food Insecurity (06/19/2022)   Hunger Vital Sign    Worried About Running Out of Food in the Last Year: Never true    Ran Out of Food in the Last Year: Never true  Transportation Needs: No Transportation Needs (06/19/2022)   PRAPARE - Administrator, Civil Service (Medical): No    Lack of  Transportation (Non-Medical): No  Physical Activity: Insufficiently Active (05/27/2022)   Exercise Vital Sign    Days of Exercise per Week: 3 days    Minutes of Exercise per Session: 20 min  Stress: No Stress Concern Present (05/27/2022)   [employer] of Occupational Health - Occupational Stress Questionnaire    Feeling of Stress : Only a little  Social Connections: Moderately Integrated (05/27/2022)   Social Connection and Isolation Panel [NHANES]    Frequency of Communication with Friends and Family: More than three times a week    Frequency of Social Gatherings with Friends and Family: More than three times a week    Attends Religious Services: More than 4 times per year    Active Member of [employer] or Organizations: Yes    Attends Engineer, structural: More than 4 times per year    Marital Status: Divorced     Family History: The patient's family history includes Anxiety disorder in her brother; Arthritis in an other family member; Breast cancer in her maternal aunt and paternal grandmother; Cancer in her maternal aunt and maternal aunt; Depression in her mother and another family member; Diabetes in her maternal aunt and mother; Hyperlipidemia in an other family member; Lung cancer in her maternal aunt; Thyroid disease in her mother. There is no history of Colon cancer, Pancreatic cancer, Stomach cancer, Esophageal cancer, Rectal cancer, or Stroke.  ROS:   Please see the history of present illness.    All other systems reviewed and are negative.  EKGs/Labs/Other Studies Reviewed:    The following studies were reviewed today: I discussed my findings with the patient at length.   Recent Labs: 06/19/2022: ALT 28 06/20/2022: BUN 16; Creatinine, Ser 0.74; Hemoglobin 13.3; Platelets 276; Potassium 3.6; Sodium 137; TSH 5.844  Recent Lipid Panel    Component Value Date/Time   CHOL 131 06/20/2022 0717   CHOL 115 04/14/2018 1019   TRIG 109 06/20/2022 0717   HDL 27 (L) 06/20/2022  0717   HDL 40 04/14/2018 1019   CHOLHDL 4.9 06/20/2022  0717   VLDL 22 06/20/2022 0717   LDLCALC 82 06/20/2022 0717   LDLCALC 60 04/14/2018 1019   LDLDIRECT 193.3 11/06/2012 0926    Physical Exam:    VS:  BP 138/80   Pulse 66   Ht 5\' 5"  (1.651 m)   Wt 228 lb (103.4 kg)   SpO2 97%   BMI 37.94 kg/m     Wt Readings from Last 3 Encounters:  11/16/22 228 lb (103.4 kg)  09/25/22 236 lb 5.3 oz (107.2 kg)  07/19/22 236 lb 6.4 oz (107.2 kg)     GEN: Patient is in no acute distress HEENT: Normal NECK: No JVD; No carotid bruits LYMPHATICS: No lymphadenopathy CARDIAC: Hear sounds regular, 2/6 systolic murmur at the apex. RESPIRATORY:  Clear to auscultation without rales, wheezing or rhonchi  ABDOMEN: Soft, non-tender, non-distended MUSCULOSKELETAL:  No edema; No deformity  SKIN: Warm and dry NEUROLOGIC:  Alert and oriented x 3 PSYCHIATRIC:  Normal affect   Signed, Garwin Brothers, MD  11/16/2022 2:59 PM    [facility]

## 2022-11-21 NOTE — Progress Notes (Signed)
[facility] Summary Report / Loop Recorder 

## 2022-11-26 ENCOUNTER — Other Ambulatory Visit: Payer: Self-pay | Admitting: Family Medicine

## 2022-11-26 DIAGNOSIS — E1165 Type 2 diabetes mellitus with hyperglycemia: Secondary | ICD-10-CM

## 2022-12-09 NOTE — Progress Notes (Unsigned)
HPI female former smoker followed for OSA, Cough, complicated by obesity, hypothyroid, GERD NPSG 08/03/94 RDI/AHI 49/hr, weight was 285 pounds  --------------------------------------------------------------------------   08/11/21- 72 year old female former smoker followed for OSA, Cough, complicated by Obesity, hypothyroid, GERD, DM2, Hyperlipidemia,  CPAP auto 10-20/Adapt Download- compliance 100%, AHI 4.1/ hr Body weight today-262 lbs Covid vax-none ------Patient feels like she is doing good and sleeping good, no concerns. She denies any concerns today and says she cannot sleep without her CPAP.  No evident mechanical problems.  Download reviewed with her.  12/10/22- 72 year old female former smoker followed for OSA, Cough, complicated by Obesity, hypothyroid, GERD, DM2, Hyperlipidemia, HTN, -clonazepam 0.5,      note lisinopril CPAP auto 10-20/Adapt Download- compliance  Body weight today-     ROS-see HPI   + = positive Constitutional:   No-   weight loss, night sweats, fevers, chills, fatigue, lassitude. HEENT:   No-  headaches, difficulty swallowing, tooth/dental problems, sore throat,       No-  sneezing, itching, ear ache, +nasal congestion, post nasal drip,  CV:  No-   chest pain, orthopnea, PND, swelling in lower extremities, anasarca,  dizziness, palpitations Resp: No-   shortness of breath with exertion or at rest.               productive cough,   non-productive cough,  No- coughing up of blood.              No-   change in color of mucus.  No- wheezing.   Skin: No-   rash or lesions. GI:  +heartburn, indigestion, abdominal pain, nausea, vomiting, GU: . MS:  No-   joint pain or swelling.   Neuro-     nothing unusual Psych:  No- change in mood or affect. No depression or anxiety.  No memory loss.  OBJ- Physical Exam General- Alert, Oriented, Affect-appropriate, Distress- none acute, + morbidly obese Skin- rash-none, lesions- none, excoriation- none Lymphadenopathy-  none Head- atraumatic            Eyes- Gross vision intact, PERRLA, conjunctivae and secretions clear            Ears- Hearing, canals-normal            Nose- Clear, no-Septal dev, mucus, polyps, erosion, perforation             Throat- Mallampati III-IV , mucosa clear , drainage- none, tonsils- atrophic Neck- flexible , trachea midline, no stridor , thyroid nl, carotid no bruit Chest - symmetrical excursion , unlabored           Heart/CV- RRR , +2/6 systolic M, no gallop  , no rub, nl s1 s2                           - JVD- none , edema- none, stasis changes- none, varices- none           Lung- clear, wheeze- none, cough- none , dullness-none, rub- none           Chest wall-  Abd-  Br/ Gen/ Rectal- Not done, not indicated Extrem- cyanosis- none, clubbing, none, atrophy- none, strength- nl Neuro- grossly intact to observation   .

## 2022-12-10 ENCOUNTER — Ambulatory Visit: Payer: Medicare Other | Admitting: Internal Medicine

## 2022-12-10 ENCOUNTER — Encounter: Payer: Self-pay | Admitting: Internal Medicine

## 2022-12-10 ENCOUNTER — Ambulatory Visit: Payer: Medicare Other | Attending: Cardiology

## 2022-12-10 VITALS — BP 120/82 | HR 82 | Ht 65.5 in | Wt 230.2 lb

## 2022-12-10 DIAGNOSIS — I639 Cerebral infarction, unspecified: Secondary | ICD-10-CM

## 2022-12-10 DIAGNOSIS — J209 Acute bronchitis, unspecified: Secondary | ICD-10-CM

## 2022-12-10 DIAGNOSIS — G4733 Obstructive sleep apnea (adult) (pediatric): Secondary | ICD-10-CM

## 2022-12-10 LAB — CUP PACEART REMOTE DEVICE CHECK
Date Time Interrogation Session: 20241020231736
Implantable Pulse Generator Implant Date: 20240501

## 2022-12-10 NOTE — Patient Instructions (Signed)
We can continue CPAP auto 10-20-- Please let us know if we can help

## 2022-12-10 NOTE — Assessment & Plan Note (Signed)
Currently doing very well. No concern

## 2022-12-10 NOTE — Assessment & Plan Note (Signed)
Ongoing problem. Water exercise encouraged.

## 2022-12-10 NOTE — Assessment & Plan Note (Signed)
Benefits from CPAP with good compliance and control Plan- continue auto 10-20 

## 2022-12-27 NOTE — Progress Notes (Signed)
[facility] Summary Report / Loop Recorder 

## 2023-01-14 ENCOUNTER — Ambulatory Visit (INDEPENDENT_AMBULATORY_CARE_PROVIDER_SITE_OTHER): Payer: Medicare Other

## 2023-01-14 DIAGNOSIS — I639 Cerebral infarction, unspecified: Secondary | ICD-10-CM | POA: Diagnosis not present

## 2023-01-14 LAB — CUP PACEART REMOTE DEVICE CHECK
Date Time Interrogation Session: 20241122230354
Implantable Pulse Generator Implant Date: 20240501

## 2023-01-15 ENCOUNTER — Ambulatory Visit (INDEPENDENT_AMBULATORY_CARE_PROVIDER_SITE_OTHER): Payer: Medicare Other | Admitting: Behavioral Health

## 2023-01-15 ENCOUNTER — Encounter: Payer: Self-pay | Admitting: Behavioral Health

## 2023-01-15 DIAGNOSIS — F321 Major depressive disorder, single episode, moderate: Secondary | ICD-10-CM

## 2023-01-15 DIAGNOSIS — F411 Generalized anxiety disorder: Secondary | ICD-10-CM

## 2023-01-15 DIAGNOSIS — F331 Major depressive disorder, recurrent, moderate: Secondary | ICD-10-CM | POA: Diagnosis not present

## 2023-01-15 MED ORDER — CLONAZEPAM 0.5 MG PO TABS: 0.5000 mg | ORAL_TABLET | Freq: Every day | ORAL | 1 refills | Status: DC

## 2023-01-15 MED ORDER — VENLAFAXINE HCL 37.5 MG PO TABS
37.5000 mg | ORAL_TABLET | Freq: Every day | ORAL | 1 refills | Status: DC
Start: 2023-01-15 — End: 2023-05-01

## 2023-01-15 MED ORDER — VENLAFAXINE HCL ER 150 MG PO CP24: 150.0000 mg | ORAL_CAPSULE | Freq: Every day | ORAL | 1 refills | Status: DC

## 2023-01-15 NOTE — Progress Notes (Signed)
[facility]  Patient ID: Amy Mahoney,  MRN: [id number]  PCP: Donato Schultz, DO  Date of Evaluation: 01/15/2023 Time spent:30 minutes  Chief Complaint:  Chief Complaint   Depression; Anxiety; Follow-up; Medication Refill; Patient Education     HISTORY/CURRENT STATUS: HPI "Amy Mahoney", 72 year old female presents to this office for follow up and medication management.  No changes this visit. Says that she currently feels stable and content.  Is doing well considering her father passed this morning after stay in hospice.  She doe not want to make any additional medication changes at this time.  She says her anxiety level today is 3 of 10 and her depression is 3 of 10.  She is sleeping 7 to 8 hours per night.  She follows up with her PCP and other specialist regularly as needed.  She denies any history of mania, no psychosis denies any history of auditory or visual hallucinations.  Denies SI or HI.  Past psychotropic medications: Effexor Klonopin         Individual Medical History/ Review of Systems: Changes? :No   Allergies: Patient has no known allergies.  Current Medications:  Current Outpatient Medications:    aspirin EC 81 MG tablet, Take 1 tablet (81 mg total) by mouth daily. Swallow whole., Disp: 30 tablet, Rfl: 12   atorvastatin (LIPITOR) 80 MG tablet, TAKE 1 TABLET BY MOUTH DAILY, Disp: 100 tablet, Rfl: 2   Calcium Carbonate-Vitamin D (CALCIUM 500 + D PO), Take 1 tablet by mouth 2 (two) times daily., Disp: , Rfl:    clonazePAM (KLONOPIN) 0.5 MG tablet, Take 1 tablet (0.5 mg total) by mouth daily., Disp: 90 tablet, Rfl: 1   fenofibrate 160 MG tablet, TAKE 1 TABLET BY MOUTH DAILY, Disp: 100 tablet, Rfl: 2   Insulin Pen Needle (BD PEN NEEDLE NANO 2ND GEN) 32G X 4 MM MISC, To inject [place], Disp: 100 each, Rfl: 3   levothyroxine (SYNTHROID) 125 MCG tablet, Take 1 tablet (125 mcg total) by mouth daily., Disp: 90 tablet, Rfl: 3   lisinopril (ZESTRIL) 5 MG  tablet, TAKE 1 TABLET BY MOUTH DAILY, Disp: 100 tablet, Rfl: 2   Multiple Vitamin (MULTIVITAMIN) tablet, Take 1 tablet by mouth daily., Disp: , Rfl:    omeprazole (PRILOSEC) 40 MG capsule, Take 40 mg by mouth daily., Disp: , Rfl:    ONETOUCH ULTRA test strip, USE AS DIRECTED UP TO 4 TIMES DAILY, Disp: 100 strip, Rfl: 1   tirzepatide (MOUNJARO) 7.5 MG/0.5ML Pen, Inject 7.5 mg into the skin once a week., Disp: 6 mL, Rfl: 3   TRESIBA FLEXTOUCH 100 UNIT/ML FlexTouch Pen, INJECT SUBCUTANEOUSLY 10 UNITS  DAILY, Disp: 15 mL, Rfl: 2   venlafaxine (EFFEXOR) 37.5 MG tablet, Take 1 tablet (37.5 mg total) by mouth daily., Disp: 90 tablet, Rfl: 1   venlafaxine XR (EFFEXOR-XR) 150 MG 24 hr capsule, Take 1 capsule (150 mg total) by mouth daily., Disp: 90 capsule, Rfl: 1   Vitamin D, Ergocalciferol, (DRISDOL) 1.25 MG (50000 UNIT) CAPS capsule, TAKE 1 CAPSULE BY MOUTH EVERY 7  DAYS, Disp: 15 capsule, Rfl: 2 Medication Side Effects: none  Family Medical/ Social History: Changes? No  MENTAL HEALTH EXAM:  There were no vitals taken for this visit.There is no height or weight on file to calculate BMI.  General Appearance: Casual, Neat, and Well Groomed  Eye Contact:  Good  Speech:  Clear and Coherent  Volume:  Normal  Mood:  NA  Affect:  Appropriate  Thought  Process:  Coherent  Orientation:  Full (Time, Place, and Person)  Thought Content: Logical   Suicidal Thoughts:  No  Homicidal Thoughts:  No  Memory:  WNL  Judgement:  Good  Insight:  Good  Psychomotor Activity:  Normal  Concentration:  Concentration: Good  Recall:  Good  Fund of Knowledge: Good  Language: Good  Assets:  Desire for Improvement  ADL's:  Intact  Cognition: WNL  Prognosis:  Good    DIAGNOSES:    ICD-10-CM   1. Generalized anxiety disorder  F41.1 clonazePAM (KLONOPIN) 0.5 MG tablet    venlafaxine (EFFEXOR) 37.5 MG tablet    2. Major depressive disorder, recurrent episode, moderate (HCC)  F33.1 clonazePAM (KLONOPIN) 0.5 MG  tablet    venlafaxine (EFFEXOR) 37.5 MG tablet    3. Depression, major, single episode, moderate (HCC)  F32.1 venlafaxine XR (EFFEXOR-XR) 150 MG 24 hr capsule      Receiving Psychotherapy: No    RECOMMENDATIONS:    Greater than 50% of 30  min face to face time with patient was spent on counseling and coordination of care. Her Discussed her current level of stability. Her father passed this morning, but was expecting.  She is very happy with her medications right now and does not want to make any changes at this time. Discussed health coping mechanisms. [facility] taking water aerobics.  We agreed to:   To continue Effexor to 187.5 mg daily.  Patient will have to continue 150 mg ER Capsule combined with a 37.5 mg capsule daily. Two separate RX. To continue Klonopin 0.5 mg twice daily for severe anxiety Will report worsening symptoms or side effects promptly Provided emergency contact information Will follow-up in 3 months to reassess Discussed potential benefits, risk, and side effects of benzodiazepines to include potential risk of tolerance and dependence, as well as possible drowsiness.  Advised patient not to drive if experiencing drowsiness and to take lowest possible effective dose to minimize risk of dependence and tolerance.  Reviewed PDMP      Joan Flores, NP

## 2023-01-27 ENCOUNTER — Encounter: Payer: Self-pay | Admitting: Family Medicine

## 2023-02-11 NOTE — Progress Notes (Signed)
[facility] Summary Report / Loop Recorder 

## 2023-02-17 LAB — CUP PACEART REMOTE DEVICE CHECK
Date Time Interrogation Session: 20241229231933
Implantable Pulse Generator Implant Date: 20240501

## 2023-02-18 ENCOUNTER — Ambulatory Visit (INDEPENDENT_AMBULATORY_CARE_PROVIDER_SITE_OTHER): Payer: Medicare Other

## 2023-02-18 DIAGNOSIS — I639 Cerebral infarction, unspecified: Secondary | ICD-10-CM | POA: Diagnosis not present

## 2023-03-12 ENCOUNTER — Ambulatory Visit (INDEPENDENT_AMBULATORY_CARE_PROVIDER_SITE_OTHER): Payer: Medicare Other | Admitting: Family Medicine

## 2023-03-12 ENCOUNTER — Encounter: Payer: Self-pay | Admitting: Family Medicine

## 2023-03-12 ENCOUNTER — Ambulatory Visit (HOSPITAL_BASED_OUTPATIENT_CLINIC_OR_DEPARTMENT_OTHER)
Admission: RE | Admit: 2023-03-12 | Discharge: 2023-03-12 | Disposition: A | Discharge status: Home / Self Care | Payer: Medicare Other | Source: Ambulatory Visit | Attending: Family Medicine | Admitting: Family Medicine

## 2023-03-12 VITALS — BP 142/88 | HR 72 | Temp 97.8°F | Resp 18 | Ht 65.5 in | Wt 240.6 lb

## 2023-03-12 DIAGNOSIS — K5901 Slow transit constipation: Secondary | ICD-10-CM

## 2023-03-12 DIAGNOSIS — I10 Essential (primary) hypertension: Secondary | ICD-10-CM | POA: Diagnosis not present

## 2023-03-12 DIAGNOSIS — E1169 Type 2 diabetes mellitus with other specified complication: Secondary | ICD-10-CM

## 2023-03-12 DIAGNOSIS — I639 Cerebral infarction, unspecified: Secondary | ICD-10-CM

## 2023-03-12 DIAGNOSIS — E785 Hyperlipidemia, unspecified: Secondary | ICD-10-CM

## 2023-03-12 DIAGNOSIS — E1165 Type 2 diabetes mellitus with hyperglycemia: Secondary | ICD-10-CM | POA: Diagnosis not present

## 2023-03-12 DIAGNOSIS — Z8673 Personal history of transient ischemic attack (TIA), and cerebral infarction without residual deficits: Secondary | ICD-10-CM

## 2023-03-12 DIAGNOSIS — K5900 Constipation, unspecified: Secondary | ICD-10-CM | POA: Diagnosis not present

## 2023-03-12 LAB — CBC WITH DIFFERENTIAL/PLATELET
Basophils Absolute: 0 10*3/uL (ref 0.0–0.1)
Basophils Relative: 0.7 % (ref 0.0–3.0)
Eosinophils Absolute: 0.5 10*3/uL (ref 0.0–0.7)
Eosinophils Relative: 7.1 % — ABNORMAL HIGH (ref 0.0–5.0)
HCT: 36.8 % (ref 36.0–46.0)
Hemoglobin: 12.1 g/dL (ref 12.0–15.0)
Lymphocytes Relative: 28.9 % (ref 12.0–46.0)
Lymphs Abs: 2 10*3/uL (ref 0.7–4.0)
MCHC: 33 g/dL (ref 30.0–36.0)
MCV: 96.6 fL (ref 78.0–100.0)
Monocytes Absolute: 0.4 10*3/uL (ref 0.1–1.0)
Monocytes Relative: 6.3 % (ref 3.0–12.0)
Neutro Abs: 3.9 10*3/uL (ref 1.4–7.7)
Neutrophils Relative %: 57 % (ref 43.0–77.0)
Platelets: 330 10*3/uL (ref 150.0–400.0)
RBC: 3.81 Mil/uL — ABNORMAL LOW (ref 3.87–5.11)
RDW: 14.6 % (ref 11.5–15.5)
WBC: 6.8 10*3/uL (ref 4.0–10.5)

## 2023-03-12 LAB — COMPREHENSIVE METABOLIC PANEL
ALT: 17 U/L (ref 0–35)
AST: 29 U/L (ref 0–37)
Albumin: 4.1 g/dL (ref 3.5–5.2)
Alkaline Phosphatase: 41 U/L (ref 39–117)
BUN: 19 mg/dL (ref 6–23)
CO2: 28 meq/L (ref 19–32)
Calcium: 9.5 mg/dL (ref 8.4–10.5)
Chloride: 105 meq/L (ref 96–112)
Creatinine, Ser: 0.9 mg/dL (ref 0.40–1.20)
GFR: 63.48 mL/min (ref >60.00)
Glucose, Bld: 95 mg/dL (ref 70–99)
Potassium: 4.7 meq/L (ref 3.5–5.1)
Sodium: 141 meq/L (ref 135–145)
Total Bilirubin: 0.4 mg/dL (ref 0.2–1.2)
Total Protein: 6.4 g/dL (ref 6.0–8.3)

## 2023-03-12 LAB — LIPID PANEL
Cholesterol: 109 mg/dL (ref 0–200)
HDL: 24.9 mg/dL — ABNORMAL LOW (ref >39.00)
LDL Cholesterol: 55 mg/dL (ref 0–99)
NonHDL: 83.98
Total CHOL/HDL Ratio: 4
Triglycerides: 146 mg/dL (ref 0.0–149.0)
VLDL: 29.2 mg/dL (ref 0.0–40.0)

## 2023-03-12 LAB — MICROALBUMIN / CREATININE URINE RATIO
Creatinine,U: 46.4 mg/dL
Microalb Creat Ratio: 10.9 mg/g (ref 0.0–30.0)
Microalb, Ur: 5.1 mg/dL — ABNORMAL HIGH (ref 0.0–1.9)

## 2023-03-12 LAB — HEMOGLOBIN A1C: Hgb A1c MFr Bld: 6.1 % (ref 4.6–6.5)

## 2023-03-12 LAB — TSH: TSH: 89.09 u[IU]/mL — ABNORMAL HIGH (ref 0.35–5.50)

## 2023-03-12 NOTE — Progress Notes (Signed)
Established Patient Office Visit  Subjective  Patient ID: Amy Mahoney, female    DOB: 1950/09/18  Age: 73 y.o. MRN: [id number]  Chief Complaint  Patient presents with   GI Problem    Pt states not having a small bowel movements. Pt states she thinks she is having discharge. Pt states try Miralax and other otc meds    HPI Discussed the use of AI scribe software for clinical note transcription with the patient, who gave verbal consent to proceed.  History of Present Illness   The patient, who recently experienced the loss of her father, presents with a week-long history of constipation. She reports a lack of satisfactory bowel movements since the previous Wednesday, despite attempts at self-treatment with prune juice and MiraLax. The patient describes the stool as gel-like and brown, with occasional small, formed stools. She denies any abdominal pain on palpation.  The patient also reports a recent change in eating habits, having consumed more food than usual during the month of December, but has since reduced her intake due to the ongoing constipation. She has been consuming lighter meals such as soup and poached eggs.  The patient's medical history includes diabetes, for which she monitors her blood sugar levels regularly. She reported a blood sugar level of 100 the previous day. She also takes calcium regularly.  The patient's recent bereavement and changes in dietary habits may have contributed to her current gastrointestinal symptoms. However, the patient denies any changes in medication that could have precipitated the constipation.      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   Mild aortic stenosis 11/29/2021   Paronychia of index finger 08/08/2021   Hyperlipidemia 02/23/2021   Preventative health care 01/10/2021   Lichen planus 12/01/2020   Anxiety 09/02/2020   Back pain  09/02/2020   Constipation 09/02/2020   Type 2 diabetes mellitus (HCC) 09/02/2020   Gallbladder problem 09/02/2020   Heart murmur 09/02/2020   Prediabetes 09/02/2020   Spider bite 09/02/2020   Swelling 09/02/2020   Depression with anxiety 07/11/2020   Chest heaviness 07/11/2020   Hyperlipidemia associated with type 2 diabetes mellitus (HCC) 07/11/2020   Primary hypertension 07/11/2020   Post-nasal drainage 08/03/2019   Class 3 severe obesity with serious comorbidity and body mass index (BMI) of 40.0 to 44.9 in adult [facility]) 06/10/2018   Type 2 diabetes mellitus with hyperglycemia (HCC) 06/10/2018   Subungual hematoma of fifth toe of right foot 10/29/2017   Dysphagia    Abnormal esophagram    Gastroesophageal reflux disease 05/24/2017   Pharyngitis 03/03/2017   Abscess 04/15/2015   Abscess of skin 04/15/2015   Bacterial vaginal infection 12/17/2014   Muscle spasm of back 11/19/2014   Cough 09/17/2013   Bronchitis with bronchospasm 04/14/2013   Hyperlipidemia LDL goal <100 11/06/2012   FATIGUE 06/01/2009   Morbid obesity (HCC) 03/30/2008   DEPRESSION 09/05/2006   Hypothyroidism 05/23/2006   DIZZINESS, CHRONIC 05/23/2006   Obstructive sleep apnea 05/23/2006   Past Medical History:  Diagnosis Date   Abnormal esophagram    Abscess 04/15/2015   Abscess of skin 04/15/2015   Anxiety    Back pain    Bacterial vaginal infection 12/17/2014   Bronchitis with bronchospasm 04/14/2013   Chest heaviness 07/11/2020   Class 3 severe obesity with serious comorbidity and body mass index (BMI) of 40.0 to 44.9 in adult [facility]) 06/10/2018   Constipation  Cough 09/17/2013   DEPRESSION 09/05/2006   Qualifier: Diagnosis of  By: Janit Bern     Depression with anxiety 07/11/2020   Diabetes mellitus without complication (HCC)    DIZZINESS, CHRONIC 05/23/2006   Qualifier: Diagnosis of  By: Janit Bern     Dysphagia    FATIGUE 06/01/2009   Qualifier: Diagnosis of  By: Janit Bern      Gallbladder problem    Gastroesophageal reflux disease 05/24/2017   Heart murmur    Hyperlipidemia    Hyperlipidemia associated with type 2 diabetes mellitus (HCC) 07/11/2020   Hyperlipidemia LDL goal <100 11/06/2012   Hypothyroidism 05/23/2006   Qualifier: Diagnosis of  By: Janit Bern     Lichen planus 12/01/2020   Mild aortic stenosis 11/29/2021   MORBID OBESITY 03/30/2008   Qualifier: Diagnosis of  By: Janit Bern     Muscle spasm of back 11/19/2014   Obstructive sleep apnea 05/23/2006   NPSG 08/03/94 RDI/AHI 49/hr, weight was 285 pounds CPAP/ Advanced    Paronychia of index finger 08/08/2021   Pharyngitis 03/03/2017   Post-nasal drainage 08/03/2019   Last Assessment & Plan:  Formatting of this note might be different from the original. Concern over postnasal drainage. Chronic at least a year history of the above but over the last month seems to have worsened.  She has tried antihistamine therapy without improvement.  Denies obvious heartburn.  Some associated cough and occasional near emesis episodes.  She is not on an ACE inhibitor. 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   Subungual hematoma of fifth toe of right foot 10/29/2017   Swelling    Type 2 diabetes mellitus (HCC) 09/02/2020   Type 2 diabetes mellitus with hyperglycemia (HCC) 06/10/2018   Past Surgical History:  Procedure Laterality Date   CHOLECYSTECTOMY     DILATION AND CURETTAGE OF UTERUS     ESOPHAGOGASTRODUODENOSCOPY (EGD) WITH PROPOFOL N/A 09/05/2017   Procedure: ESOPHAGOGASTRODUODENOSCOPY (EGD) WITH PROPOFOL;  Surgeon: Beverley Fiedler, MD;  Location: [facility] END[facility];  Service: Gastroenterology;  Laterality: N/A;   fibroids scrapping     LOOP RECORDER INSERTION N/A 06/20/2022   Procedure: LOOP RECORDER INSERTION;  Surgeon: Regan Lemming, MD;  Location: [facility];  Service:  Cardiovascular;  Laterality: N/A;   TONSILLECTOMY     Social History   Tobacco Use   Smoking status: Former    Current packs/day: 0.00    Average packs/day: 0.5 packs/day for 3.0 years (1.5 ttl pk-yrs)    Types: Cigarettes    Start date: 08/10/1987    Quit date: 08/10/1990    Years since quitting: 32.6   Smokeless tobacco: Never  Vaping Use   Vaping status: Never Used  Substance Use Topics   Alcohol use: Not Currently    Comment: rarely   Drug use: No   Social History   Socioeconomic History   Marital status: Divorced    Spouse name: Not on file   Number of children: 0   Years of education: Not on file   Highest education level: Bachelor's degree (e.g., BA, AB, BS)  Occupational History   Occupation: Product manager: [employer]    Employer: [employer]   Occupation: RETIRED  Tobacco Use   Smoking status: Former    Current packs/day: 0.00    Average packs/day: 0.5  packs/day for 3.0 years (1.5 ttl pk-yrs)    Types: Cigarettes    Start date: 08/10/1987    Quit date: 08/10/1990    Years since quitting: 32.6   Smokeless tobacco: Never  Vaping Use   Vaping status: Never Used  Substance and Sexual Activity   Alcohol use: Not Currently    Comment: rarely   Drug use: No   Sexual activity: Not Currently    Partners: Male  Other Topics Concern   Not on file  Social History Narrative   Regular exercise - yes-- 10,000 steps   Caffeine use: 2 cups of coffee daily   Live  in [place] with 62 year old father to assist in care. Will be moving in with brother and his wife into larger home to facilitate better care.    Social Drivers of Corporate investment banker Strain: Low Risk  (03/10/2023)   Overall Financial Resource Strain (CARDIA)    Difficulty of Paying Living Expenses: Not hard at all  Food Insecurity: No Food Insecurity (03/10/2023)   Hunger Vital Sign    Worried About Running Out of Food in the Last Year: Never true    Ran Out of Food in  the Last Year: Never true  Transportation Needs: No Transportation Needs (03/10/2023)   PRAPARE - Administrator, Civil Service (Medical): No    Lack of Transportation (Non-Medical): No  Physical Activity: Sufficiently Active (03/10/2023)   Exercise Vital Sign    Days of Exercise per Week: 4 days    Minutes of Exercise per Session: 60 min  Stress: No Stress Concern Present (03/10/2023)   [employer] of Occupational Health - Occupational Stress Questionnaire    Feeling of Stress : Only a little  Social Connections: Moderately Integrated (03/10/2023)   Social Connection and Isolation Panel [NHANES]    Frequency of Communication with Friends and Family: More than three times a week    Frequency of Social Gatherings with Friends and Family: Twice a week    Attends Religious Services: More than 4 times per year    Active Member of [employer] or Organizations: Yes    Attends Engineer, structural: More than 4 times per year    Marital Status: Divorced  Intimate Partner Violence: Not At Risk (06/19/2022)   Humiliation, Afraid, Rape, and Kick questionnaire    Fear of Current or Ex-Partner: No    Emotionally Abused: No    Physically Abused: No    Sexually Abused: No   Family Status  Relation Name Status   Mother  Deceased   Father  Alive   Brother  (Not Specified)   Mat Aunt  (Not Specified)   Mat Aunt  (Not Specified)   Mat Aunt  (Not Specified)   PGM  (Not Specified)   Other  (Not Specified)   Other  (Not Specified)   Other  (Not Specified)   Neg Hx  (Not Specified)  No partnership data on file   Family History  Problem Relation Age of Onset   Diabetes Mother    Thyroid disease Mother    Depression Mother    Anxiety disorder Brother    Diabetes Maternal Aunt    Lung cancer Maternal Aunt        Nonsmoker   Cancer Maternal Aunt        breast   Breast cancer Maternal Aunt    Cancer Maternal Aunt        lung(nonsmoker)   Breast  cancer Paternal Grandmother     Depression Other    Hyperlipidemia Other    Arthritis Other    Colon cancer Neg Hx    Pancreatic cancer Neg Hx    Stomach cancer Neg Hx    Esophageal cancer Neg Hx    Rectal cancer Neg Hx    Stroke Neg Hx    No Known Allergies    ROS    Objective:     BP (!) 142/88 (BP Location: Left Arm, Patient Position: Sitting)   Pulse 72   Temp 97.8 F (36.6 C) (Oral)   Resp 18   Ht 5' 5.5" (1.664 m)   Wt 240 lb 9.6 oz (109.1 kg)   SpO2 98%   BMI 39.43 kg/m  BP Readings from Last 3 Encounters:  03/12/23 (!) 142/88  12/10/22 120/82  11/16/22 138/80   Wt Readings from Last 3 Encounters:  03/12/23 240 lb 9.6 oz (109.1 kg)  12/10/22 230 lb 3.2 oz (104.4 kg)  11/16/22 228 lb (103.4 kg)   SpO2 Readings from Last 3 Encounters:  03/12/23 98%  12/10/22 99%  11/16/22 97%      Physical Exam   No results found for any visits on 03/12/23.  Last CBC Lab Results  Component Value Date   WBC 6.8 03/12/2023   HGB 12.1 03/12/2023   HCT 36.8 03/12/2023   MCV 96.6 03/12/2023   MCH 30.2 06/20/2022   RDW 14.6 03/12/2023   PLT 330.0 03/12/2023   Last metabolic panel Lab Results  Component Value Date   GLUCOSE 95 03/12/2023   NA 141 03/12/2023   K 4.7 03/12/2023   CL 105 03/12/2023   CO2 28 03/12/2023   BUN 19 03/12/2023   CREATININE 0.90 03/12/2023   GFR 63.48 03/12/2023   CALCIUM 9.5 03/12/2023   PROT 6.4 03/12/2023   ALBUMIN 4.1 03/12/2023   LABGLOB 2.4 10/06/2018   AGRATIO 1.8 10/06/2018   BILITOT 0.4 03/12/2023   ALKPHOS 41 03/12/2023   AST 29 03/12/2023   ALT 17 03/12/2023   ANIONGAP 6 06/20/2022   Last lipids Lab Results  Component Value Date   CHOL 109 03/12/2023   HDL 24.90 (L) 03/12/2023   LDLCALC 55 03/12/2023   LDLDIRECT 193.3 11/06/2012   TRIG 146.0 03/12/2023   CHOLHDL 4 03/12/2023   Last hemoglobin A1c Lab Results  Component Value Date   HGBA1C 6.1 03/12/2023   Last thyroid functions Lab Results  Component Value Date   TSH 89.09 (H)  03/12/2023   T3TOTAL 106 01/01/2018   Last vitamin D Lab Results  Component Value Date   VD25OH 55.56 02/02/2022   Last vitamin B12 and Folate Lab Results  Component Value Date   VITAMINB12 724 10/06/2018   FOLATE >20.0 ng/mL 06/01/2009      The ASCVD Risk score (Arnett DK, et al., 2019) failed to calculate for the following reasons:   Risk score cannot be calculated because patient has a medical history suggesting prior/existing ASCVD    Assessment & Plan:   Problem List Items Addressed This Visit       Unprioritized   Constipation - Primary   Relevant Orders   DG Abd 2 Views   TSH   Type 2 diabetes mellitus with hyperglycemia (HCC)   Relevant Orders   Comprehensive metabolic panel   Hemoglobin A1c   Microalbumin / creatinine urine ratio   Stroke (cerebrum) (HCC)   Primary hypertension   Relevant Orders   CBC with Differential/Platelet  Comprehensive metabolic panel   Hyperlipidemia associated with type 2 diabetes mellitus (HCC)   Relevant Orders   Lipid panel   Comprehensive metabolic panel  Assessment and Plan    Constipation Experiencing constipation for one week with minimal bowel movements, bloating, and passage of brown gel-like stool with occasional small formed stool. No significant pain or medication changes, but recent dietary changes noted. Physical exam shows no tenderness. Potential causes include dietary changes, calcium intake, and Mounjaro. An abdominal x-ray will assess stool burden and potential blockage. Treatment options include gentle relief with Ducalex and Senokot, or more aggressive treatment with megacitrate, which can cause rapid bowel movements requiring immediate bathroom access. Order abdominal x-ray. Recommend Ducalex and Senokot for gentle relief. Consider megacitrate if more aggressive treatment is needed.  General Health Maintenance Monitor calcium intake and evaluate thyroid function if symptoms persist.        No follow-ups  on file.    Donato Schultz, DO

## 2023-03-16 ENCOUNTER — Encounter: Payer: Self-pay | Admitting: Family Medicine

## 2023-03-16 ENCOUNTER — Other Ambulatory Visit: Payer: Self-pay | Admitting: Family Medicine

## 2023-03-16 DIAGNOSIS — E039 Hypothyroidism, unspecified: Secondary | ICD-10-CM

## 2023-03-16 NOTE — Patient Instructions (Signed)
Preventive Care 87 Years and Older, Female Preventive care refers to lifestyle choices and visits with your health care provider that can promote health and wellness. Preventive care visits are also called wellness exams. What can I expect for my preventive care visit? Counseling Your health care provider may ask you questions about your: Medical history, including: Past medical problems. Family medical history. Pregnancy and menstrual history. History of falls. Current health, including: Memory and ability to understand (cognition). Emotional well-being. Home life and relationship well-being. Sexual activity and sexual health. Lifestyle, including: Alcohol, nicotine or tobacco, and drug use. Access to firearms. Diet, exercise, and sleep habits. Work and work Astronomer. Sunscreen use. Safety issues such as seatbelt and bike helmet use. Physical exam Your health care provider will check your: Height and weight. These may be used to calculate your BMI (body mass index). BMI is a measurement that tells if you are at a healthy weight. Waist circumference. This measures the distance around your waistline. This measurement also tells if you are at a healthy weight and may help predict your risk of certain diseases, such as type 2 diabetes and high blood pressure. Heart rate and blood pressure. Body temperature. Skin for abnormal spots. What immunizations do I need?  Vaccines are usually given at various ages, according to a schedule. Your health care provider will recommend vaccines for you based on your age, medical history, and lifestyle or other factors, such as travel or where you work. What tests do I need? Screening Your health care provider may recommend screening tests for certain conditions. This may include: Lipid and cholesterol levels. Hepatitis C test. Hepatitis B test. HIV (human immunodeficiency virus) test. STI (sexually transmitted infection) testing, if you are at  risk. Lung cancer screening. Colorectal cancer screening. Diabetes screening. This is done by checking your blood sugar (glucose) after you have not eaten for a while (fasting). Mammogram. Talk with your health care provider about how often you should have regular mammograms. BRCA-related cancer screening. This may be done if you have a family history of breast, ovarian, tubal, or peritoneal cancers. Bone density scan. This is done to screen for osteoporosis. Talk with your health care provider about your test results, treatment options, and if necessary, the need for more tests. Follow these instructions at home: Eating and drinking  Eat a diet that includes fresh fruits and vegetables, whole grains, lean protein, and low-fat dairy products. Limit your intake of foods with high amounts of sugar, saturated fats, and salt. Take vitamin and mineral supplements as recommended by your health care provider. Do not drink alcohol if your health care provider tells you not to drink. If you drink alcohol: Limit how much you have to 0-1 drink a day. Know how much alcohol is in your drink. In the [place] one drink equals one 12 oz bottle of beer (355 mL), one 5 oz glass of wine (148 mL), or one 1 oz glass of hard liquor (44 mL). Lifestyle Brush your teeth every morning and night with fluoride toothpaste. Floss one time each day. Exercise for at least 30 minutes 5 or more days each week. Do not use any products that contain nicotine or tobacco. These products include cigarettes, chewing tobacco, and vaping devices, such as e-cigarettes. If you need help quitting, ask your health care provider. Do not use drugs. If you are sexually active, practice safe sex. Use a condom or other form of protection in order to prevent STIs. Take aspirin only as told by  your health care provider. Make sure that you understand how much to take and what form to take. Work with your health care provider to find out whether it  is safe and beneficial for you to take aspirin daily. Ask your health care provider if you need to take a cholesterol-lowering medicine (statin). Find healthy ways to manage stress, such as: Meditation, yoga, or listening to music. Journaling. Talking to a trusted person. Spending time with friends and family. Minimize exposure to UV radiation to reduce your risk of skin cancer. Safety Always wear your seat belt while driving or riding in a vehicle. Do not drive: If you have been drinking alcohol. Do not ride with someone who has been drinking. When you are tired or distracted. While texting. If you have been using any mind-altering substances or drugs. Wear a helmet and other protective equipment during sports activities. If you have firearms in your house, make sure you follow all gun safety procedures. What's next? Visit your health care provider once a year for an annual wellness visit. Ask your health care provider how often you should have your eyes and teeth checked. Stay up to date on all vaccines. This information is not intended to replace advice given to you by your health care provider. Make sure you discuss any questions you have with your health care provider. Document Revised: 08/03/2020 Document Reviewed: 08/03/2020 Elsevier Patient Education  2024 [employer].

## 2023-03-19 ENCOUNTER — Other Ambulatory Visit: Payer: Self-pay

## 2023-03-19 MED ORDER — LEVOTHYROXINE SODIUM 112 MCG PO TABS: 112.0000 ug | ORAL_TABLET | Freq: Every day | ORAL | 0 refills | Status: DC

## 2023-03-25 ENCOUNTER — Ambulatory Visit (INDEPENDENT_AMBULATORY_CARE_PROVIDER_SITE_OTHER): Payer: Medicare Other

## 2023-03-25 DIAGNOSIS — I639 Cerebral infarction, unspecified: Secondary | ICD-10-CM

## 2023-03-25 LAB — CUP PACEART REMOTE DEVICE CHECK
Date Time Interrogation Session: 20250202231907
Implantable Pulse Generator Implant Date: 20240501

## 2023-04-10 ENCOUNTER — Other Ambulatory Visit: Payer: Self-pay | Admitting: Family Medicine

## 2023-04-10 DIAGNOSIS — F321 Major depressive disorder, single episode, moderate: Secondary | ICD-10-CM

## 2023-04-10 DIAGNOSIS — E1165 Type 2 diabetes mellitus with hyperglycemia: Secondary | ICD-10-CM

## 2023-04-17 ENCOUNTER — Ambulatory Visit: Payer: Medicare Other | Admitting: Behavioral Health

## 2023-04-24 IMAGING — DX DG CHEST 2V
2 series · 2 of 2 positions shown · non-contrast
Comparison: 02/09/2017

CLINICAL DATA: 69-year-old female with a history right-sided chest
pressure

EXAM:
CHEST - 2 VIEW

[chest pa]
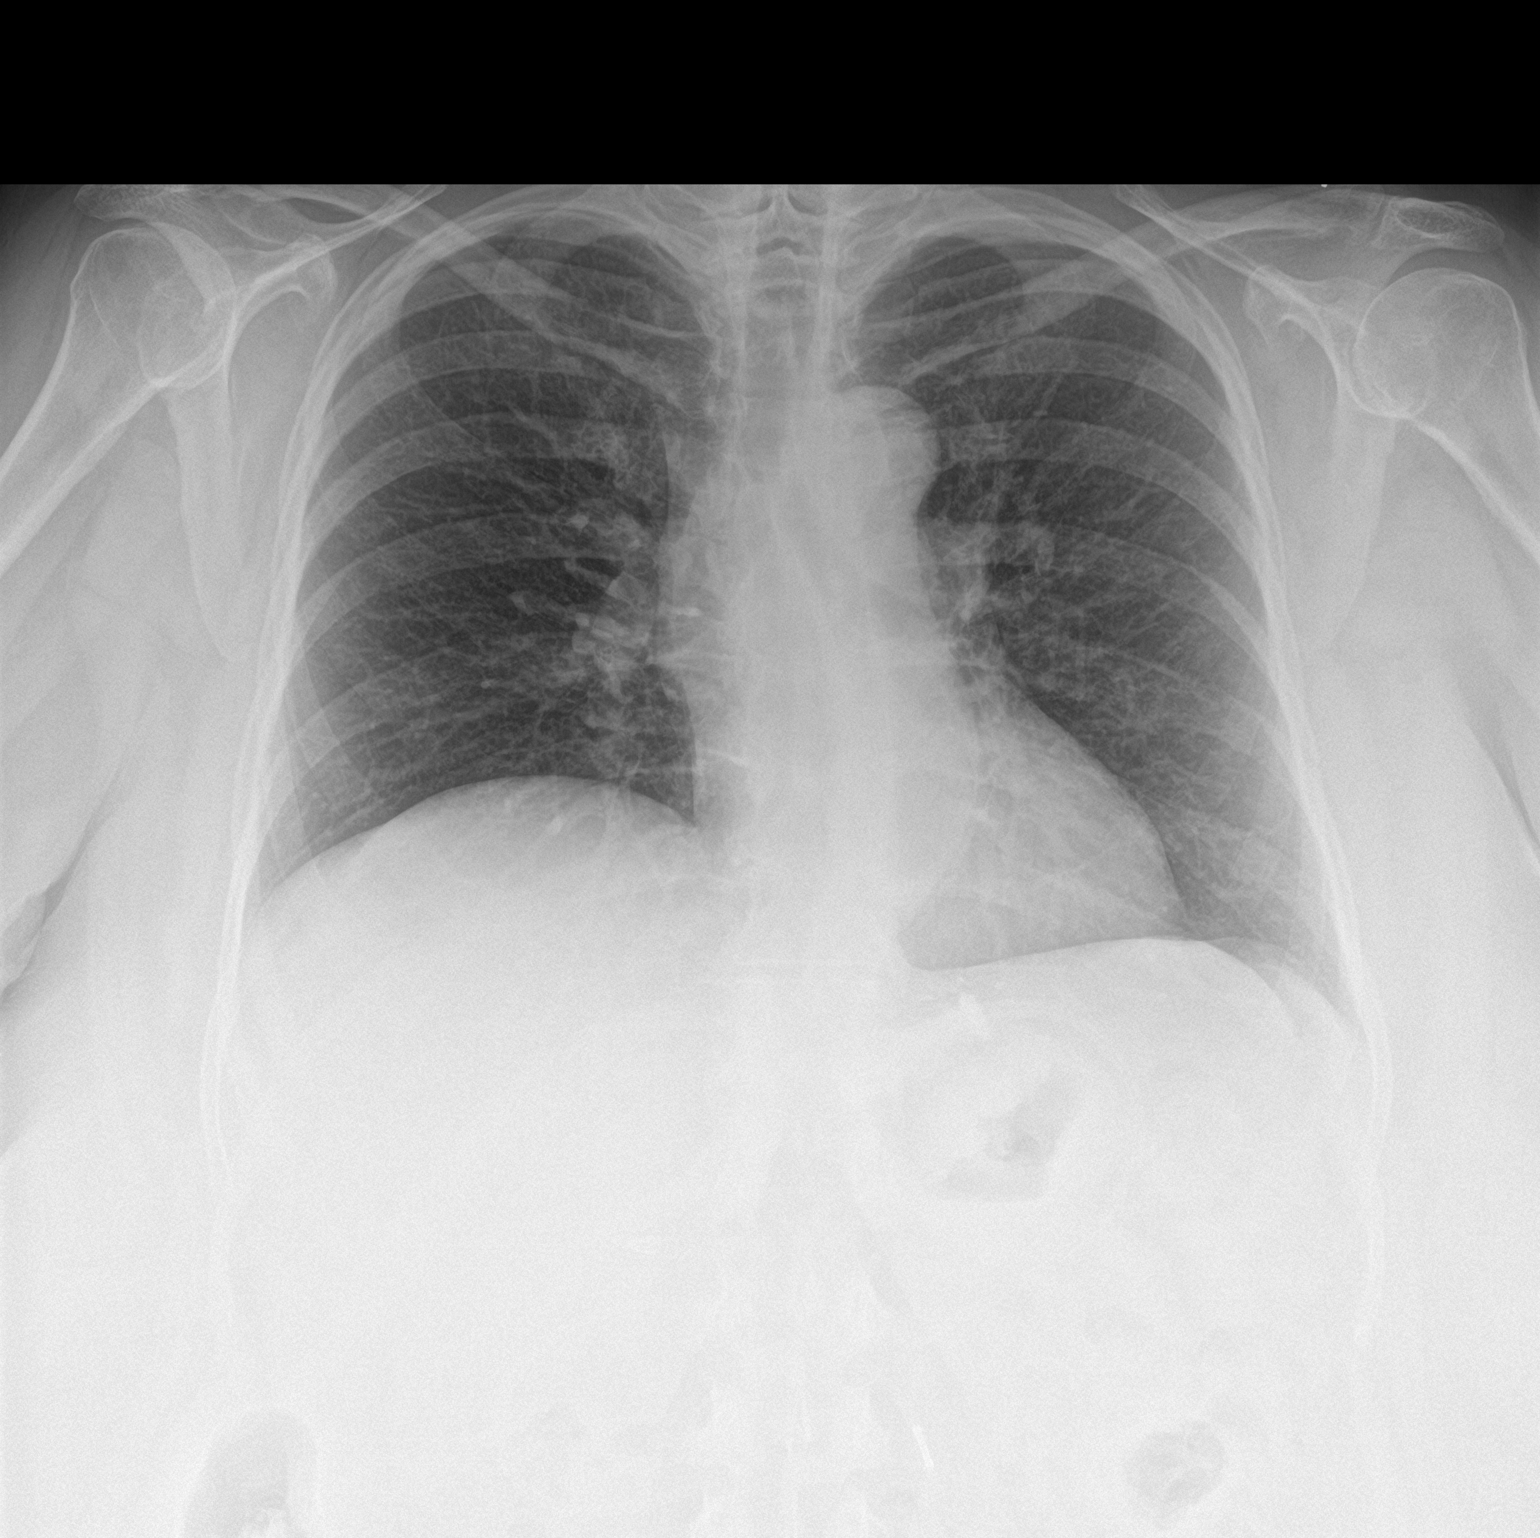

[chest lat]
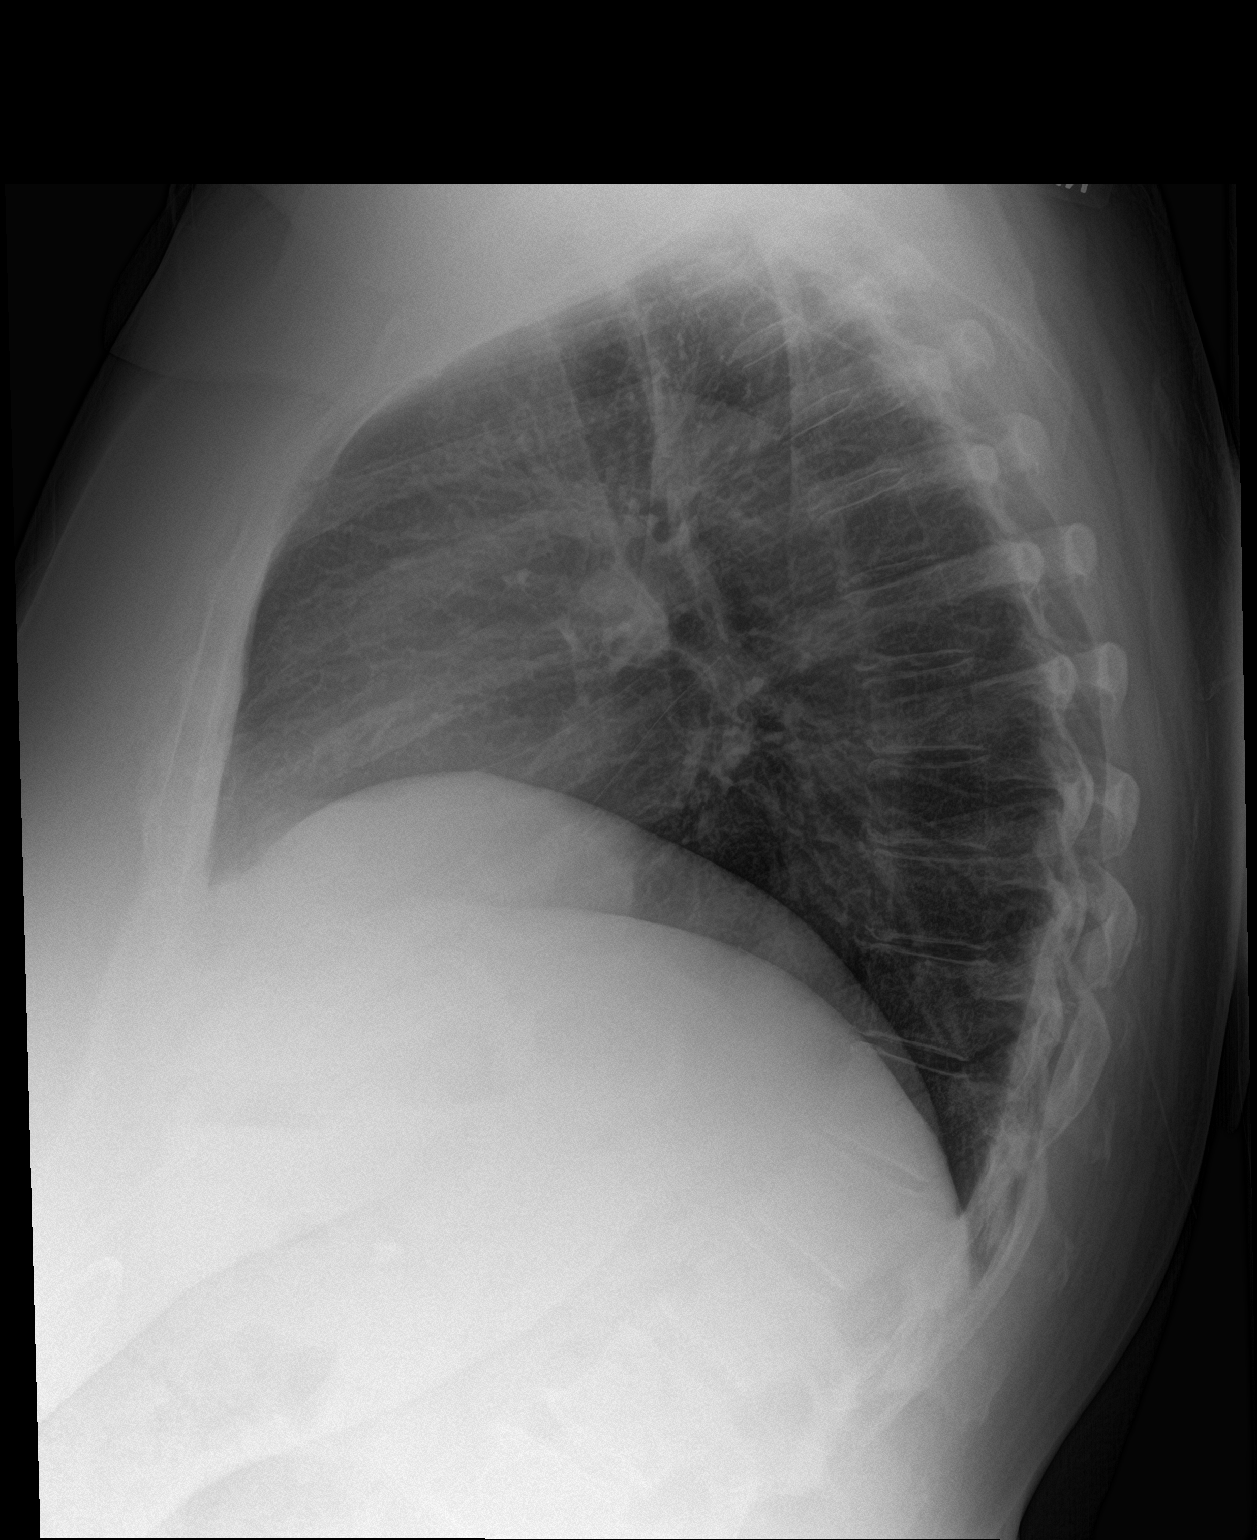

[2 of 2 positions shown; findings below may reference images not displayed]

FINDINGS: Cardiomediastinal silhouette unchanged in size and contour. No
evidence of central vascular congestion. No interlobular septal
thickening. No pneumothorax or pleural effusion. Coarsened
interstitial markings, with no confluent airspace disease.

No acute displaced fracture degenerative changes of the spine.
Accentuated kyphotic curvature unchanged
IMPRESSION: Chest negative for acute cardiopulmonary disease

## 2023-04-29 ENCOUNTER — Ambulatory Visit: Payer: Medicare Other

## 2023-04-29 DIAGNOSIS — I639 Cerebral infarction, unspecified: Secondary | ICD-10-CM

## 2023-04-29 LAB — CUP PACEART REMOTE DEVICE CHECK
Date Time Interrogation Session: 20250309231921
Implantable Pulse Generator Implant Date: 20240501

## 2023-05-01 ENCOUNTER — Encounter: Payer: Self-pay | Admitting: Behavioral Health

## 2023-05-01 ENCOUNTER — Ambulatory Visit: Payer: Medicare Other | Admitting: Behavioral Health

## 2023-05-01 DIAGNOSIS — F411 Generalized anxiety disorder: Secondary | ICD-10-CM

## 2023-05-01 DIAGNOSIS — F321 Major depressive disorder, single episode, moderate: Secondary | ICD-10-CM

## 2023-05-01 DIAGNOSIS — F331 Major depressive disorder, recurrent, moderate: Secondary | ICD-10-CM

## 2023-05-01 MED ORDER — CLONAZEPAM 0.5 MG PO TABS: 0.5000 mg | ORAL_TABLET | Freq: Every day | ORAL | 1 refills | Status: DC

## 2023-05-01 MED ORDER — VENLAFAXINE HCL ER 150 MG PO CP24: 150.0000 mg | ORAL_CAPSULE | Freq: Every day | ORAL | 1 refills | Status: DC

## 2023-05-01 MED ORDER — VENLAFAXINE HCL 37.5 MG PO TABS
37.5000 mg | ORAL_TABLET | Freq: Every day | ORAL | 1 refills | Status: DC
Start: 2023-05-01 — End: 2023-08-30

## 2023-05-01 NOTE — Progress Notes (Deleted)
 [facility]  Patient ID: Amy Mahoney,  MRN: [id number]  PCP: Donato Schultz, DO  Date of Evaluation: 05/01/2023 Time spent:{TIME; 0 MIN TO 60 MIN:[id number]}  Chief Complaint:   HISTORY/CURRENT STATUS: HPI  Individual Medical History/ Review of Systems: Changes? :{EXAM; YES/NO:21197}  Allergies: Patient has no known allergies.  Current Medications:  Current Outpatient Medications:    levothyroxine (SYNTHROID) 112 MCG tablet, Take 1 tablet (112 mcg total) by mouth daily., Disp: 90 tablet, Rfl: 0   aspirin EC 81 MG tablet, Take 1 tablet (81 mg total) by mouth daily. Swallow whole., Disp: 30 tablet, Rfl: 12   atorvastatin (LIPITOR) 80 MG tablet, TAKE 1 TABLET BY MOUTH DAILY, Disp: 100 tablet, Rfl: 2   Calcium Carbonate-Vitamin D (CALCIUM 500 + D PO), Take 1 tablet by mouth 2 (two) times daily., Disp: , Rfl:    clonazePAM (KLONOPIN) 0.5 MG tablet, Take 1 tablet (0.5 mg total) by mouth daily., Disp: 90 tablet, Rfl: 1   fenofibrate 160 MG tablet, TAKE 1 TABLET BY MOUTH DAILY, Disp: 100 tablet, Rfl: 2   Insulin Pen Needle (BD PEN NEEDLE NANO 2ND GEN) 32G X 4 MM MISC, To inject [place], Disp: 100 each, Rfl: 3   lisinopril (ZESTRIL) 5 MG tablet, Take 1 tablet (5 mg total) by mouth daily., Disp: 90 tablet, Rfl: 1   Multiple Vitamin (MULTIVITAMIN) tablet, Take 1 tablet by mouth daily., Disp: , Rfl:    omeprazole (PRILOSEC) 40 MG capsule, Take 40 mg by mouth daily., Disp: , Rfl:    ONETOUCH ULTRA test strip, USE AS DIRECTED UP TO 4 TIMES DAILY, Disp: 100 strip, Rfl: 1   tirzepatide (MOUNJARO) 7.5 MG/0.5ML Pen, Inject 7.5 mg into the skin once a week., Disp: 6 mL, Rfl: 3   TRESIBA FLEXTOUCH 100 UNIT/ML FlexTouch Pen, INJECT SUBCUTANEOUSLY 10 UNITS  DAILY, Disp: 15 mL, Rfl: 2   venlafaxine (EFFEXOR) 37.5 MG tablet, Take 1 tablet (37.5 mg total) by mouth daily., Disp: 90 tablet, Rfl: 1   venlafaxine XR (EFFEXOR-XR) 150 MG 24 hr capsule, Take 1 capsule (150 mg total) by mouth  daily., Disp: 90 capsule, Rfl: 1   Vitamin D, Ergocalciferol, (DRISDOL) 1.25 MG (50000 UNIT) CAPS capsule, TAKE 1 CAPSULE BY MOUTH EVERY 7  DAYS, Disp: 15 capsule, Rfl: 2 Medication Side Effects: {Medication Side Effects (Optional):12147}  Family Medical/ Social History: Changes? {EXAM; YES/NO:19492}  MENTAL HEALTH EXAM:  There were no vitals taken for this visit.There is no height or weight on file to calculate BMI.  General Appearance: {PSY:[id number]}  Eye Contact:  {PSY:22684}  Speech:  {PSY:309-005-1721}  Volume:  {PSY:22686}  Mood:  {PSY:22306}  Affect:  {PSY:312-686-8128}  Thought Process:  {PSY:22688}  Orientation:  {PSY:22689}  Thought Content: {PSYt:22690}   Suicidal Thoughts:  {PSY:22692}  Homicidal Thoughts:  {PSY:22692}  Memory:  {PSY:[phone]}  Judgement:  {PSY:22694}  Insight:  {PSY:22695}  Psychomotor Activity:  {PSY:22696}  Concentration:  {PSY:21399}  Recall:  {PSY:22877}  Fund of Knowledge: {PSY:22877}  Language: {WRU:04540}  Assets:  {PSY:22698}  ADL's:  {PSY:22290}  Cognition: {PSY:304700322}  Prognosis:  {PSY:22877}    DIAGNOSES: No diagnosis found.  Receiving Psychotherapy: {JWJ:19147}   RECOMMENDATIONS: ***   Joan Flores, NP

## 2023-05-01 NOTE — Progress Notes (Signed)
 [facility]  Patient ID: Amy Mahoney,  MRN: [id number]  PCP: Donato Schultz, DO  Date of Evaluation: 05/01/2023 Time spent:30 minutes  Chief Complaint:  Chief Complaint   Anxiety; Depression; Follow-up; Medication Refill; Patient Education     HISTORY/CURRENT STATUS: HPI  "Amy Mahoney", 73 year old female presents to this office for follow up and medication management.  No changes this visit. Says that she currently feels stable and content.  Just got back from one month trip with friend to the [place] part of [name].  First trip she has been able to take in many years due to care for her father before he passed.  She says her anxiety level today is 2 of 10 and her depression is 2 of 10.  She is sleeping 7 to 8 hours per night.  She follows up with her PCP and other specialist regularly as needed.  She denies any history of mania, no psychosis denies any history of auditory or visual hallucinations.  Denies SI or HI.  Past psychotropic medications: Effexor Klonopin     Individual Medical History/ Review of Systems: Changes? :No   Allergies: Patient has no known allergies.  Current Medications:  Current Outpatient Medications:    levothyroxine (SYNTHROID) 112 MCG tablet, Take 1 tablet (112 mcg total) by mouth daily., Disp: 90 tablet, Rfl: 0   aspirin EC 81 MG tablet, Take 1 tablet (81 mg total) by mouth daily. Swallow whole., Disp: 30 tablet, Rfl: 12   atorvastatin (LIPITOR) 80 MG tablet, TAKE 1 TABLET BY MOUTH DAILY, Disp: 100 tablet, Rfl: 2   Calcium Carbonate-Vitamin D (CALCIUM 500 + D PO), Take 1 tablet by mouth 2 (two) times daily., Disp: , Rfl:    clonazePAM (KLONOPIN) 0.5 MG tablet, Take 1 tablet (0.5 mg total) by mouth daily., Disp: 90 tablet, Rfl: 1   fenofibrate 160 MG tablet, TAKE 1 TABLET BY MOUTH DAILY, Disp: 100 tablet, Rfl: 2   Insulin Pen Needle (BD PEN NEEDLE NANO 2ND GEN) 32G X 4 MM MISC, To inject [place], Disp: 100 each, Rfl: 3   lisinopril  (ZESTRIL) 5 MG tablet, Take 1 tablet (5 mg total) by mouth daily., Disp: 90 tablet, Rfl: 1   Multiple Vitamin (MULTIVITAMIN) tablet, Take 1 tablet by mouth daily., Disp: , Rfl:    omeprazole (PRILOSEC) 40 MG capsule, Take 40 mg by mouth daily., Disp: , Rfl:    ONETOUCH ULTRA test strip, USE AS DIRECTED UP TO 4 TIMES DAILY, Disp: 100 strip, Rfl: 1   tirzepatide (MOUNJARO) 7.5 MG/0.5ML Pen, Inject 7.5 mg into the skin once a week., Disp: 6 mL, Rfl: 3   TRESIBA FLEXTOUCH 100 UNIT/ML FlexTouch Pen, INJECT SUBCUTANEOUSLY 10 UNITS  DAILY, Disp: 15 mL, Rfl: 2   venlafaxine (EFFEXOR) 37.5 MG tablet, Take 1 tablet (37.5 mg total) by mouth daily., Disp: 90 tablet, Rfl: 1   venlafaxine XR (EFFEXOR-XR) 150 MG 24 hr capsule, Take 1 capsule (150 mg total) by mouth daily., Disp: 90 capsule, Rfl: 1   Vitamin D, Ergocalciferol, (DRISDOL) 1.25 MG (50000 UNIT) CAPS capsule, TAKE 1 CAPSULE BY MOUTH EVERY 7  DAYS, Disp: 15 capsule, Rfl: 2 Medication Side Effects: none  Family Medical/ Social History: Changes? No  MENTAL HEALTH EXAM:  There were no vitals taken for this visit.There is no height or weight on file to calculate BMI.  General Appearance: Casual, Neat, and Well Groomed  Eye Contact:  Good  Speech:  Clear and Coherent  Volume:  Normal  Mood:  NA  Affect:  Appropriate  Thought Process:  Coherent  Orientation:  Full (Time, Place, and Person)  Thought Content: Logical   Suicidal Thoughts:  No  Homicidal Thoughts:  No  Memory:  WNL  Judgement:  Good  Insight:  Good  Psychomotor Activity:  Normal  Concentration:  Concentration: Good  Recall:  Good  Fund of Knowledge: Good  Language: Good  Assets:  Desire for Improvement  ADL's:  Intact  Cognition: WNL  Prognosis:  Good    DIAGNOSES:    ICD-10-CM   1. Generalized anxiety disorder  F41.1 clonazePAM (KLONOPIN) 0.5 MG tablet    venlafaxine (EFFEXOR) 37.5 MG tablet    2. Major depressive disorder, recurrent episode, moderate (HCC)  F33.1  clonazePAM (KLONOPIN) 0.5 MG tablet    venlafaxine (EFFEXOR) 37.5 MG tablet    3. Depression, major, single episode, moderate (HCC)  F32.1 venlafaxine XR (EFFEXOR-XR) 150 MG 24 hr capsule      Receiving Psychotherapy: No    RECOMMENDATIONS:   Greater than 50% of 30  min face to face time with patient was spent on counseling and coordination of care. We discussed her current level of stability. Talked about her trip and how it was a great reset after her fathers passing.  She is very happy with her medications right now and does not want to make any changes at this time. Discussed health coping mechanisms. [facility] taking water aerobics.  We agreed to:   To continue Effexor to 187.5 mg daily.  Patient will have to continue 150 mg ER Capsule combined with a 37.5 mg capsule daily. Two separate RX. To continue Klonopin 0.5 mg twice daily for severe anxiety Will report worsening symptoms or side effects promptly Provided emergency contact information Will follow-up in 3 months to reassess Discussed potential benefits, risk, and side effects of benzodiazepines to include potential risk of tolerance and dependence, as well as possible drowsiness.  Advised patient not to drive if experiencing drowsiness and to take lowest possible effective dose to minimize risk of dependence and tolerance.  Reviewed PDMP     Joan Flores, NP

## 2023-05-02 NOTE — Progress Notes (Signed)
 [facility] Summary Report / Loop Recorder

## 2023-05-03 ENCOUNTER — Other Ambulatory Visit: Payer: Self-pay | Admitting: Family Medicine

## 2023-05-03 DIAGNOSIS — E559 Vitamin D deficiency, unspecified: Secondary | ICD-10-CM

## 2023-05-03 DIAGNOSIS — E785 Hyperlipidemia, unspecified: Secondary | ICD-10-CM

## 2023-05-20 ENCOUNTER — Other Ambulatory Visit: Payer: Self-pay | Admitting: Family Medicine

## 2023-05-24 ENCOUNTER — Ambulatory Visit (HOSPITAL_BASED_OUTPATIENT_CLINIC_OR_DEPARTMENT_OTHER): Admitting: Student

## 2023-05-24 ENCOUNTER — Encounter (HOSPITAL_BASED_OUTPATIENT_CLINIC_OR_DEPARTMENT_OTHER): Payer: Self-pay | Admitting: Student

## 2023-05-24 ENCOUNTER — Ambulatory Visit (HOSPITAL_BASED_OUTPATIENT_CLINIC_OR_DEPARTMENT_OTHER)

## 2023-05-24 DIAGNOSIS — M25511 Pain in right shoulder: Secondary | ICD-10-CM

## 2023-05-24 DIAGNOSIS — M19011 Primary osteoarthritis, right shoulder: Secondary | ICD-10-CM | POA: Diagnosis not present

## 2023-05-24 MED ORDER — TRIAMCINOLONE ACETONIDE 40 MG/ML IJ SUSP
2.0000 mL | INTRAMUSCULAR | Status: AC | PRN
  Administered 2023-05-24: 2 mL via INTRA_ARTICULAR

## 2023-05-24 MED ORDER — LIDOCAINE HCL 1 % IJ SOLN
4.0000 mL | INTRAMUSCULAR | Status: AC | PRN
  Administered 2023-05-24: 4 mL

## 2023-05-24 NOTE — Progress Notes (Signed)
 Chief Complaint: Right shoulder pain    Discussed the use of AI scribe software for clinical note transcription with the patient, who gave verbal consent to proceed.  History of Present Illness Amy Mahoney is a 73 year old female who presents with right shoulder pain.  She has been experiencing right shoulder pain for approximately one month. The pain is localized to the anterior and lateral aspects of the shoulder and is exacerbated by most shoulder movements. The onset of her symptoms may be attributed with the physical demands of caring for her late father, who was bedridden for the last eight months of his life. She recalls performing significant physical tasks such as pushing and shoving to assist him, which may have contributed to her shoulder pain.  Denies any specific injury.  She notes some improvement in mobility after participating in water aerobics, which she does four times a week. Her social history includes regular physical activity, such as walking her dog. She is left-hand dominant. Her past medical history includes well-controlled diabetes, with a recent A1c of 6.4.  Surgical History:   None  PMH/PSH/Family History/Social History/Meds/Allergies:    Past Medical History:  Diagnosis Date   Abnormal esophagram    Abscess 04/15/2015   Abscess of skin 04/15/2015   Anxiety    Back pain    Bacterial vaginal infection 12/17/2014   Bronchitis with bronchospasm 04/14/2013   Chest heaviness 07/11/2020   Class 3 severe obesity with serious comorbidity and body mass index (BMI) of 40.0 to 44.9 in adult [facility]) 06/10/2018   Constipation    Cough 09/17/2013   DEPRESSION 09/05/2006   Qualifier: Diagnosis of  By: Janit Bern     Depression with anxiety 07/11/2020   Diabetes mellitus without complication (HCC)    DIZZINESS, CHRONIC 05/23/2006   Qualifier: Diagnosis of  By: Janit Bern     Dysphagia    FATIGUE 06/01/2009   Qualifier:  Diagnosis of  By: Janit Bern     Gallbladder problem    Gastroesophageal reflux disease 05/24/2017   Heart murmur    Hyperlipidemia    Hyperlipidemia associated with type 2 diabetes mellitus (HCC) 07/11/2020   Hyperlipidemia LDL goal <100 11/06/2012   Hypothyroidism 05/23/2006   Qualifier: Diagnosis of  By: Laury Axon DOMyrene Buddy     Lichen planus 12/01/2020   Mild aortic stenosis 11/29/2021   MORBID OBESITY 03/30/2008   Qualifier: Diagnosis of  By: Janit Bern     Muscle spasm of back 11/19/2014   Obstructive sleep apnea 05/23/2006   NPSG 08/03/94 RDI/AHI 49/hr, weight was 285 pounds CPAP/ Advanced    Paronychia of index finger 08/08/2021   Pharyngitis 03/03/2017   Post-nasal drainage 08/03/2019   Last Assessment & Plan:  Formatting of this note might be different from the original. Concern over postnasal drainage. Chronic at least a year history of the above but over the last month seems to have worsened.  She has tried antihistamine therapy without improvement.  Denies obvious heartburn.  Some associated cough and occasional near emesis episodes.  She is not on an ACE inhibitor. 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   Subungual hematoma of fifth toe  of right foot 10/29/2017   Swelling    Type 2 diabetes mellitus (HCC) 09/02/2020   Type 2 diabetes mellitus with hyperglycemia (HCC) 06/10/2018   Past Surgical History:  Procedure Laterality Date   CHOLECYSTECTOMY     DILATION AND CURETTAGE OF UTERUS     ESOPHAGOGASTRODUODENOSCOPY (EGD) WITH PROPOFOL N/A 09/05/2017   Procedure: ESOPHAGOGASTRODUODENOSCOPY (EGD) WITH PROPOFOL;  Surgeon: Beverley Fiedler, MD;  Location: [facility];  Service: Gastroenterology;  Laterality: N/A;   fibroids scrapping     LOOP RECORDER INSERTION N/A 06/20/2022   Procedure: LOOP RECORDER INSERTION;  Surgeon: Regan Lemming, MD;  Location:  [facility];  Service: Cardiovascular;  Laterality: N/A;   TONSILLECTOMY     Social History   Socioeconomic History   Marital status: Divorced    Spouse name: Not on file   Number of children: 0   Years of education: Not on file   Highest education level: Bachelor's degree (e.g., BA, AB, BS)  Occupational History   Occupation: Eps Midwife: [employer]    Employer: [employer]   Occupation: RETIRED  Tobacco Use   Smoking status: Former    Current packs/day: 0.00    Average packs/day: 0.5 packs/day for 3.0 years (1.5 ttl pk-yrs)    Types: Cigarettes    Start date: 08/10/1987    Quit date: 08/10/1990    Years since quitting: 32.8   Smokeless tobacco: Never  Vaping Use   Vaping status: Never Used  Substance and Sexual Activity   Alcohol use: Not Currently    Comment: rarely   Drug use: No   Sexual activity: Not Currently    Partners: Male  Other Topics Concern   Not on file  Social History Narrative   Regular exercise - yes-- 10,000 steps   Caffeine use: 2 cups of coffee daily   Live  in [place] with 31 year old father to assist in care. Will be moving in with brother and his wife into larger home to facilitate better care.    Social Drivers of Corporate investment banker Strain: Low Risk  (03/10/2023)   Overall Financial Resource Strain (CARDIA)    Difficulty of Paying Living Expenses: Not hard at all  Food Insecurity: No Food Insecurity (03/10/2023)   Hunger Vital Sign    Worried About Running Out of Food in the Last Year: Never true    Ran Out of Food in the Last Year: Never true  Transportation Needs: No Transportation Needs (03/10/2023)   PRAPARE - Administrator, Civil Service (Medical): No    Lack of Transportation (Non-Medical): No  Physical Activity: Sufficiently Active (03/10/2023)   Exercise Vital Sign    Days of Exercise per Week: 4 days    Minutes of Exercise per Session: 60 min  Stress: No Stress Concern  Present (03/10/2023)   [employer] of Occupational Health - Occupational Stress Questionnaire    Feeling of Stress : Only a little  Social Connections: Moderately Integrated (03/10/2023)   Social Connection and Isolation Panel [NHANES]    Frequency of Communication with Friends and Family: More than three times a week    Frequency of Social Gatherings with Friends and Family: Twice a week    Attends Religious Services: More than 4 times per year    Active Member of [employer] or Organizations: Yes    Attends Engineer, structural: More than 4 times per year    Marital Status: Divorced  Family History  Problem Relation Age of Onset   Diabetes Mother    Thyroid disease Mother    Depression Mother    Anxiety disorder Brother    Diabetes Maternal Aunt    Lung cancer Maternal Aunt        Nonsmoker   Cancer Maternal Aunt        breast   Breast cancer Maternal Aunt    Cancer Maternal Aunt        lung(nonsmoker)   Breast cancer Paternal Grandmother    Depression Other    Hyperlipidemia Other    Arthritis Other    Colon cancer Neg Hx    Pancreatic cancer Neg Hx    Stomach cancer Neg Hx    Esophageal cancer Neg Hx    Rectal cancer Neg Hx    Stroke Neg Hx    No Known Allergies Current Outpatient Medications  Medication Sig Dispense Refill   aspirin EC 81 MG tablet Take 1 tablet (81 mg total) by mouth daily. Swallow whole. 30 tablet 12   atorvastatin (LIPITOR) 80 MG tablet TAKE 1 TABLET BY MOUTH DAILY 100 tablet 2   Calcium Carbonate-Vitamin D (CALCIUM 500 + D PO) Take 1 tablet by mouth 2 (two) times daily.     clonazePAM (KLONOPIN) 0.5 MG tablet Take 1 tablet (0.5 mg total) by mouth daily. 90 tablet 1   fenofibrate 160 MG tablet TAKE 1 TABLET BY MOUTH DAILY 100 tablet 2   Insulin Pen Needle (BD PEN NEEDLE NANO 2ND GEN) 32G X 4 MM MISC To inject [place] 100 each 3   levothyroxine (SYNTHROID) 112 MCG tablet TAKE 1 TABLET BY MOUTH DAILY 90 tablet 1   lisinopril (ZESTRIL) 5 MG  tablet Take 1 tablet (5 mg total) by mouth daily. 90 tablet 1   Multiple Vitamin (MULTIVITAMIN) tablet Take 1 tablet by mouth daily.     omeprazole (PRILOSEC) 40 MG capsule Take 40 mg by mouth daily.     ONETOUCH ULTRA test strip USE AS DIRECTED UP TO 4 TIMES DAILY 100 strip 1   tirzepatide (MOUNJARO) 7.5 MG/0.5ML Pen Inject 7.5 mg into the skin once a week. 6 mL 3   TRESIBA FLEXTOUCH 100 UNIT/ML FlexTouch Pen INJECT SUBCUTANEOUSLY 10 UNITS  DAILY 15 mL 2   venlafaxine (EFFEXOR) 37.5 MG tablet Take 1 tablet (37.5 mg total) by mouth daily. 90 tablet 1   venlafaxine XR (EFFEXOR-XR) 150 MG 24 hr capsule Take 1 capsule (150 mg total) by mouth daily. 90 capsule 1   Vitamin D, Ergocalciferol, (DRISDOL) 1.25 MG (50000 UNIT) CAPS capsule TAKE 1 CAPSULE BY MOUTH EVERY 7  DAYS 15 capsule 2   No current facility-administered medications for this visit.   No results found.  Review of Systems:   A ROS was performed including pertinent positives and negatives as documented in the HPI.  Physical Exam :   Constitutional: NAD and appears stated age Neurological: Alert and oriented Psych: Appropriate affect and cooperative There were no vitals taken for this visit.   Comprehensive Musculoskeletal Exam:    Tenderness with palpation in the anterior and posterior glenohumeral joint as well as the lateral deltoid.  Active range of motion to 120 degrees forward flexion, 30 degrees external rotation, and internal rotation to back pocket.  Positive Neer and Hawkins.  Imaging:   Xray (left shoulder 3 views): Moderate glenohumeral and acromial clavicular joint osteoarthritis.  No acute fracture or dislocation.   I personally reviewed and interpreted the radiographs.  Assessment & Plan Right Shoulder Arthritis   Moderate arthritis with bone spur and joint space narrowing confirmed by x-ray. Pain occurs with motion and worsens after activities.  She has positive impingement signs although arthritis is  likely the primary cause of pain.  A cortisone injection is offered today for pain relief which patient is agreeable to.  Administered cortisone injection in the right shoulder using ultrasound guidance without complication. Encourage participation in water aerobics and regular movement. Monitor response to the injection and reassess if symptoms persist.     Procedure Note  Patient: Amy Mahoney             Date of Birth: 27-Jul-1950           MRN: [id number]             Visit Date: 05/24/2023  Procedures: Visit Diagnoses:  1. Primary osteoarthritis, right shoulder     Large Joint Inj: R glenohumeral on 05/24/2023 5:03 PM Indications: pain Details: 22 G 1.5 in needle, anterior approach Medications: 4 mL lidocaine 1 %; 2 mL triamcinolone acetonide 40 MG/ML Outcome: tolerated well, no immediate complications Procedure, treatment alternatives, risks and benefits explained, specific risks discussed. Consent was given by the patient. Immediately prior to procedure a time out was called to verify the correct patient, procedure, equipment, support staff and site/side marked as required. Patient was prepped and draped in the usual sterile fashion.      I personally saw and evaluated the patient, and participated in the management and treatment plan.  Hazle Nordmann, PA-C Orthopedics

## 2023-05-24 NOTE — Progress Notes (Signed)
 I agree with the above plan

## 2023-05-30 ENCOUNTER — Other Ambulatory Visit: Payer: Self-pay | Admitting: Family Medicine

## 2023-06-03 ENCOUNTER — Ambulatory Visit: Payer: Medicare Other

## 2023-06-03 DIAGNOSIS — I639 Cerebral infarction, unspecified: Secondary | ICD-10-CM

## 2023-06-03 LAB — CUP PACEART REMOTE DEVICE CHECK
Date Time Interrogation Session: 20250413231819
Implantable Pulse Generator Implant Date: 20240501

## 2023-06-05 ENCOUNTER — Encounter (HOSPITAL_BASED_OUTPATIENT_CLINIC_OR_DEPARTMENT_OTHER): Payer: Self-pay

## 2023-06-18 ENCOUNTER — Other Ambulatory Visit: Payer: Self-pay | Admitting: Family Medicine

## 2023-06-18 DIAGNOSIS — E1165 Type 2 diabetes mellitus with hyperglycemia: Secondary | ICD-10-CM

## 2023-06-18 NOTE — Progress Notes (Signed)
 [facility] Summary Report / Loop Recorder

## 2023-07-06 ENCOUNTER — Other Ambulatory Visit: Payer: Self-pay | Admitting: Family Medicine

## 2023-07-06 DIAGNOSIS — E1169 Type 2 diabetes mellitus with other specified complication: Secondary | ICD-10-CM

## 2023-07-08 ENCOUNTER — Ambulatory Visit (INDEPENDENT_AMBULATORY_CARE_PROVIDER_SITE_OTHER): Payer: Medicare Other

## 2023-07-08 DIAGNOSIS — I639 Cerebral infarction, unspecified: Secondary | ICD-10-CM

## 2023-07-09 LAB — CUP PACEART REMOTE DEVICE CHECK
Date Time Interrogation Session: 20250518233036
Implantable Pulse Generator Implant Date: 20240501

## 2023-07-10 ENCOUNTER — Ambulatory Visit: Payer: Self-pay | Admitting: Cardiology

## 2023-07-16 ENCOUNTER — Ambulatory Visit (INDEPENDENT_AMBULATORY_CARE_PROVIDER_SITE_OTHER)

## 2023-07-16 VITALS — Ht 65.5 in | Wt 240.0 lb

## 2023-07-16 DIAGNOSIS — Z0000 Encounter for general adult medical examination without abnormal findings: Secondary | ICD-10-CM | POA: Diagnosis not present

## 2023-07-16 DIAGNOSIS — Z1231 Encounter for screening mammogram for malignant neoplasm of breast: Secondary | ICD-10-CM

## 2023-07-16 NOTE — Progress Notes (Signed)
 Subjective:   Amy Mahoney is a 73 y.o. who presents for a Medicare Wellness preventive visit.  As a reminder, Annual Wellness Visits don't include a physical exam, and some assessments may be limited, especially if this visit is performed virtually. We may recommend an in-person follow-up visit with your provider if needed.  Visit Complete: Virtual I connected with  Amy Mahoney on 07/16/23 by a audio enabled telemedicine application and verified that I am speaking with the correct person using two identifiers.  Patient Location: Home  Provider Location: Home Office  I discussed the limitations of evaluation and management by telemedicine. The patient expressed understanding and agreed to proceed.  Vital Signs: Because this visit was a virtual/telehealth visit, some criteria may be missing or patient reported. Any vitals not documented were not able to be obtained and vitals that have been documented are patient reported.  VideoDeclined- This patient declined Librarian, academic. Therefore the visit was completed with audio only.  Persons Participating in Visit: Patient.  AWV Questionnaire: Yes: Patient Medicare AWV questionnaire was completed by the patient on 07/16/23; I have confirmed that all information answered by patient is correct and no changes since this date.  Cardiac Risk Factors include: advanced age (>59men, >75 women);diabetes mellitus;dyslipidemia;hypertension     Objective:     Today's Vitals   07/16/23 1623  Weight: 240 lb (108.9 kg)  Height: 5' 5.5" (1.664 m)   Body mass index is 39.33 kg/m.     07/16/2023    4:28 PM 06/19/2022    9:57 AM 05/22/2022    9:48 AM 08/12/2021   10:56 AM 07/31/2021    8:03 AM 05/18/2021   11:59 AM 09/05/2017    8:04 AM  Advanced Directives  Does Patient Have a Medical Advance Directive? No No Yes No No Yes Yes  Type of Surveyor, minerals;Living will   Healthcare Power of  [place];Living will Healthcare Power of [place];Living will  Does patient want to make changes to medical advance directive?   No - Patient declined      Copy of Healthcare Power of Attorney in Chart?   No - copy requested   No - copy requested No - copy requested  Would patient like information on creating a medical advance directive? Yes (MAU/Ambulatory/Procedural Areas - Information given) No - Patient declined   No - Patient declined      Current Medications (verified) Outpatient Encounter Medications as of 07/16/2023  Medication Sig   aspirin  EC 81 MG tablet Take 1 tablet (81 mg total) by mouth daily. Swallow whole.   atorvastatin  (LIPITOR) 80 MG tablet TAKE 1 TABLET BY MOUTH ONCE  DAILY   Calcium  Carbonate-Vitamin D  (CALCIUM  500 + D PO) Take 1 tablet by mouth 2 (two) times daily.   clonazePAM  (KLONOPIN ) 0.5 MG tablet Take 1 tablet (0.5 mg total) by mouth daily.   fenofibrate  160 MG tablet TAKE 1 TABLET BY MOUTH DAILY   insulin  degludec (TRESIBA  FLEXTOUCH) 100 UNIT/ML FlexTouch Pen Inject 10 Units into the skin daily.   Insulin  Pen Needle (BD PEN NEEDLE NANO 2ND GEN) 32G X 4 MM MISC To inject Tresiba    levothyroxine  (SYNTHROID ) 112 MCG tablet TAKE 1 TABLET BY MOUTH DAILY   lisinopril  (ZESTRIL ) 5 MG tablet TAKE 1 TABLET BY MOUTH DAILY   Multiple Vitamin (MULTIVITAMIN) tablet Take 1 tablet by mouth daily.   omeprazole  (PRILOSEC) 40 MG capsule Take 40 mg by mouth daily.  ONETOUCH ULTRA test strip USE AS DIRECTED UP TO 4 TIMES DAILY   tirzepatide  (MOUNJARO ) 7.5 MG/0.5ML Pen Inject 7.5 mg into the skin once a week.   venlafaxine  (EFFEXOR ) 37.5 MG tablet Take 1 tablet (37.5 mg total) by mouth daily.   venlafaxine  XR (EFFEXOR -XR) 150 MG 24 hr capsule Take 1 capsule (150 mg total) by mouth daily.   Vitamin D , Ergocalciferol , (DRISDOL ) 1.25 MG (50000 UNIT) CAPS capsule TAKE 1 CAPSULE BY MOUTH EVERY 7  DAYS   No facility-administered encounter medications on file as of 07/16/2023.     Allergies (verified) Patient has no known allergies.   History: Past Medical History:  Diagnosis Date   Abnormal esophagram    Abscess 04/15/2015   Abscess of skin 04/15/2015   Anxiety    Arthritis    Back pain    Bacterial vaginal infection 12/17/2014   Bronchitis with bronchospasm 04/14/2013   Chest heaviness 07/11/2020   Class 3 severe obesity with serious comorbidity and body mass index (BMI) of 40.0 to 44.9 in adult 06/10/2018   Constipation    Cough 09/17/2013   DEPRESSION 09/05/2006   Qualifier: Diagnosis of  By: Tracy Friedlander     Depression with anxiety 07/11/2020   Diabetes mellitus without complication (HCC)    DIZZINESS, CHRONIC 05/23/2006   Qualifier: Diagnosis of  By: Tracy Friedlander     Dysphagia    FATIGUE 06/01/2009   Qualifier: Diagnosis of  By: Tracy Friedlander     Gallbladder problem    Gastroesophageal reflux disease 05/24/2017   Heart murmur    Hyperlipidemia    Hyperlipidemia associated with type 2 diabetes mellitus (HCC) 07/11/2020   Hyperlipidemia LDL goal <100 11/06/2012   Hypothyroidism 05/23/2006   Qualifier: Diagnosis of  By: Jalene Mayor DOAdel Holt     Lichen planus 12/01/2020   Mild aortic stenosis 11/29/2021   MORBID OBESITY 03/30/2008   Qualifier: Diagnosis of  By: Tracy Friedlander     Muscle spasm of back 11/19/2014   Obstructive sleep apnea 05/23/2006   NPSG 08/03/94 RDI/AHI 49/hr, weight was 285 pounds CPAP/ Advanced    Paronychia of index finger 08/08/2021   Pharyngitis 03/03/2017   Post-nasal drainage 08/03/2019   Last Assessment & Plan:  Formatting of this note might be different from the original. Concern over postnasal drainage. Chronic at least a year history of the above but over the last month seems to have worsened.  She has tried antihistamine therapy without improvement.  Denies obvious heartburn.  Some associated cough and occasional near emesis episodes.  She is not on an ACE inhibitor. 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   Subungual hematoma of fifth toe of right foot 10/29/2017   Swelling    Type 2 diabetes mellitus (HCC) 09/02/2020   Type 2 diabetes mellitus with hyperglycemia (HCC) 06/10/2018   Past Surgical History:  Procedure Laterality Date   CHOLECYSTECTOMY     DILATION AND CURETTAGE OF UTERUS     ESOPHAGOGASTRODUODENOSCOPY (EGD) WITH PROPOFOL  N/A 09/05/2017   Procedure: ESOPHAGOGASTRODUODENOSCOPY (EGD) WITH PROPOFOL ;  Surgeon: Nannette Babe, MD;  Location: [facility];  Service: Gastroenterology;  Laterality: N/A;   fibroids scrapping     LOOP RECORDER INSERTION N/A 06/20/2022   Procedure: LOOP RECORDER INSERTION;  Surgeon: Lei Pump, MD;  Location: [facility];  Service: Cardiovascular;  Laterality: N/A;   TONSILLECTOMY     Family History  Problem Relation Age of Onset   Diabetes Mother    Thyroid  disease Mother    Depression Mother    Arthritis Mother    Hearing loss Father    Anxiety disorder Brother    Diabetes Maternal Aunt    Lung cancer Maternal Aunt        Nonsmoker   Cancer Maternal Aunt        breast   Breast cancer Maternal Aunt    Cancer Maternal Aunt        lung(nonsmoker)   Breast cancer Paternal Grandmother    Depression Other    Hyperlipidemia Other    Arthritis Other    Colon cancer Neg Hx    Pancreatic cancer Neg Hx    Stomach cancer Neg Hx    Esophageal cancer Neg Hx    Rectal cancer Neg Hx    Stroke Neg Hx    Social History   Socioeconomic History   Marital status: Divorced    Spouse name: Not on file   Number of children: 0   Years of education: Not on file   Highest education level: Bachelor's degree (e.g., BA, AB, BS)  Occupational History   Occupation: Eps Midwife: [employer]    Employer: [employer]   Occupation: RETIRED  Tobacco Use   Smoking status: Former     Current packs/day: 0.00    Average packs/day: 0.5 packs/day for 3.0 years (1.5 ttl pk-yrs)    Types: Cigarettes    Start date: 08/10/1987    Quit date: 08/10/1990    Years since quitting: 32.9   Smokeless tobacco: Never  Vaping Use   Vaping status: Never Used  Substance and Sexual Activity   Alcohol use: Not Currently    Comment: rarely   Drug use: No   Sexual activity: Not Currently    Partners: Male    Birth control/protection: Abstinence    Comment: too old  Other Topics Concern   Not on file  Social History Narrative   Regular exercise - yes-- 10,000 steps   Caffeine use: 2 cups of coffee daily   Live  in [place] with 81 year old father to assist in care. Will be moving in with brother and his wife into larger home to facilitate better care.    Social Drivers of Corporate investment banker Strain: Low Risk  (07/16/2023)   Overall Financial Resource Strain (CARDIA)    Difficulty of Paying Living Expenses: Not hard at all  Food Insecurity: No Food Insecurity (07/16/2023)   Hunger Vital Sign    Worried About Running Out of Food in the Last Year: Never true    Ran Out of Food in the Last Year: Never true  Transportation Needs: No Transportation Needs (07/16/2023)   PRAPARE - Administrator, Civil Service (Medical): No    Lack of Transportation (Non-Medical): No  Physical Activity: Sufficiently Active (07/16/2023)   Exercise Vital Sign    Days of Exercise per Week: 4 days    Minutes of Exercise per Session: 60 min  Stress: No Stress Concern Present (07/16/2023)   [employer] of Occupational Health - Occupational Stress Questionnaire    Feeling of Stress : Not at all  Social Connections: Moderately Integrated (07/16/2023)   Social Connection and Isolation Panel [NHANES]    Frequency of Communication with Friends and Family: More than three times  a week    Frequency of Social Gatherings with Friends and Family: Twice a week    Attends Religious Services: More  than 4 times per year    Active Member of [employer] or Organizations: Yes    Attends Engineer, structural: More than 4 times per year    Marital Status: Divorced    Tobacco Counseling Counseling given: Not Answered    Clinical Intake:  Pre-visit preparation completed: Yes  Pain : No/denies pain     Diabetes: No  Lab Results  Component Value Date   HGBA1C 6.1 03/12/2023   HGBA1C 6.3 05/28/2022   HGBA1C 7.2 (H) 02/02/2022     How often do you need to have someone help you when you read instructions, pamphlets, or other written materials from your doctor or pharmacy?: 1 - Never  Interpreter Needed?: No  Information entered by :: Amy Cypress LPN   Activities of Daily Living     07/16/2023    7:42 AM  In your present state of health, do you have any difficulty performing the following activities:  Hearing? 0  Vision? 0  Difficulty concentrating or making decisions? 0  Walking or climbing stairs? 0  Dressing or bathing? 0  Doing errands, shopping? 0  Preparing Food and eating ? N  Using the Toilet? N  In the past six months, have you accidently leaked urine? N  Do you have problems with loss of bowel control? N  Managing your Medications? N  Managing your Finances? N  Housekeeping or managing your Housekeeping? N    Patient Care Team: Crecencio Dodge, Candida Chalk, DO as PCP - General (Family Medicine) Faustina Hood, MD as Consulting Physician (Pulmonary Disease) Dermatology, [facility], Amber Bail, MD as Consulting Physician (Gastroenterology) Cecilie Coffee, RPH-CPP (Pharmacist) Pa, [facility]  Indicate any recent Medical Services you may have received from other than [facility] providers in the past year (date may be approximate).     Assessment:    This is a routine wellness examination for Amy Mahoney.  Hearing/Vision screen Hearing Screening - Comments:: Denies hearing difficulties   Vision Screening - Comments:: Wears rx  glasses - up to date with routine eye exams with [facility]    Goals Addressed             This Visit's Progress    Exercise 3x per week (30 min per time)   On track    Continue to exercise and healthy.  Travel to see family in [place].       Depression Screen     07/16/2023    4:27 PM 05/22/2022    9:50 AM 01/02/2022    6:19 PM 09/28/2021    8:53 AM 08/08/2021    6:05 PM 08/08/2021    6:04 PM 05/18/2021   11:56 AM  PHQ 2/9 Scores  PHQ - 2 Score 0 0 0  0 0 0  PHQ- 9 Score   0  0       Information is confidential and restricted. Go to Review Flowsheets to unlock data.    Fall Risk     07/16/2023    7:42 AM 05/22/2022    7:49 AM 05/21/2022    7:46 AM 02/02/2022   11:07 AM 05/18/2021   12:00 PM  Fall Risk   Falls in the past year? 1 0 0 0 0  Number falls in past yr: 0 0  0 0  Injury with Fall? 0 0 0 0  0  Risk for fall due to : History of fall(s) History of fall(s)   No Fall Risks  Follow up Falls prevention discussed;Education provided;Falls evaluation completed Falls evaluation completed  Falls evaluation completed Falls prevention discussed    MEDICARE RISK AT HOME:  Medicare Risk at Home Any stairs in or around the home?: (Patient-Rptd) No If so, are there any without handrails?: (Patient-Rptd) No Home free of loose throw rugs in walkways, pet beds, electrical cords, etc?: (Patient-Rptd) Yes Adequate lighting in your home to reduce risk of falls?: (Patient-Rptd) Yes Life alert?: (Patient-Rptd) No Use of a cane, walker or w/c?: (Patient-Rptd) No Grab bars in the bathroom?: (Patient-Rptd) Yes Shower chair or bench in shower?: (Patient-Rptd) No Elevated toilet seat or a handicapped toilet?: (Patient-Rptd) Yes  TIMED UP AND GO:  Was the test performed?  No  Cognitive Function: 6CIT completed        07/16/2023    4:28 PM 05/22/2022    9:55 AM 05/18/2021   12:04 PM  6CIT Screen  What Year? 0 points 0 points 0 points  What month? 0 points 0 points 0 points   What time? 0 points 0 points 0 points  Count back from 20 0 points 0 points 0 points  Months in reverse 0 points 0 points 0 points  Repeat phrase 0 points 0 points 0 points  Total Score 0 points 0 points 0 points    Immunizations Immunization History  Administered Date(s) Administered   Fluad Quad(high Dose 65+) 11/14/2018, 12/21/2019, 01/10/2021, 01/02/2022   Influenza Split 11/19/2012   Influenza, High Dose Seasonal PF 12/14/2016, 10/29/2017   Influenza,inj,Quad PF,6+ Mos 11/20/2015   Influenza-Unspecified 12/16/2013   PNEUMOCOCCAL CONJUGATE-20 01/10/2021   Pneumococcal Conjugate-13 11/19/2014   Pneumococcal Polysaccharide-23 10/30/2013   Td 08/08/2021   Tdap 08/01/2010   Zoster Recombinant(Shingrix) 12/23/2017, 04/21/2018   Zoster, Live 08/01/2010    Screening Tests Health Maintenance  Topic Date Due   FOOT EXAM  07/11/2021   COVID-19 Vaccine (1 - 2024-25 season) Never done   MAMMOGRAM  05/28/2023   HEMOGLOBIN A1C  09/09/2023   INFLUENZA VACCINE  09/20/2023   OPHTHALMOLOGY EXAM  10/23/2023   Diabetic kidney evaluation - eGFR measurement  03/11/2024   Diabetic kidney evaluation - Urine ACR  03/11/2024   Medicare Annual Wellness (AWV)  07/15/2024   Colonoscopy  01/22/2028   DTaP/Tdap/Td (3 - Td or Tdap) 08/09/2031   Pneumonia Vaccine 50+ Years old  Completed   DEXA SCAN  Completed   Hepatitis C Screening  Completed   Zoster Vaccines- Shingrix  Completed   HPV VACCINES  Aged Out   Meningococcal B Vaccine  Aged Out    Health Maintenance  Health Maintenance Due  Topic Date Due   FOOT EXAM  07/11/2021   COVID-19 Vaccine (1 - 2024-25 season) Never done   MAMMOGRAM  05/28/2023   Health Maintenance Items Addressed: Mammogram ordered  Additional Screening:  Vision Screening: Recommended annual ophthalmology exams for early detection of glaucoma and other disorders of the eye.  Dental Screening: Recommended annual dental exams for proper oral  hygiene  Community Resource Referral / Chronic Care Management: CRR required this visit?  No   CCM required this visit?  No   Plan:    I have personally reviewed and noted the following in the patient's chart:   Medical and social history Use of alcohol, tobacco or illicit drugs  Current medications and supplements including opioid prescriptions. Patient is not currently taking opioid prescriptions.  Functional ability and status Nutritional status Physical activity Advanced directives List of other physicians Hospitalizations, surgeries, and ER visits in previous 12 months Vitals Screenings to include cognitive, depression, and falls Referrals and appointments  In addition, I have reviewed and discussed with patient certain preventive protocols, quality metrics, and best practice recommendations. A written personalized care plan for preventive services as well as general preventive health recommendations were provided to patient.   Amy Mahoney, [place]   1/61/0960   After Visit Summary: (MyChart) Due to this being a telephonic visit, the after visit summary with patients personalized plan was offered to patient via MyChart   Notes: Nothing significant to report at this time.

## 2023-07-16 NOTE — Patient Instructions (Signed)
 [name] , Thank you for taking time out of your busy schedule to complete your Annual Wellness Visit with me. I enjoyed our conversation and look forward to speaking with you again next year. I, as well as your care team,  appreciate your ongoing commitment to your health goals. Please review the following plan we discussed and let me know if I can assist you in the future. Your Game plan/ To Do List    Referrals:  You have an order for:  []   2D Mammogram  [x]   3D Mammogram  []   Bone Density     Please call for appointment:   [facility] [address] [place] Armor [place], [place] [address] [phone]   Make sure to wear two-piece clothing.  No lotions, powders, or deodorants the day of the appointment. Make sure to bring picture ID and insurance card.  Bring list of medications you are currently taking including any supplements.   Follow up Visits: Next Medicare AWV with our clinical staff: In 1 year   Have you seen your provider in the last 6 months (3 months if uncontrolled diabetes)? Yes Next Office Visit with your provider: To be scheduled   Clinician Recommendations:  Aim for 30 minutes of exercise or brisk walking, 6-8 glasses of water, and 5 servings of fruits and vegetables each day.       This is a list of the screening recommended for you and due dates:  Health Maintenance  Topic Date Due   Complete foot exam   07/11/2021   COVID-19 Vaccine (1 - 2024-25 season) Never done   Mammogram  05/28/2023   Hemoglobin A1C  09/09/2023   Flu Shot  09/20/2023   Eye exam for diabetics  10/23/2023   Yearly kidney function blood test for diabetes  03/11/2024   Yearly kidney health urinalysis for diabetes  03/11/2024   Medicare Annual Wellness Visit  07/15/2024   Colon Cancer Screening  01/22/2028   DTaP/Tdap/Td vaccine (3 - Td or Tdap) 08/09/2031   Pneumonia Vaccine  Completed   DEXA scan (bone density measurement)  Completed   Hepatitis C  Screening  Completed   Zoster (Shingles) Vaccine  Completed   HPV Vaccine  Aged Out   Meningitis B Vaccine  Aged Out    Advanced directives: (ACP Link)Information on Advanced Care Planning can be found at [place]  Secretary of [facility] Advance Health Care Directives Advance Health Care Directives. [email or web address]   Advance Care Planning is important because it:  [x]  Makes sure you receive the medical care that is consistent with your values, goals, and preferences  [x]  It provides guidance to your family and loved ones and reduces their decisional burden about whether or not they are making the right decisions based on your wishes.  Follow the link provided in your after visit summary or read over the paperwork we have mailed to you to help you started getting your Advance Directives in place. If you need assistance in completing these, please reach out to us  so that we can help you!  See attachments for Preventive Care and Fall Prevention Tips.

## 2023-07-25 NOTE — Progress Notes (Signed)
 [facility] Summary Report / Loop Recorder

## 2023-07-31 ENCOUNTER — Ambulatory Visit: Admitting: Podiatry

## 2023-07-31 ENCOUNTER — Encounter: Payer: Self-pay | Admitting: Podiatry

## 2023-07-31 ENCOUNTER — Ambulatory Visit (INDEPENDENT_AMBULATORY_CARE_PROVIDER_SITE_OTHER)

## 2023-07-31 DIAGNOSIS — M7750 Other enthesopathy of unspecified foot: Secondary | ICD-10-CM

## 2023-07-31 DIAGNOSIS — M2042 Other hammer toe(s) (acquired), left foot: Secondary | ICD-10-CM | POA: Diagnosis not present

## 2023-07-31 DIAGNOSIS — M2041 Other hammer toe(s) (acquired), right foot: Secondary | ICD-10-CM

## 2023-07-31 DIAGNOSIS — M7752 Other enthesopathy of left foot: Secondary | ICD-10-CM

## 2023-07-31 MED ORDER — TRIAMCINOLONE ACETONIDE 10 MG/ML IJ SUSP
10.0000 mg | Freq: Once | INTRAMUSCULAR | Status: AC
  Administered 2023-07-31: 10 mg via INTRA_ARTICULAR

## 2023-07-31 NOTE — Progress Notes (Signed)
 Subjective:   Patient ID: Amy Mahoney, female   DOB: 73 y.o.   MRN: [id number]   HPI Patient presents stating she has developed a lot of pain on top of her left foot again it was doing great but she did extensive walking a couple weeks ago and it started to get very sore.  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  For the tendinitis I do think it is related to the activity levels and the excessive walking and I want to continue conservative treatment and I did careful injection around the anterior tib and medial 3 mg Dexasone Kenalog  5 mg Xylocaine  advised on reduced activity and did first discussed chances for tendon rupture.  For the hammertoes we discussed the rigid nature we discussed the types of shoe gear she wears and to stay in wide toe boxes with mesh material.  Ultimately may require digital straightening and I explained procedure to her but organ to try to hold off on that  X-rays left indicated that there is rigid contracture of the lesser digits that is quite significant at the MPJ and inner phalangeal joint

## 2023-08-08 ENCOUNTER — Ambulatory Visit (INDEPENDENT_AMBULATORY_CARE_PROVIDER_SITE_OTHER): Payer: Self-pay

## 2023-08-08 ENCOUNTER — Other Ambulatory Visit: Payer: Self-pay | Admitting: Family Medicine

## 2023-08-08 ENCOUNTER — Ambulatory Visit: Payer: Self-pay | Admitting: Cardiology

## 2023-08-08 DIAGNOSIS — I639 Cerebral infarction, unspecified: Secondary | ICD-10-CM | POA: Diagnosis not present

## 2023-08-08 LAB — CUP PACEART REMOTE DEVICE CHECK
Date Time Interrogation Session: 20250618232525
Implantable Pulse Generator Implant Date: 20240501

## 2023-08-26 ENCOUNTER — Telehealth (HOSPITAL_BASED_OUTPATIENT_CLINIC_OR_DEPARTMENT_OTHER): Payer: Self-pay

## 2023-08-30 ENCOUNTER — Encounter: Payer: Self-pay | Admitting: Behavioral Health

## 2023-08-30 ENCOUNTER — Ambulatory Visit: Admitting: Behavioral Health

## 2023-08-30 DIAGNOSIS — F411 Generalized anxiety disorder: Secondary | ICD-10-CM

## 2023-08-30 DIAGNOSIS — F321 Major depressive disorder, single episode, moderate: Secondary | ICD-10-CM | POA: Diagnosis not present

## 2023-08-30 DIAGNOSIS — F331 Major depressive disorder, recurrent, moderate: Secondary | ICD-10-CM

## 2023-08-30 MED ORDER — VENLAFAXINE HCL 37.5 MG PO TABS: 37.5000 mg | ORAL_TABLET | Freq: Every day | ORAL | 1 refills | Status: DC

## 2023-08-30 MED ORDER — CLONAZEPAM 0.5 MG PO TABS: 0.5000 mg | ORAL_TABLET | Freq: Every day | ORAL | 1 refills | Status: DC

## 2023-08-30 MED ORDER — VENLAFAXINE HCL ER 150 MG PO CP24: 150.0000 mg | ORAL_CAPSULE | Freq: Every day | ORAL | 1 refills | Status: DC

## 2023-08-30 NOTE — Progress Notes (Signed)
 [facility] Summary Report / Loop Recorder

## 2023-08-30 NOTE — Progress Notes (Signed)
 [facility]  Patient ID: Amy Mahoney,  MRN: [id number]  PCP: Antonio Cyndee Jamee JONELLE, DO  Date of Evaluation: 08/30/2023 Time spent:30 minutes  Chief Complaint:   HISTORY/CURRENT STATUS: HPI Amy Mahoney, 73 year old female presents to this office for follow up and medication management.  No changes this visit. Says that she currently feels stable and content. Is doing well overall and coping effectively post fathers passing.  Strong support network and close to brother. She is is traveling much as possible and enjoys activities. She says her anxiety level today is 2 of 10 and her depression is 2 of 10.  She is sleeping 7 to 8 hours per night.  She follows up with her PCP and other specialist regularly as needed.  She denies any history of mania, no psychosis denies any history of auditory or visual hallucinations.  Denies SI or HI.  Past psychotropic medications: Effexor  Klonopin       Individual Medical History/ Review of Systems: Changes? :No   Allergies: Patient has no known allergies.  Current Medications:  Current Outpatient Medications:    aspirin  EC 81 MG tablet, Take 1 tablet (81 mg total) by mouth daily. Swallow whole., Disp: 30 tablet, Rfl: 12   atorvastatin  (LIPITOR) 80 MG tablet, TAKE 1 TABLET BY MOUTH ONCE  DAILY, Disp: 100 tablet, Rfl: 2   Calcium  Carbonate-Vitamin D  (CALCIUM  500 + D PO), Take 1 tablet by mouth 2 (two) times daily., Disp: , Rfl:    clonazePAM  (KLONOPIN ) 0.5 MG tablet, Take 1 tablet (0.5 mg total) by mouth daily., Disp: 90 tablet, Rfl: 1   fenofibrate  160 MG tablet, TAKE 1 TABLET BY MOUTH DAILY, Disp: 100 tablet, Rfl: 2   insulin  degludec (TRESIBA  FLEXTOUCH) 100 UNIT/ML FlexTouch Pen, Inject 10 Units into the skin daily., Disp: 15 mL, Rfl: 0   Insulin  Pen Needle (BD PEN NEEDLE NANO 2ND GEN) 32G X 4 MM MISC, To inject Tresiba , Disp: 100 each, Rfl: 3   levothyroxine  (SYNTHROID ) 112 MCG tablet, Take 1 tablet (112 mcg total) by mouth daily before  breakfast. Needs appt, Disp: 90 tablet, Rfl: 0   lisinopril  (ZESTRIL ) 5 MG tablet, TAKE 1 TABLET BY MOUTH DAILY, Disp: 100 tablet, Rfl: 2   Multiple Vitamin (MULTIVITAMIN) tablet, Take 1 tablet by mouth daily., Disp: , Rfl:    omeprazole  (PRILOSEC) 40 MG capsule, Take 40 mg by mouth daily., Disp: , Rfl:    ONETOUCH ULTRA test strip, USE AS DIRECTED UP TO 4 TIMES DAILY, Disp: 100 strip, Rfl: 1   tirzepatide  (MOUNJARO ) 7.5 MG/0.5ML Pen, Inject 7.5 mg into the skin once a week., Disp: 6 mL, Rfl: 3   venlafaxine  (EFFEXOR ) 37.5 MG tablet, Take 1 tablet (37.5 mg total) by mouth daily., Disp: 90 tablet, Rfl: 1   venlafaxine  XR (EFFEXOR -XR) 150 MG 24 hr capsule, Take 1 capsule (150 mg total) by mouth daily., Disp: 90 capsule, Rfl: 1   Vitamin D , Ergocalciferol , (DRISDOL ) 1.25 MG (50000 UNIT) CAPS capsule, TAKE 1 CAPSULE BY MOUTH EVERY 7  DAYS, Disp: 15 capsule, Rfl: 2 Medication Side Effects: none  Family Medical/ Social History: Changes? No  MENTAL HEALTH EXAM:  There were no vitals taken for this visit.There is no height or weight on file to calculate BMI.  General Appearance: Casual, Neat, and Well Groomed  Eye Contact:  Good  Speech:  Clear and Coherent  Volume:  Normal  Mood:  NA  Affect:  Appropriate  Thought Process:  Coherent  Orientation:  Full (Time, Place,  and Person)  Thought Content: Logical   Suicidal Thoughts:  No  Homicidal Thoughts:  No  Memory:  WNL  Judgement:  Good  Insight:  Good  Psychomotor Activity:  Normal  Concentration:  Concentration: Good  Recall:  Good  Fund of Knowledge: Good  Language: Good  Assets:  Desire for Improvement  ADL's:  Intact  Cognition: WNL  Prognosis:  Good    DIAGNOSES:    ICD-10-CM   1. Depression, major, single episode, moderate (HCC)  F32.1 venlafaxine  XR (EFFEXOR -XR) 150 MG 24 hr capsule    2. Generalized anxiety disorder  F41.1 venlafaxine  (EFFEXOR ) 37.5 MG tablet    clonazePAM  (KLONOPIN ) 0.5 MG tablet    3. Major depressive  disorder, recurrent episode, moderate (HCC)  F33.1 venlafaxine  (EFFEXOR ) 37.5 MG tablet    clonazePAM  (KLONOPIN ) 0.5 MG tablet      Receiving Psychotherapy: No    RECOMMENDATIONS:   Greater than 50% of 30  min face to face time with patient was spent on counseling and coordination of care. We discussed her current level of stability. Still adjusting to life post fathers passing. Strong relationship with brother.  Trying to stay active in social life.  She is very happy with her medications right now and does not want to make any changes at this time. Discussed health coping mechanisms. [facility] taking water aerobics.  We agreed to:   To continue Effexor  to 187.5 mg daily.  Patient will have to continue 150 mg ER Capsule combined with a 37.5 mg capsule daily. Two separate RX. To continue Klonopin  0.5 mg twice daily for severe anxiety Will report worsening symptoms or side effects promptly Provided emergency contact information Will follow-up in 4 months to reassess Discussed potential benefits, risk, and side effects of benzodiazepines to include potential risk of tolerance and dependence, as well as possible drowsiness.  Advised patient not to drive if experiencing drowsiness and to take lowest possible effective dose to minimize risk of dependence and tolerance.  Reviewed PDMP    Redell DELENA Pizza, NP

## 2023-08-30 NOTE — Addendum Note (Signed)
 Addended by: TAWNI DRILLING D on: 08/30/2023 11:57 AM   Modules accepted: Orders

## 2023-09-09 ENCOUNTER — Ambulatory Visit (INDEPENDENT_AMBULATORY_CARE_PROVIDER_SITE_OTHER): Payer: Self-pay

## 2023-09-09 DIAGNOSIS — I639 Cerebral infarction, unspecified: Secondary | ICD-10-CM | POA: Diagnosis not present

## 2023-09-10 LAB — CUP PACEART REMOTE DEVICE CHECK
Date Time Interrogation Session: 20250720233711
Implantable Pulse Generator Implant Date: 20240501

## 2023-09-11 ENCOUNTER — Encounter: Payer: Self-pay | Admitting: Internal Medicine

## 2023-09-11 ENCOUNTER — Other Ambulatory Visit: Payer: Self-pay | Admitting: Family Medicine

## 2023-09-11 DIAGNOSIS — G4733 Obstructive sleep apnea (adult) (pediatric): Secondary | ICD-10-CM

## 2023-09-16 ENCOUNTER — Ambulatory Visit: Payer: Self-pay | Admitting: Cardiology

## 2023-09-17 ENCOUNTER — Other Ambulatory Visit (HOSPITAL_BASED_OUTPATIENT_CLINIC_OR_DEPARTMENT_OTHER): Payer: Self-pay | Admitting: Family Medicine

## 2023-09-17 DIAGNOSIS — Z1231 Encounter for screening mammogram for malignant neoplasm of breast: Secondary | ICD-10-CM

## 2023-09-20 ENCOUNTER — Ambulatory Visit (HOSPITAL_BASED_OUTPATIENT_CLINIC_OR_DEPARTMENT_OTHER)
Admission: RE | Admit: 2023-09-20 | Discharge: 2023-09-20 | Disposition: A | Discharge status: Home / Self Care | Source: Ambulatory Visit

## 2023-09-20 ENCOUNTER — Encounter (HOSPITAL_BASED_OUTPATIENT_CLINIC_OR_DEPARTMENT_OTHER): Payer: Self-pay | Admitting: Radiology

## 2023-09-20 DIAGNOSIS — Z1231 Encounter for screening mammogram for malignant neoplasm of breast: Secondary | ICD-10-CM | POA: Insufficient documentation

## 2023-09-24 ENCOUNTER — Ambulatory Visit: Admitting: Family Medicine

## 2023-09-24 ENCOUNTER — Encounter: Payer: Self-pay | Admitting: Family Medicine

## 2023-09-24 VITALS — BP 112/80 | HR 75 | Temp 98.4°F | Resp 18 | Ht 65.5 in | Wt 233.8 lb

## 2023-09-24 DIAGNOSIS — E1169 Type 2 diabetes mellitus with other specified complication: Secondary | ICD-10-CM | POA: Diagnosis not present

## 2023-09-24 DIAGNOSIS — E039 Hypothyroidism, unspecified: Secondary | ICD-10-CM

## 2023-09-24 DIAGNOSIS — Z7985 Long-term (current) use of injectable non-insulin antidiabetic drugs: Secondary | ICD-10-CM

## 2023-09-24 DIAGNOSIS — E785 Hyperlipidemia, unspecified: Secondary | ICD-10-CM

## 2023-09-24 DIAGNOSIS — I10 Essential (primary) hypertension: Secondary | ICD-10-CM

## 2023-09-24 DIAGNOSIS — E1165 Type 2 diabetes mellitus with hyperglycemia: Secondary | ICD-10-CM

## 2023-09-24 NOTE — Progress Notes (Unsigned)
 Subjective:    Patient ID: Amy Mahoney, female    DOB: Apr 18, 1950, 73 y.o.   MRN: [id number]  Chief Complaint  Patient presents with   Diabetes   Follow-up    HPI Patient is in today for f/u.   Discussed the use of AI scribe software for clinical note transcription with the patient, who gave verbal consent to proceed.  History of Present Illness Amy Mahoney Amy Mahoney is a 73 year old female with diabetes who presents with hand and wrist pain after water aerobics.  She experiences soreness in her hand and wrist following water aerobics earlier today. The pain originates in her hand and extends into her wrist after using flotation devices during water aerobics. The pain worsens with pressure on the affected area. There have been no recent falls.  She has a history of diabetes, managed with Mounjaro ,  and reports stable blood sugar levels. She is on aspirin  therapy, which may contribute to easy bruising. She has a family history of heart murmurs, as her mother had one, and she herself has been told she has a heart murmur. No shortness of breath or swelling in her legs.  She maintains an active lifestyle, engaging in travel and social activities. She recently had a mammogram and has an eye check-up scheduled for later this month. A home health visit assessed her circulation, which was found to be good. She is up to date with vaccinations, except for the flu shot.    Past Medical History:  Diagnosis Date   Abnormal esophagram    Abscess 04/15/2015   Abscess of skin 04/15/2015   Anxiety    Arthritis    Back pain    Bacterial vaginal infection 12/17/2014   Bronchitis with bronchospasm 04/14/2013   Chest heaviness 07/11/2020   Class 3 severe obesity with serious comorbidity and body mass index (BMI) of 40.0 to 44.9 in adult 06/10/2018   Constipation    Cough 09/17/2013   DEPRESSION 09/05/2006   Qualifier: Diagnosis of  By: Antonio ROSALEA Rockers     Depression with anxiety  07/11/2020   Diabetes mellitus without complication (HCC)    DIZZINESS, CHRONIC 05/23/2006   Qualifier: Diagnosis of  By: Antonio ROSALEA Rockers     Dysphagia    FATIGUE 06/01/2009   Qualifier: Diagnosis of  By: Antonio ROSALEA Rockers     Gallbladder problem    Gastroesophageal reflux disease 05/24/2017   Heart murmur    Hyperlipidemia    Hyperlipidemia associated with type 2 diabetes mellitus (HCC) 07/11/2020   Hyperlipidemia LDL goal <100 11/06/2012   Hypothyroidism 05/23/2006   Qualifier: Diagnosis of  By: Antonio DORockers     Lichen planus 12/01/2020   Mild aortic stenosis 11/29/2021   MORBID OBESITY 03/30/2008   Qualifier: Diagnosis of  By: Antonio ROSALEA Rockers     Muscle spasm of back 11/19/2014   Obstructive sleep apnea 05/23/2006   NPSG 08/03/94 RDI/AHI 49/hr, weight was 285 pounds CPAP/ [facility]    Paronychia of index finger 08/08/2021   Pharyngitis 03/03/2017   Post-nasal drainage 08/03/2019   Last Assessment & Plan:  Formatting of this note might be different from the original. Concern over postnasal drainage. Chronic at least a year history of the above but over the last month seems to have worsened.  She has tried antihistamine therapy without improvement.  Denies obvious heartburn.  Some associated cough and occasional near emesis episodes.  She is not on an ACE inhibitor. 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   Subungual hematoma of fifth toe of right foot 10/29/2017   Swelling    Type 2 diabetes mellitus (HCC) 09/02/2020   Type 2 diabetes mellitus with hyperglycemia (HCC) 06/10/2018    Past Surgical History:  Procedure Laterality Date   CHOLECYSTECTOMY     DILATION AND CURETTAGE OF UTERUS     ESOPHAGOGASTRODUODENOSCOPY (EGD) WITH PROPOFOL  N/A 09/05/2017   Procedure: ESOPHAGOGASTRODUODENOSCOPY (EGD) WITH PROPOFOL ;  Surgeon: Albertus Gordy HERO, MD;  Location:  [facility];  Service: Gastroenterology;  Laterality: N/A;   fibroids scrapping     LOOP RECORDER INSERTION N/A 06/20/2022   Procedure: LOOP RECORDER INSERTION;  Surgeon: Inocencio Soyla Lunger, MD;  Location: [facility];  Service: Cardiovascular;  Laterality: N/A;   TONSILLECTOMY      Family History  Problem Relation Age of Onset   Diabetes Mother    Thyroid  disease Mother    Depression Mother    Arthritis Mother    Hearing loss Father    Anxiety disorder Brother    Diabetes Maternal Aunt    Lung cancer Maternal Aunt        Nonsmoker   Cancer Maternal Aunt        breast   Breast cancer Maternal Aunt    Cancer Maternal Aunt        lung(nonsmoker)   Breast cancer Paternal Grandmother    Depression Other    Hyperlipidemia Other    Arthritis Other    Colon cancer Neg Hx    Pancreatic cancer Neg Hx    Stomach cancer Neg Hx    Esophageal cancer Neg Hx    Rectal cancer Neg Hx    Stroke Neg Hx     Social History   Socioeconomic History   Marital status: Divorced    Spouse name: Not on file   Number of children: 0   Years of education: Not on file   Highest education level: Bachelor's degree (e.g., BA, AB, BS)  Occupational History   Occupation: Eps Midwife: [employer]    Employer: [employer]   Occupation: RETIRED  Tobacco Use   Smoking status: Former    Current packs/day: 0.00    Average packs/day: 0.5 packs/day for 3.0 years (1.5 ttl pk-yrs)    Types: Cigarettes    Start date: 08/10/1987    Quit date: 08/10/1990    Years since quitting: 33.1   Smokeless tobacco: Never  Vaping Use   Vaping status: Never Used  Substance and Sexual Activity   Alcohol use: Not Currently    Comment: rarely   Drug use: No   Sexual activity: Not Currently    Partners: Male    Birth control/protection: Abstinence    Comment: too old  Other Topics Concern   Not on file  Social History Narrative   Regular exercise - yes-- 10,000 steps   Caffeine  use: 2 cups of coffee daily   Live  in [place] with 49 year old father to assist in care. Will be moving in with brother and his wife into larger home to facilitate better care.    Social Drivers of Corporate investment banker Strain: Low Risk  (09/18/2023)   Overall Financial Resource Strain (CARDIA)    Difficulty of Paying Living Expenses: Not hard at all  Food Insecurity:  No Food Insecurity (09/18/2023)   Hunger Vital Sign    Worried About Running Out of Food in the Last Year: Never true    Ran Out of Food in the Last Year: Never true  Transportation Needs: No Transportation Needs (09/18/2023)   PRAPARE - Administrator, Civil Service (Medical): No    Lack of Transportation (Non-Medical): No  Physical Activity: Sufficiently Active (09/18/2023)   Exercise Vital Sign    Days of Exercise per Week: 4 days    Minutes of Exercise per Session: 60 min  Stress: No Stress Concern Present (09/18/2023)   [employer] of Occupational Health - Occupational Stress Questionnaire    Feeling of Stress: Not at all  Social Connections: Moderately Integrated (09/18/2023)   Social Connection and Isolation Panel    Frequency of Communication with Friends and Family: More than three times a week    Frequency of Social Gatherings with Friends and Family: More than three times a week    Attends Religious Services: More than 4 times per year    Active Member of [employer] or Organizations: Yes    Attends Engineer, structural: More than 4 times per year    Marital Status: Divorced  Intimate Partner Violence: Not At Risk (07/16/2023)   Humiliation, Afraid, Rape, and Kick questionnaire    Fear of Current or Ex-Partner: No    Emotionally Abused: No    Physically Abused: No    Sexually Abused: No    Outpatient Medications Prior to Visit  Medication Sig Dispense Refill   aspirin  EC 81 MG tablet Take 1 tablet (81 mg total) by mouth daily. Swallow whole. 30 tablet 12   atorvastatin  (LIPITOR)  80 MG tablet TAKE 1 TABLET BY MOUTH ONCE  DAILY 100 tablet 2   Calcium  Carbonate-Vitamin D  (CALCIUM  500 + D PO) Take 1 tablet by mouth 2 (two) times daily.     clonazePAM  (KLONOPIN ) 0.5 MG tablet Take 1 tablet (0.5 mg total) by mouth daily. 90 tablet 1   fenofibrate  160 MG tablet TAKE 1 TABLET BY MOUTH DAILY 100 tablet 2   Insulin  Pen Needle (BD PEN NEEDLE NANO 2ND GEN) 32G X 4 MM MISC To inject Tresiba  100 each 3   levothyroxine  (SYNTHROID ) 112 MCG tablet Take 1 tablet (112 mcg total) by mouth daily before breakfast. Needs appt 90 tablet 0   lisinopril  (ZESTRIL ) 5 MG tablet TAKE 1 TABLET BY MOUTH DAILY 100 tablet 2   Multiple Vitamin (MULTIVITAMIN) tablet Take 1 tablet by mouth daily.     omeprazole  (PRILOSEC) 40 MG capsule Take 40 mg by mouth daily.     ONETOUCH ULTRA test strip USE AS DIRECTED UP TO 4 TIMES DAILY 100 strip 1   tirzepatide  (MOUNJARO ) 7.5 MG/0.5ML Pen Inject 7.5 mg into the skin once a week. 6 mL 3   TRESIBA  FLEXTOUCH 100 UNIT/ML FlexTouch Pen INJECT SUBCUTANEOUSLY 10 UNITS  DAILY 15 mL 2   venlafaxine  (EFFEXOR ) 37.5 MG tablet Take 1 tablet (37.5 mg total) by mouth daily. 90 tablet 1   venlafaxine  XR (EFFEXOR -XR) 150 MG 24 hr capsule Take 1 capsule (150 mg total) by mouth daily. 90 capsule 1   Vitamin D , Ergocalciferol , (DRISDOL ) 1.25 MG (50000 UNIT) CAPS capsule TAKE 1 CAPSULE BY MOUTH EVERY 7  DAYS 15 capsule 2   No facility-administered medications prior to visit.    No Known Allergies  Review of Systems  Constitutional:  Negative for fever and malaise/fatigue.  HENT:  Negative  for congestion.   Eyes:  Negative for blurred vision.  Respiratory:  Negative for cough and shortness of breath.   Cardiovascular:  Negative for chest pain, palpitations and leg swelling.  Gastrointestinal:  Negative for abdominal pain, blood in stool, nausea and vomiting.  Genitourinary:  Negative for dysuria and frequency.  Musculoskeletal:  Negative for back pain and falls.  Skin:   Negative for rash.  Neurological:  Negative for dizziness, loss of consciousness and headaches.  Endo/Heme/Allergies:  Negative for environmental allergies.  Psychiatric/Behavioral:  Negative for depression. The patient is not nervous/anxious.        Objective:    Physical Exam Vitals and nursing note reviewed.  Constitutional:      General: She is not in acute distress.    Appearance: Normal appearance. She is well-developed.  HENT:     Head: Normocephalic and atraumatic.  Eyes:     General: No scleral icterus.       Right eye: No discharge.        Left eye: No discharge.  Cardiovascular:     Rate and Rhythm: Normal rate and regular rhythm.     Heart sounds: Murmur heard.  Pulmonary:     Effort: Pulmonary effort is normal. No respiratory distress.     Breath sounds: Normal breath sounds.  Musculoskeletal:        General: Normal range of motion.     Cervical back: Normal range of motion and neck supple.     Right lower leg: No edema.     Left lower leg: No edema.  Skin:    General: Skin is warm and dry.  Neurological:     Mental Status: She is alert and oriented to person, place, and time.  Psychiatric:        Mood and Affect: Mood normal.        Behavior: Behavior normal.        Thought Content: Thought content normal.        Judgment: Judgment normal.     BP 112/80 (BP Location: Left Arm, Patient Position: Sitting, Cuff Size: Large)   Pulse 75   Temp 98.4 F (36.9 C) (Oral)   Resp 18   Ht 5' 5.5 (1.664 m)   Wt 233 lb 12.8 oz (106.1 kg)   SpO2 97%   BMI 38.31 kg/m  Wt Readings from Last 3 Encounters:  09/24/23 233 lb 12.8 oz (106.1 kg)  07/16/23 240 lb (108.9 kg)  03/12/23 240 lb 9.6 oz (109.1 kg)    Diabetic Foot Exam - Simple   Simple Foot Form Diabetic Foot exam was performed with the following findings: Yes 09/24/2023  2:36 PM  Visual Inspection No deformities, no ulcerations, no other skin breakdown bilaterally: Yes Sensation Testing Intact to  touch and monofilament testing bilaterally: Yes Pulse Check Posterior Tibialis and Dorsalis pulse intact bilaterally: Yes Comments    Lab Results  Component Value Date   WBC 7.5 09/24/2023   HGB 13.2 09/24/2023   HCT 40.5 09/24/2023   PLT 327.0 09/24/2023   GLUCOSE 118 (H) 09/24/2023   CHOL 117 09/24/2023   TRIG 138.0 09/24/2023   HDL 39.50 09/24/2023   LDLDIRECT 193.3 11/06/2012   LDLCALC 50 09/24/2023   ALT 21 09/24/2023   AST 27 09/24/2023   NA 140 09/24/2023   K 4.5 09/24/2023   CL 104 09/24/2023   CREATININE 0.89 09/24/2023   BUN 26 (H) 09/24/2023   CO2 27 09/24/2023   TSH 0.18 (L) 09/24/2023  HGBA1C 6.0 09/24/2023   MICROALBUR 4.8 (H) 09/24/2023    Lab Results  Component Value Date   TSH 0.18 (L) 09/24/2023   Lab Results  Component Value Date   WBC 7.5 09/24/2023   HGB 13.2 09/24/2023   HCT 40.5 09/24/2023   MCV 94.1 09/24/2023   PLT 327.0 09/24/2023   Lab Results  Component Value Date   NA 140 09/24/2023   K 4.5 09/24/2023   CO2 27 09/24/2023   GLUCOSE 118 (H) 09/24/2023   BUN 26 (H) 09/24/2023   CREATININE 0.89 09/24/2023   BILITOT 0.5 09/24/2023   ALKPHOS 47 09/24/2023   AST 27 09/24/2023   ALT 21 09/24/2023   PROT 6.8 09/24/2023   ALBUMIN 4.3 09/24/2023   CALCIUM  10.0 09/24/2023   ANIONGAP 6 06/20/2022   GFR 64.09 09/24/2023   Lab Results  Component Value Date   CHOL 117 09/24/2023   Lab Results  Component Value Date   HDL 39.50 09/24/2023   Lab Results  Component Value Date   LDLCALC 50 09/24/2023   Lab Results  Component Value Date   TRIG 138.0 09/24/2023   Lab Results  Component Value Date   CHOLHDL 3 09/24/2023   Lab Results  Component Value Date   HGBA1C 6.0 09/24/2023       Assessment & Plan:  Hypothyroidism, unspecified type -     TSH  Type 2 diabetes mellitus with hyperglycemia, unspecified whether long term insulin  use (HCC) Assessment & Plan: Con't mounjaro  Check labs    Orders: -     Lipid panel -      CBC with Differential/Platelet -     Comprehensive metabolic panel with GFR -     Hemoglobin A1c -     Microalbumin / creatinine urine ratio  Hyperlipidemia associated with type 2 diabetes mellitus (HCC) -     Lipid panel -     CBC with Differential/Platelet -     Comprehensive metabolic panel with GFR -     Hemoglobin A1c  Primary hypertension Assessment & Plan: Well controlled, no changes to meds. Encouraged heart healthy diet such as the DASH diet and exercise as tolerated.    Orders: -     Lipid panel -     CBC with Differential/Platelet -     Comprehensive metabolic panel with GFR -     Hemoglobin A1c  Assessment and Plan Assessment & Plan Hand and wrist pain   She experiences acute hand and wrist pain following water aerobics, likely from hyperextension or tendon strain due to floatation device use. Bruising is observed, probably due to thin skin and aspirin  use, with no history of trauma or fall.  Type 2 diabetes mellitus   Her type 2 diabetes mellitus is well-controlled on Monserrate, with good blood glucose levels reported. Continue the current medication regimen and regularly monitor blood glucose levels.  Heart murmur   She has a heart murmur with no current symptoms of concern, such as shortness of breath or leg swelling.  Easy bruising   Easy bruising is likely due to thin skin and aspirin  use, occurring with minimal trauma. Continue aspirin  as prescribed and discuss any concerns with the primary care provider.    Amy Guse R Lowne Chase, DO

## 2023-09-25 LAB — LIPID PANEL
Cholesterol: 117 mg/dL (ref 0–200)
HDL: 39.5 mg/dL (ref >39.00)
LDL Cholesterol: 50 mg/dL (ref 0–99)
NonHDL: 77.5
Total CHOL/HDL Ratio: 3
Triglycerides: 138 mg/dL (ref 0.0–149.0)
VLDL: 27.6 mg/dL (ref 0.0–40.0)

## 2023-09-25 LAB — CBC WITH DIFFERENTIAL/PLATELET
Basophils Absolute: 0.1 K/uL (ref 0.0–0.1)
Basophils Relative: 1.1 % (ref 0.0–3.0)
Eosinophils Absolute: 0.4 K/uL (ref 0.0–0.7)
Eosinophils Relative: 5 % (ref 0.0–5.0)
HCT: 40.5 % (ref 36.0–46.0)
Hemoglobin: 13.2 g/dL (ref 12.0–15.0)
Lymphocytes Relative: 29.2 % (ref 12.0–46.0)
Lymphs Abs: 2.2 K/uL (ref 0.7–4.0)
MCHC: 32.7 g/dL (ref 30.0–36.0)
MCV: 94.1 fl (ref 78.0–100.0)
Monocytes Absolute: 0.5 K/uL (ref 0.1–1.0)
Monocytes Relative: 6.4 % (ref 3.0–12.0)
Neutro Abs: 4.4 K/uL (ref 1.4–7.7)
Neutrophils Relative %: 58.3 % (ref 43.0–77.0)
Platelets: 327 K/uL (ref 150.0–400.0)
RBC: 4.31 Mil/uL (ref 3.87–5.11)
RDW: 13.8 % (ref 11.5–15.5)
WBC: 7.5 K/uL (ref 4.0–10.5)

## 2023-09-25 LAB — MICROALBUMIN / CREATININE URINE RATIO
Creatinine,U: 78.1 mg/dL
Microalb Creat Ratio: 61.4 mg/g — ABNORMAL HIGH (ref 0.0–30.0)
Microalb, Ur: 4.8 mg/dL — ABNORMAL HIGH (ref 0.0–1.9)

## 2023-09-25 LAB — COMPREHENSIVE METABOLIC PANEL WITH GFR
ALT: 21 U/L (ref 0–35)
AST: 27 U/L (ref 0–37)
Albumin: 4.3 g/dL (ref 3.5–5.2)
Alkaline Phosphatase: 47 U/L (ref 39–117)
BUN: 26 mg/dL — ABNORMAL HIGH (ref 6–23)
CO2: 27 meq/L (ref 19–32)
Calcium: 10 mg/dL (ref 8.4–10.5)
Chloride: 104 meq/L (ref 96–112)
Creatinine, Ser: 0.89 mg/dL (ref 0.40–1.20)
GFR: 64.09 mL/min (ref >60.00)
Glucose, Bld: 118 mg/dL — ABNORMAL HIGH (ref 70–99)
Potassium: 4.5 meq/L (ref 3.5–5.1)
Sodium: 140 meq/L (ref 135–145)
Total Bilirubin: 0.5 mg/dL (ref 0.2–1.2)
Total Protein: 6.8 g/dL (ref 6.0–8.3)

## 2023-09-25 LAB — TSH: TSH: 0.18 u[IU]/mL — ABNORMAL LOW (ref 0.35–5.50)

## 2023-09-25 LAB — HEMOGLOBIN A1C: Hgb A1c MFr Bld: 6 % (ref 4.6–6.5)

## 2023-09-25 NOTE — Telephone Encounter (Signed)
 Please advise on what is needed to get a new machine.

## 2023-09-25 NOTE — Telephone Encounter (Signed)
 Order- [facility]ME [facility]- please replace old CPAP machine, motor life exceeded > auto 10-20, mask of choice, humidifier, supplies,s AirView/ card

## 2023-09-26 NOTE — Assessment & Plan Note (Signed)
 Well controlled, no changes to meds. Encouraged heart healthy diet such as the DASH diet and exercise as tolerated.

## 2023-09-26 NOTE — Assessment & Plan Note (Signed)
 Con't mounjaro  Check labs

## 2023-10-08 NOTE — Progress Notes (Signed)
 [facility] Summary Report / Loop Recorder

## 2023-10-10 ENCOUNTER — Ambulatory Visit (INDEPENDENT_AMBULATORY_CARE_PROVIDER_SITE_OTHER): Payer: Self-pay

## 2023-10-10 ENCOUNTER — Ambulatory Visit: Payer: Self-pay | Admitting: Cardiology

## 2023-10-10 DIAGNOSIS — I639 Cerebral infarction, unspecified: Secondary | ICD-10-CM | POA: Diagnosis not present

## 2023-10-10 LAB — CUP PACEART REMOTE DEVICE CHECK
Date Time Interrogation Session: 20250820234554
Implantable Pulse Generator Implant Date: 20240501

## 2023-10-12 ENCOUNTER — Other Ambulatory Visit: Payer: Self-pay | Admitting: Family Medicine

## 2023-10-16 DIAGNOSIS — G4733 Obstructive sleep apnea (adult) (pediatric): Secondary | ICD-10-CM | POA: Diagnosis not present

## 2023-11-05 DIAGNOSIS — E119 Type 2 diabetes mellitus without complications: Secondary | ICD-10-CM | POA: Diagnosis not present

## 2023-11-05 DIAGNOSIS — H5203 Hypermetropia, bilateral: Secondary | ICD-10-CM | POA: Diagnosis not present

## 2023-11-05 DIAGNOSIS — H31003 Unspecified chorioretinal scars, bilateral: Secondary | ICD-10-CM | POA: Diagnosis not present

## 2023-11-05 DIAGNOSIS — H52203 Unspecified astigmatism, bilateral: Secondary | ICD-10-CM | POA: Diagnosis not present

## 2023-11-05 DIAGNOSIS — H25813 Combined forms of age-related cataract, bilateral: Secondary | ICD-10-CM | POA: Diagnosis not present

## 2023-11-05 LAB — HM DIABETES EYE EXAM

## 2023-11-11 ENCOUNTER — Ambulatory Visit: Payer: Self-pay | Admitting: Cardiology

## 2023-11-11 ENCOUNTER — Ambulatory Visit (INDEPENDENT_AMBULATORY_CARE_PROVIDER_SITE_OTHER): Payer: Self-pay

## 2023-11-11 DIAGNOSIS — I639 Cerebral infarction, unspecified: Secondary | ICD-10-CM | POA: Diagnosis not present

## 2023-11-11 LAB — CUP PACEART REMOTE DEVICE CHECK
Date Time Interrogation Session: 20250921233043
Implantable Pulse Generator Implant Date: 20240501

## 2023-11-12 NOTE — Progress Notes (Signed)
 Remote Loop Recorder Transmission

## 2023-11-15 ENCOUNTER — Other Ambulatory Visit: Payer: Self-pay | Admitting: Family Medicine

## 2023-11-15 DIAGNOSIS — E1165 Type 2 diabetes mellitus with hyperglycemia: Secondary | ICD-10-CM

## 2023-11-25 NOTE — Progress Notes (Signed)
 Remote Loop Recorder Transmission

## 2023-12-09 NOTE — Progress Notes (Unsigned)
 HPI female former smoker followed for OSA, Cough, complicated by obesity, hypothyroid, GERD NPSG 08/03/94 RDI/AHI 49/hr, weight was 285 pounds  --------------------------------------------------------------------------   12/10/22- 73 year old female former light smoker followed for OSA, Cough, complicated by Obesity, hypothyroid, GERD, DM2, Hyperlipidemia, HTN, CVA, -clonazepam  0.5,      note lisinopril  CPAP auto 10-20/[employer] Download- compliance 100%, AHI 3.1/hr Body weight today-230 lbs Had CVA in May - some numb L face, weak L leg. Better after PT and now doing water exercises. Breathing is fine. No change in chronic heart murmur. Download reviewed. Very comfortable with her CPAP and never sleeps without it.  12/10/22- 73 year old female former light smoker followed for OSA, Cough, complicated by Obesity, hypothyroid, GERD, DM2, Hyperlipidemia, HTN, CVA, -clonazepam  0.5,      note lisinopril  CPAP auto 10-20/[employer] Download- compliance - went back to using old machine, but use every night with benefit. Body weight today- 237 lbs Discussed the use of AI scribe software for clinical note transcription with the patient, who gave verbal consent to proceed.  History of Present Illness   Amy Mahoney Pronounced Amy Mahoney is a 73 year old female who presents with issues related to her CPAP machine.  She experiences discomfort with her new CPAP machine due to the ramp feature starting at a lower pressure than she is accustomed to. Her previous machine started at full speed, which she preferred. The ramp up from four is uncomfortable for her. She went back to using her old machine for now. She does recognize CPAP benefit.     Assessment and Plan:    Obstructive sleep apnea Discomfort with CPAP due to ramp feature starting at lower pressure. - Instructed home care company to shorten ramp feature on CPAP. - Ensure CPAP settings remain between 10 and 20 cm H2O.   Obesity -encourage effort     ROS-see HPI   + = positive Constitutional:   No-   weight loss, night sweats, fevers, chills, fatigue, lassitude. HEENT:   No-  headaches, difficulty swallowing, tooth/dental problems, sore throat,       No-  sneezing, itching, ear ache, +nasal congestion, post nasal drip,  CV:  No-   chest pain, orthopnea, PND, swelling in lower extremities, anasarca,  dizziness, palpitations Resp: No-   shortness of breath with exertion or at rest.               productive cough,   non-productive cough,  No- coughing up of blood.              No-   change in color of mucus.  No- wheezing.   Skin: No-   rash or lesions. GI:  +heartburn, indigestion, abdominal pain, nausea, vomiting, GU: . MS:  No-   joint pain or swelling.   Neuro-     nothing unusual Psych:  No- change in mood or affect. No depression or anxiety.  No memory loss.  OBJ- Physical Exam General- Alert, Oriented, Affect-appropriate, Distress- none acute, + morbidly obese Skin- rash-none, lesions- none, excoriation- none Lymphadenopathy- none Head- atraumatic            Eyes- Gross vision intact, PERRLA, conjunctivae and secretions clear            Ears- Hearing, canals-normal            Nose- Clear, no-Septal dev, mucus, polyps, erosion, perforation             Throat- Mallampati III-IV , mucosa clear , drainage- none, tonsils-  atrophic Neck- flexible , trachea midline, no stridor , thyroid  nl, carotid no bruit Chest - symmetrical excursion , unlabored           Heart/CV- RRR , +2-3/6 systolic M, no gallop  , no rub, nl s1 s2                           - JVD- none , edema- none, stasis changes- none, varices- none           Lung- clear, wheeze- none, cough- none , dullness-none, rub- none           Chest wall-  Abd-  Br/ Gen/ Rectal- Not done, not indicated Extrem- cyanosis- none, clubbing, none, atrophy- none, strength- nl Neuro- grossly intact to observation   .

## 2023-12-10 ENCOUNTER — Encounter: Payer: Self-pay | Admitting: Internal Medicine

## 2023-12-10 ENCOUNTER — Ambulatory Visit: Payer: Medicare Other | Admitting: Internal Medicine

## 2023-12-10 VITALS — BP 134/82 | HR 72 | Temp 98.0°F | Ht 65.0 in | Wt 237.8 lb

## 2023-12-10 DIAGNOSIS — G4733 Obstructive sleep apnea (adult) (pediatric): Secondary | ICD-10-CM

## 2023-12-10 DIAGNOSIS — Z87891 Personal history of nicotine dependence: Secondary | ICD-10-CM

## 2023-12-10 NOTE — Patient Instructions (Signed)
 Order- DME [employer]- please shorten ramp and ensure CPAP is set 10-20 auto, continue [employer], supplies

## 2023-12-13 ENCOUNTER — Other Ambulatory Visit: Payer: Self-pay | Admitting: Orthopaedic Surgery

## 2023-12-13 ENCOUNTER — Encounter: Payer: Self-pay | Admitting: Internal Medicine

## 2023-12-13 ENCOUNTER — Ambulatory Visit: Attending: Cardiology

## 2023-12-13 DIAGNOSIS — I639 Cerebral infarction, unspecified: Secondary | ICD-10-CM

## 2023-12-13 DIAGNOSIS — M19072 Primary osteoarthritis, left ankle and foot: Secondary | ICD-10-CM

## 2023-12-14 LAB — CUP PACEART REMOTE DEVICE CHECK
Date Time Interrogation Session: 20251023233111
Implantable Pulse Generator Implant Date: 20240501

## 2023-12-16 ENCOUNTER — Other Ambulatory Visit: Payer: Self-pay | Admitting: Family Medicine

## 2023-12-16 DIAGNOSIS — G4733 Obstructive sleep apnea (adult) (pediatric): Secondary | ICD-10-CM | POA: Diagnosis not present

## 2023-12-17 NOTE — Progress Notes (Signed)
 Remote Loop Recorder Transmission

## 2023-12-18 ENCOUNTER — Ambulatory Visit: Payer: Self-pay | Admitting: Cardiology

## 2023-12-18 ENCOUNTER — Ambulatory Visit
Admission: RE | Admit: 2023-12-18 | Discharge: 2023-12-18 | Disposition: A | Source: Ambulatory Visit | Attending: Orthopaedic Surgery

## 2023-12-18 DIAGNOSIS — M19072 Primary osteoarthritis, left ankle and foot: Secondary | ICD-10-CM

## 2023-12-23 ENCOUNTER — Encounter: Payer: Self-pay | Admitting: Radiology

## 2023-12-31 ENCOUNTER — Ambulatory Visit: Admitting: Behavioral Health

## 2023-12-31 ENCOUNTER — Encounter: Payer: Self-pay | Admitting: Behavioral Health

## 2023-12-31 DIAGNOSIS — F3341 Major depressive disorder, recurrent, in partial remission: Secondary | ICD-10-CM

## 2023-12-31 DIAGNOSIS — F411 Generalized anxiety disorder: Secondary | ICD-10-CM | POA: Diagnosis not present

## 2023-12-31 MED ORDER — CLONAZEPAM 0.5 MG PO TABS: 0.5000 mg | ORAL_TABLET | Freq: Every day | ORAL | 1 refills | Status: AC

## 2023-12-31 MED ORDER — VENLAFAXINE HCL ER 37.5 MG PO CP24: 37.5000 mg | ORAL_CAPSULE | Freq: Every day | ORAL | 1 refills | Status: AC

## 2023-12-31 MED ORDER — VENLAFAXINE HCL ER 150 MG PO CP24: 150.0000 mg | ORAL_CAPSULE | Freq: Every day | ORAL | 1 refills | Status: AC

## 2023-12-31 NOTE — Progress Notes (Signed)
 [facility]  Patient ID: LANEISHA MINO,  MRN: [id number]  PCP: Antonio Cyndee Jamee JONELLE, DO  Date of Evaluation: 12/31/2023 Time spent:20 minutes  Chief Complaint:  Chief Complaint   Anxiety; Depression; Follow-up; Patient Education; Medication Refill     HISTORY/CURRENT STATUS: HPI  Flor, 73 year old female presents to this office for follow up and medication management.  No changes this visit. She is delightful and pleasant patient.  Positive nature. Says that she currently feels stable and content.  Strong support network and close to brother.  Just switched condo's with her brother since her dad passed and getting moved in. She enjoys life and will be planning another vacation for next year. Medications continue to provide good stability.  She says her anxiety level today is 2 of 10 and her depression is 2 of 10.  She is sleeping 7 to 8 hours per night.  She follows up with her PCP and other specialist regularly as needed.  She denies any history of mania, no psychosis denies any history of auditory or visual hallucinations.  Denies SI or HI.  Past psychotropic medications: Effexor  Klonopin     Individual Medical History/ Review of Systems: Changes? :No   Allergies: Patient has no known allergies.  Current Medications:  Current Outpatient Medications:    venlafaxine  XR (EFFEXOR  XR) 37.5 MG 24 hr capsule, Take 1 capsule (37.5 mg total) by mouth daily with breakfast., Disp: 90 capsule, Rfl: 1   aspirin  EC 81 MG tablet, Take 1 tablet (81 mg total) by mouth daily. Swallow whole., Disp: 30 tablet, Rfl: 12   atorvastatin  (LIPITOR) 80 MG tablet, TAKE 1 TABLET BY MOUTH ONCE  DAILY, Disp: 100 tablet, Rfl: 2   Calcium  Carbonate-Vitamin D  (CALCIUM  500 + D PO), Take 1 tablet by mouth 2 (two) times daily., Disp: , Rfl:    clonazePAM  (KLONOPIN ) 0.5 MG tablet, Take 1 tablet (0.5 mg total) by mouth daily., Disp: 90 tablet, Rfl: 1   fenofibrate  160 MG tablet, TAKE 1 TABLET BY MOUTH  DAILY, Disp: 100 tablet, Rfl: 2   Insulin  Pen Needle (BD PEN NEEDLE NANO 2ND GEN) 32G X 4 MM MISC, To inject Tresiba , Disp: 100 each, Rfl: 3   levothyroxine  (SYNTHROID ) 112 MCG tablet, Take 1 tablet (112 mcg total) by mouth daily before breakfast., Disp: 90 tablet, Rfl: 1   lisinopril  (ZESTRIL ) 5 MG tablet, TAKE 1 TABLET BY MOUTH DAILY, Disp: 100 tablet, Rfl: 2   Multiple Vitamin (MULTIVITAMIN) tablet, Take 1 tablet by mouth daily., Disp: , Rfl:    omeprazole  (PRILOSEC) 40 MG capsule, Take 40 mg by mouth daily., Disp: , Rfl:    ONETOUCH ULTRA test strip, USE AS DIRECTED UP TO 4 TIMES DAILY, Disp: 100 strip, Rfl: 1   tirzepatide  (MOUNJARO ) 7.5 MG/0.5ML Pen, Inject 7.5 mg into the skin once a week., Disp: 2 mL, Rfl: 3   TRESIBA  FLEXTOUCH 100 UNIT/ML FlexTouch Pen, INJECT SUBCUTANEOUSLY 10 UNITS  DAILY, Disp: 15 mL, Rfl: 2   venlafaxine  (EFFEXOR ) 37.5 MG tablet, Take 1 tablet (37.5 mg total) by mouth daily., Disp: 90 tablet, Rfl: 1   venlafaxine  XR (EFFEXOR -XR) 150 MG 24 hr capsule, Take 1 capsule (150 mg total) by mouth daily., Disp: 90 capsule, Rfl: 1   Vitamin D , Ergocalciferol , (DRISDOL ) 1.25 MG (50000 UNIT) CAPS capsule, TAKE 1 CAPSULE BY MOUTH EVERY 7  DAYS, Disp: 15 capsule, Rfl: 2 Medication Side Effects: none  Family Medical/ Social History: Changes? No  MENTAL HEALTH EXAM:  There were  no vitals taken for this visit.There is no height or weight on file to calculate BMI.  General Appearance: Casual, Neat, and Well Groomed  Eye Contact:  Good  Speech:  Clear and Coherent  Volume:  Normal  Mood:  NA  Affect:  Appropriate  Thought Process:  Coherent  Orientation:  Full (Time, Place, and Person)  Thought Content: Logical   Suicidal Thoughts:  No  Homicidal Thoughts:  No  Memory:  WNL  Judgement:  Good  Insight:  Good  Psychomotor Activity:  Normal  Concentration:  Concentration: Good  Recall:  Good  Fund of Knowledge: Good  Language: Good  Assets:  Desire for Improvement   ADL's:  Intact  Cognition: WNL  Prognosis:  Good    DIAGNOSES:    ICD-10-CM   1. Recurrent major depressive disorder, in partial remission  F33.41     2. Generalized anxiety disorder  F41.1 clonazePAM  (KLONOPIN ) 0.5 MG tablet    venlafaxine  XR (EFFEXOR  XR) 37.5 MG 24 hr capsule      Receiving Psychotherapy: No    RECOMMENDATIONS:   Greater than 50% of 30  min face to face time with patient was spent on counseling and coordination of care. We discussed her current level of stability. Adjusting to life's changes very well. Enjoys living with a great attitude. She is great example of integrity in late stage of life.  Strong relationship with brother.  Stays active in social life.  She is very happy with her medications right now and does not want to make any changes at this time. Discussed health coping mechanisms.   We agreed to:   To continue Effexor  to 187.5 mg daily.  Patient will have to continue 150 mg ER Capsule combined with a 37.5 mg capsule daily. Two separate RX. To continue Klonopin  0.5 mg twice daily for severe anxiety Will report worsening symptoms or side effects promptly Provided emergency contact information Will follow-up in 4 months to reassess Discussed potential benefits, risk, and side effects of benzodiazepines to include potential risk of tolerance and dependence, as well as possible drowsiness.  Advised patient not to drive if experiencing drowsiness and to take lowest possible effective dose to minimize risk of dependence and tolerance.  Reviewed PDMP     Redell DELENA Pizza, NP

## 2024-01-06 ENCOUNTER — Telehealth: Payer: Self-pay

## 2024-01-06 NOTE — Telephone Encounter (Signed)
 Copied from CRM #[id number]. Topic: Clinical - Medical Advice >> Jan 06, 2024  4:13 PM Burnard DEL wrote: Reason for CRM: Patient stated that she went to [facility] and the doctor there thinks that she should have surgery due to liquid being that has formed around two of her tendons that are very inflammed as well.SABRAShe would like to know if Dr Antonio Meth thinks she should have a second opinion before she agrees to the surgery? Please advise

## 2024-01-13 ENCOUNTER — Ambulatory Visit

## 2024-01-13 DIAGNOSIS — I639 Cerebral infarction, unspecified: Secondary | ICD-10-CM | POA: Diagnosis not present

## 2024-01-14 LAB — CUP PACEART REMOTE DEVICE CHECK
Date Time Interrogation Session: 20251123235002
Implantable Pulse Generator Implant Date: 20240501

## 2024-01-14 NOTE — Progress Notes (Signed)
 Remote Loop Recorder Transmission

## 2024-01-15 ENCOUNTER — Ambulatory Visit: Payer: Self-pay | Admitting: Cardiology

## 2024-01-18 ENCOUNTER — Other Ambulatory Visit: Payer: Self-pay | Admitting: Family Medicine

## 2024-01-18 DIAGNOSIS — E559 Vitamin D deficiency, unspecified: Secondary | ICD-10-CM

## 2024-01-18 DIAGNOSIS — E785 Hyperlipidemia, unspecified: Secondary | ICD-10-CM

## 2024-01-28 ENCOUNTER — Telehealth (HOSPITAL_BASED_OUTPATIENT_CLINIC_OR_DEPARTMENT_OTHER): Payer: Self-pay

## 2024-01-28 DIAGNOSIS — M1990 Unspecified osteoarthritis, unspecified site: Secondary | ICD-10-CM | POA: Insufficient documentation

## 2024-01-28 NOTE — Telephone Encounter (Signed)
 Pt has appt 01/29/24 Dr. Revankar for preop clearance.

## 2024-01-28 NOTE — Telephone Encounter (Signed)
 Will send a message to Dr. Raeann scheduler to see if they can get pt schedule an appt in office. Looks like the pt is seeing the [facility] office.

## 2024-01-28 NOTE — Telephone Encounter (Signed)
   Name: Amy Mahoney  DOB: Dec 25, 1950  MRN: [id number]  Primary Cardiologist: None  Chart reviewed as part of pre-operative protocol coverage. Because of Amy Mahoney past medical history and time since last visit, she will require a follow-up in-office visit in order to better assess preoperative cardiovascular risk.  Pre-op covering staff: - Please schedule appointment and call patient to inform them. If patient already had an upcoming appointment within acceptable timeframe, please add pre-op clearance to the appointment notes so provider is aware. - Please contact requesting surgeon's office via preferred method (i.e, phone, fax) to inform them of need for appointment prior to surgery.   Mikel Hardgrove D Claudine Stallings, NP  01/28/2024, 9:36 AM

## 2024-01-28 NOTE — Telephone Encounter (Signed)
   Pre-operative Risk Assessment    Patient Name: Amy Mahoney  DOB: 02/01/1951 MRN: [id number]   Date of last office visit: 11/16/2022 with Dr. Edwyna  Date of next office visit: None  Request for Surgical Clearance    Procedure:  Left anterior and posterior tibialis debridement with possible gastrocnemius recession and/or tendo-achilles lengthening       Date of Surgery:  Clearance TBD                                 Surgeon:  Dr. Lillia Mountain  Surgeon's Group or Practice Name: [facility] Phone number:  [phone] or [phone] - Katheryn Dustman - office scheduler Fax number:  [phone]   Type of Clearance Requested:   - Medical  - Pharmacy:  [facility]  -Does not specify   Type of Anesthesia:  General    Additional requests/questions:  None  SignedPatrcia Iverson LITTIE   01/28/2024, 9:20 AM

## 2024-01-29 ENCOUNTER — Ambulatory Visit: Attending: Cardiology | Admitting: Cardiology

## 2024-01-29 ENCOUNTER — Encounter: Payer: Self-pay | Admitting: Cardiology

## 2024-01-29 VITALS — BP 126/82 | HR 83 | Resp 18 | Ht 65.0 in | Wt 232.8 lb

## 2024-01-29 DIAGNOSIS — I10 Essential (primary) hypertension: Secondary | ICD-10-CM

## 2024-01-29 DIAGNOSIS — E119 Type 2 diabetes mellitus without complications: Secondary | ICD-10-CM

## 2024-01-29 DIAGNOSIS — Z01810 Encounter for preprocedural cardiovascular examination: Secondary | ICD-10-CM | POA: Diagnosis not present

## 2024-01-29 DIAGNOSIS — E1169 Type 2 diabetes mellitus with other specified complication: Secondary | ICD-10-CM

## 2024-01-29 DIAGNOSIS — R0609 Other forms of dyspnea: Secondary | ICD-10-CM | POA: Diagnosis not present

## 2024-01-29 DIAGNOSIS — E785 Hyperlipidemia, unspecified: Secondary | ICD-10-CM | POA: Diagnosis not present

## 2024-01-29 DIAGNOSIS — G4733 Obstructive sleep apnea (adult) (pediatric): Secondary | ICD-10-CM | POA: Diagnosis not present

## 2024-01-29 DIAGNOSIS — I350 Nonrheumatic aortic (valve) stenosis: Secondary | ICD-10-CM

## 2024-01-29 MED ORDER — METOPROLOL TARTRATE 100 MG PO TABS: 100.0000 mg | ORAL_TABLET | Freq: Once | ORAL | 0 refills | Status: AC

## 2024-01-29 NOTE — Patient Instructions (Signed)
 Medication Instructions:  Your physician recommends that you continue on your current medications as directed. Please refer to the Current Medication list given to you today.  *If you need a refill on your cardiac medications before your next appointment, please call your pharmacy*   Lab Work: None ordered If you have labs (blood work) drawn today and your tests are completely normal, you will receive your results only by: MyChart Message (if you have MyChart) OR A paper copy in the mail If you have any lab test that is abnormal or we need to change your treatment, we will call you to review the results.   Testing/Procedures:   Your cardiac CT will be scheduled at one of the below locations:   [facility] [address] [place], [place] [address] [phone]  If scheduled at [facility], please arrive 30 minutes early for check-in and test prep.  Please follow these instructions carefully (unless otherwise directed):  An IV will be required for this test and Nitroglycerin will be given.   On the Night Before the Test: Be sure to Drink plenty of water. Do not consume any caffeinated/decaffeinated beverages or chocolate 12 hours prior to your test. Do not take any antihistamines 12 hours prior to your test.  On the Day of the Test: Drink plenty of water until 1 hour prior to the test. Do not eat any food 1 hour prior to test. You may take your regular medications prior to the test.  Take metoprolol (Lopressor) 100 mg two hours prior to test. Patients who wear a continuous glucose monitor MUST remove the device prior to scanning. FEMALES- please wear underwire-free bra if available, avoid dresses & tight clothing.      After the Test: Drink plenty of water. After receiving IV contrast, you may experience a mild flushed feeling. This is normal. On occasion, you may experience a mild rash up to 24 hours after the test. This is not dangerous. If this  occurs, you can take Benadryl 25 mg, Zyrtec, Claritin , or Allegra and increase your fluid intake. (Patients taking Tikosyn should avoid Benadryl, and may take Zyrtec, Claritin , or Allegra) If you experience trouble breathing, this can be serious. If it is severe call 911 IMMEDIATELY. If it is mild, please call our office.  We will call to schedule your test 2-4 weeks out understanding that some insurance companies will need an authorization prior to the service being performed.   For more information and frequently asked questions, please visit our website : [email or web address]  For non-scheduling related questions, please contact the cardiac imaging nurse navigator should you have any questions/concerns: Cardiac Imaging Nurse Navigators Direct Office Dial: [phone]   For scheduling needs, including cancellations and rescheduling, please call Brittany, [phone].  Your physician has requested that you have an echocardiogram. Echocardiography is a painless test that uses sound waves to create images of your heart. It provides your doctor with information about the size and shape of your heart and how well your hearts chambers and valves are working. This procedure takes approximately one hour. There are no restrictions for this procedure. Please do NOT wear cologne, perfume, aftershave, or lotions (deodorant is allowed). Please arrive 15 minutes prior to your appointment time.  Please note: We ask at that you not bring children with you during ultrasound (echo/ vascular) testing. Due to room size and safety concerns, children are not allowed in the ultrasound rooms during exams. Our front office staff cannot provide observation  of children in our lobby area while testing is being conducted. An adult accompanying a patient to their appointment will only be allowed in the ultrasound room at the discretion of the ultrasound technician under special circumstances. We apologize for  any inconvenience.   Follow-Up: At [facility], you and your health needs are our priority.  As part of our continuing mission to provide you with exceptional heart care, we have created designated Provider Care Teams.  These Care Teams include your primary Cardiologist (physician) and Advanced Practice Providers (APPs -  Physician Assistants and Nurse Practitioners) who all work together to provide you with the care you need, when you need it.  We recommend signing up for the patient portal called MyChart.  Sign up information is provided on this After Visit Summary.  MyChart is used to connect with patients for Virtual Visits (Telemedicine).  Patients are able to view lab/test results, encounter notes, upcoming appointments, etc.  Non-urgent messages can be sent to your provider as well.   To learn more about what you can do with MyChart, go to [email or web address].    Your next appointment:   9 month(s)  The format for your next appointment:   In Person  Provider:   Jennifer Crape, MD    Other Instructions none  Important Information About Sugar

## 2024-01-29 NOTE — Progress Notes (Signed)
 Cardiology Office Note:    Date:  01/29/2024   ID:  Amy Mahoney, DOB [birth year], MRN [id number]  PCP:  Antonio Meth, Jamee SAUNDERS, DO  Cardiologist:  Jennifer SAUNDERS Crape, MD   Referring MD: Antonio Meth, Jamee SAUNDERS, *    ASSESSMENT:    1. Primary hypertension   2. Mild aortic stenosis   3. Obstructive sleep apnea   4. Diabetes mellitus without complication (HCC)   5. Hyperlipidemia associated with type 2 diabetes mellitus (HCC)   6. Morbid obesity (HCC)   7. Hyperlipidemia LDL goal <100    PLAN:    In order of problems listed above:  Primary prevention stressed with the patient.  Importance of compliance with diet medication stressed and patient verbalized standing. Preop cardiovascular assessment: Dyspnea on exertion: Patient has multiple risk for coronary artery disease so we will do a CT coronary angiography with FFR for preop assessment. Mild aortic stenosis history:Cardiac murmur: Echocardiogram will be done to assess murmur heard on auscultation. Essential hypertension: Blood pressure is stable and diet was emphasized. Mixed dyslipidemia: On lipid-lowering medications followed by primary care.  Goal LDL less than 60. Diabetes mellitus and obesity: Weight reduction stressed diet emphasized.  Risks of obesity visited.  She promises to do better.Patient will be seen in follow-up appointment in 9 months or earlier if the patient has any concerns.    Medication Adjustments/Labs and Tests Ordered: Current medicines are reviewed at length with the patient today.  Concerns regarding medicines are outlined above.  Orders Placed This Encounter  Procedures   EKG 12-Lead   No orders of the defined types were placed in this encounter.    Chief Complaint  Patient presents with   Follow-up     History of Present Illness:    Amy Mahoney is a 73 y.o. female.  Patient has past medical history of essential hypertension, mixed dyslipidemia, diabetes mellitus and morbid obesity.  She  mentions to me that she has had foot problems and is contemplating surgery.  She has some dyspnea on exertion.  She has mild aortic stenosis history.  At the time of my evaluation, the patient is alert awake oriented and in no distress.  She denies any chest pain orthopnea PND.  Past Medical History:  Diagnosis Date   Abnormal esophagram    Abscess 04/15/2015   Abscess of skin 04/15/2015   Anxiety    Arthritis    Back pain    Bacterial vaginal infection 12/17/2014   Bronchitis with bronchospasm 04/14/2013   Chest heaviness 07/11/2020   Class 3 severe obesity with serious comorbidity and body mass index (BMI) of 40.0 to 44.9 in adult [facility]) 06/10/2018   Constipation    Cough 09/17/2013   DEPRESSION 09/05/2006   Qualifier: Diagnosis of  By: Antonio ROSALEA Jamee     Depression with anxiety 07/11/2020   Diabetes mellitus without complication (HCC)    DIZZINESS, CHRONIC 05/23/2006   Qualifier: Diagnosis of  By: Antonio ROSALEA Jamee     Dysphagia    FATIGUE 06/01/2009   Qualifier: Diagnosis of  By: Antonio ROSALEA Jamee     Gallbladder problem    Gastroesophageal reflux disease 05/24/2017   Heart murmur    Hyperlipidemia    Hyperlipidemia associated with type 2 diabetes mellitus (HCC) 07/11/2020   Hyperlipidemia LDL goal <100 11/06/2012   Hypothyroidism 05/23/2006   Qualifier: Diagnosis of  By: Antonio ROSALEA Jamee     Lichen planus 12/01/2020   Mild aortic stenosis 11/29/2021  MORBID OBESITY 03/30/2008   Qualifier: Diagnosis of  By: Antonio ROSALEA Rockers     Muscle spasm of back 11/19/2014   Obstructive sleep apnea 05/23/2006   NPSG 08/03/94 RDI/AHI 49/hr, weight was 285 pounds CPAP/ Advanced    Paronychia of index finger 08/08/2021   Pharyngitis 03/03/2017   Post-nasal drainage 08/03/2019   Last Assessment & Plan:  Formatting of this note might be different from the original. Concern over postnasal drainage. Chronic at least a year history of the above but over the last month seems to have worsened.   She has tried antihistamine therapy without improvement.  Denies obvious heartburn.  Some associated cough and occasional near emesis episodes.  She is not on an ACE inhibitor. 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   Subungual hematoma of fifth toe of right foot 10/29/2017   Swelling    Type 2 diabetes mellitus (HCC) 09/02/2020   Type 2 diabetes mellitus with hyperglycemia (HCC) 06/10/2018    Past Surgical History:  Procedure Laterality Date   CHOLECYSTECTOMY     DILATION AND CURETTAGE OF UTERUS     ESOPHAGOGASTRODUODENOSCOPY (EGD) WITH PROPOFOL  N/A 09/05/2017   Procedure: ESOPHAGOGASTRODUODENOSCOPY (EGD) WITH PROPOFOL ;  Surgeon: Albertus Gordy HERO, MD;  Location: [facility];  Service: Gastroenterology;  Laterality: N/A;   fibroids scrapping     LOOP RECORDER INSERTION N/A 06/20/2022   Procedure: LOOP RECORDER INSERTION;  Surgeon: Inocencio Soyla Lunger, MD;  Location: [facility];  Service: Cardiovascular;  Laterality: N/A;   TONSILLECTOMY      Current Medications: Current Meds  Medication Sig   aspirin  EC 81 MG tablet Take 1 tablet (81 mg total) by mouth daily. Swallow whole.   atorvastatin  (LIPITOR) 80 MG tablet TAKE 1 TABLET BY MOUTH ONCE  DAILY   Calcium  Carbonate-Vitamin D  (CALCIUM  500 + D PO) Take 1 tablet by mouth 2 (two) times daily.   clonazePAM  (KLONOPIN ) 0.5 MG tablet Take 1 tablet (0.5 mg total) by mouth daily.   fenofibrate  160 MG tablet TAKE 1 TABLET BY MOUTH DAILY   Insulin  Pen Needle (BD PEN NEEDLE NANO 2ND GEN) 32G X 4 MM MISC To inject Tresiba    levothyroxine  (SYNTHROID ) 112 MCG tablet Take 1 tablet (112 mcg total) by mouth daily before breakfast.   lisinopril  (ZESTRIL ) 5 MG tablet TAKE 1 TABLET BY MOUTH DAILY   Multiple Vitamin (MULTIVITAMIN) tablet Take 1 tablet by mouth daily.   omeprazole  (PRILOSEC) 40 MG capsule Take 40 mg by mouth daily.    ONETOUCH ULTRA test strip USE AS DIRECTED UP TO 4 TIMES DAILY   tirzepatide  (MOUNJARO ) 7.5 MG/0.5ML Pen Inject 7.5 mg into the skin once a week.   TRESIBA  FLEXTOUCH 100 UNIT/ML FlexTouch Pen INJECT SUBCUTANEOUSLY 10 UNITS  DAILY   venlafaxine  XR (EFFEXOR  XR) 37.5 MG 24 hr capsule Take 1 capsule (37.5 mg total) by mouth daily with breakfast.   venlafaxine  XR (EFFEXOR -XR) 150 MG 24 hr capsule Take 1 capsule (150 mg total) by mouth daily.   Vitamin D , Ergocalciferol , (DRISDOL ) 1.25 MG (50000 UNIT) CAPS capsule TAKE 1 CAPSULE BY MOUTH EVERY 7  DAYS     Allergies:   Patient has no known allergies.   Social History   Socioeconomic History   Marital status: Divorced    Spouse name: Not on file   Number of children: 0   Years of education:  Not on file   Highest education level: Bachelor's degree (e.g., BA, AB, BS)  Occupational History   Occupation: Eps Midwife: [employer]    Employer: [employer]   Occupation: RETIRED  Tobacco Use   Smoking status: Former    Current packs/day: 0.00    Average packs/day: 0.5 packs/day for 3.0 years (1.5 ttl pk-yrs)    Types: Cigarettes    Start date: 08/10/1987    Quit date: 08/10/1990    Years since quitting: 33.4   Smokeless tobacco: Never  Vaping Use   Vaping status: Never Used  Substance and Sexual Activity   Alcohol use: Not Currently    Comment: rarely   Drug use: No   Sexual activity: Not Currently    Partners: Male    Birth control/protection: Abstinence    Comment: too old  Other Topics Concern   Not on file  Social History Narrative   Regular exercise - yes-- 10,000 steps   Caffeine use: 2 cups of coffee daily   Live  in [place] with 34 year old father to assist in care. Will be moving in with brother and his wife into larger home to facilitate better care.    Social Drivers of Corporate Investment Banker Strain: Low Risk  (09/18/2023)   Overall Financial Resource Strain (CARDIA)    Difficulty of  Paying Living Expenses: Not hard at all  Food Insecurity: No Food Insecurity (09/18/2023)   Hunger Vital Sign    Worried About Running Out of Food in the Last Year: Never true    Ran Out of Food in the Last Year: Never true  Transportation Needs: No Transportation Needs (09/18/2023)   PRAPARE - Administrator, Civil Service (Medical): No    Lack of Transportation (Non-Medical): No  Physical Activity: Sufficiently Active (09/18/2023)   Exercise Vital Sign    Days of Exercise per Week: 4 days    Minutes of Exercise per Session: 60 min  Stress: No Stress Concern Present (09/18/2023)   [employer] of Occupational Health - Occupational Stress Questionnaire    Feeling of Stress: Not at all  Social Connections: Moderately Integrated (09/18/2023)   Social Connection and Isolation Panel    Frequency of Communication with Friends and Family: More than three times a week    Frequency of Social Gatherings with Friends and Family: More than three times a week    Attends Religious Services: More than 4 times per year    Active Member of [employer] or Organizations: Yes    Attends Engineer, Structural: More than 4 times per year    Marital Status: Divorced     Family History: The patient's family history includes Anxiety disorder in her brother; Arthritis in her mother and another family member; Breast cancer in her maternal aunt and paternal grandmother; Cancer in her maternal aunt and maternal aunt; Depression in her mother and another family member; Diabetes in her maternal aunt and mother; Hearing loss in her father; Hyperlipidemia in an other family member; Lung cancer in her maternal aunt; Thyroid  disease in her mother. There is no history of Colon cancer, Pancreatic cancer, Stomach cancer, Esophageal cancer, Rectal cancer, or Stroke.  ROS:   Please see the history of present illness.    All other systems reviewed and are negative.  EKGs/Labs/Other Studies Reviewed:    The  following studies were reviewed today: .SABRAEKG Interpretation Date/Time:  Wednesday January 29 2024 13:24:38 EST Ventricular Rate:  83 PR Interval:  132 QRS Duration:  68 QT Interval:  346 QTC Calculation: 406 R Axis:   70  Text Interpretation: Sinus rhythm with Premature supraventricular complexes Cannot rule out Anterior infarct , age undetermined When compared with ECG of 19-Jun-2022 10:14, PREVIOUS ECG IS PRESENT Confirmed by Edwyna Backers [phone]) on 01/29/2024 1:34:37 PM     Recent Labs: 09/24/2023: ALT 21; BUN 26; Creatinine, Ser 0.89; Hemoglobin 13.2; Platelets 327.0; Potassium 4.5; Sodium 140; TSH 0.18  Recent Lipid Panel    Component Value Date/Time   CHOL 117 09/24/2023 1451   CHOL 115 04/14/2018 1019   TRIG 138.0 09/24/2023 1451   HDL 39.50 09/24/2023 1451   HDL 40 04/14/2018 1019   CHOLHDL 3 09/24/2023 1451   VLDL 27.6 09/24/2023 1451   LDLCALC 50 09/24/2023 1451   LDLCALC 60 04/14/2018 1019   LDLDIRECT 193.3 11/06/2012 0926    Physical Exam:    VS:  BP 126/82 (BP Location: Left Arm, Patient Position: Sitting, Cuff Size: Normal)   Pulse 83   Resp 18   Ht 5' 5 (1.651 m)   Wt 232 lb 12 oz (105.6 kg)   SpO2 96%   BMI 38.73 kg/m     Wt Readings from Last 3 Encounters:  01/29/24 232 lb 12 oz (105.6 kg)  12/10/23 237 lb 12.8 oz (107.9 kg)  09/24/23 233 lb 12.8 oz (106.1 kg)     GEN: Patient is in no acute distress HEENT: Normal NECK: No JVD; No carotid bruits LYMPHATICS: No lymphadenopathy CARDIAC: Hear sounds regular, 2/6 systolic murmur at the apex. RESPIRATORY:  Clear to auscultation without rales, wheezing or rhonchi  ABDOMEN: Soft, non-tender, non-distended MUSCULOSKELETAL:  No edema; No deformity  SKIN: Warm and dry NEUROLOGIC:  Alert and oriented x 3 PSYCHIATRIC:  Normal affect   Signed, Backers JONELLE Edwyna, MD  01/29/2024 1:47 PM    [facility]

## 2024-01-30 ENCOUNTER — Ambulatory Visit: Payer: Self-pay | Admitting: Cardiology

## 2024-01-30 LAB — BASIC METABOLIC PANEL WITH GFR
BUN/Creatinine Ratio: 19 (ref 12–28)
BUN: 17 mg/dL (ref 8–27)
CO2: 24 mmol/L (ref 20–29)
Calcium: 9.5 mg/dL (ref 8.7–10.3)
Chloride: 107 mmol/L — ABNORMAL HIGH (ref 96–106)
Creatinine, Ser: 0.88 mg/dL (ref 0.57–1.00)
Glucose: 133 mg/dL — ABNORMAL HIGH (ref 70–99)
Potassium: 4.3 mmol/L (ref 3.5–5.2)
Sodium: 143 mmol/L (ref 134–144)
eGFR: 69 mL/min/1.73 (ref >59)

## 2024-02-07 ENCOUNTER — Encounter (HOSPITAL_COMMUNITY): Payer: Self-pay

## 2024-02-11 ENCOUNTER — Ambulatory Visit (HOSPITAL_BASED_OUTPATIENT_CLINIC_OR_DEPARTMENT_OTHER)
Admission: RE | Admit: 2024-02-11 | Discharge: 2024-02-11 | Disposition: A | Discharge status: Home / Self Care | Source: Ambulatory Visit | Attending: Cardiology | Admitting: Cardiology

## 2024-02-11 DIAGNOSIS — I2510 Atherosclerotic heart disease of native coronary artery without angina pectoris: Secondary | ICD-10-CM | POA: Diagnosis not present

## 2024-02-11 DIAGNOSIS — R0609 Other forms of dyspnea: Secondary | ICD-10-CM | POA: Diagnosis present

## 2024-02-11 DIAGNOSIS — Z01810 Encounter for preprocedural cardiovascular examination: Secondary | ICD-10-CM | POA: Insufficient documentation

## 2024-02-11 MED ORDER — NITROGLYCERIN 0.4 MG SL SUBL: 0.8000 mg | SUBLINGUAL_TABLET | Freq: Once | SUBLINGUAL | Status: AC

## 2024-02-11 MED ORDER — IOHEXOL 350 MG/ML SOLN
100.0000 mL | Freq: Once | INTRAVENOUS | Status: AC | PRN
  Administered 2024-02-11: 95 mL via INTRAVENOUS

## 2024-02-11 MED ORDER — NITROGLYCERIN 0.4 MG SL SUBL
SUBLINGUAL_TABLET | SUBLINGUAL | Status: AC
  Administered 2024-02-11: 0.8 mg via SUBLINGUAL
  Filled 2024-02-11: qty 16

## 2024-02-12 ENCOUNTER — Inpatient Hospital Stay (INDEPENDENT_AMBULATORY_CARE_PROVIDER_SITE_OTHER)
Admission: RE | Admit: 2024-02-12 | Discharge: 2024-02-12 | Disposition: A | Source: Ambulatory Visit | Attending: Cardiology | Admitting: Cardiology

## 2024-02-12 ENCOUNTER — Other Ambulatory Visit: Payer: Self-pay | Admitting: Cardiology

## 2024-02-12 DIAGNOSIS — R931 Abnormal findings on diagnostic imaging of heart and coronary circulation: Secondary | ICD-10-CM

## 2024-02-12 NOTE — Progress Notes (Signed)
 FFR Order

## 2024-02-13 ENCOUNTER — Ambulatory Visit

## 2024-02-13 DIAGNOSIS — I639 Cerebral infarction, unspecified: Secondary | ICD-10-CM

## 2024-02-13 LAB — CUP PACEART REMOTE DEVICE CHECK
Date Time Interrogation Session: 20251224233240
Implantable Pulse Generator Implant Date: 20240501

## 2024-02-14 NOTE — Progress Notes (Signed)
 Remote Loop Recorder Transmission

## 2024-02-17 MED ORDER — NITROGLYCERIN 0.4 MG SL SUBL: 0.4000 mg | SUBLINGUAL_TABLET | SUBLINGUAL | 6 refills | Status: AC | PRN

## 2024-02-18 ENCOUNTER — Ambulatory Visit: Payer: Self-pay | Admitting: Cardiology

## 2024-02-22 ENCOUNTER — Ambulatory Visit: Payer: Self-pay | Admitting: Cardiology

## 2024-02-25 ENCOUNTER — Ambulatory Visit (HOSPITAL_BASED_OUTPATIENT_CLINIC_OR_DEPARTMENT_OTHER)
Admission: RE | Admit: 2024-02-25 | Discharge: 2024-02-25 | Disposition: A | Discharge status: Home / Self Care | Source: Ambulatory Visit | Attending: Cardiology | Admitting: Cardiology

## 2024-02-25 DIAGNOSIS — Z01810 Encounter for preprocedural cardiovascular examination: Secondary | ICD-10-CM | POA: Insufficient documentation

## 2024-02-25 DIAGNOSIS — I350 Nonrheumatic aortic (valve) stenosis: Secondary | ICD-10-CM | POA: Diagnosis present

## 2024-02-25 LAB — ECHOCARDIOGRAM COMPLETE
AR max vel: 1.37 cm2
AV Area VTI: 1.44 cm2
AV Area mean vel: 1.4 cm2
AV Mean grad: 13.8 mmHg
AV Peak grad: 22.9 mmHg
Ao pk vel: 2.39 m/s
Area-P 1/2: 2.74 cm2
Calc EF: 67.9 %
S' Lateral: 2.4 cm
Single Plane A2C EF: 64.6 %
Single Plane A4C EF: 72.4 %

## 2024-02-26 ENCOUNTER — Other Ambulatory Visit: Payer: Self-pay | Admitting: Family Medicine

## 2024-02-26 DIAGNOSIS — E1165 Type 2 diabetes mellitus with hyperglycemia: Secondary | ICD-10-CM

## 2024-03-03 ENCOUNTER — Telehealth: Payer: Self-pay

## 2024-03-03 NOTE — Telephone Encounter (Signed)
"  ° °  Patient Name: Amy Mahoney  DOB: April 02, 1950 MRN: [id number]  Primary Cardiologist: Jennifer JONELLE Crape, MD  Chart reviewed as part of pre-operative protocol coverage. Given past medical history and time since last visit, based on ACC/AHA guidelines, Amy Mahoney is at acceptable risk for the planned procedure without further cardiovascular testing.   If she develops new symptoms prior to surgery, she should contact our office to arrange for a follow-up visit.  Pharmacy clearance not requested, but regarding ASA therapy, we recommend continuation of ASA throughout the perioperative period.  However, if the surgeon feels that cessation of ASA is required in the perioperative period, it may be stopped 5-7 days prior to surgery with a plan to resume it as soon as felt to be feasible from a surgical standpoint in the post-operative period.  I will route this recommendation to the requesting party via Epic fax function and remove from pre-op pool.  Please call with questions.  Saddie GORMAN Cleaves, NP 03/03/2024, 9:40 AM  "

## 2024-03-03 NOTE — Telephone Encounter (Signed)
"  ° °  Pre-operative Risk Assessment    Patient Name: Amy Mahoney  DOB: [birth year] MRN: [id number]   Date of last office visit: 01/29/2024 Date of next office visit: 03/26/026   Request for Surgical Clearance    Procedure:  Left anterior and posterior tibialis tendon debridement with possible   Date of Surgery:  Clearance TBD                                Surgeon:  Dareen Surgeon's Group or Practice Name:  Dr. Lillia kingdom Phone number:  [phone] Fax number:  [phone]   Type of Clearance Requested:   - Medical    Type of Anesthesia:  General    Additional requests/questions:    Bonney Calvert Pouch   03/03/2024, 9:09 AM   "

## 2024-03-15 ENCOUNTER — Ambulatory Visit: Attending: Cardiology

## 2024-03-15 DIAGNOSIS — I639 Cerebral infarction, unspecified: Secondary | ICD-10-CM | POA: Diagnosis not present

## 2024-03-16 LAB — CUP PACEART REMOTE DEVICE CHECK
Date Time Interrogation Session: 20260124233803
Implantable Pulse Generator Implant Date: 20240501

## 2024-03-17 ENCOUNTER — Other Ambulatory Visit: Payer: Self-pay | Admitting: Family Medicine

## 2024-03-17 ENCOUNTER — Ambulatory Visit: Payer: Self-pay | Admitting: Cardiology

## 2024-03-17 DIAGNOSIS — E1165 Type 2 diabetes mellitus with hyperglycemia: Secondary | ICD-10-CM

## 2024-03-19 NOTE — Progress Notes (Signed)
 Remote Loop Recorder Transmission

## 2024-04-15 ENCOUNTER — Ambulatory Visit

## 2024-04-29 ENCOUNTER — Ambulatory Visit: Admitting: Behavioral Health

## 2024-05-14 ENCOUNTER — Ambulatory Visit: Admitting: Cardiology

## 2024-05-16 ENCOUNTER — Ambulatory Visit

## 2024-07-16 ENCOUNTER — Ambulatory Visit
